# Patient Record
Sex: Female | Born: 1945
Health system: Southern US, Community
[De-identification: ages and names within clinical notes are randomized; demographics above are authoritative.]

## PROBLEM LIST (undated history)

## (undated) DIAGNOSIS — D229 Melanocytic nevi, unspecified: Secondary | ICD-10-CM

## (undated) DIAGNOSIS — M1990 Unspecified osteoarthritis, unspecified site: Secondary | ICD-10-CM

## (undated) DIAGNOSIS — E785 Hyperlipidemia, unspecified: Secondary | ICD-10-CM

## (undated) DIAGNOSIS — J302 Other seasonal allergic rhinitis: Secondary | ICD-10-CM

## (undated) DIAGNOSIS — F329 Major depressive disorder, single episode, unspecified: Secondary | ICD-10-CM

## (undated) DIAGNOSIS — I10 Essential (primary) hypertension: Secondary | ICD-10-CM

## (undated) DIAGNOSIS — F32A Depression, unspecified: Secondary | ICD-10-CM

## (undated) DIAGNOSIS — K219 Gastro-esophageal reflux disease without esophagitis: Secondary | ICD-10-CM

## (undated) DIAGNOSIS — M51369 Other intervertebral disc degeneration, lumbar region without mention of lumbar back pain or lower extremity pain: Secondary | ICD-10-CM

## (undated) DIAGNOSIS — Z0000 Encounter for general adult medical examination without abnormal findings: Secondary | ICD-10-CM

## (undated) DIAGNOSIS — Z8619 Personal history of other infectious and parasitic diseases: Secondary | ICD-10-CM

## (undated) DIAGNOSIS — M8580 Other specified disorders of bone density and structure, unspecified site: Secondary | ICD-10-CM

## (undated) DIAGNOSIS — Z86018 Personal history of other benign neoplasm: Secondary | ICD-10-CM

## (undated) DIAGNOSIS — R32 Unspecified urinary incontinence: Secondary | ICD-10-CM

## (undated) DIAGNOSIS — I2510 Atherosclerotic heart disease of native coronary artery without angina pectoris: Secondary | ICD-10-CM

## (undated) DIAGNOSIS — S3210XA Unspecified fracture of sacrum, initial encounter for closed fracture: Secondary | ICD-10-CM

## (undated) DIAGNOSIS — E782 Mixed hyperlipidemia: Secondary | ICD-10-CM

## (undated) DIAGNOSIS — N8110 Cystocele, unspecified: Secondary | ICD-10-CM

## (undated) DIAGNOSIS — B353 Tinea pedis: Secondary | ICD-10-CM

## (undated) DIAGNOSIS — Z8669 Personal history of other diseases of the nervous system and sense organs: Secondary | ICD-10-CM

## (undated) DIAGNOSIS — M25552 Pain in left hip: Secondary | ICD-10-CM

## (undated) DIAGNOSIS — E1169 Type 2 diabetes mellitus with other specified complication: Secondary | ICD-10-CM

## (undated) DIAGNOSIS — E669 Obesity, unspecified: Secondary | ICD-10-CM

## (undated) DIAGNOSIS — N993 Prolapse of vaginal vault after hysterectomy: Secondary | ICD-10-CM

## (undated) DIAGNOSIS — M5136 Other intervertebral disc degeneration, lumbar region: Secondary | ICD-10-CM

## (undated) HISTORY — DX: Gastro-esophageal reflux disease without esophagitis: K21.9

## (undated) HISTORY — DX: Pain in left hip: M25.552

## (undated) HISTORY — DX: Essential (primary) hypertension: I10

## (undated) HISTORY — DX: Encounter for general adult medical examination without abnormal findings: Z00.00

## (undated) HISTORY — DX: Other specified disorders of bone density and structure, unspecified site: M85.80

## (undated) HISTORY — DX: Hyperlipidemia, unspecified: E78.5

## (undated) HISTORY — DX: Tinea pedis: B35.3

## (undated) HISTORY — DX: Obesity, unspecified: E66.9

## (undated) HISTORY — DX: Depression, unspecified: F32.A

## (undated) HISTORY — DX: Other seasonal allergic rhinitis: J30.2

## (undated) HISTORY — DX: Major depressive disorder, single episode, unspecified: F32.9

## (undated) HISTORY — DX: Personal history of other infectious and parasitic diseases: Z86.19

## (undated) HISTORY — PX: BLADDER REPAIR: SHX76

## (undated) HISTORY — DX: Melanocytic nevi, unspecified: D22.9

## (undated) HISTORY — DX: Unspecified urinary incontinence: R32

## (undated) HISTORY — DX: Unspecified fracture of sacrum, initial encounter for closed fracture: S32.10XA

## (undated) HISTORY — DX: Type 2 diabetes mellitus with other specified complication: E11.69

## (undated) HISTORY — PX: TOTAL ABDOMINAL HYSTERECTOMY: SHX209

---

## 2000-05-12 LAB — HM COLONOSCOPY: HM Colonoscopy: NORMAL

## 2000-06-09 LAB — HM PAP SMEAR: HM Pap smear: NORMAL

## 2002-03-04 HISTORY — PX: TOTAL VAGINAL HYSTERECTOMY: SHX2548

## 2010-10-17 ENCOUNTER — Encounter: Payer: Self-pay | Admitting: Internal Medicine

## 2010-10-17 ENCOUNTER — Ambulatory Visit (INDEPENDENT_AMBULATORY_CARE_PROVIDER_SITE_OTHER): Payer: Medicare Other | Admitting: Internal Medicine

## 2010-10-17 ENCOUNTER — Ambulatory Visit: Payer: Self-pay | Admitting: Internal Medicine

## 2010-10-17 DIAGNOSIS — IMO0001 Reserved for inherently not codable concepts without codable children: Secondary | ICD-10-CM

## 2010-10-17 DIAGNOSIS — F411 Generalized anxiety disorder: Secondary | ICD-10-CM

## 2010-10-17 DIAGNOSIS — R635 Abnormal weight gain: Secondary | ICD-10-CM

## 2010-10-17 DIAGNOSIS — E669 Obesity, unspecified: Secondary | ICD-10-CM | POA: Insufficient documentation

## 2010-10-17 DIAGNOSIS — F419 Anxiety disorder, unspecified: Secondary | ICD-10-CM

## 2010-10-17 DIAGNOSIS — I10 Essential (primary) hypertension: Secondary | ICD-10-CM

## 2010-10-17 DIAGNOSIS — E785 Hyperlipidemia, unspecified: Secondary | ICD-10-CM

## 2010-10-17 DIAGNOSIS — N3289 Other specified disorders of bladder: Secondary | ICD-10-CM

## 2010-10-17 DIAGNOSIS — M791 Myalgia, unspecified site: Secondary | ICD-10-CM

## 2010-10-17 DIAGNOSIS — Z79899 Other long term (current) drug therapy: Secondary | ICD-10-CM

## 2010-10-17 DIAGNOSIS — Z1239 Encounter for other screening for malignant neoplasm of breast: Secondary | ICD-10-CM

## 2010-10-17 HISTORY — DX: Anxiety disorder, unspecified: F41.9

## 2010-10-17 HISTORY — DX: Obesity, unspecified: E66.9

## 2010-10-17 LAB — BASIC METABOLIC PANEL
CO2: 24 mEq/L (ref 19–32)
Chloride: 104 mEq/L (ref 96–112)
Creat: 0.73 mg/dL (ref 0.50–1.10)
Potassium: 4.3 mEq/L (ref 3.5–5.3)

## 2010-10-17 LAB — HEPATIC FUNCTION PANEL
Albumin: 4.3 g/dL (ref 3.5–5.2)
Indirect Bilirubin: 0.2 mg/dL (ref 0.0–0.9)
Total Protein: 7.2 g/dL (ref 6.0–8.3)

## 2010-10-17 LAB — URINALYSIS
Bilirubin Urine: NEGATIVE
Leukocytes, UA: NEGATIVE
Nitrite: NEGATIVE
Protein, ur: NEGATIVE mg/dL
Specific Gravity, Urine: 1.021 (ref 1.005–1.030)
Urobilinogen, UA: 0.2 mg/dL (ref 0.0–1.0)

## 2010-10-17 LAB — CBC
HCT: 41.8 % (ref 36.0–46.0)
MCV: 88.9 fL (ref 78.0–100.0)
Platelets: 328 10*3/uL (ref 150–400)
RBC: 4.7 MIL/uL (ref 3.87–5.11)
WBC: 3.8 10*3/uL — ABNORMAL LOW (ref 4.0–10.5)

## 2010-10-17 LAB — LIPID PANEL
LDL Cholesterol: 121 mg/dL — ABNORMAL HIGH (ref 0–99)
Total CHOL/HDL Ratio: 3 Ratio
Triglycerides: 78 mg/dL (ref <150)
VLDL: 16 mg/dL (ref 0–40)

## 2010-10-17 LAB — TSH: TSH: 1.001 u[IU]/mL (ref 0.350–4.500)

## 2010-10-17 MED ORDER — LOSARTAN POTASSIUM 50 MG PO TABS: 50.0000 mg | ORAL_TABLET | Freq: Every day | ORAL | Status: DC

## 2010-10-17 MED ORDER — FLUOXETINE HCL 40 MG PO CAPS: 40.0000 mg | ORAL_CAPSULE | Freq: Every day | ORAL | Status: DC

## 2010-10-17 NOTE — Assessment & Plan Note (Signed)
Change benicar to losartan for cost consideration. Maintain outpt blood pressure log. Obtain cbc,chem7.

## 2010-10-17 NOTE — Assessment & Plan Note (Signed)
Urology consult

## 2010-10-17 NOTE — Assessment & Plan Note (Signed)
Obtain lipid/lft. Due to myalgias obtain ck and hold statin for 1-2 weeks to determine if sx's related.

## 2010-10-17 NOTE — Assessment & Plan Note (Signed)
suboptimal control. Increase prozac 40mg  po qd.

## 2010-10-17 NOTE — Progress Notes (Signed)
  Subjective:    Patient ID: Brenda [name], female    DOB: [birth year], 65 y.o.   MRN: [id number]  HPI Pt presents to clinic to establish primary care and for followup of multiple medical problems. H/o HTN typically well controlled. Finds benicar expensive. S/p bladder tack at the time of hysterectomy and notes chronic bladder pressure and discomfort. Wonders if bladder has fallen but notes no prolapse. H/o depression/anxiety maintained on low dose prozac without side effects. Notes somewhat regular anxiety despite medication. Compliant with statin therapy however notes leg myalgias and cramping. +unintended weight gain. GERD sx's occur primarily with food triggers and takes rare zantac prn. No other complaints.  Reviewed pmh, psh, medications, allergies, soc hx and fam hx.    Review of Systems  Constitutional: Positive for unexpected weight change. Negative for fatigue.  Eyes: Negative for discharge and visual disturbance.  Respiratory: Negative for cough and shortness of breath.   Cardiovascular: Negative for chest pain.  Gastrointestinal: Negative for abdominal pain.  Genitourinary: Negative for dysuria, difficulty urinating and pelvic pain.  Musculoskeletal: Positive for myalgias.  Skin: Negative for color change and rash.  Neurological: Negative for seizures and speech difficulty.  Psychiatric/Behavioral: The patient is nervous/anxious.        Objective:   Physical Exam  Nursing note and vitals reviewed. Constitutional: She appears well-developed and well-nourished. No distress.  HENT:  Head: Normocephalic and atraumatic.  Right Ear: External ear normal.  Left Ear: External ear normal.  Nose: Nose normal.  Mouth/Throat: Oropharynx is clear and moist. No oropharyngeal exudate.  Eyes: Conjunctivae are normal. Right eye exhibits no discharge. Left eye exhibits no discharge. No scleral icterus.  Neck: Neck supple.  Cardiovascular: Normal rate, regular rhythm and normal heart sounds.   Exam reveals no gallop and no friction rub.   No murmur heard. Pulmonary/Chest: Effort normal and breath sounds normal. No respiratory distress. She has no wheezes. She has no rales.  Lymphadenopathy:    She has no cervical adenopathy.  Neurological: She is alert.  Skin: Skin is warm and dry. She is not diaphoretic.  Psychiatric: She has a normal mood and affect.          Assessment & Plan:

## 2010-10-17 NOTE — Assessment & Plan Note (Signed)
Obtain tsh  

## 2010-11-21 ENCOUNTER — Encounter: Payer: Self-pay | Admitting: Internal Medicine

## 2010-11-21 LAB — HM MAMMOGRAPHY

## 2010-11-29 ENCOUNTER — Encounter: Payer: Self-pay | Admitting: Internal Medicine

## 2011-01-16 ENCOUNTER — Encounter: Payer: Self-pay | Admitting: Internal Medicine

## 2011-01-16 ENCOUNTER — Ambulatory Visit (INDEPENDENT_AMBULATORY_CARE_PROVIDER_SITE_OTHER): Payer: Medicare Other | Admitting: Internal Medicine

## 2011-01-16 VITALS — BP 130/80 | HR 83 | Temp 98.1°F | Resp 18 | Ht 68.0 in | Wt 180.0 lb

## 2011-01-16 DIAGNOSIS — E785 Hyperlipidemia, unspecified: Secondary | ICD-10-CM

## 2011-01-16 DIAGNOSIS — Z23 Encounter for immunization: Secondary | ICD-10-CM

## 2011-01-16 DIAGNOSIS — I10 Essential (primary) hypertension: Secondary | ICD-10-CM

## 2011-01-16 NOTE — Progress Notes (Signed)
  Subjective:    Patient ID: Brenda [name], female    DOB: 16-Mar-1945, 65 y.o.   MRN: [id number]  HPI Pt presents to clinic for followup of multiple medical problems. BP reviewed as normotensive. Tolerates statin tx without myalgias. S/p urology for evaluation of cystocele. Attempting to avoid surgery and is improved. Wt down 3lbs and attempting to lose further.  Past Medical History  Diagnosis Date  . History of chicken pox     childhood age 15  . Depression     counseling  . GERD (gastroesophageal reflux disease)   . Seasonal allergies   . Hypertension   . Hyperlipidemia     2011   Past Surgical History  Procedure Date  . Total abdominal hysterectomy     2002  . Bladder repair     2002    reports that she has quit smoking. She has never used smokeless tobacco. She reports that she does not drink alcohol or use illicit drugs. family history includes Breast cancer (age of onset:40) in her sister; Colon cancer (age of onset:50) in her sister; Heart disease (age of onset:32) in her father; and Hypertension in her father. No Known Allergies   Review of Systems see hpi     Objective:   Physical Exam  Physical Exam  Nursing note and vitals reviewed. Constitutional: Appears well-developed and well-nourished. No distress.  HENT:  Head: Normocephalic and atraumatic.  Right Ear: External ear normal.  Left Ear: External ear normal.  Eyes: Conjunctivae are normal. No scleral icterus.  Neck: Neck supple. Carotid bruit is not present.  Cardiovascular: Normal rate, regular rhythm and normal heart sounds.  Exam reveals no gallop and no friction rub.   No murmur heard. Pulmonary/Chest: Effort normal and breath sounds normal. No respiratory distress. He has no wheezes. no rales.  Lymphadenopathy:    He has no cervical adenopathy.  Neurological:Alert.  Skin: Skin is warm and dry. Not diaphoretic.  Psychiatric: Has a normal mood and affect.        Assessment & Plan:

## 2011-01-16 NOTE — Assessment & Plan Note (Signed)
Stable. Obtain lipid/lft prior to next visit. Continue statin tx.

## 2011-01-16 NOTE — Patient Instructions (Signed)
Please schedule [id number] and lipid/lft 272.4 prior to next visit 

## 2011-01-16 NOTE — Assessment & Plan Note (Signed)
Normotensive and stable. Continue current regimen. Monitor bp as outpt and followup in clinic as scheduled.  

## 2011-01-17 ENCOUNTER — Encounter: Payer: Self-pay | Admitting: Internal Medicine

## 2011-01-23 ENCOUNTER — Telehealth: Payer: Self-pay | Admitting: Internal Medicine

## 2011-01-23 MED ORDER — HYDROCHLOROTHIAZIDE 25 MG PO TABS: 25.0000 mg | ORAL_TABLET | Freq: Every day | ORAL | Status: DC

## 2011-01-23 NOTE — Telephone Encounter (Signed)
Refill- hydrochlorothi 25mg  tab. Take one tablet by mouth every day. Qty 30. Last fill 10.26.12

## 2011-01-23 NOTE — Telephone Encounter (Signed)
Rx refill sent to pharmacy. 

## 2011-02-11 ENCOUNTER — Telehealth: Payer: Self-pay | Admitting: *Deleted

## 2011-02-11 MED ORDER — PRAVASTATIN SODIUM 20 MG PO TABS: 20.0000 mg | ORAL_TABLET | Freq: Every day | ORAL | Status: DC

## 2011-02-11 NOTE — Telephone Encounter (Signed)
Received message from pt stating pharmacy received denial from [place] for pravastatin and pt is wanting to know why? Requests refill to [facility]. After review of chart I do not see a request from [facility]. Refill has been sent to pharmacy and message was left for pt to return my call.

## 2011-02-12 NOTE — Telephone Encounter (Signed)
Pt.notified

## 2011-04-09 ENCOUNTER — Telehealth: Payer: Self-pay | Admitting: *Deleted

## 2011-04-09 DIAGNOSIS — Z79899 Other long term (current) drug therapy: Secondary | ICD-10-CM

## 2011-04-09 DIAGNOSIS — E785 Hyperlipidemia, unspecified: Secondary | ICD-10-CM

## 2011-04-09 LAB — BASIC METABOLIC PANEL
BUN: 26 mg/dL — ABNORMAL HIGH (ref 6–23)
Calcium: 10.1 mg/dL (ref 8.4–10.5)
Creat: 0.85 mg/dL (ref 0.50–1.10)
Glucose, Bld: 91 mg/dL (ref 70–99)

## 2011-04-09 LAB — HEPATIC FUNCTION PANEL
ALT: 12 U/L (ref 0–35)
AST: 16 U/L (ref 0–37)
Alkaline Phosphatase: 62 U/L (ref 39–117)
Bilirubin, Direct: 0.1 mg/dL (ref 0.0–0.3)
Indirect Bilirubin: 0.4 mg/dL (ref 0.0–0.9)

## 2011-04-09 LAB — LIPID PANEL
Cholesterol: 194 mg/dL (ref 0–200)
Triglycerides: 49 mg/dL (ref <150)

## 2011-04-09 NOTE — Telephone Encounter (Signed)
Pt presented to the lab for blood work. Lab orders placed and forwarded to the lab per office note of 01/2011.

## 2011-04-17 ENCOUNTER — Ambulatory Visit (INDEPENDENT_AMBULATORY_CARE_PROVIDER_SITE_OTHER): Payer: Medicare Other | Admitting: Internal Medicine

## 2011-04-17 ENCOUNTER — Telehealth: Payer: Self-pay | Admitting: Internal Medicine

## 2011-04-17 ENCOUNTER — Encounter: Payer: Self-pay | Admitting: Internal Medicine

## 2011-04-17 VITALS — BP 130/80 | HR 67 | Temp 98.1°F | Resp 18 | Ht 68.0 in | Wt 188.0 lb

## 2011-04-17 DIAGNOSIS — Z1211 Encounter for screening for malignant neoplasm of colon: Secondary | ICD-10-CM

## 2011-04-17 DIAGNOSIS — I10 Essential (primary) hypertension: Secondary | ICD-10-CM

## 2011-04-17 DIAGNOSIS — E785 Hyperlipidemia, unspecified: Secondary | ICD-10-CM

## 2011-04-17 DIAGNOSIS — Z23 Encounter for immunization: Secondary | ICD-10-CM

## 2011-04-17 HISTORY — DX: Encounter for screening for malignant neoplasm of colon: Z12.11

## 2011-04-17 NOTE — Progress Notes (Signed)
  Subjective:    Patient ID: Brenda [name], female    DOB: 1945-05-23, 66 y.o.   MRN: [id number]  HPI Pt presents to clinic for followup of multiple medical problems. Last colonoscopy 2002 reviewed. BP reviewed normotensive. Tolerating statin tx without myalgias or abn lfts. Needs tetanus booster. No active complaint.  Past Medical History  Diagnosis Date  . History of chicken pox     childhood age 1  . Depression     counseling  . GERD (gastroesophageal reflux disease)   . Seasonal allergies   . Hypertension   . Hyperlipidemia     2011   Past Surgical History  Procedure Date  . Total abdominal hysterectomy     2002  . Bladder repair     2002    reports that she has quit smoking. She has never used smokeless tobacco. She reports that she does not drink alcohol or use illicit drugs. family history includes Breast cancer (age of onset:40) in her sister; Colon cancer (age of onset:50) in her sister; Heart disease (age of onset:32) in her father; and Hypertension in her father. No Known Allergies    Review of Systems see hpi     Objective:   Physical Exam  Physical Exam  Nursing note and vitals reviewed. Constitutional: Appears well-developed and well-nourished. No distress.  HENT:  Head: Normocephalic and atraumatic.  Right Ear: External ear normal.  Left Ear: External ear normal.  Eyes: Conjunctivae are normal. No scleral icterus.  Neck: Neck supple. Carotid bruit is not present.  Cardiovascular: Normal rate, regular rhythm and normal heart sounds.  Exam reveals no gallop and no friction rub.   No murmur heard. Pulmonary/Chest: Effort normal and breath sounds normal. No respiratory distress. He has no wheezes. no rales.  Lymphadenopathy:    He has no cervical adenopathy.  Neurological:Alert.  Skin: Skin is warm and dry. Not diaphoretic.  Psychiatric: Has a normal mood and affect.        Assessment & Plan:

## 2011-04-17 NOTE — Assessment & Plan Note (Signed)
Normotensive and stable. Continue current regimen. Monitor bp as outpt and followup in clinic as scheduled.  

## 2011-04-17 NOTE — Assessment & Plan Note (Signed)
GI referral for f/u colonoscopy

## 2011-04-17 NOTE — Assessment & Plan Note (Signed)
Stable. Continue current statin dosing.  

## 2011-04-17 NOTE — Patient Instructions (Signed)
Please schedule [id number] and lipid/lft 272.4 prior to next visit 

## 2011-04-18 NOTE — Patient Instructions (Addendum)
____________________________________________________________________________________________________________________________________________________________   Brenda [name] need to arrive at the 4th Floor [facility] at aaaa on ccccc, xxxxx for your procedure(s). That is one hour before the procedure is scheduled to start to allow ample time for check in   The following packet contains information regarding your prep instructions, what to expect on the day of your procedure, follow up, cancellation policy, finances of your procedure, rights and responsibilities as a patient, complaint/grievance process, advance directive,  and a list of frequently asked questions.  ________________________________________________________________________________________________________________________________________________________________________________________________  MOVIPREP INSTRUCTIONS  THIS IS A SPLIT-DOSE PREP YOU WILL NOT BE DRINKING ALL THE PREP AT SAME TIME   FOLLOW THESE INSTRUCTIONS, NOT INSTRUCTIONS ON MOVIPREP BOX.    STARTING FIVE DAYS BEFORE YOUR PROCEDURE (sss, ssss) Do not eat nuts, seeds, popcorn, corn, beans, peas, salads, or any raw vegetables. Do not take any fiber supplements (e.g. Metamucil, Citrucel, and Benefiber).   THE DAY BEFORE YOUR PROCEDURE (xxxx, xx) In the morning, mix the first half of the prep solution  Empty 1 Pouch A and 1 Pouch B into the disposable container.  Add lukewarm drinking water to the top line of the container. Mix to dissolve.  Refrigerate (mixed solution should be used within 24 hours)  Drink clear liquids the entire day - YOU SHOULD NOT EAT ANY SOLID FOOD.  Do not drink anything colored red or purple. Avoid juices with pulp. No orange juice.  Drink at least 64 oz. (8 glasses) of fluid/clear liquids during the day to prevent dehydration and help the prep work best.  Clear liquids include: water, ice, tea/coffee (sugar is ok, but no milk or  cream), juice (apple, white grape, white cranberry), clear bullion, consomme, broth, strained chicken noodle soup, jello, popsicles, powdered fruit flavored drinks, gatorade, lemonade, carbonated beverages, hard candy.  Start drinking the first half of the prep solution at 5pm. The MoviPrep container is divided by 4 marks. Every 15 minutes drink the solution down to the next mark (approximately 8 oz) until the full liter is completely gone.  After you drink the prep solution, wait another 15 minutes and then drink 16 oz clear liquid of your choice (nothing red or purple). Continue to drink clear liquids as needed until bedtime.  Before going to bed, mix the second half dose of prep solution  Empty 1 Pouch A and 1 Pouch B into the disposable container.  Add lukewarm drinking water to the top line of the container. Mix to dissolve.  Refrigerate   THE DAY OF YOUR PROCEDURE (xx, xx) Start drinking the second half of the prep at  xx (5 hours prior to the start of your procedure). Every 15 minutes drink the solution down to the next mark (approximately 8 oz) until the full liter is completely gone.  After you drink the prep solution, wait another 15 minutes and then drink 16 oz clear liquid of your choice (nothing red or purple). You may drink clear liquids until cc (which is 2 hours prior to start of your procedure).     ________________________________________________________________________________________________________________________________________________________________________________________________________________________   OTHER PROCEDURE INSTRUCTIONS  MEDICATIONS    Unless you are otherwise instructed, you should take your regular prescription medications with a small sip of water as early as possibly on the morning of your procedure. Diabetes medications: cc Blood thinning, anticoagulant medications: cc Additional medications: cc  CARE PARTNER   You will need a responsible  adult at least 66 years of age to act as your care partner on the day of  your procedure. This person needs to arrive with you to the facility, stay there during your procedure and then drive you home afterwards.  We cannot start your procedure unless your care partner is present in our facility. The total time from sign in until discharge is approximately 2-3 hours.  Before you leave, your doctor will review the findings and recommendation with you (and your care partner, if you give permission).  WHAT TO WEAR/BRING   Wear loose fitting clothing that is easily removed. Leave jewelry and other valuables at home. However, you may wish to bring a book to read or an iPod/MP3 player to listen to music as you wait for your procedure to start. Remove all body piercing jewelry and leave at home.  You should not wear any red or dark colored fingernail polish.  WHAT TO EXPECT AFTER THE PROCEDURE   Some feelings of bloating in the abdomen. Passage of more gas than usual.  Walking can help get rid of the air that was put into your GI tract during the procedure and reduce the bloating. You may notice spotting of blood in your stool or on the toilet paper. Since you completed a bowel prep for your procedure, then you may not have a normal bowel movement for a few days.  DIET   In general your first meal following the procedure should be a light meal and then it is ok to progress to your normal diet.  A half-sandwich or bowl of soup is an example of a good first meal.  Heavy or fried foods are harder to digest and may make you feel nauseous or bloated.  Drink plenty of fluids but you should avoid alcoholic beverages for 24 hours. These diet instructions may be modified depending on the results of your procedure.  ACTIVITY   Your care partner should take you home directly after the procedure.  You should plan to take it easy, moving slowly for the rest of the day.  You can resume normal activity the day after the  procedure however you should NOT DRIVE or use heavy machinery for 24 hours (because of the sedation medicines used during the procedure).    FOLLOW UP AFTER YOUR PROCEDURE If any biopsies are taken you will be contacted by phone or by letter within the next 1-3 weeks.  Call your gastroenterologist if you have not heard about the biopsies in 3 weeks.  The next business day following your procedure, our staff will call the home number listed on your records to check on you and address any questions or concerns that you may have. This is a courtesy call.  If there is no answer at the home number and we have not heard from you through the emergency physician on call, we will assume that you have returned to your regular daily activities without incident.   If there is no answer and you are not identified on your recording, privacy regulations prevent [place] from leaving a message.   ________________________________________________________________________________________________________________________________________________________________________________________________________________________   CANCELLATION POLICY  We need ample time to take care of all of our patients and so we require 2 full business days notice for non-emergent cancellation of a procedure.  Failure to give 2 full days notice may result in a fee:  $100 for a single procedure (upper or lower endoscopy) $200 for a double procedure (upper and lower endoscopy)  If the day of your procedure is:         You need to notify [facility]  by 5pm on: Monday         Wednesday Tuesday         Thursday Wednesday         Friday Thursday         Monday Friday          Tuesday   ________________________________________________________________________________________________________________________________________________________________________________________________________________________   YOUR FINANCIAL RESPONSIBILITY  If you have insurance,  you will need to contact your insurance company to verify that you have active coverage and to determine the amount of coverage they will provide. It is important to tell them that you are having the procedure performed at an [facility]). They can tell you what portion of the cost will be your responsibility, usually expressed either as a fixed amount or as a percentage of overall cost. We will also be contacting your insurance company with information about your procedure in order to obtain pre-certification.  You must contact your insurance company as well, failure to do so may result in you having to pay a greater portion of the cost or even the total cost of the procedure.  YOU MAY RECEIVE THE FOLLOWING BILLS 1. The [facility] will bill a facility fee for use of the procedure room, medication and supplies. 2. Your [facility] gastroenterologist will bill a professional fee for performing the procedure 3. If a biopsy is performed you will receive a bill from [facility] for their professional fee and a bill from [facility] for processing the pathology sample 4. If you receive CRNA supervised diprivan for sedation during your procedure, you will receive a bill from that provider [facility])   Complaints or questions regarding billing, payment by third party payers or payment plans can be directed to the Customer Service Department of Professional Fee Billing Services of the [facility], [address], [place], [place] [address].  Phone inquiries may be made at [phone], Monday through Friday 8am to 5 p.m., or you may visit the [facility] website at: [email or web address], click on "For Patients", then click "Questions about your  bill.  ___________________________________________________________________________________________________________________________________________________________________________________________________________________________   [name] ([facility])                                The [facility] ([facility]) is an independent, freestanding Ambulatory Surgery Center (ASC) located on the fourth floor of the [facility] at [address] [place], [place].  It is licensed by the [place], certified by [employer] and is accredited by [employer].   The [facility] physicians established the [facility] in 1992. The physicians of [facility] joined with the [facility]) in 1999, and the [facility] is now owned by [facility].  We completed a major renovation in 2006 and the expanded [facility] now provides greater privacy and comfort for our patients and their family members.  We have invested in state-of-the-art facilities and equipment to ensure that our patients receive the most up to date and best care.  At the [facility], board-certified gastroenterologists perform elective diagnostic and therapeutic endoscopic procedures such as endoscopy and colonoscopy.  Hours of operation are from 7:30 a.m. to 6 p.m. Monday - Friday.  Procedures may be scheduled by calling [phone]. Outside of the posted hours of operation, urgent or emergent care is provided at [facility] and [facility].  Our physicians also provide 24-hour  emergency coverage.  The on-call physician may be reached by calling the answering service: [phone].  ________________________________________________________________________________________________________________________________________________________________________________________________________________________   Brenda [name] AS A PATIENT         Considerate,  respectful, and safe care.  A discussion of your condition or illness, what we can do about it, and the likely outcome of care.  To know the names and the roles of people caring for you.  You can consent or refuse any treatment within the law throughout your stay.  The [facility] staff and doctors will protect your privacy as much as possible.  Your health records are confidential unless you give permission for [place] to release them or law requires them to be released.  When the [facility] releases your records to others, like your insurance company, we ask them to maintain confidentiality. You can review your records and ask questions about them unless restricted by law.  You can expect that the staff will give you needed health care to the best of their ability.  Treatment, referral, or transfer may be recommended.  If a transfer is needed, you will be told of the risks, benefits and other options.  You have a right to know if the L[facility] has relationships with outside parties that may influence your treatment or care.  These could be with educational facilities, insurers, or other health care givers.    You may consent or decline to take part in research.  If you decline, we will still provide you with the very best care.  You have the right to know about [facility]EC rules that affect you, your care, charges and payment methods.  You have the right to know about LEC resources, such as the complaint process, that can help you resolve problems and questions about your stay and care.  ________________________________________________________________________________________________________________________________________________________________________________________________________________________     YOUR RESPONSIBILITIES AS A PATIENT   You are responsible for giving [place] information about your health, such as past sickness, hospital stays and medications.  You are to ask questions when you do not understand  information or instructions.  If you cannot follow through with your treatment, you must tell your doctor or nurse.  You and your visitors are responsible for being considerate of the needs of other patients, staff and the center.  You are responsible to provide information for insurance purposes and work with our billing staff to arrange for payment when necessary.  Your health depends on the decisions you make in your daily life.  You are responsible for recognizing the effect of your life style on your health.  You are asked to share your values, beliefs and traditions that help the staff in providing care that respects your values and dignity.  ________________________________________________________________________________________________________________________________________________________________________________________________________________________   COMPLAINT/GRIEVANCE PROCESS   The LEC recognizes that patients have the right to voice concerns without fear of discrimination or reprisal, and to have these concerns reviewed and responded to in a timely manner. LEC seeks to provide prompt review and timely resolution of complaints or grievances from any patient.    You may voice your concerns, complaints, or problems with the care you have received or are receiving to any staff member at any point in your care.  Every effort will be made to reconcile your concern or complaint while you are still in the L[facility].  However, if you wish to voice a concern or complaint after you have left the center, you may contact the Nursing Supervisor at [phone] or our Administrative Director of Gastroenterology at  [phone].  If we are unable to satisfactorily address your complaint, you may contact the [facility] Department of [facility], Complaint Intake Unit ([address] [place], [address] [place], [place], [place] [address], phone: [phone] or [phone])  [email or web address].    You may also contact the Office of the Medicare Ombudsman to file a grievance at 800 MEDICARE (800 302-754-9257) or at https://rivas-williams.biz/.asp  ________________________________________________________________________________________________________________________________________________________________________________________________________________________    ADVANCE DIRECTIVE POLICY   The L[facility] supports the adult patient's right to make decisions regarding the acceptance or refusal of medical and/or surgical treatments and recognizes Advance Directives as options to promote patient autonomy regarding treatment decisions.   However, due to the lack of a constant care relationship in the ambulatory care setting, if a patient should suffer a cardiac or respiratory arrest or other life-threatening situation, you and/or your healthcare power of attorney will be required to sign a form which implies consent for resuscitation and transfer to a higher level of care.  Therefore, the [facility] will not honor previously signed advance directives or verbal family agreements for any patient.  Should you not agree with the Center's policy on Advance Directives and decline to sign the form, your procedure cannot be performed in the [facility].  However the procedure may be scheduled in the hospital setting and performed by a gastroenterologist affiliated with [facility].   For applicable state laws and sample forms, you may contact the [employer] at [phone] for English and [phone] for other languages or via the web at MentalTracker.com.cy.  Other sources include the [place] Department of Health and [employer] Division of Aging and Adult Services 800 (820)122-8218 at http://cohen-reilly.biz/ or www.[facility].org at (616)225-4055 or  www.nclifelinks.org or  [email or web address].   ________________________________________________________________________________________________________________________________________________________________________________________________________________________    FREQUENTLY ASKED QUESTIONS  WHEN DO I START THE PREP? Please refer to your personalized prep instructions regarding the start time.  We realize, however, that you may work until later than this time. If so, you can begin taking your preparation as soon as you get home.  But realize that the later you start, the later you'll be up going to the bathroom. HOW CAN I IMPROVE THE TASTE OF THE PREP? All the solutions have a salty aftertaste. You can try any one or all of these suggestions to improve or overcome this taste: . Hold hard candy in your mouth while drinking the solution. Almeta Monas each glass with swallows of another beverage (juice, Coke, etc.). Grier Rocher on a Popsicle or sucker while drinking the solution. Dorna Bloom flavored gum while drinking the solution. WHAT IF I GET SICK DURING THE PREP?  Stop drinking the solution and wait for 30-45 minutes. Let your system settle down. Try drinking small sips of Coke or other beverage. If you received a prescription for Reglan/Metoclopramide and you haven't already taken the second pill, take it now as it decreases nausea and empties the stomach faster. Begin the solution again, using some of the suggestions above if the flavor is the problem.  WHEN WILL THE PREP START TO WORK?  Everyone is different in the amount of time that it takes the purgative (laxative) to work. Some people begin to stool in the first hour, others not until the fourth hour or later. Activity is helpful in stimulating the bowel so, if possible, do not sit and wait for the bowel to act - remain active.  DO I HAVE TO DRINK ALL OF THE PREP?  Yes.  In  order to give you the best examination possible, it is important to drink all of the  solution in the set amount of time.  Some of the preps are "split dose," and so are designed to work best if you drink the prep in two sittings.  Your personalized intructions above will explain that if full detail. In the event that you have tried everything suggested and still cannot complete the preparation, please call [phone] during business hours.  Call [phone] or [phone] to reach the on call physician after hours or on weekends. WHAT TYPES OF SEDATION ARE USED AT [facility]? There are two types of sedation.  Your gastroenterologist will decide which type you will need based on your personal medical issues as well as their own preference.  Moderate Sedation is achieved using a combination of an IV narcotic (fentanyl, Sublimaze) and an IV anxiolytic (midazolam, Versed). This results in a depressed state of consciousness which will allow you to be very relaxed and comfortable during the procedure but still able to respond to stimuli if needed.  You should not remember the procedure and will likely not remember the discharge instructions or discussion with the MD after the test is over.  You cannot drive or operate heavy machinery for 24 hours following the procedure.  You will not receive a separate bill for this type of sedation.  Occasionally this type of sedation is less effective than deep sedation for patients on certain chronic medications (pain medicines, antidepressants, antianxiety medicines) or in patients with a history of chronic alcohol use.  Deep Sedation (Monitored Anesthesia Care) is achieved using a short acting IV anesthetic (diprivan, Propofol) that promotes relaxation and sleep.  This medicine is administered by a CRNA (nurse anesthetist) skilled and credentialed in using diprovan.  You should not remember the procedure, but should be able to remember the discharge instructions, discussion with your MD after the test is over. You cannot drive or operate heavy machinery for 24  hours following the procedure.  You will receive a separate bill for this type of sedation.  This type of sedation is generally more reliable for patients who take certain chronic medications or with certain medical conditions and so it may be preferred.  If you have any questions about the type of sedation that will be used for your procedure, your gastroenterologist will be happy to discuss it with you. WHY DOES MY CARE PARTNER HAVE TO BE IN THE [facility] DURING MY TEST? Endoscopic procedures are generally safe, but due to the risk of possible complications associated with the procedure and anesthesia it is our policy that someone be present during the procedure who will act as your spokesperson should the need arise for emergency intervention.  We cannot start the procedure unless your care partner is present in the facility waiting room. WHY DO I NEED A DRIVER?  The medicines used for your sedation cause delayed reflexes, impair thinking and judgment, and have some amnesic effect, therefore affecting your ability to drive safely. Even though you may feel alright, you are instructed to refrain from driving, operating any type of machinery, making any critical decisions, or signing any legal documents for 24 hours following your procedure.  WHAT SHOULD I BRING WITH ME?  You will need to bring your insurance card(s) with you. Leave jewelry, purses and wallets at home. Wear loose fitting, easily removed clothing (e.g. no pantyhose or girdles).  CAN I WEAR DENTURES?  Yes, you may wear  your dentures. However, you may be asked to remove them prior to your procedure.  CAN I  WEAR MY CONTACTS?  We advise that you leave your contact lenses at home and wear your glasses instead. If you do wear you lenses, you may be asked to remove them prior to your procedure so please bring a case for them and also a pair of glasses to wear after your procedure.  IS THE TEST SAFE DURING MY MENSTRUAL PERIOD?  Yes, your procedure  can still be performed.  WILL THE DOCTOR TALK WITH ME AFTERWARDS?

## 2011-04-19 ENCOUNTER — Encounter: Payer: Medicare Other | Admitting: *Deleted

## 2011-04-22 ENCOUNTER — Encounter: Payer: Self-pay | Admitting: Internal Medicine

## 2011-04-22 ENCOUNTER — Ambulatory Visit (AMBULATORY_SURGERY_CENTER): Payer: Medicare Other

## 2011-04-22 VITALS — Ht 68.0 in | Wt 188.5 lb

## 2011-04-22 DIAGNOSIS — Z1211 Encounter for screening for malignant neoplasm of colon: Secondary | ICD-10-CM

## 2011-04-22 DIAGNOSIS — Z800 Family history of malignant neoplasm of digestive organs: Secondary | ICD-10-CM

## 2011-04-22 MED ORDER — PEG-KCL-NACL-NASULF-NA ASC-C 100 G PO SOLR: 1.0000 | Freq: Once | ORAL | Status: DC

## 2011-04-24 ENCOUNTER — Telehealth: Payer: Self-pay | Admitting: Internal Medicine

## 2011-04-24 NOTE — Telephone Encounter (Signed)
After talking with patient, she agrees to use MoviPrep as planned. Ezra Sites

## 2011-05-02 ENCOUNTER — Ambulatory Visit (AMBULATORY_SURGERY_CENTER): Payer: Medicare Other | Admitting: Internal Medicine

## 2011-05-02 ENCOUNTER — Encounter: Payer: Self-pay | Admitting: Internal Medicine

## 2011-05-02 DIAGNOSIS — Z800 Family history of malignant neoplasm of digestive organs: Secondary | ICD-10-CM

## 2011-05-02 DIAGNOSIS — D126 Benign neoplasm of colon, unspecified: Secondary | ICD-10-CM

## 2011-05-02 DIAGNOSIS — Z1211 Encounter for screening for malignant neoplasm of colon: Secondary | ICD-10-CM

## 2011-05-02 MED ORDER — SODIUM CHLORIDE 0.9 % IV SOLN: 500.0000 mL | INTRAVENOUS | Status: DC

## 2011-05-02 NOTE — Op Note (Signed)
[facility] [address] [place], [place]  [address]  COLONOSCOPY PROCEDURE REPORT  PATIENT:  Jemma, Rasp  MR#:  [id number] BIRTHDATE:  1945-04-25, 66 yrs. old  GENDER:  female ENDOSCOPIST:  Carie Caddy. Nieves Chapa, MD REF. BY:  Charlynn Court, M.D. PROCEDURE DATE:  05/02/2011 PROCEDURE:  Colonoscopy with snare polypectomy ASA CLASS:  Class II INDICATIONS:  Elevated Risk Screening (sister with colon cancer at age 65) MEDICATIONS:   These medications were titrated to patient response per physician's verbal order, Versed 10 mg IV, Fentanyl 100 mcg IV  DESCRIPTION OF PROCEDURE:   After the risks benefits and alternatives of the procedure were thoroughly explained, informed consent was obtained.  Digital rectal exam was performed and revealed perianal skin tags and moderate external hemorrhoids. The LB 180AL [id number] endoscope was introduced through the anus and advanced to the cecum, which was identified by both the appendix and ileocecal valve, without limitations.  The quality of the prep was excellent, using MoviPrep.  The instrument was then slowly withdrawn as the colon was fully examined. <<PROCEDUREIMAGES>> FINDINGS:  A 5 mm flat polyp was found in the descending colon. Polyp was snared without cautery. Retrieval was successful. Mild diverticulosis was found in the left colon.  Internal and external hemorrhoids were found.   Retroflexed views in the rectum revealed no other findings other than those already described.  The scope was then withdrawn from the cecum and the procedure completed.  COMPLICATIONS:  None  ENDOSCOPIC IMPRESSION: 1) Flat polyp in the descending colon.  Removed and sent to pathology. 2) Mild diverticulosis in the left colon 3) Internal and external hemorrhoids  RECOMMENDATIONS: 1) Await pathology results 2) High fiber diet. 3) Given your significant family history of colon cancer, you should have a repeat colonoscopy in 5 years 4) You will  receive a letter within 1-2 weeks with the results of your biopsy as well as final recommendations. Please call my office if you have not received a letter after 3 weeks.  Carie Caddy. Rhea Belton, MD  CC:  Charlynn Court MD The Patient  n. eSIGNEDCarie Caddy. Brendolyn Stockley at 05/02/2011 09:57 AM  Malachy Mood, [phone]

## 2011-05-02 NOTE — Progress Notes (Signed)
Patient did not have preoperative order for IV antibiotic SSI prophylaxis. [phone]) Patient did not experience any of the following events: a burn prior to discharge; a fall within the facility; wrong site/side/patient/procedure/implant event; or a hospital transfer or hospital admission upon discharge from the facility. [phone])  Pt. Given information sheets re: hemorrhoids, diverticulosis, high fiber diet and polyps  prior to discharge.

## 2011-05-02 NOTE — Patient Instructions (Signed)
YOU HAD AN ENDOSCOPIC PROCEDURE TODAY AT THE [facility]: Refer to the procedure report that was given to you for any specific questions about what was found during the examination.  If the procedure report does not answer your questions, please call your gastroenterologist to clarify.  If you requested that your care partner not be given the details of your procedure findings, then the procedure report has been included in a sealed envelope for you to review at your convenience later.  YOU SHOULD EXPECT: Some feelings of bloating in the abdomen. Passage of more gas than usual.  Walking can help get rid of the air that was put into your GI tract during the procedure and reduce the bloating. If you had a lower endoscopy (such as a colonoscopy or flexible sigmoidoscopy) you may notice spotting of blood in your stool or on the toilet paper. If you underwent a bowel prep for your procedure, then you may not have a normal bowel movement for a few days.  DIET: Your first meal following the procedure should be a light meal and then it is ok to progress to your normal diet.  A half-sandwich or bowl of soup is an example of a good first meal.  Heavy or fried foods are harder to digest and may make you feel nauseous or bloated.  Likewise meals heavy in dairy and vegetables can cause extra gas to form and this can also increase the bloating.  Drink plenty of fluids but you should avoid alcoholic beverages for 24 hours.  ACTIVITY: Your care partner should take you home directly after the procedure.  You should plan to take it easy, moving slowly for the rest of the day.  You can resume normal activity the day after the procedure however you should NOT DRIVE or use heavy machinery for 24 hours (because of the sedation medicines used during the test).    SYMPTOMS TO REPORT IMMEDIATELY: A gastroenterologist can be reached at any hour.  During normal business hours, 8:30 AM to 5:00 PM Monday through Friday,  call [phone].  After hours and on weekends, please call the GI answering service at [phone] who will take a message and have the physician on call contact you.   Following lower endoscopy (colonoscopy or flexible sigmoidoscopy):  Excessive amounts of blood in the stool  Significant tenderness or worsening of abdominal pains  Swelling of the abdomen that is new, acute  Fever of 100F or higher  Following upper endoscopy (EGD)  Vomiting of blood or coffee ground material  New chest pain or pain under the shoulder blades  Painful or persistently difficult swallowing  New shortness of breath  Fever of 100F or higher  Black, tarry-looking stools  FOLLOW UP: If any biopsies were taken you will be contacted by phone or by letter within the next 1-3 weeks.  Call your gastroenterologist if you have not heard about the biopsies in 3 weeks.  Our staff will call the home number listed on your records the next business day following your procedure to check on you and address any questions or concerns that you may have at that time regarding the information given to you following your procedure. This is a courtesy call and so if there is no answer at the home number and we have not heard from you through the emergency physician on call, we will assume that you have returned to your regular daily activities without incident.  SIGNATURES/CONFIDENTIALITY: You and/or your care   partner have signed paperwork which will be entered into your electronic medical record.  These signatures attest to the fact that that the information above on your After Visit Summary has been reviewed and is understood.  Full responsibility of the confidentiality of this discharge information lies with you and/or your care-partner.  

## 2011-05-03 ENCOUNTER — Telehealth: Payer: Self-pay | Admitting: *Deleted

## 2011-05-03 NOTE — Telephone Encounter (Signed)
Left message on number given in admitting yest. ewm 

## 2011-05-06 ENCOUNTER — Telehealth: Payer: Self-pay | Admitting: Internal Medicine

## 2011-05-06 MED ORDER — PRAVASTATIN SODIUM 20 MG PO TABS: 20.0000 mg | ORAL_TABLET | Freq: Every day | ORAL | Status: DC

## 2011-05-06 NOTE — Telephone Encounter (Signed)
Rx refill sent to pharmacy. 

## 2011-05-08 ENCOUNTER — Encounter: Payer: Self-pay | Admitting: Internal Medicine

## 2011-05-16 ENCOUNTER — Telehealth: Payer: Self-pay | Admitting: Internal Medicine

## 2011-05-16 DIAGNOSIS — I10 Essential (primary) hypertension: Secondary | ICD-10-CM

## 2011-05-16 MED ORDER — LOSARTAN POTASSIUM 50 MG PO TABS: 50.0000 mg | ORAL_TABLET | Freq: Every day | ORAL | Status: DC

## 2011-05-16 NOTE — Telephone Encounter (Signed)
Rx refill sent to pharmacy. 

## 2011-05-16 NOTE — Telephone Encounter (Signed)
Refill- cozaar 50mg  tab. Take one tablet by mouth every day. Qty 30 last fill 2.15.13

## 2011-06-11 ENCOUNTER — Telehealth: Payer: Self-pay | Admitting: Internal Medicine

## 2011-06-11 MED ORDER — FLUOXETINE HCL 40 MG PO CAPS: 40.0000 mg | ORAL_CAPSULE | Freq: Every day | ORAL | Status: DC

## 2011-06-11 NOTE — Telephone Encounter (Signed)
Rx refill sent to pharmacy. 

## 2011-08-20 ENCOUNTER — Telehealth: Payer: Self-pay | Admitting: Internal Medicine

## 2011-08-20 MED ORDER — HYDROCHLOROTHIAZIDE 25 MG PO TABS: 25.0000 mg | ORAL_TABLET | Freq: Every day | ORAL | Status: DC

## 2011-08-20 NOTE — Telephone Encounter (Signed)
Rx refill sent to pharmacy. 

## 2011-08-20 NOTE — Telephone Encounter (Signed)
Refill- hydrochlorothiazide 25mg  tab. Take one tablet by mouth every day. Qty 30 last fill 5.20.13

## 2011-09-18 ENCOUNTER — Telehealth: Payer: Self-pay | Admitting: Internal Medicine

## 2011-09-18 MED ORDER — RANITIDINE HCL 150 MG PO CAPS: 150.0000 mg | ORAL_CAPSULE | Freq: Two times a day (BID) | ORAL | Status: DC

## 2011-09-18 NOTE — Telephone Encounter (Signed)
Done/SLS 

## 2011-09-18 NOTE — Telephone Encounter (Signed)
Refill-ranitidine 150mg  tab. Take one tablet by mouth every 12 hours as needed. Qty 60 last fill 4.3.13

## 2011-10-16 ENCOUNTER — Ambulatory Visit: Payer: Medicare Other | Admitting: Internal Medicine

## 2011-11-08 ENCOUNTER — Telehealth: Payer: Self-pay | Admitting: Internal Medicine

## 2011-11-08 MED ORDER — PRAVASTATIN SODIUM 20 MG PO TABS: 20.0000 mg | ORAL_TABLET | Freq: Every day | ORAL | Status: DC

## 2011-11-08 NOTE — Telephone Encounter (Signed)
Rx done/SLS 

## 2011-11-08 NOTE — Telephone Encounter (Signed)
Refill-pravastatin 20mg  tab. Take one tablet by mouth every day. Qty 30 last fill 8.10.13

## 2011-11-25 ENCOUNTER — Encounter: Payer: Self-pay | Admitting: Internal Medicine

## 2011-11-25 ENCOUNTER — Telehealth: Payer: Self-pay | Admitting: Internal Medicine

## 2011-11-25 ENCOUNTER — Ambulatory Visit (INDEPENDENT_AMBULATORY_CARE_PROVIDER_SITE_OTHER): Payer: Medicare Other | Admitting: Internal Medicine

## 2011-11-25 VITALS — BP 132/86 | HR 80 | Temp 98.2°F | Resp 16 | Ht 68.0 in | Wt 192.2 lb

## 2011-11-25 DIAGNOSIS — M129 Arthropathy, unspecified: Secondary | ICD-10-CM

## 2011-11-25 DIAGNOSIS — E785 Hyperlipidemia, unspecified: Secondary | ICD-10-CM

## 2011-11-25 DIAGNOSIS — M1990 Unspecified osteoarthritis, unspecified site: Secondary | ICD-10-CM | POA: Insufficient documentation

## 2011-11-25 DIAGNOSIS — Z79899 Other long term (current) drug therapy: Secondary | ICD-10-CM

## 2011-11-25 DIAGNOSIS — I10 Essential (primary) hypertension: Secondary | ICD-10-CM

## 2011-11-25 DIAGNOSIS — Z23 Encounter for immunization: Secondary | ICD-10-CM

## 2011-11-25 HISTORY — DX: Unspecified osteoarthritis, unspecified site: M19.90

## 2011-11-25 LAB — CBC WITH DIFFERENTIAL/PLATELET
HCT: 41.3 % (ref 36.0–46.0)
Hemoglobin: 14.5 g/dL (ref 12.0–15.0)
Lymphocytes Relative: 39 % (ref 12–46)
Lymphs Abs: 1.7 10*3/uL (ref 0.7–4.0)
MCHC: 35.1 g/dL (ref 30.0–36.0)
Monocytes Absolute: 0.5 10*3/uL (ref 0.1–1.0)
Monocytes Relative: 12 % (ref 3–12)
Neutro Abs: 1.8 10*3/uL (ref 1.7–7.7)
WBC: 4.3 10*3/uL (ref 4.0–10.5)

## 2011-11-25 LAB — HEPATIC FUNCTION PANEL
Bilirubin, Direct: 0.1 mg/dL (ref 0.0–0.3)
Indirect Bilirubin: 0.3 mg/dL (ref 0.0–0.9)

## 2011-11-25 LAB — LIPID PANEL
LDL Cholesterol: 99 mg/dL (ref 0–99)
VLDL: 13 mg/dL (ref 0–40)

## 2011-11-25 LAB — BASIC METABOLIC PANEL
BUN: 26 mg/dL — ABNORMAL HIGH (ref 6–23)
CO2: 26 mEq/L (ref 19–32)
Chloride: 104 mEq/L (ref 96–112)
Glucose, Bld: 85 mg/dL (ref 70–99)
Potassium: 4.2 mEq/L (ref 3.5–5.3)

## 2011-11-25 LAB — TSH: TSH: 0.737 u[IU]/mL (ref 0.350–4.500)

## 2011-11-25 MED ORDER — MELOXICAM 7.5 MG PO TABS: 7.5000 mg | ORAL_TABLET | Freq: Every day | ORAL | Status: DC | PRN

## 2011-11-25 NOTE — Assessment & Plan Note (Signed)
Weight loss recommended. Obtain fasting lipid profile and liver function test

## 2011-11-25 NOTE — Assessment & Plan Note (Signed)
Stop Aleve. Attempt Mobic daily when necessary. To be taken with food and no other anti-inflammatories. Followup if no improvement or worsening.

## 2011-11-25 NOTE — Patient Instructions (Signed)
Please schedule fasting labs prior to next visit chem7-v58.69 and lipid/lft-272.4 

## 2011-11-25 NOTE — Assessment & Plan Note (Signed)
Average control. Continue current regimen. Weight loss recommended. Obtain CBC Chem-7 and TSH

## 2011-11-25 NOTE — Progress Notes (Signed)
  Subjective:    Patient ID: Brenda [name], female    DOB: Feb 18, 1946, 65 y.o.   MRN: [id number]  HPI Pt presents to clinic for followup of multiple medical problems. Tolerating statin therapy without myalgias or abnormalities. Notes intermittent arthritis pain of multiple joints without inflammatory changes. Uses Aleve without GI adverse effect. No other active complaints  Past Medical History  Diagnosis Date  . History of chicken pox     childhood age 67  . Depression     counseling  . GERD (gastroesophageal reflux disease)   . Seasonal allergies   . Hypertension   . Hyperlipidemia     2011   Past Surgical History  Procedure Date  . Total abdominal hysterectomy     2002  . Bladder repair     2002    reports that she has quit smoking. She has never used smokeless tobacco. She reports that she does not drink alcohol or use illicit drugs. family history includes Breast cancer (age of onset:40) in her sister; Colon cancer (age of onset:50) in her sister; Heart disease (age of onset:32) in her father; and Hypertension in her father. No Known Allergies    Review of Systems see hpi     Objective:   Physical Exam  Physical Exam  Nursing note and vitals reviewed. Constitutional: Appears well-developed and well-nourished. No distress.  HENT:  Head: Normocephalic and atraumatic.  Right Ear: External ear normal.  Left Ear: External ear normal.  Eyes: Conjunctivae are normal. No scleral icterus.  Neck: Neck supple. Carotid bruit is not present.  Cardiovascular: Normal rate, regular rhythm and normal heart sounds.  Exam reveals no gallop and no friction rub.   No murmur heard. Pulmonary/Chest: Effort normal and breath sounds normal. No respiratory distress. He has no wheezes. no rales.  Lymphadenopathy:    He has no cervical adenopathy.  Neurological:Alert.  Skin: Skin is warm and dry. Not diaphoretic.  Psychiatric: Has a normal mood and affect.        Assessment & Plan:

## 2011-11-29 ENCOUNTER — Telehealth: Payer: Self-pay | Admitting: Internal Medicine

## 2011-11-29 ENCOUNTER — Other Ambulatory Visit: Payer: Self-pay | Admitting: Internal Medicine

## 2011-11-29 MED ORDER — DICLOFENAC SODIUM 1 % TD GEL: 2.0000 g | Freq: Four times a day (QID) | TRANSDERMAL | Status: DC | PRN

## 2011-11-29 NOTE — Telephone Encounter (Signed)
Patient informed, Voltaren Ok/SLS

## 2011-11-29 NOTE — Telephone Encounter (Signed)
Patient states that she is having a side effect to meloxicam. She has been light headed and nauseous. She states that she has vomited once. She would like to know if she should continue taking that medicine.

## 2011-11-29 NOTE — Telephone Encounter (Signed)
Would stop. Make sure took it with food. Can always offer voltaren gel prn -is topical nsaid so should avoid most gi side effects. May be more expensive though

## 2011-12-20 ENCOUNTER — Telehealth: Payer: Self-pay | Admitting: Internal Medicine

## 2011-12-20 DIAGNOSIS — I10 Essential (primary) hypertension: Secondary | ICD-10-CM

## 2011-12-20 MED ORDER — LOSARTAN POTASSIUM 50 MG PO TABS: 50.0000 mg | ORAL_TABLET | Freq: Every day | ORAL | Status: DC

## 2011-12-20 NOTE — Telephone Encounter (Signed)
Losartan refill sent to pharmacy 

## 2012-01-08 ENCOUNTER — Telehealth: Payer: Self-pay | Admitting: Internal Medicine

## 2012-01-08 NOTE — Telephone Encounter (Signed)
Refill- fluoxetine 40mg  cap. Take one capsule by mouth every day. Qty 30 last fill 10.6.13

## 2012-01-09 MED ORDER — FLUOXETINE HCL 40 MG PO CAPS: 40.0000 mg | ORAL_CAPSULE | Freq: Every day | ORAL | Status: DC

## 2012-01-09 NOTE — Telephone Encounter (Signed)
Rx to pharmacy/SLS 

## 2012-01-09 NOTE — Telephone Encounter (Signed)
#  30  rf 6 

## 2012-01-20 ENCOUNTER — Telehealth: Payer: Self-pay | Admitting: *Deleted

## 2012-01-20 ENCOUNTER — Ambulatory Visit (INDEPENDENT_AMBULATORY_CARE_PROVIDER_SITE_OTHER): Payer: Medicare Other | Admitting: Family

## 2012-01-20 ENCOUNTER — Encounter: Payer: Self-pay | Admitting: Family

## 2012-01-20 VITALS — BP 118/82 | HR 78 | Temp 98.2°F | Resp 16 | Wt 191.0 lb

## 2012-01-20 DIAGNOSIS — R109 Unspecified abdominal pain: Secondary | ICD-10-CM

## 2012-01-20 DIAGNOSIS — M5116 Intervertebral disc disorders with radiculopathy, lumbar region: Secondary | ICD-10-CM | POA: Insufficient documentation

## 2012-01-20 DIAGNOSIS — G5601 Carpal tunnel syndrome, right upper limb: Secondary | ICD-10-CM

## 2012-01-20 DIAGNOSIS — M545 Low back pain: Secondary | ICD-10-CM

## 2012-01-20 DIAGNOSIS — G5600 Carpal tunnel syndrome, unspecified upper limb: Secondary | ICD-10-CM

## 2012-01-20 HISTORY — DX: Intervertebral disc disorders with radiculopathy, lumbar region: M51.16

## 2012-01-20 HISTORY — DX: Carpal tunnel syndrome, right upper limb: G56.01

## 2012-01-20 LAB — POCT URINALYSIS DIPSTICK
Leukocytes, UA: NEGATIVE
Nitrite, UA: NEGATIVE
Protein, UA: NEGATIVE
Urobilinogen, UA: 0.2
pH, UA: 6.5

## 2012-01-20 MED ORDER — METHYLPREDNISOLONE (PAK) 4 MG PO TABS: ORAL_TABLET | ORAL | Status: DC

## 2012-01-20 MED ORDER — CYCLOBENZAPRINE HCL 5 MG PO TABS: 5.0000 mg | ORAL_TABLET | Freq: Every evening | ORAL | Status: DC | PRN

## 2012-01-20 NOTE — Assessment & Plan Note (Signed)
Suspect underlying lumbar disc disease which is likely contributing to right toe numbness as well.  Plan medrol dose pak and HS flexeril. If symptoms worsen or if no improvement, plan MRI of the Lspine.

## 2012-01-20 NOTE — Patient Instructions (Addendum)
Please call if symptoms worsen or if no improvement in 1 week.  

## 2012-01-20 NOTE — Progress Notes (Signed)
Subjective:    Patient ID: Brenda [name], female    DOB: 26-May-1945, 66 y.o.   MRN: [id number]  HPI  Left sided low back pain.  Pain started 1 week ago.  Located in the left lower back.  Worse with movement and bending.  Pain radiates into the left buttock. Pain at rest is 8/10.  She has been using aspercream without improvement.  She has also used voltaren gel witout improvement.  She tried aleve, which helped but was instructed not to take. Denies dysuria, frequency or hematuria.  She also reports numbness in the right great toe.    Has hx of CTS with previous steroid injection- has some tingling in the right hand. She is right hand dominant.    Review of Systems See HPI Past Medical History  Diagnosis Date  . History of chicken pox     childhood age 7  . Depression     counseling  . GERD (gastroesophageal reflux disease)   . Seasonal allergies   . Hypertension   . Hyperlipidemia     2011    History   Social History  . Marital Status: Divorced    Spouse Name: N/A    Number of Children: N/A  . Years of Education: N/A   Occupational History  . Not on file.   Social History Main Topics  . Smoking status: Former Games developer  . Smokeless tobacco: Never Used     Comment: Qquit 10 years ago  . Alcohol Use: No  . Drug Use: No  . Sexually Active: Not on file   Other Topics Concern  . Not on file   Social History Narrative  . No narrative on file    Past Surgical History  Procedure Date  . Total abdominal hysterectomy     2002  . Bladder repair     2002    Family History  Problem Relation Age of Onset  . Colon cancer Sister 65  . Breast cancer Sister 46  . Heart disease Father 90  . Hypertension Father     Allergies  Allergen Reactions  . Mobic (Meloxicam) Nausea Only    Current Outpatient Prescriptions on File Prior to Visit  Medication Sig Dispense Refill  . diclofenac sodium (VOLTAREN) 1 % GEL Apply 2 g topically 4 (four) times daily as needed.  100  g  1  . fish oil-omega-3 fatty acids 1000 MG capsule Take 2 g by mouth daily.      Marland Kitchen FLUoxetine (PROZAC) 40 MG capsule Take 1 capsule (40 mg total) by mouth daily.  30 capsule  6  . hydrochlorothiazide (HYDRODIURIL) 25 MG tablet Take 1 tablet (25 mg total) by mouth daily.  30 tablet  6  . losartan (COZAAR) 50 MG tablet Take 1 tablet (50 mg total) by mouth daily.  30 tablet  4  . naproxen sodium (ANAPROX) 220 MG tablet Take 220 mg by mouth 2 (two) times daily with a meal.      . pravastatin (PRAVACHOL) 20 MG tablet Take 1 tablet (20 mg total) by mouth daily.  30 tablet  3  . ranitidine (ZANTAC) 150 MG capsule Take 1 capsule (150 mg total) by mouth 2 (two) times daily.  60 capsule  5    BP 118/82  Pulse 78  Temp 98.2 F (36.8 C) (Oral)  Resp 16  Wt 191 lb (86.637 kg)  SpO2 98%       Objective:   Physical Exam  Constitutional: She is  oriented to person, place, and time. She appears well-developed and well-nourished. No distress.  HENT:  Head: Normocephalic and atraumatic.  Cardiovascular: Normal rate and regular rhythm.   No murmur heard. Pulmonary/Chest: Effort normal and breath sounds normal. No respiratory distress. She has no wheezes. She has no rales. She exhibits no tenderness.  Musculoskeletal: She exhibits no edema.  Neurological: She is alert and oriented to person, place, and time.  Reflex Scores:      Patellar reflexes are 2+ on the right side and 2+ on the left side.      Bilateral LE strength is 5/5 + Phalans right wrist/hand  Skin: Skin is warm and dry.  Psychiatric: She has a normal mood and affect. Her behavior is normal. Judgment and thought content normal.          Assessment & Plan:

## 2012-01-20 NOTE — Assessment & Plan Note (Signed)
Recommended cock up wrist splint. Medrol pak should also help.

## 2012-01-20 NOTE — Telephone Encounter (Signed)
Spoke with pt, she was not evaluated in Urgent Care through the evening and requests appointment today. Reports that pain is still present. Denies n/v, diarrhea or changes in urination. MD schedule full, gave pt appointment with [facility] for 9:45am.  [facility] Triage Call Report Triage Record Num: [id number] Operator: Geanie Berlin Patient Name: Brenda [name] Call Date & Time: 01/19/2012 4:45:52PM Patient Phone: [phone] PCP: Marguarite Arbour Patient Gender: Female PCP Fax : [phone] Patient DOB: [birth year] Practice Name: [facility] [place] [facility] Reason for Call: Caller: [name]/Patient; PCP: Marguarite Arbour (Adults only); CB#: [phone]; Call regarding Left side pain; Constant internal flank pain; rated 5/10. Pain worse with movement. Onset: 01/12/12. Afebrile. Worked Environmental manager at [employer] . Advised to see MD within 24 hours for persistent flank pain not previously evaluated per Flank Pain guideline. PCP has only same day appointments remaining for 01/20/12 that RN cannot use; instructed to call office after 0830 for appointment with PCP. Protocol(s) Used: Flank Pain Recommended Outcome per Protocol: See Provider within 24 hours Reason for Outcome: Persistent flank pain not previously evaluated. Care Advice: Increase intake of fluids. Try to drink 8 oz. (.2 liter) every hour when awake, including unsweetened cranberry juice, unless on restricted fluids for other medical reasons. Take sips of fluid or eat ice chips if nauseated or vomiting. ~ Call provider if you develop frequent urination, inability to urinate, pain or burning with urination, temperature greater than 100.5 F(38.6 C), or urine color is pink, red, smoky, or brownish-green. ~ ~ SYMPTOM / CONDITION MANAGEMENT Analgesic/Antipyretic Advice - Acetaminophen: Consider acetaminophen as directed on label or by pharmacist/provider for pain or fever PRECAUTIONS: - Use if there is no history of liver disease, alcoholism,  or intake of three or more alcohol drinks per day - Only if approved by provider during pregnancy or when breastfeeding - During pregnancy, acetaminophen should not be taken more than 3 consecutive days without telling provider - Do not exceed recommended dose or frequency

## 2012-01-21 LAB — URINALYSIS, ROUTINE W REFLEX MICROSCOPIC
Hgb urine dipstick: NEGATIVE
Ketones, ur: NEGATIVE mg/dL
Leukocytes, UA: NEGATIVE
Nitrite: NEGATIVE
Protein, ur: NEGATIVE mg/dL
Urobilinogen, UA: 0.2 mg/dL (ref 0.0–1.0)

## 2012-02-04 ENCOUNTER — Telehealth: Payer: Self-pay | Admitting: *Deleted

## 2012-02-04 DIAGNOSIS — M545 Low back pain: Secondary | ICD-10-CM

## 2012-02-04 NOTE — Telephone Encounter (Signed)
[name], please advise

## 2012-02-04 NOTE — Telephone Encounter (Signed)
Pt left message stating she completed recent prescriptions for her left side pain and pain continues. Wants to know what else she should do?  Please advise.

## 2012-02-04 NOTE — Telephone Encounter (Signed)
[name] saw her and recommended considering back mri is sx's persisted

## 2012-02-05 NOTE — Telephone Encounter (Signed)
Mri order has been placed

## 2012-02-05 NOTE — Telephone Encounter (Signed)
Notified pt and she voices understanding. 

## 2012-02-08 ENCOUNTER — Ambulatory Visit (HOSPITAL_BASED_OUTPATIENT_CLINIC_OR_DEPARTMENT_OTHER)
Admission: RE | Admit: 2012-02-08 | Discharge: 2012-02-08 | Disposition: A | Discharge status: Home / Self Care | Payer: Medicare Other | Source: Ambulatory Visit | Attending: Family | Admitting: Family

## 2012-02-08 DIAGNOSIS — IMO0002 Reserved for concepts with insufficient information to code with codable children: Secondary | ICD-10-CM | POA: Insufficient documentation

## 2012-02-08 DIAGNOSIS — M79605 Pain in left leg: Secondary | ICD-10-CM

## 2012-02-08 DIAGNOSIS — M5126 Other intervertebral disc displacement, lumbar region: Secondary | ICD-10-CM | POA: Insufficient documentation

## 2012-02-08 DIAGNOSIS — M545 Low back pain, unspecified: Secondary | ICD-10-CM | POA: Insufficient documentation

## 2012-02-10 ENCOUNTER — Telehealth: Payer: Self-pay | Admitting: Family

## 2012-02-10 ENCOUNTER — Encounter: Payer: Self-pay | Admitting: Family

## 2012-02-10 DIAGNOSIS — M549 Dorsalgia, unspecified: Secondary | ICD-10-CM

## 2012-02-10 NOTE — Telephone Encounter (Signed)
Pls call pt and let her know that I have reviewed her MRI results and it shows degenerative discs/bulging disc in lumbar spine.  One of the nerves looks like it may be irritated from the bulging disc which may explain her pain.  I would like to refer her to neurosurgery for further evaluation. (pended below)

## 2012-02-10 NOTE — Telephone Encounter (Signed)
Notified pt and she voices understanding. She will proceed with referral.

## 2012-03-06 ENCOUNTER — Telehealth: Payer: Self-pay | Admitting: Internal Medicine

## 2012-03-06 MED ORDER — PRAVASTATIN SODIUM 20 MG PO TABS: 20.0000 mg | ORAL_TABLET | Freq: Every day | ORAL | Status: DC

## 2012-03-06 NOTE — Telephone Encounter (Signed)
Rx to pharmacy/SLS 

## 2012-03-06 NOTE — Telephone Encounter (Signed)
Refill- pravastatin 20mg  tab. Take one tablet by mouth every day. Qty 30 last fill 12.4.13

## 2012-03-20 ENCOUNTER — Telehealth: Payer: Self-pay | Admitting: Internal Medicine

## 2012-03-20 NOTE — Telephone Encounter (Signed)
Refill- hydrochlorothiazide 25mg  tab. Take one tablet by mouth every day. Qty 30 last fill 12.13.13

## 2012-03-20 NOTE — Telephone Encounter (Signed)
Rx to pharmacy/SLS 

## 2012-04-01 ENCOUNTER — Telehealth: Payer: Self-pay | Admitting: Internal Medicine

## 2012-04-01 MED ORDER — HYDROCHLOROTHIAZIDE 25 MG PO TABS: 25.0000 mg | ORAL_TABLET | Freq: Every day | ORAL | Status: DC

## 2012-04-01 NOTE — Telephone Encounter (Signed)
Hydrochlorothiazide 25mg  qty 30 take 1 per day.  She is out.

## 2012-04-13 ENCOUNTER — Telehealth: Payer: Self-pay

## 2012-04-13 DIAGNOSIS — R10A0 Flank pain, unspecified side: Secondary | ICD-10-CM

## 2012-04-13 DIAGNOSIS — R109 Unspecified abdominal pain: Secondary | ICD-10-CM

## 2012-04-13 DIAGNOSIS — R7989 Other specified abnormal findings of blood chemistry: Secondary | ICD-10-CM

## 2012-04-13 NOTE — Telephone Encounter (Signed)
All I can fine is elevated creatinine, recommend we repeat a renal and flank pain, run a UA for flank pain

## 2012-04-13 NOTE — Telephone Encounter (Signed)
Patient came in today for labs, I'm not sure what needs to be ordered?

## 2012-04-14 LAB — RENAL FUNCTION PANEL
Albumin: 4.4 g/dL (ref 3.5–5.2)
BUN: 22 mg/dL (ref 6–23)
Calcium: 9.5 mg/dL (ref 8.4–10.5)
Chloride: 106 mEq/L (ref 96–112)
Creat: 0.67 mg/dL (ref 0.50–1.10)
Phosphorus: 3.2 mg/dL (ref 2.3–4.6)

## 2012-04-15 LAB — URINE CULTURE: Colony Count: NO GROWTH

## 2012-04-20 ENCOUNTER — Encounter: Payer: Self-pay | Admitting: Family Medicine

## 2012-04-20 ENCOUNTER — Ambulatory Visit (INDEPENDENT_AMBULATORY_CARE_PROVIDER_SITE_OTHER): Payer: Medicare Other | Admitting: Family Medicine

## 2012-04-20 VITALS — BP 102/70 | HR 97 | Temp 97.4°F | Ht 68.0 in | Wt 196.1 lb

## 2012-04-20 DIAGNOSIS — M5126 Other intervertebral disc displacement, lumbar region: Secondary | ICD-10-CM

## 2012-04-20 DIAGNOSIS — I10 Essential (primary) hypertension: Secondary | ICD-10-CM

## 2012-04-20 DIAGNOSIS — E785 Hyperlipidemia, unspecified: Secondary | ICD-10-CM

## 2012-04-20 DIAGNOSIS — R5383 Other fatigue: Secondary | ICD-10-CM

## 2012-04-20 DIAGNOSIS — M5116 Intervertebral disc disorders with radiculopathy, lumbar region: Secondary | ICD-10-CM

## 2012-04-20 DIAGNOSIS — R5381 Other malaise: Secondary | ICD-10-CM

## 2012-04-20 DIAGNOSIS — R635 Abnormal weight gain: Secondary | ICD-10-CM

## 2012-04-20 NOTE — Patient Instructions (Addendum)
Moist heat and gentle stretching Digestive Advantage probiotic by Schiff  Sacroiliac Joint Dysfunction The sacroiliac joint connects the lower part of the spine (the sacrum) with the bones of the pelvis. CAUSES  Sometimes, there is no obvious reason for sacroiliac joint dysfunction. Other times, it may occur   During pregnancy.  After injury, such as:  Car accidents.  Sport-related injuries.  Work-related injuries.  Due to one leg being shorter than the other.  Due to other conditions that affect the joints, such as:  Rheumatoid arthritis.  Gout.  Psoriasis.  Joint infection (septic arthritis). SYMPTOMS  Symptoms may include:  Pain in the:  Lower back.  Buttocks.  Groin.  Thighs and legs.  Difficult sitting, standing, walking, lying, bending or lifting. DIAGNOSIS  A number of tests may be used to help diagnose the cause of sacroiliac joint dysfunction, including:  Imaging tests to look for other causes of pain, including:  MRI.  CT scan.  Bone scan.  Diagnostic injection: During a special x-ray (called fluoroscopy), a needle is put into the sacroiliac joint. A numbing medicine is injected into the joint. If the pain is improved or stopped, the diagnosis of sacroiliac joint dysfunction is more likely. TREATMENT  There are a number of types of treatment used for sacroiliac joint dysfunction, including:  Only take over-the-counter or prescription medicines for pain, discomfort, or fever as directed by your caregiver.  Medications to relax muscles.  Rest. Decreasing activity can help cut down on painful muscle spasms and allow the back to heal.  Application of heat or ice to the lower back may improve muscle spasms and soothe pain.  Brace. A special back brace, called a sacroiliac belt, can help support the joint while your back is healing.  Physical therapy can help teach comfortable positions and exercises to strengthen muscles that support the  sacroiliac joint.  Cortisone injections. Injections of steroid medicine into the joint can help decrease swelling and improve pain.  Hyaluronic acid injections. This chemical improves lubrication within the sacroiliac joint, thereby decreasing pain.  Radiofrequency ablation. A special needle is placed into the joint, where it burns away nerves that are carrying pain messages from the joint.  Surgery. Because pain occurs during movement of the joint, screws and plates may be installed in order to limit or prevent joint motion. HOME CARE INSTRUCTIONS   Take all medications exactly as directed.  Follow instructions regarding both rest and physical activity, to avoid worsening the pain.  Do physical therapy exercises exactly as prescribed. SEEK IMMEDIATE MEDICAL CARE IF:  You experience increasingly severe pain.  You develop new symptoms, such as numbness or tingling in your legs or feet.  You lose bladder or bowel control. Document Released: 05/17/2008 Document Revised: 05/13/2011 Document Reviewed: 05/17/2008 [facility] Patient Information 2013 [place], [place].

## 2012-04-26 ENCOUNTER — Encounter: Payer: Self-pay | Admitting: Family Medicine

## 2012-04-26 DIAGNOSIS — E785 Hyperlipidemia, unspecified: Secondary | ICD-10-CM

## 2012-04-26 HISTORY — DX: Hyperlipidemia, unspecified: E78.5

## 2012-04-26 NOTE — Assessment & Plan Note (Signed)
Tolerating Pravastatin, avoid trans fats 

## 2012-04-26 NOTE — Assessment & Plan Note (Signed)
Well controlled 

## 2012-04-26 NOTE — Assessment & Plan Note (Signed)
Encouraged Aleve and Aspercreme, add krill oil and stay active

## 2012-04-26 NOTE — Progress Notes (Signed)
Patient ID: Brenda [name], female   DOB: October 06, 1945, 67 y.o.   MRN: [id number] Brenda [name] [id number] [birth year] 04/26/2012      Progress Note-Follow Up  Subjective  Chief Complaint  Chief Complaint  Patient presents with  . Follow-up    5 month    HPI  Is a 67 year old female who is in today for followup. Her biggest complaint is intermittent low back pain with left hip posterior pain. No symptoms down the leg. No incontinence. No falls or recent trauma. Otherwise no GI or GU complaints. No chest pain, palpitations, shortness  Past Medical History  Diagnosis Date  . History of chicken pox     childhood age 58  . Depression     counseling  . GERD (gastroesophageal reflux disease)   . Seasonal allergies   . Hypertension   . Hyperlipidemia     2011  . Other and unspecified hyperlipidemia 04/26/2012    Past Surgical History  Procedure Laterality Date  . Total abdominal hysterectomy      2002  . Bladder repair      2002    Family History  Problem Relation Age of Onset  . Colon cancer Sister 30  . Breast cancer Sister 67  . Heart disease Father 59  . Hypertension Father     History   Social History  . Marital Status: Divorced    Spouse Name: N/A    Number of Children: N/A  . Years of Education: N/A   Occupational History  . Not on file.   Social History Main Topics  . 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 Smokeless tobacco: Never Used     Comment: Qquit 10 years ago  . Alcohol Use: No  . Drug Use: No  . Sexually Active: Not on file   Other Topics Concern  . Not on file   Social History Narrative  . No narrative on file    Current Outpatient Prescriptions on File Prior to Visit  Medication Sig Dispense Refill  . cyclobenzaprine (FLEXERIL) 5 MG tablet Take 1 tablet (5 mg total) by mouth at bedtime as needed for muscle spasms.  30 tablet  1  . fish oil-omega-3 fatty acids 1000 MG capsule Take 2 g by mouth daily.      Marland Kitchen FLUoxetine (PROZAC) 40 MG capsule  Take 1 capsule (40 mg total) by mouth daily.  30 capsule  6  . hydrochlorothiazide (HYDRODIURIL) 25 MG tablet Take 1 tablet (25 mg total) by mouth daily.  30 tablet  2  . losartan (COZAAR) 50 MG tablet Take 1 tablet (50 mg total) by mouth daily.  30 tablet  4  . naproxen sodium (ANAPROX) 220 MG tablet Take 220 mg by mouth 2 (two) times daily with a meal.      . pravastatin (PRAVACHOL) 20 MG tablet Take 1 tablet (20 mg total) by mouth daily.  30 tablet  3  . ranitidine (ZANTAC) 150 MG capsule Take 1 capsule (150 mg total) by mouth 2 (two) times daily.  60 capsule  5   No current facility-administered medications on file prior to visit.    Allergies  Allergen Reactions  . Mobic (Meloxicam) Nausea Only    Review of Systems  Review of Systems  Constitutional: Negative for fever and malaise/fatigue.  HENT: Negative for congestion.   Eyes: Negative for pain and discharge.  Respiratory: Negative for shortness of breath.   Cardiovascular: Negative for chest pain, palpitations and leg swelling.  Gastrointestinal:  Negative for nausea, abdominal pain and diarrhea.  Genitourinary: Negative for dysuria.  Musculoskeletal: Positive for back pain. Negative for falls.  Skin: Negative for rash.  Neurological: Negative for loss of consciousness and headaches.  Endo/Heme/Allergies: Negative for polydipsia.  Psychiatric/Behavioral: Negative for depression and suicidal ideas. The patient is not nervous/anxious and does not have insomnia.   Physical Exam  Constitutional: She is oriented to person, place, and time and well-developed, well-nourished, and in no distress. No distress.  HENT:  Head: Normocephalic and atraumatic.  Eyes: Conjunctivae are normal.  Neck: Neck supple. No thyromegaly present.  Cardiovascular: Normal rate, regular rhythm and normal heart sounds.   No murmur heard. Pulmonary/Chest: Effort normal and breath sounds normal. She has no wheezes.  Abdominal: She exhibits no distension  and no mass.  Musculoskeletal: She exhibits no edema.  Lymphadenopathy:    She has no cervical adenopathy.  Neurological: She is alert and oriented to person, place, and time.  Skin: Skin is warm and dry. No rash noted. She is not diaphoretic.  Psychiatric: Memory, affect and judgment normal.    Objective  BP 102/70  Pulse 97  Temp(Src) 97.4 F (36.3 C) (Oral)  Ht 5\' 8"  (1.727 m)  Wt 196 lb 1.3 oz (88.941 kg)  BMI 29.82 kg/m2  SpO2 95%  Physical Exam    Lab Results  Component Value Date   TSH 0.803 04/20/2012   Lab Results  Component Value Date   WBC 4.3 11/25/2011   HGB 14.5 11/25/2011   HCT 41.3 11/25/2011   MCV 86.6 11/25/2011   PLT 328 11/25/2011   Lab Results  Component Value Date   CREATININE 0.67 04/13/2012   BUN 22 04/13/2012   NA 139 04/13/2012   K 4.6 04/13/2012   CL 106 04/13/2012   CO2 26 04/13/2012   Lab Results  Component Value Date   ALT 12 11/25/2011   AST 15 11/25/2011   ALKPHOS 69 11/25/2011   BILITOT 0.4 11/25/2011   Lab Results  Component Value Date   CHOL 175 11/25/2011   Lab Results  Component Value Date   HDL 63 11/25/2011   Lab Results  Component Value Date   LDLCALC 99 11/25/2011   Lab Results  Component Value Date   TRIG 65 11/25/2011   Lab Results  Component Value Date   CHOLHDL 2.8 11/25/2011     Assessment & Plan  Lumbar disc disease with radiculopathy Encouraged Aleve and Aspercreme, add krill oil and stay active  Hypertension Well controlled  Other and unspecified hyperlipidemia Tolerating Pravastatin, avoid trans fats.

## 2012-05-25 ENCOUNTER — Telehealth: Payer: Self-pay | Admitting: Internal Medicine

## 2012-05-25 DIAGNOSIS — I10 Essential (primary) hypertension: Secondary | ICD-10-CM

## 2012-05-25 MED ORDER — LOSARTAN POTASSIUM 50 MG PO TABS: 50.0000 mg | ORAL_TABLET | Freq: Every day | ORAL | Status: DC

## 2012-05-25 NOTE — Telephone Encounter (Signed)
losartin 50 mg tab qty 30 take 1 tablet by mouth every day last fill 04-17-2012

## 2012-06-22 ENCOUNTER — Encounter: Payer: Self-pay | Admitting: Family Medicine

## 2012-06-22 ENCOUNTER — Ambulatory Visit (INDEPENDENT_AMBULATORY_CARE_PROVIDER_SITE_OTHER): Payer: Medicare Other | Admitting: Family Medicine

## 2012-06-22 VITALS — BP 128/82 | HR 94 | Temp 98.0°F | Ht 68.0 in | Wt 195.0 lb

## 2012-06-22 DIAGNOSIS — Z1211 Encounter for screening for malignant neoplasm of colon: Secondary | ICD-10-CM

## 2012-06-22 DIAGNOSIS — I10 Essential (primary) hypertension: Secondary | ICD-10-CM

## 2012-06-22 DIAGNOSIS — R635 Abnormal weight gain: Secondary | ICD-10-CM

## 2012-06-22 DIAGNOSIS — E785 Hyperlipidemia, unspecified: Secondary | ICD-10-CM

## 2012-06-22 DIAGNOSIS — M5116 Intervertebral disc disorders with radiculopathy, lumbar region: Secondary | ICD-10-CM

## 2012-06-22 DIAGNOSIS — M5126 Other intervertebral disc displacement, lumbar region: Secondary | ICD-10-CM

## 2012-06-22 DIAGNOSIS — G5600 Carpal tunnel syndrome, unspecified upper limb: Secondary | ICD-10-CM

## 2012-06-22 DIAGNOSIS — F411 Generalized anxiety disorder: Secondary | ICD-10-CM

## 2012-06-22 DIAGNOSIS — G5601 Carpal tunnel syndrome, right upper limb: Secondary | ICD-10-CM

## 2012-06-22 DIAGNOSIS — F419 Anxiety disorder, unspecified: Secondary | ICD-10-CM

## 2012-06-22 MED ORDER — ESCITALOPRAM OXALATE 20 MG PO TABS: 20.0000 mg | ORAL_TABLET | Freq: Every day | ORAL | Status: DC

## 2012-06-22 NOTE — Patient Instructions (Signed)

## 2012-06-22 NOTE — Assessment & Plan Note (Signed)
Avoid trans fats and increase exercise 

## 2012-06-22 NOTE — Progress Notes (Signed)
Patient ID: Brenda [name], female   DOB: April 18, 1945, 67 y.o.   MRN: [id number] Brenda [name] [id number] July 01, 1945 06/22/2012      Progress Note-Follow Up  Subjective  Chief Complaint  Chief Complaint  Patient presents with  . Follow-up    2 month    HPI  Patient is a 67 year old American female who is in today in followup. Overall she's doing somewhat better. She notes digestive advantage and dietary changes that helped her bowels. Bowels are moving comfortably without me diarrhea, bloody or tarry stool. Her low back pain is persistent but she is following with orthopedics and is scheduled to have her first steroid injection this afternoon. Is hoping that with some improvement she will to walk somewhat more. She continues to show with high-level anxiety and irritability and is willing to consider switching medications at this time. No headaches or chills.. No palpitations shortness or breath GI or GU complaints noted today.  Past Medical History  Diagnosis Date  . History of chicken pox     childhood age 19  . Depression     counseling  . GERD (gastroesophageal reflux disease)   . Seasonal allergies   . Hypertension   . Hyperlipidemia     2011  . Other and unspecified hyperlipidemia 04/26/2012    Past Surgical History  Procedure Laterality Date  . Total abdominal hysterectomy      2002  . Bladder repair      2002    Family History  Problem Relation Age of Onset  . Colon cancer Sister 76  . Breast cancer Sister 77  . Heart disease Father 76  . Hypertension Father     History   Social History  . Marital Status: Divorced    Spouse Name: N/A    Number of Children: N/A  . Years of Education: N/A   Occupational History  . Not on file.   Social History Main Topics  . Smoking status: Former Games developer  . Smokeless tobacco: Never Used     Comment: Qquit 10 years ago  . Alcohol Use: No  . Drug Use: No  . Sexually Active: Not on file   Other Topics Concern  . Not on  file   Social History Narrative  . No narrative on file    Current Outpatient Prescriptions on File Prior to Visit  Medication Sig Dispense Refill  . cyclobenzaprine (FLEXERIL) 5 MG tablet Take 1 tablet (5 mg total) by mouth at bedtime as needed for muscle spasms.  30 tablet  1  . fish oil-omega-3 fatty acids 1000 MG capsule Take 2 g by mouth daily.      . hydrochlorothiazide (HYDRODIURIL) 25 MG tablet Take 1 tablet (25 mg total) by mouth daily.  30 tablet  2  . losartan (COZAAR) 50 MG tablet Take 1 tablet (50 mg total) by mouth daily.  30 tablet  4  . naproxen sodium (ANAPROX) 220 MG tablet Take 220 mg by mouth 2 (two) times daily with a meal.      . pravastatin (PRAVACHOL) 20 MG tablet Take 1 tablet (20 mg total) by mouth daily.  30 tablet  3  . ranitidine (ZANTAC) 150 MG capsule Take 1 capsule (150 mg total) by mouth 2 (two) times daily.  60 capsule  5   No current facility-administered medications on file prior to visit.    Allergies  Allergen Reactions  . Mobic (Meloxicam) Nausea Only    Review of Systems  Review of  Systems  Constitutional: Positive for malaise/fatigue. Negative for fever.  HENT: Negative for congestion.   Eyes: Negative for discharge.  Respiratory: Negative for shortness of breath.   Cardiovascular: Negative for chest pain, palpitations and leg swelling.  Gastrointestinal: Negative for nausea, abdominal pain and diarrhea.  Genitourinary: Negative for dysuria.  Musculoskeletal: Positive for back pain. Negative for falls.  Skin: Negative for rash.  Neurological: Negative for loss of consciousness and headaches.  Endo/Heme/Allergies: Negative for polydipsia.  Psychiatric/Behavioral: Negative for depression and suicidal ideas. The patient is not nervous/anxious and does not have insomnia.     Objective  BP 128/82  Pulse 94  Temp(Src) 98 F (36.7 C) (Oral)  Ht 5\' 8"  (1.727 m)  Wt 195 lb (88.451 kg)  BMI 29.66 kg/m2  SpO2 92%  Physical  Exam  Physical Exam  Constitutional: She is oriented to person, place, and time and well-developed, well-nourished, and in no distress. No distress.  HENT:  Head: Normocephalic and atraumatic.  Eyes: Conjunctivae are normal.  Neck: Neck supple. No thyromegaly present.  Cardiovascular: Normal rate, regular rhythm and normal heart sounds.   No murmur heard. Pulmonary/Chest: Effort normal and breath sounds normal. She has no wheezes.  Abdominal: She exhibits no distension and no mass.  Musculoskeletal: She exhibits no edema.  Lymphadenopathy:    She has no cervical adenopathy.  Neurological: She is alert and oriented to person, place, and time.  Skin: Skin is warm and dry. No rash noted. She is not diaphoretic.  Psychiatric: Memory, affect and judgment normal.    Lab Results  Component Value Date   TSH 0.803 04/20/2012   Lab Results  Component Value Date   WBC 4.3 11/25/2011   HGB 14.5 11/25/2011   HCT 41.3 11/25/2011   MCV 86.6 11/25/2011   PLT 328 11/25/2011   Lab Results  Component Value Date   CREATININE 0.67 04/13/2012   BUN 22 04/13/2012   NA 139 04/13/2012   K 4.6 04/13/2012   CL 106 04/13/2012   CO2 26 04/13/2012   Lab Results  Component Value Date   ALT 12 11/25/2011   AST 15 11/25/2011   ALKPHOS 69 11/25/2011   BILITOT 0.4 11/25/2011   Lab Results  Component Value Date   CHOL 175 11/25/2011   Lab Results  Component Value Date   HDL 63 11/25/2011   Lab Results  Component Value Date   LDLCALC 99 11/25/2011   Lab Results  Component Value Date   TRIG 65 11/25/2011   Lab Results  Component Value Date   CHOLHDL 2.8 11/25/2011     Assessment & Plan  Colon cancer screening Was struggling with some mild GI upset and constipation at times, started a probiotic and upped the fluids she drinks and is much better  Weight gain Encouraged DASH diet and increased exercise  Hypertension Well controlled no change to meds today  Lumbar disc disease with  radiculopathy Is going for her first epidural injection in her low back today with ortho, continue NSAIDs and fatty acid supplements  Anxiety Will try switching to Lexapro from Prozac, has taken for quite some time and is interested in a change.  Other and unspecified hyperlipidemia Avoid trans fats and increase exercise.  Carpal tunnel syndrome on right C/o paresthesias in right hand especially after sleeping on arm, encouraged moist heat and gentle stretch of neck each eve, consider Thoracic outlet Syndrome and report worsening symptoms

## 2012-06-22 NOTE — Assessment & Plan Note (Signed)
Is going for her first epidural injection in her low back today with ortho, continue NSAIDs and fatty acid supplements

## 2012-06-22 NOTE — Assessment & Plan Note (Signed)
Well controlled no change to meds today 

## 2012-06-22 NOTE — Assessment & Plan Note (Signed)
Will try switching to Lexapro from Prozac, has taken for quite some time and is interested in a change.

## 2012-06-22 NOTE — Assessment & Plan Note (Signed)
C/o paresthesias in right hand especially after sleeping on arm, encouraged moist heat and gentle stretch of neck each eve, consider Thoracic outlet Syndrome and report worsening symptoms

## 2012-06-22 NOTE — Assessment & Plan Note (Signed)
Was struggling with some mild GI upset and constipation at times, started a probiotic and upped the fluids she drinks and is much better

## 2012-06-22 NOTE — Assessment & Plan Note (Signed)
Encouraged DASH diet and increased exercise 

## 2012-06-29 ENCOUNTER — Other Ambulatory Visit: Payer: Self-pay | Admitting: Family Medicine

## 2012-07-22 ENCOUNTER — Other Ambulatory Visit: Payer: Self-pay | Admitting: Family Medicine

## 2012-07-24 ENCOUNTER — Telehealth: Payer: Self-pay | Admitting: Internal Medicine

## 2012-07-24 MED ORDER — PRAVASTATIN SODIUM 20 MG PO TABS: 20.0000 mg | ORAL_TABLET | Freq: Every day | ORAL | Status: DC

## 2012-07-24 NOTE — Telephone Encounter (Signed)
Patient Information:  Caller Name: Bijal  Phone: [phone]  Patient: Delana, Manganello  Gender: Female  DOB: 1945/04/02  Age: 67 Years  PCP: Sandford Craze (Adults only)  Office Follow Up:  Does the office need to follow up with this patient?: Yes  Instructions For The Office: Refill on RX (pravastatin) and instructions for back pain relief.  Thank you.  RN Note:  Appt declined. Instructed caller to f/u w/Provider who performed the procedure for furhter after instructions.  Caller states she tried 1 wk ago and hasn't heard back.  Offered Tylenol caller states it makes her nauseated.  Symptoms  Reason For Call & Symptoms: Pt received an epidural on 07/08/2012 ans was told not to take  Aleve after the procedure.  Caller has back stiffness and wants to know what she can take for discomfort. . Back pain located  in  the left upper back 7/10.  Pt feels nauseated.  No SOB or chest pain. Caller requesting a refill for her her Pravastatin.  [facility] via EMR verified.  Reviewed Health History In EMR: Yes  Reviewed Medications In EMR: Yes  Reviewed Allergies In EMR: Yes  Reviewed Surgeries / Procedures: Yes  Date of Onset of Symptoms: 07/17/2012  Treatments Tried: Pool exercises Pain relieving ointment  Treatments Tried Worked: No  Guideline(s) Used:  Back Pain  Disposition Per Guideline:   See Within 2 Weeks in Office  Reason For Disposition Reached:   Back pain is a chronic symptom (recurrent or ongoing AND lasting > 4 weeks)  Advice Given:  Reassurance:  Twisting or heavy lifting can cause back pain.  With treatment, the pain most often goes away in 1-2 weeks.  Cold or Heat:  Cold Pack: For pain or swelling, use a cold pack or ice wrapped in a wet cloth. Put it on the sore area for 20 minutes. Repeat 4 times on the first day, then as needed.  Heat Pack: If pain lasts over 2 days, apply heat to the sore area. Use a heat pack, heating pad, or warm wet washcloth. Do this for 10  minutes, then as needed. For widespread stiffness, take a hot bath or hot shower instead. Move the sore area under the warm water.  Call Back If:  You become worse.  Patient Refused Recommendation:  Patient Requests Prescription  Requesting advice

## 2012-07-24 NOTE — Telephone Encounter (Signed)
Rx sent in to pharmacy. Please advise on patient's back pain?

## 2012-08-24 ENCOUNTER — Ambulatory Visit (INDEPENDENT_AMBULATORY_CARE_PROVIDER_SITE_OTHER): Payer: Medicare Other | Admitting: Family Medicine

## 2012-08-24 ENCOUNTER — Telehealth: Payer: Self-pay | Admitting: Internal Medicine

## 2012-08-24 ENCOUNTER — Other Ambulatory Visit: Payer: Self-pay | Admitting: Family Medicine

## 2012-08-24 ENCOUNTER — Encounter: Payer: Self-pay | Admitting: Family Medicine

## 2012-08-24 VITALS — BP 108/80 | HR 80 | Temp 98.7°F | Ht 68.0 in | Wt 197.0 lb

## 2012-08-24 DIAGNOSIS — N393 Stress incontinence (female) (male): Secondary | ICD-10-CM | POA: Insufficient documentation

## 2012-08-24 DIAGNOSIS — F411 Generalized anxiety disorder: Secondary | ICD-10-CM

## 2012-08-24 DIAGNOSIS — M5116 Intervertebral disc disorders with radiculopathy, lumbar region: Secondary | ICD-10-CM

## 2012-08-24 DIAGNOSIS — R32 Unspecified urinary incontinence: Secondary | ICD-10-CM

## 2012-08-24 DIAGNOSIS — E079 Disorder of thyroid, unspecified: Secondary | ICD-10-CM

## 2012-08-24 DIAGNOSIS — I10 Essential (primary) hypertension: Secondary | ICD-10-CM

## 2012-08-24 DIAGNOSIS — R7309 Other abnormal glucose: Secondary | ICD-10-CM

## 2012-08-24 DIAGNOSIS — Z8619 Personal history of other infectious and parasitic diseases: Secondary | ICD-10-CM | POA: Insufficient documentation

## 2012-08-24 DIAGNOSIS — M5126 Other intervertebral disc displacement, lumbar region: Secondary | ICD-10-CM

## 2012-08-24 DIAGNOSIS — F419 Anxiety disorder, unspecified: Secondary | ICD-10-CM

## 2012-08-24 DIAGNOSIS — E785 Hyperlipidemia, unspecified: Secondary | ICD-10-CM

## 2012-08-24 DIAGNOSIS — K219 Gastro-esophageal reflux disease without esophagitis: Secondary | ICD-10-CM

## 2012-08-24 HISTORY — DX: Unspecified urinary incontinence: R32

## 2012-08-24 HISTORY — DX: Personal history of other infectious and parasitic diseases: Z86.19

## 2012-08-24 HISTORY — DX: Stress incontinence (female) (male): N39.3

## 2012-08-24 HISTORY — DX: Gastro-esophageal reflux disease without esophagitis: K21.9

## 2012-08-24 LAB — RENAL FUNCTION PANEL
BUN: 18 mg/dL (ref 6–23)
Calcium: 9.7 mg/dL (ref 8.4–10.5)
Chloride: 102 mEq/L (ref 96–112)
Phosphorus: 3.3 mg/dL (ref 2.3–4.6)
Potassium: 5 mEq/L (ref 3.5–5.3)
Sodium: 140 mEq/L (ref 135–145)

## 2012-08-24 LAB — CBC
HCT: 42.4 % (ref 36.0–46.0)
Hemoglobin: 14.8 g/dL (ref 12.0–15.0)
MCHC: 34.9 g/dL (ref 30.0–36.0)
WBC: 6.1 10*3/uL (ref 4.0–10.5)

## 2012-08-24 LAB — HEPATIC FUNCTION PANEL
ALT: 11 U/L (ref 0–35)
Albumin: 3.9 g/dL (ref 3.5–5.2)
Total Protein: 6.9 g/dL (ref 6.0–8.3)

## 2012-08-24 LAB — LIPID PANEL
Cholesterol: 202 mg/dL — ABNORMAL HIGH (ref 0–200)
Triglycerides: 148 mg/dL (ref <150)

## 2012-08-24 MED ORDER — ESCITALOPRAM OXALATE 20 MG PO TABS: 20.0000 mg | ORAL_TABLET | Freq: Every day | ORAL | Status: DC

## 2012-08-24 MED ORDER — HYDROCHLOROTHIAZIDE 25 MG PO TABS: 25.0000 mg | ORAL_TABLET | Freq: Every day | ORAL | Status: DC

## 2012-08-24 NOTE — Progress Notes (Signed)
  Subjective:    Patient ID: Brenda [name], female    DOB: [birth year], 67 y.o.   MRN: [id number]  HPI Patient is a 67 year old American female who is in today in followup. Very happy with overall mood on Lexapro. She notes decreased anxiety. Probiotic is helping with digestion, no diarrhea, dyspepsia, or nausea. Low back pain is improved with second steroid injection. No headaches, chills, shortness of breath, palpitations, GI, or GU symptoms noted today. Complains of urinary frequency and urgency for last month. Also notes increased perspiration.     Review of Systems  Constitutional: Positive for diaphoresis. Negative for chills and fatigue.       Objective:   Physical Exam        Assessment & Plan:

## 2012-08-24 NOTE — Assessment & Plan Note (Signed)
Well controlled, no changes 

## 2012-08-24 NOTE — Assessment & Plan Note (Signed)
Good result from switch from Prozac to Lexapro. Continue same

## 2012-08-24 NOTE — Assessment & Plan Note (Signed)
Good response to probiotics, may use Zantac prn

## 2012-08-24 NOTE — Assessment & Plan Note (Signed)
Check a UA and culture, recommend kegel exercise

## 2012-08-24 NOTE — Patient Instructions (Addendum)
  Vinegar as needed Avoid partially hydrogenated oils/trans fats    Urinary Tract Infection Urinary tract infections (UTIs) can develop anywhere along your urinary tract. Your urinary tract is your body's drainage system for removing wastes and extra water. Your urinary tract includes two kidneys, two ureters, a bladder, and a urethra. Your kidneys are a pair of bean-shaped organs. Each kidney is about the size of your fist. They are located below your ribs, one on each side of your spine. CAUSES Infections are caused by microbes, which are microscopic organisms, including fungi, viruses, and bacteria. These organisms are so small that they can only be seen through a microscope. Bacteria are the microbes that most commonly cause UTIs. SYMPTOMS  Symptoms of UTIs may vary by age and gender of the patient and by the location of the infection. Symptoms in young women typically include a frequent and intense urge to urinate and a painful, burning feeling in the bladder or urethra during urination. Older women and men are more likely to be tired, shaky, and weak and have muscle aches and abdominal pain. A fever may mean the infection is in your kidneys. Other symptoms of a kidney infection include pain in your back or sides below the ribs, nausea, and vomiting. DIAGNOSIS To diagnose a UTI, your caregiver will ask you about your symptoms. Your caregiver also will ask to provide a urine sample. The urine sample will be tested for bacteria and white blood cells. White blood cells are made by your body to help fight infection. TREATMENT  Typically, UTIs can be treated with medication. Because most UTIs are caused by a bacterial infection, they usually can be treated with the use of antibiotics. The choice of antibiotic and length of treatment depend on your symptoms and the type of bacteria causing your infection. HOME CARE INSTRUCTIONS  If you were prescribed antibiotics, take them exactly as your caregiver  instructs you. Finish the medication even if you feel better after you have only taken some of the medication.  Drink enough water and fluids to keep your urine clear or pale yellow.  Avoid caffeine, tea, and carbonated beverages. They tend to irritate your bladder.  Empty your bladder often. Avoid holding urine for long periods of time.  Empty your bladder before and after sexual intercourse.  After a bowel movement, women should cleanse from front to back. Use each tissue only once. SEEK MEDICAL CARE IF:   You have back pain.  You develop a fever.  Your symptoms do not begin to resolve within 3 days. SEEK IMMEDIATE MEDICAL CARE IF:   You have severe back pain or lower abdominal pain.  You develop chills.  You have nausea or vomiting.  You have continued burning or discomfort with urination. MAKE SURE YOU:   Understand these instructions.  Will watch your condition.  Will get help right away if you are not doing well or get worse. Document Released: 11/28/2004 Document Revised: 08/20/2011 Document Reviewed: 03/29/2011 [facility] Patient Information 2014 [place], [place].

## 2012-08-24 NOTE — Telephone Encounter (Signed)
refill-hydrochlorothiazide 25mg  tab. Take one tablet by mouth once daily. Qty 30 last fill 6.2.14

## 2012-08-24 NOTE — Assessment & Plan Note (Signed)
Good response to second epidural

## 2012-08-24 NOTE — Progress Notes (Signed)
Patient ID: Brenda [name], female   DOB: 09-06-1945, 67 y.o.   MRN: [id number] ALAN RILES [id number] 06-23-1945 08/24/2012      Progress Note-Follow Up  Subjective  Chief Complaint  Chief Complaint  Patient presents with  . Follow-up    2 month    HPI  Patient is a 42 female in today for followup. She did very pleased with the switch in her medications to Lexapro. She reports her anxiety and depression are both much more manageable. No suicidal ideation and significant improvement and anhedonia. No recent illness. Her reflux and abdominal discomfort is greatly improved with the addition of probiotics. No fevers, chest pain, palpitations, shortness of breath, GI as noted. Taking medications as prescribed  Past Medical History  Diagnosis Date  . History of chicken pox     childhood age 62  . Depression     counseling  . GERD (gastroesophageal reflux disease)   . Seasonal allergies   . Hypertension   . Hyperlipidemia     2011  . Other and unspecified hyperlipidemia 04/26/2012  . Other and unspecified hyperlipidemia 08/24/2012  . Urinary incontinence 08/24/2012  . Esophageal reflux 08/24/2012    Past Surgical History  Procedure Laterality Date  . Total abdominal hysterectomy      2002  . Bladder repair      2002    Family History  Problem Relation Age of Onset  . Colon cancer Sister 69  . Breast cancer Sister 91  . Heart disease Father 29  . Hypertension Father     History   Social History  . Marital Status: Divorced    Spouse Name: N/A    Number of Children: N/A  . Years of Education: N/A   Occupational History  . Not on file.   Social History Main Topics  . Smoking status: Former Games developer  . Smokeless tobacco: Never Used     Comment: Qquit 10 years ago  . Alcohol Use: No  . Drug Use: No  . Sexually Active: Not on file   Other Topics Concern  . Not on file   Social History Narrative  . No narrative on file    Current Outpatient Prescriptions on  File Prior to Visit  Medication Sig Dispense Refill  . cyclobenzaprine (FLEXERIL) 5 MG tablet Take 1 tablet (5 mg total) by mouth at bedtime as needed for muscle spasms.  30 tablet  1  . fish oil-omega-3 fatty acids 1000 MG capsule Take 2 g by mouth daily.      . hydrochlorothiazide (HYDRODIURIL) 25 MG tablet TAKE ONE TABLET BY MOUTH ONCE DAILY  30 tablet  0  . losartan (COZAAR) 50 MG tablet Take 1 tablet (50 mg total) by mouth daily.  30 tablet  4  . naproxen sodium (ANAPROX) 220 MG tablet Take 220 mg by mouth 2 (two) times daily with a meal.      . pravastatin (PRAVACHOL) 20 MG tablet Take 1 tablet (20 mg total) by mouth daily.  30 tablet  5  . ranitidine (ZANTAC) 150 MG capsule Take 1 capsule (150 mg total) by mouth 2 (two) times daily.  60 capsule  5   No current facility-administered medications on file prior to visit.    Allergies  Allergen Reactions  . Mobic (Meloxicam) Nausea Only    Review of Systems  Review of Systems  Constitutional: Negative for fever and malaise/fatigue.  HENT: Negative for congestion.   Eyes: Negative for pain and discharge.  Respiratory: Negative for shortness of breath.   Cardiovascular: Negative for chest pain, palpitations and leg swelling.  Gastrointestinal: Negative for nausea, abdominal pain and diarrhea.  Genitourinary: Negative for dysuria.  Musculoskeletal: Negative for falls.  Skin: Negative for rash.  Neurological: Negative for loss of consciousness and headaches.  Endo/Heme/Allergies: Negative for polydipsia.  Psychiatric/Behavioral: Negative for depression and suicidal ideas. The patient is not nervous/anxious and does not have insomnia.     Objective  BP 108/80  Pulse 80  Temp(Src) 98.7 F (37.1 C) (Oral)  Ht 5\' 8"  (1.727 m)  Wt 197 lb (89.359 kg)  BMI 29.96 kg/m2  SpO2 88%  Physical Exam  Physical Exam  Constitutional: She is oriented to person, place, and time and well-developed, well-nourished, and in no distress. No  distress.  HENT:  Head: Normocephalic and atraumatic.  Eyes: Conjunctivae are normal.  Neck: Neck supple. No thyromegaly present.  Cardiovascular: Normal rate and regular rhythm.  Exam reveals no gallop.   No murmur heard. Pulmonary/Chest: Effort normal and breath sounds normal. She has no wheezes.  Abdominal: She exhibits no distension and no mass.  Musculoskeletal: She exhibits no edema.  Lymphadenopathy:    She has no cervical adenopathy.  Neurological: She is alert and oriented to person, place, and time.  Skin: Skin is warm and dry. No rash noted. She is not diaphoretic.  Psychiatric: Memory, affect and judgment normal.    Lab Results  Component Value Date   TSH 0.803 04/20/2012   Lab Results  Component Value Date   WBC 4.3 11/25/2011   HGB 14.5 11/25/2011   HCT 41.3 11/25/2011   MCV 86.6 11/25/2011   PLT 328 11/25/2011   Lab Results  Component Value Date   CREATININE 0.67 04/13/2012   BUN 22 04/13/2012   NA 139 04/13/2012   K 4.6 04/13/2012   CL 106 04/13/2012   CO2 26 04/13/2012   Lab Results  Component Value Date   ALT 12 11/25/2011   AST 15 11/25/2011   ALKPHOS 69 11/25/2011   BILITOT 0.4 11/25/2011   Lab Results  Component Value Date   CHOL 175 11/25/2011   Lab Results  Component Value Date   HDL 63 11/25/2011   Lab Results  Component Value Date   LDLCALC 99 11/25/2011   Lab Results  Component Value Date   TRIG 65 11/25/2011   Lab Results  Component Value Date   CHOLHDL 2.8 11/25/2011     Assessment & Plan  Anxiety Good result from switch from Prozac to Lexapro. Continue same  Hypertension Well controlled, no changes  Urinary incontinence Check a UA and culture, recommend kegel exercise  Lumbar disc disease with radiculopathy Good response to second epidural  Esophageal reflux Good response to probiotics, may use Zantac prn

## 2012-08-25 LAB — URINE CULTURE
Colony Count: NO GROWTH
Organism ID, Bacteria: NO GROWTH

## 2012-08-25 LAB — URINALYSIS
Bilirubin Urine: NEGATIVE
Glucose, UA: NEGATIVE mg/dL
Leukocytes, UA: NEGATIVE
Specific Gravity, Urine: 1.021 (ref 1.005–1.030)
pH: 6 (ref 5.0–8.0)

## 2012-08-26 LAB — HEMOGLOBIN A1C: Mean Plasma Glucose: 123 mg/dL — ABNORMAL HIGH (ref <117)

## 2012-10-25 ENCOUNTER — Other Ambulatory Visit: Payer: Self-pay | Admitting: Internal Medicine

## 2012-10-26 ENCOUNTER — Other Ambulatory Visit: Payer: Self-pay | Admitting: *Deleted

## 2012-10-26 DIAGNOSIS — I10 Essential (primary) hypertension: Secondary | ICD-10-CM

## 2012-10-26 MED ORDER — LOSARTAN POTASSIUM 50 MG PO TABS: 50.0000 mg | ORAL_TABLET | Freq: Every day | ORAL | Status: DC

## 2012-10-26 NOTE — Telephone Encounter (Signed)
Rx request to pharmacy/[facility]  

## 2012-11-17 ENCOUNTER — Telehealth: Payer: Self-pay | Admitting: Internal Medicine

## 2012-11-17 NOTE — Telephone Encounter (Signed)
Patient says that she has been taking 4-6 ibuprofen daily and the pain is not going away. She would like something stronger called into [facility] on [address]

## 2012-11-17 NOTE — Telephone Encounter (Signed)
Please advise 

## 2012-11-17 NOTE — Telephone Encounter (Signed)
I have not seen her since June, is she talking about back pain still or something else? If pain that bad she should come in for further evaluation and imaging. Should check labs including a renal panel due to her Ibuprofen use, check some images and give her some pain pills at that time

## 2012-11-17 NOTE — Telephone Encounter (Signed)
Left a detailed message on patients vm 

## 2012-12-02 ENCOUNTER — Encounter: Payer: Self-pay | Admitting: Family Medicine

## 2013-01-25 ENCOUNTER — Telehealth: Payer: Self-pay | Admitting: Family Medicine

## 2013-01-25 ENCOUNTER — Telehealth: Payer: Self-pay

## 2013-01-25 ENCOUNTER — Other Ambulatory Visit: Payer: Self-pay | Admitting: *Deleted

## 2013-01-25 ENCOUNTER — Ambulatory Visit (INDEPENDENT_AMBULATORY_CARE_PROVIDER_SITE_OTHER): Payer: Medicare Other | Admitting: Family Medicine

## 2013-01-25 ENCOUNTER — Encounter: Payer: Self-pay | Admitting: Family Medicine

## 2013-01-25 VITALS — BP 102/82 | HR 78 | Temp 97.7°F | Ht 68.0 in | Wt 196.1 lb

## 2013-01-25 DIAGNOSIS — E785 Hyperlipidemia, unspecified: Secondary | ICD-10-CM

## 2013-01-25 DIAGNOSIS — M5126 Other intervertebral disc displacement, lumbar region: Secondary | ICD-10-CM

## 2013-01-25 DIAGNOSIS — R7309 Other abnormal glucose: Secondary | ICD-10-CM

## 2013-01-25 DIAGNOSIS — I10 Essential (primary) hypertension: Secondary | ICD-10-CM

## 2013-01-25 DIAGNOSIS — M25559 Pain in unspecified hip: Secondary | ICD-10-CM

## 2013-01-25 DIAGNOSIS — Z23 Encounter for immunization: Secondary | ICD-10-CM

## 2013-01-25 DIAGNOSIS — G8929 Other chronic pain: Secondary | ICD-10-CM

## 2013-01-25 DIAGNOSIS — R739 Hyperglycemia, unspecified: Secondary | ICD-10-CM

## 2013-01-25 DIAGNOSIS — M5116 Intervertebral disc disorders with radiculopathy, lumbar region: Secondary | ICD-10-CM

## 2013-01-25 LAB — RENAL FUNCTION PANEL
Albumin: 4.4 g/dL (ref 3.5–5.2)
Creat: 0.78 mg/dL (ref 0.50–1.10)
Phosphorus: 3.5 mg/dL (ref 2.3–4.6)
Potassium: 4.2 mEq/L (ref 3.5–5.3)
Sodium: 136 mEq/L (ref 135–145)

## 2013-01-25 LAB — LIPID PANEL
LDL Cholesterol: 148 mg/dL — ABNORMAL HIGH (ref 0–99)
Triglycerides: 112 mg/dL (ref <150)
VLDL: 22 mg/dL (ref 0–40)

## 2013-01-25 LAB — TSH: TSH: 0.778 u[IU]/mL (ref 0.350–4.500)

## 2013-01-25 LAB — HEPATIC FUNCTION PANEL
Albumin: 4.4 g/dL (ref 3.5–5.2)
Alkaline Phosphatase: 72 U/L (ref 39–117)
Bilirubin, Direct: 0.1 mg/dL (ref 0.0–0.3)
Indirect Bilirubin: 0.4 mg/dL (ref 0.0–0.9)
Total Bilirubin: 0.5 mg/dL (ref 0.3–1.2)

## 2013-01-25 LAB — HEMOGLOBIN A1C: Hgb A1c MFr Bld: 6.1 % — ABNORMAL HIGH (ref <5.7)

## 2013-01-25 MED ORDER — CYCLOBENZAPRINE HCL 5 MG PO TABS: 5.0000 mg | ORAL_TABLET | Freq: Every evening | ORAL | Status: DC | PRN

## 2013-01-25 MED ORDER — RANITIDINE HCL 150 MG PO CAPS: 150.0000 mg | ORAL_CAPSULE | Freq: Two times a day (BID) | ORAL | Status: DC | PRN

## 2013-01-25 MED ORDER — PRAVASTATIN SODIUM 20 MG PO TABS: 20.0000 mg | ORAL_TABLET | Freq: Every day | ORAL | Status: DC

## 2013-01-25 NOTE — Telephone Encounter (Signed)
Rx request to pharmacy/[facility]  

## 2013-01-25 NOTE — Progress Notes (Signed)
Pre visit review using our clinic review tool, if applicable. No additional management support is needed unless otherwise documented below in the visit note. 

## 2013-01-25 NOTE — Telephone Encounter (Signed)
Lab order placed.

## 2013-01-25 NOTE — Patient Instructions (Addendum)
Call ins about the Zostavax/Shingles Start a krill or fish oil caps    Basic Carbohydrate Counting Basic carbohydrate counting is a way to plan meals. It is done by counting the amount of carbohydrate in foods. Foods that have carbohydrates are starches (grains, beans, starchy vegetables) and sweets. Eating carbohydrates increases blood glucose (sugar) levels. People with diabetes use carbohydrate counting to help keep their blood glucose at a normal level.  COUNTING CARBOHYDRATES IN FOODS The first step in counting carbohydrates is to learn how many carbohydrate servings you should have in every meal. A dietitian can plan this for you. After learning the amount of carbohydrates to include in your meal plan, you can start to choose the carbohydrate-containing foods you want to eat.  There are 2 ways to identify the amount of carbohydrates in the foods you eat.  Read the Nutrition Facts panel on food labels. You need 2 pieces of information from the Nutrition Facts panel to count carbohydrates this way:  Serving size.  Total carbohydrate (in grams). Decide how many servings you will be eating. If it is 1 serving, you will be eating the amount of carbohydrate listed on the panel. If you will be eating 2 servings, you will be eating double the amount of carbohydrate listed on the panel.   Learn serving sizes. A serving size of most carbohydrate-containing foods is about 15 grams (g). Listed below are single serving sizes of common carbohydrate-containing foods:  1 slice bread.   cup unsweetened, dry cereal.   cup hot cereal.   cup rice.   cup mashed potatoes.   cup pasta.  1 cup fresh fruit.   cup canned fruit.  1 cup milk (whole, 2%, or skim).   cup starchy vegetables (peas, corn, or potatoes). Counting carbohydrates this way is similar to looking on the Nutrition Facts panel. Decide how many servings you will eat first. Multiply the number of servings you eat by 15 g. For  example, if you have 2 cups of strawberries, you had 2 servings. That means you had 30 g of carbohydrate (2 servings x 15 g = 30 g). CALCULATING CARBOHYDRATES IN A MEAL Sample dinner  3 oz chicken breast.   cup brown rice.   cup corn.  1 cup fat-free milk.  1 cup strawberries with sugar-free whipped topping. Carbohydrate calculation First, identify the foods that contain carbohydrate:  Rice.  Corn.  Milk.  Strawberries. Calculate the number of servings eaten:  2 servings rice.  1 serving corn.  1 serving milk.  1 serving strawberries. Multiply the number of servings by 15 g:  2 servings rice x 15 g = 30 g.  1 serving corn x 15 g = 15 g.  1 serving milk x 15 g = 15 g.  1 serving strawberries x 15 g = 15 g. Add the amounts to find the total carbohydrates eaten: 30 g + 15 g + 15 g + 15 g = 75 g carbohydrate eaten at dinner. Document Released: 02/18/2005 Document Revised: 05/13/2011 Document Reviewed: 01/04/2011 [facility] Patient Information 2014 [place], [place].

## 2013-01-25 NOTE — Telephone Encounter (Signed)
Pt left a vm stating she would like the Flexeril sent to the pharmacy.  i informed pt that this was sent this morning

## 2013-01-25 NOTE — Telephone Encounter (Signed)
LAB ORDER WEEK OF 04-19-2013 visit lipid, renal, cbc, tsh, hepatic, hgba1c

## 2013-01-31 DIAGNOSIS — E669 Obesity, unspecified: Secondary | ICD-10-CM | POA: Insufficient documentation

## 2013-01-31 DIAGNOSIS — E1169 Type 2 diabetes mellitus with other specified complication: Secondary | ICD-10-CM | POA: Insufficient documentation

## 2013-01-31 HISTORY — DX: Type 2 diabetes mellitus with other specified complication: E66.9

## 2013-01-31 HISTORY — DX: Type 2 diabetes mellitus with other specified complication: E11.69

## 2013-01-31 NOTE — Assessment & Plan Note (Signed)
Avoid simple carbs and continue to monitor

## 2013-01-31 NOTE — Assessment & Plan Note (Signed)
Avoid trans fats, Pravastatin 20 mg daily is prescribed

## 2013-01-31 NOTE — Assessment & Plan Note (Addendum)
Patient is still struggling with pain. Has been receiving shots with Dr Penelope Galas with temporary relief. Will try Flexeril prn and refer to PT

## 2013-01-31 NOTE — Progress Notes (Signed)
Patient ID: Brenda [name], female   DOB: [birth year], 67 y.o.   MRN: [id number] Brenda [name] [id number] 04/05/1945 01/31/2013      Progress Note-Follow Up  Subjective  Chief Complaint  Chief Complaint  Patient presents with  . Follow-up    3 month  . Injections    Prevnar    HPI  Patient is a 67 year old in today for followup. Her largest complaint is of persistent back pain and some radicular symptoms. Has been following with orthopedics and getting some shots which did get temporary relief. He can temporarily from Tylenol and Aspercreme as well. No falls or incontinence. No chest pain no palpitations shortness or breath or recent illness. Taking medications as prescribed. Heartburn minimal at this time. Tolerating Zantac  Past Medical History  Diagnosis Date  . History of chicken pox     childhood age 56  . Depression     counseling  . GERD (gastroesophageal reflux disease)   . Seasonal allergies   . Hypertension   . Hyperlipidemia     2011  . Other and unspecified hyperlipidemia 04/26/2012  . Other and unspecified hyperlipidemia 08/24/2012  . Urinary incontinence 08/24/2012  . Esophageal reflux 08/24/2012    Past Surgical History  Procedure Laterality Date  . Total abdominal hysterectomy      2002  . Bladder repair      2002    Family History  Problem Relation Age of Onset  . Colon cancer Sister 38  . Breast cancer Sister 27  . Heart disease Father 76  . Hypertension Father     History   Social History  . Marital Status: Divorced    Spouse Name: N/A    Number of Children: N/A  . Years of Education: N/A   Occupational History  . Not on file.   Social History Main Topics  . Smoking status: Former Games developer  . Smokeless tobacco: Never Used     Comment: Qquit 10 years ago  . Alcohol Use: No  . Drug Use: No  . Sexual Activity: Not on file   Other Topics Concern  . Not on file   Social History Narrative  . No narrative on file    Current Outpatient  Prescriptions on File Prior to Visit  Medication Sig Dispense Refill  . escitalopram (LEXAPRO) 20 MG tablet Take 1 tablet (20 mg total) by mouth daily.  30 tablet  3  . hydrochlorothiazide (HYDRODIURIL) 25 MG tablet Take 1 tablet (25 mg total) by mouth daily.  30 tablet  5  . losartan (COZAAR) 50 MG tablet Take 1 tablet (50 mg total) by mouth daily.  30 tablet  4   No current facility-administered medications on file prior to visit.    Allergies  Allergen Reactions  . Mobic [Meloxicam] Nausea Only    Review of Systems  Review of Systems  Constitutional: Negative for fever and malaise/fatigue.  HENT: Negative for congestion.   Eyes: Negative for discharge.  Respiratory: Negative for shortness of breath.   Cardiovascular: Negative for chest pain, palpitations and leg swelling.  Gastrointestinal: Negative for nausea, abdominal pain and diarrhea.  Genitourinary: Negative for dysuria.  Musculoskeletal: Positive for back pain and myalgias. Negative for falls.  Skin: Negative for rash.  Neurological: Negative for loss of consciousness and headaches.  Endo/Heme/Allergies: Negative for polydipsia.  Psychiatric/Behavioral: Negative for depression and suicidal ideas. The patient is not nervous/anxious and does not have insomnia.     Objective  BP 102/82  Pulse  78  Temp(Src) 97.7 F (36.5 C) (Oral)  Ht 5\' 8"  (1.727 m)  Wt 196 lb 1.3 oz (88.941 kg)  BMI 29.82 kg/m2  SpO2 91%  Physical Exam  Physical Exam  Constitutional: She is oriented to person, place, and time and well-developed, well-nourished, and in no distress. No distress.  HENT:  Head: Normocephalic and atraumatic.  Eyes: Conjunctivae are normal.  Neck: Neck supple. No thyromegaly present.  Cardiovascular: Normal rate, regular rhythm and normal heart sounds.   No murmur heard. Pulmonary/Chest: Effort normal and breath sounds normal. She has no wheezes.  Abdominal: She exhibits no distension and no mass.   Musculoskeletal: She exhibits no edema.  Lymphadenopathy:    She has no cervical adenopathy.  Neurological: She is alert and oriented to person, place, and time.  Skin: Skin is warm and dry. No rash noted. She is not diaphoretic.  Psychiatric: Memory, affect and judgment normal.    Lab Results  Component Value Date   TSH 0.778 01/25/2013   Lab Results  Component Value Date   WBC 6.1 08/24/2012   HGB 14.8 08/24/2012   HCT 42.4 08/24/2012   MCV 84.6 08/24/2012   PLT 341 08/24/2012   Lab Results  Component Value Date   CREATININE 0.78 01/25/2013   BUN 22 01/25/2013   NA 136 01/25/2013   K 4.2 01/25/2013   CL 100 01/25/2013   CO2 28 01/25/2013   Lab Results  Component Value Date   ALT 12 01/25/2013   AST 13 01/25/2013   ALKPHOS 72 01/25/2013   BILITOT 0.5 01/25/2013   Lab Results  Component Value Date   CHOL 228* 01/25/2013   Lab Results  Component Value Date   HDL 58 01/25/2013   Lab Results  Component Value Date   LDLCALC 148* 01/25/2013   Lab Results  Component Value Date   TRIG 112 01/25/2013   Lab Results  Component Value Date   CHOLHDL 3.9 01/25/2013     Assessment & Plan  Lumbar disc disease with radiculopathy Patient is still struggling with pain. Has been receiving shots with Dr Penelope Galas with temporary relief. Will try Flexeril prn and refer to PT  Other and unspecified hyperlipidemia Avoid trans fats, Pravastatin 20 mg daily is prescribed  Hyperglycemia Avoid simple carbs and continue to monitor

## 2013-02-01 ENCOUNTER — Ambulatory Visit: Payer: Medicare Other | Attending: Family Medicine | Admitting: Physical Therapy

## 2013-02-01 DIAGNOSIS — M545 Low back pain, unspecified: Secondary | ICD-10-CM | POA: Insufficient documentation

## 2013-02-01 DIAGNOSIS — IMO0001 Reserved for inherently not codable concepts without codable children: Secondary | ICD-10-CM | POA: Insufficient documentation

## 2013-02-05 ENCOUNTER — Encounter: Payer: Medicare Other | Admitting: Physical Therapy

## 2013-02-08 ENCOUNTER — Ambulatory Visit: Payer: Medicare Other | Admitting: Physical Therapy

## 2013-02-12 ENCOUNTER — Encounter: Payer: Medicare Other | Admitting: Rehabilitation

## 2013-02-15 ENCOUNTER — Ambulatory Visit: Payer: Medicare Other | Admitting: Physical Therapy

## 2013-02-15 ENCOUNTER — Other Ambulatory Visit: Payer: Self-pay | Admitting: Family Medicine

## 2013-02-15 NOTE — Telephone Encounter (Signed)
Escitalopram refilled per protocol. JG//CMA 

## 2013-02-19 ENCOUNTER — Encounter: Payer: Medicare Other | Admitting: Physical Therapy

## 2013-02-22 ENCOUNTER — Ambulatory Visit: Payer: Medicare Other | Admitting: Rehabilitation

## 2013-03-01 ENCOUNTER — Ambulatory Visit: Payer: Medicare Other | Admitting: Physical Therapy

## 2013-03-05 ENCOUNTER — Other Ambulatory Visit: Payer: Self-pay | Admitting: Family Medicine

## 2013-03-05 NOTE — Telephone Encounter (Signed)
Rx request to pharmacy/[facility]  

## 2013-03-25 ENCOUNTER — Other Ambulatory Visit: Payer: Self-pay | Admitting: Family Medicine

## 2013-03-25 NOTE — Telephone Encounter (Signed)
Last OV: 01/25/2013  Last filled: 01/25/2013 #30 1 refill  Please advise.

## 2013-03-31 ENCOUNTER — Other Ambulatory Visit: Payer: Self-pay | Admitting: Family Medicine

## 2013-03-31 NOTE — Telephone Encounter (Signed)
Rx request to pharmacy/[facility]  

## 2013-04-01 ENCOUNTER — Other Ambulatory Visit: Payer: Self-pay | Admitting: Family Medicine

## 2013-04-19 LAB — LIPID PANEL
Cholesterol: 191 mg/dL (ref 0–200)
HDL: 51 mg/dL (ref >39)
LDL Cholesterol: 117 mg/dL — ABNORMAL HIGH (ref 0–99)
Total CHOL/HDL Ratio: 3.7 Ratio
Triglycerides: 115 mg/dL (ref <150)
VLDL: 23 mg/dL (ref 0–40)

## 2013-04-19 LAB — CBC
HCT: 42.6 % (ref 36.0–46.0)
Hemoglobin: 14.5 g/dL (ref 12.0–15.0)
MCH: 29.5 pg (ref 26.0–34.0)
MCHC: 34 g/dL (ref 30.0–36.0)
MCV: 86.8 fL (ref 78.0–100.0)
Platelets: 399 10*3/uL (ref 150–400)
RBC: 4.91 MIL/uL (ref 3.87–5.11)
RDW: 14.7 % (ref 11.5–15.5)
WBC: 4.4 10*3/uL (ref 4.0–10.5)

## 2013-04-19 LAB — HEPATIC FUNCTION PANEL
ALT: 12 U/L (ref 0–35)
AST: 13 U/L (ref 0–37)
Albumin: 4.4 g/dL (ref 3.5–5.2)
Alkaline Phosphatase: 66 U/L (ref 39–117)
Bilirubin, Direct: 0.1 mg/dL (ref 0.0–0.3)
Indirect Bilirubin: 0.3 mg/dL (ref 0.2–1.2)
Total Bilirubin: 0.4 mg/dL (ref 0.2–1.2)
Total Protein: 7.1 g/dL (ref 6.0–8.3)

## 2013-04-19 LAB — RENAL FUNCTION PANEL
Albumin: 4.4 g/dL (ref 3.5–5.2)
BUN: 19 mg/dL (ref 6–23)
CO2: 28 mEq/L (ref 19–32)
Calcium: 9.2 mg/dL (ref 8.4–10.5)
Chloride: 102 mEq/L (ref 96–112)
Creat: 0.7 mg/dL (ref 0.50–1.10)
Glucose, Bld: 104 mg/dL — ABNORMAL HIGH (ref 70–99)
Phosphorus: 3.5 mg/dL (ref 2.3–4.6)
Potassium: 4.7 mEq/L (ref 3.5–5.3)
Sodium: 137 mEq/L (ref 135–145)

## 2013-04-19 LAB — HEMOGLOBIN A1C
Hgb A1c MFr Bld: 6.3 % — ABNORMAL HIGH (ref <5.7)
Mean Plasma Glucose: 134 mg/dL — ABNORMAL HIGH (ref <117)

## 2013-04-20 LAB — TSH: TSH: 1.228 u[IU]/mL (ref 0.350–4.500)

## 2013-04-26 ENCOUNTER — Encounter: Payer: Self-pay | Admitting: Family Medicine

## 2013-04-26 ENCOUNTER — Ambulatory Visit (INDEPENDENT_AMBULATORY_CARE_PROVIDER_SITE_OTHER): Payer: Medicare Other | Admitting: Family Medicine

## 2013-04-26 ENCOUNTER — Telehealth: Payer: Self-pay | Admitting: Family Medicine

## 2013-04-26 ENCOUNTER — Ambulatory Visit: Payer: Medicare Other | Admitting: Family Medicine

## 2013-04-26 ENCOUNTER — Other Ambulatory Visit (HOSPITAL_COMMUNITY)
Admission: RE | Admit: 2013-04-26 | Discharge: 2013-04-26 | Disposition: A | Discharge status: Home / Self Care | Payer: Medicare Other | Source: Ambulatory Visit | Attending: Family Medicine | Admitting: Family Medicine

## 2013-04-26 VITALS — BP 128/88 | HR 91 | Temp 97.7°F | Ht 68.0 in | Wt 205.1 lb

## 2013-04-26 DIAGNOSIS — I10 Essential (primary) hypertension: Secondary | ICD-10-CM

## 2013-04-26 DIAGNOSIS — Z124 Encounter for screening for malignant neoplasm of cervix: Secondary | ICD-10-CM

## 2013-04-26 DIAGNOSIS — R739 Hyperglycemia, unspecified: Secondary | ICD-10-CM

## 2013-04-26 DIAGNOSIS — Z0000 Encounter for general adult medical examination without abnormal findings: Secondary | ICD-10-CM

## 2013-04-26 DIAGNOSIS — R7309 Other abnormal glucose: Secondary | ICD-10-CM

## 2013-04-26 DIAGNOSIS — E785 Hyperlipidemia, unspecified: Secondary | ICD-10-CM

## 2013-04-26 DIAGNOSIS — K219 Gastro-esophageal reflux disease without esophagitis: Secondary | ICD-10-CM

## 2013-04-26 HISTORY — DX: Encounter for screening for malignant neoplasm of cervix: Z12.4

## 2013-04-26 NOTE — Progress Notes (Signed)
Pre visit review using our clinic review tool, if applicable. No additional management support is needed unless otherwise documented below in the visit note. 

## 2013-04-26 NOTE — Patient Instructions (Signed)
DASH Diet  The DASH diet stands for "Dietary Approaches to Stop Hypertension." It is a healthy eating plan that has been shown to reduce high blood pressure (hypertension) in as little as 14 days, while also possibly providing other significant health benefits. These other health benefits include reducing the risk of breast cancer after menopause and reducing the risk of type 2 diabetes, heart disease, colon cancer, and stroke. Health benefits also include weight loss and slowing kidney failure in patients with chronic kidney disease.   DIET GUIDELINES  · Limit salt (sodium). Your diet should contain less than 1500 mg of sodium daily.  · Limit refined or processed carbohydrates. Your diet should include mostly whole grains. Desserts and added sugars should be used sparingly.  · Include small amounts of heart-healthy fats. These types of fats include nuts, oils, and tub margarine. Limit saturated and trans fats. These fats have been shown to be harmful in the body.  CHOOSING FOODS   The following food groups are based on a 2000 calorie diet. See your Registered Dietitian for individual calorie needs.  Grains and Grain Products (6 to 8 servings daily)  · Eat More Often: Whole-wheat bread, brown rice, whole-grain or wheat pasta, quinoa, popcorn without added fat or salt (air popped).  · Eat Less Often: White bread, white pasta, white rice, cornbread.  Vegetables (4 to 5 servings daily)  · Eat More Often: Fresh, frozen, and canned vegetables. Vegetables may be raw, steamed, roasted, or grilled with a minimal amount of fat.  · Eat Less Often/Avoid: Creamed or fried vegetables. Vegetables in a cheese sauce.  Fruit (4 to 5 servings daily)  · Eat More Often: All fresh, canned (in natural juice), or frozen fruits. Dried fruits without added sugar. One hundred percent fruit juice (½ cup [237 mL] daily).  · Eat Less Often: Dried fruits with added sugar. Canned fruit in light or heavy syrup.  Lean Meats, Fish, and Poultry (2  servings or less daily. One serving is 3 to 4 oz [85-114 g]).  · Eat More Often: Ninety percent or leaner ground beef, tenderloin, sirloin. Round cuts of beef, chicken breast, turkey breast. All fish. Grill, bake, or broil your meat. Nothing should be fried.  · Eat Less Often/Avoid: Fatty cuts of meat, turkey, or chicken leg, thigh, or wing. Fried cuts of meat or fish.  Dairy (2 to 3 servings)  · Eat More Often: Low-fat or fat-free milk, low-fat plain or light yogurt, reduced-fat or part-skim cheese.  · Eat Less Often/Avoid: Milk (whole, 2%). Whole milk yogurt. Full-fat cheeses.  Nuts, Seeds, and Legumes (4 to 5 servings per week)  · Eat More Often: All without added salt.  · Eat Less Often/Avoid: Salted nuts and seeds, canned beans with added salt.  Fats and Sweets (limited)  · Eat More Often: Vegetable oils, tub margarines without trans fats, sugar-free gelatin. Mayonnaise and salad dressings.  · Eat Less Often/Avoid: Coconut oils, palm oils, butter, stick margarine, cream, half and half, cookies, candy, pie.  FOR MORE INFORMATION  The Dash Diet Eating Plan: www.dashdiet.org  Document Released: 02/07/2011 Document Revised: 05/13/2011 Document Reviewed: 02/07/2011  [employer]® Patient Information ©2014 [employer].

## 2013-04-26 NOTE — Telephone Encounter (Signed)
Lab order placed.

## 2013-04-26 NOTE — Telephone Encounter (Signed)
Lab order week of 08-16-2013 Check out comments: Labs prior lipid, renal, cbc, tsh, hgba1c, hepatic

## 2013-05-02 ENCOUNTER — Encounter: Payer: Self-pay | Admitting: Family Medicine

## 2013-05-02 NOTE — Assessment & Plan Note (Signed)
Minimize simple carbs. Monitor

## 2013-05-02 NOTE — Assessment & Plan Note (Signed)
Tolerating pravastatin, avoid trans fats.

## 2013-05-02 NOTE — Assessment & Plan Note (Signed)
Well controlled no changes 

## 2013-05-02 NOTE — Progress Notes (Signed)
Patient ID: ZAHRIA DING, female   DOB: [birth year], 68 y.o.   MRN: [id number] NYSHA KOPLIN [id number] [birth year] 05/02/2013      Progress Note-Follow Up  Subjective  Chief Complaint  Chief Complaint  Patient presents with  . Gynecologic Exam    pap and breast exam    HPI  Patient is a 68 year old female who is in today for GYN exam. No recent illness. No GI or GU complaints. No chest pain, palpitations or shortness of breath. Taking medications as prescribed. No headache or fevers. Depression well-controlled. Heartburnbgenerally well controlled  Past Medical History  Diagnosis Date  . History of chicken pox     childhood age 12  . Depression     counseling  . GERD (gastroesophageal reflux disease)   . Seasonal allergies   . Hypertension   . Hyperlipidemia     2011  . Other and unspecified hyperlipidemia 04/26/2012  . Other and unspecified hyperlipidemia 08/24/2012  . Urinary incontinence 08/24/2012  . Esophageal reflux 08/24/2012    Past Surgical History  Procedure Laterality Date  . Total abdominal hysterectomy      2002  . Bladder repair      2002    Family History  Problem Relation Age of Onset  . Colon cancer Sister 86  . Breast cancer Sister 23  . Heart disease Father 45  . Hypertension Father   . Dementia Mother   . Cancer Paternal Grandfather     bone    History   Social History  . Marital Status: Divorced    Spouse Name: N/A    Number of Children: N/A  . Years of Education: N/A   Occupational History  . Not on file.   Social History Main Topics  . Smoking status: Former Research scientist (life sciences)  . Smokeless tobacco: Never Used     Comment: Qquit 10 years ago  . Alcohol Use: No  . Drug Use: No  . Sexual Activity: Not on file   Other Topics Concern  . Not on file   Social History Narrative  . No narrative on file    Current Outpatient Prescriptions on File Prior to Visit  Medication Sig Dispense Refill  . cyclobenzaprine (FLEXERIL) 5 MG tablet TAKE ONE  TABLET BY MOUTH AT BEDTIME AS NEEDED FOR MUSCLE SPASM  30 tablet  0  . escitalopram (LEXAPRO) 20 MG tablet TAKE ONE TABLET BY MOUTH ONCE DAILY  30 tablet  2  . hydrochlorothiazide (HYDRODIURIL) 25 MG tablet TAKE ONE TABLET BY MOUTH ONCE DAILY  30 tablet  2  . losartan (COZAAR) 50 MG tablet TAKE ONE TABLET BY MOUTH ONCE DAILY  30 tablet  0  . pravastatin (PRAVACHOL) 20 MG tablet Take 1 tablet (20 mg total) by mouth daily.  30 tablet  5  . ranitidine (ZANTAC) 150 MG capsule Take 1 capsule (150 mg total) by mouth 2 (two) times daily as needed for heartburn.  60 capsule  3   No current facility-administered medications on file prior to visit.    Allergies  Allergen Reactions  . Mobic [Meloxicam] Nausea Only    Review of Systems  Review of Systems  Constitutional: Negative for fever and malaise/fatigue.  HENT: Negative for congestion.   Eyes: Negative for discharge.  Respiratory: Negative for shortness of breath.   Cardiovascular: Negative for chest pain, palpitations and leg swelling.  Gastrointestinal: Negative for nausea, abdominal pain and diarrhea.  Genitourinary: Negative for dysuria.  Musculoskeletal: Negative for falls.  Skin: Negative  for rash.  Neurological: Negative for loss of consciousness and headaches.  Endo/Heme/Allergies: Negative for polydipsia.  Psychiatric/Behavioral: Negative for depression and suicidal ideas. The patient is not nervous/anxious and does not have insomnia.     Objective  BP 128/88  Pulse 91  Temp(Src) 97.7 F (36.5 C) (Oral)  Ht 5\' 8"  (1.727 m)  Wt 205 lb 1.9 oz (93.042 kg)  BMI 31.20 kg/m2  SpO2 93%  Physical Exam  Physical Exam  Constitutional: She is oriented to person, place, and time and well-developed, well-nourished, and in no distress. No distress.  HENT:  Head: Normocephalic and atraumatic.  Right Ear: External ear normal.  Left Ear: External ear normal.  Nose: Nose normal.  Mouth/Throat: Oropharynx is clear and moist. No  oropharyngeal exudate.  Eyes: Conjunctivae are normal. Pupils are equal, round, and reactive to light. Right eye exhibits no discharge. Left eye exhibits no discharge. No scleral icterus.  Neck: Normal range of motion. Neck supple. No thyromegaly present.  Cardiovascular: Normal rate, regular rhythm, normal heart sounds and intact distal pulses.   No murmur heard. Pulmonary/Chest: Effort normal and breath sounds normal. No respiratory distress. She has no wheezes. She has no rales.  Abdominal: Soft. Bowel sounds are normal. She exhibits no distension and no mass. There is no tenderness.  Genitourinary: Vagina normal, right adnexa normal and left adnexa normal. No vaginal discharge found.  Musculoskeletal: Normal range of motion. She exhibits no edema and no tenderness.  Lymphadenopathy:    She has no cervical adenopathy.  Neurological: She is alert and oriented to person, place, and time. She has normal reflexes. No cranial nerve deficit. Coordination normal.  Skin: Skin is warm and dry. No rash noted. She is not diaphoretic.  Psychiatric: Mood, memory and affect normal.    Lab Results  Component Value Date   TSH 1.228 04/19/2013   Lab Results  Component Value Date   WBC 4.4 04/19/2013   HGB 14.5 04/19/2013   HCT 42.6 04/19/2013   MCV 86.8 04/19/2013   PLT 399 04/19/2013   Lab Results  Component Value Date   CREATININE 0.70 04/19/2013   BUN 19 04/19/2013   NA 137 04/19/2013   K 4.7 04/19/2013   CL 102 04/19/2013   CO2 28 04/19/2013   Lab Results  Component Value Date   ALT 12 04/19/2013   AST 13 04/19/2013   ALKPHOS 66 04/19/2013   BILITOT 0.4 04/19/2013   Lab Results  Component Value Date   CHOL 191 04/19/2013   Lab Results  Component Value Date   HDL 51 04/19/2013   Lab Results  Component Value Date   LDLCALC 117* 04/19/2013   Lab Results  Component Value Date   TRIG 115 04/19/2013   Lab Results  Component Value Date   CHOLHDL 3.7 04/19/2013     Assessment &  Plan  Hypertension Well controlled no changes  Cervical cancer screening Pap well controlled no changes  Esophageal reflux Avoid offending foods, continue probiotics.  Other and unspecified hyperlipidemia Tolerating pravastatin, avoid trans fats.   Hyperglycemia Minimize simple carbs. Monitor

## 2013-05-02 NOTE — Assessment & Plan Note (Signed)
Avoid offending foods, continue probiotics.

## 2013-05-02 NOTE — Assessment & Plan Note (Signed)
Pap well controlled no changes

## 2013-05-13 ENCOUNTER — Other Ambulatory Visit: Payer: Self-pay | Admitting: Family Medicine

## 2013-05-13 NOTE — Telephone Encounter (Signed)
Refill request for cyclobenzaprine Last filled by MD on - 03/25/2013 #30 x0 Last Appt: 04/26/2013 Next Appt: 08/23/2013 Please advise refill?

## 2013-05-14 ENCOUNTER — Telehealth: Payer: Self-pay

## 2013-05-14 NOTE — Telephone Encounter (Signed)
Pt states she was going to have to pay $45 for Ranitidine?  Pt would like something sent in that is cheaper?  Please advise?

## 2013-05-16 NOTE — Telephone Encounter (Signed)
That is unbelievable, see if 2 150s is cheaper and she should try a different pharmacy that is generally a med that costs a couple dollars. See how much it would cost her at [facility] just to give her an idea. Or could switch to Famotidine 20 mg tab po bid #[phone] rf

## 2013-05-17 ENCOUNTER — Telehealth: Payer: Self-pay | Admitting: Family Medicine

## 2013-05-17 NOTE — Telephone Encounter (Signed)
Pt informed and is going to check around to other pharmacies and then give [place] a call back

## 2013-05-17 NOTE — Telephone Encounter (Signed)
RANITIDINE 150 MG  HER INSURANCE WILL NO LONGER PAY FOR THIS MED.  iS THERE SOMETHING ELSE THAT DR B COULD CALL IN FOR HER THAT IS SIMILIAR

## 2013-05-17 NOTE — Telephone Encounter (Signed)
Please check and make sure other notes are not already opened before starting a phone note

## 2013-05-25 ENCOUNTER — Other Ambulatory Visit: Payer: Self-pay | Admitting: Family Medicine

## 2013-06-02 ENCOUNTER — Other Ambulatory Visit: Payer: Self-pay | Admitting: Family Medicine

## 2013-06-11 ENCOUNTER — Telehealth: Payer: Self-pay | Admitting: *Deleted

## 2013-06-11 MED ORDER — RANITIDINE HCL 150 MG PO CAPS: 150.0000 mg | ORAL_CAPSULE | Freq: Two times a day (BID) | ORAL | Status: DC | PRN

## 2013-06-11 NOTE — Telephone Encounter (Signed)
Pt left message on voicemail requesting refill of ranitidine 150mg . Pt has follow up in June. Refill sent. Notified pt.

## 2013-06-14 ENCOUNTER — Other Ambulatory Visit: Payer: Self-pay

## 2013-06-14 MED ORDER — RANITIDINE HCL 150 MG PO CAPS: 150.0000 mg | ORAL_CAPSULE | Freq: Two times a day (BID) | ORAL | Status: DC | PRN

## 2013-06-16 NOTE — Telephone Encounter (Signed)
Lab order

## 2013-06-25 ENCOUNTER — Other Ambulatory Visit: Payer: Self-pay | Admitting: Family Medicine

## 2013-06-26 ENCOUNTER — Other Ambulatory Visit: Payer: Self-pay | Admitting: Family Medicine

## 2013-06-28 ENCOUNTER — Other Ambulatory Visit: Payer: Self-pay | Admitting: Family Medicine

## 2013-06-28 ENCOUNTER — Telehealth: Payer: Self-pay

## 2013-06-28 NOTE — Telephone Encounter (Signed)
Pt left a message stating that ranitidine 150 mg was sent into the pharmacy and that this is wrong? Pt stated in message that she wanted Nizatidine 150 mg or Samotivine 150 mg?  Please advise?

## 2013-06-28 NOTE — Telephone Encounter (Signed)
She has had the Ranitidine in the past dating back to 2013 way before I started here. I looked through her chart and can find nothing that sounds remotely like the other 2 she mentioned. Please see what she wants it for and maybe check with her pharmacy to see  What she has been getting.

## 2013-06-29 NOTE — Telephone Encounter (Signed)
Left a detailed message on v/m

## 2013-08-02 ENCOUNTER — Other Ambulatory Visit: Payer: Self-pay | Admitting: Family Medicine

## 2013-08-04 ENCOUNTER — Other Ambulatory Visit: Payer: Self-pay | Admitting: Family Medicine

## 2013-08-04 NOTE — Telephone Encounter (Signed)
Medication name:  Name from pharmacy:  cyclobenzaprine (FLEXERIL) 5 MG tablet  CYCLOBENZAPR 5MG  TAB Sig: TAKE ONE TABLET BY MOUTH AT BEDTIME AS NEEDED FOR MUSCLE SPASM Dispense: 30 tablet Refills: 0 Start: 08/04/2013 Class: Normal Requested on: 05/13/2013 Originally ordered on: 01/20/2012 Last refill: 05/13/2013

## 2013-08-08 ENCOUNTER — Other Ambulatory Visit: Payer: Self-pay | Admitting: Family Medicine

## 2013-08-20 LAB — CBC
HCT: 41.4 % (ref 36.0–46.0)
HEMOGLOBIN: 14.2 g/dL (ref 12.0–15.0)
MCH: 29.3 pg (ref 26.0–34.0)
MCHC: 34.3 g/dL (ref 30.0–36.0)
MCV: 85.4 fL (ref 78.0–100.0)
PLATELETS: 316 10*3/uL (ref 150–400)
RBC: 4.85 MIL/uL (ref 3.87–5.11)
RDW: 15.3 % (ref 11.5–15.5)
WBC: 4.1 10*3/uL (ref 4.0–10.5)

## 2013-08-21 LAB — HEPATIC FUNCTION PANEL
ALT: 11 U/L (ref 0–35)
AST: 16 U/L (ref 0–37)
Albumin: 4.1 g/dL (ref 3.5–5.2)
Alkaline Phosphatase: 63 U/L (ref 39–117)
BILIRUBIN DIRECT: 0.1 mg/dL (ref 0.0–0.3)
Indirect Bilirubin: 0.4 mg/dL (ref 0.2–1.2)
Total Bilirubin: 0.5 mg/dL (ref 0.2–1.2)
Total Protein: 6.9 g/dL (ref 6.0–8.3)

## 2013-08-21 LAB — LIPID PANEL
Cholesterol: 169 mg/dL (ref 0–200)
HDL: 55 mg/dL (ref >39)
LDL CALC: 96 mg/dL (ref 0–99)
TRIGLYCERIDES: 90 mg/dL (ref <150)
Total CHOL/HDL Ratio: 3.1 Ratio
VLDL: 18 mg/dL (ref 0–40)

## 2013-08-21 LAB — TSH: TSH: 0.762 u[IU]/mL (ref 0.350–4.500)

## 2013-08-21 LAB — RENAL FUNCTION PANEL
ALBUMIN: 4.1 g/dL (ref 3.5–5.2)
BUN: 20 mg/dL (ref 6–23)
CALCIUM: 9.2 mg/dL (ref 8.4–10.5)
CO2: 26 mEq/L (ref 19–32)
Chloride: 103 mEq/L (ref 96–112)
Creat: 0.74 mg/dL (ref 0.50–1.10)
Glucose, Bld: 98 mg/dL (ref 70–99)
Phosphorus: 2.9 mg/dL (ref 2.3–4.6)
Potassium: 4.1 mEq/L (ref 3.5–5.3)
Sodium: 139 mEq/L (ref 135–145)

## 2013-08-21 LAB — HEMOGLOBIN A1C
Hgb A1c MFr Bld: 6.4 % — ABNORMAL HIGH (ref <5.7)
Mean Plasma Glucose: 137 mg/dL — ABNORMAL HIGH (ref <117)

## 2013-08-23 ENCOUNTER — Ambulatory Visit: Payer: Medicare Other | Admitting: Family Medicine

## 2013-08-24 ENCOUNTER — Other Ambulatory Visit: Payer: Self-pay | Admitting: Family Medicine

## 2013-08-26 ENCOUNTER — Other Ambulatory Visit: Payer: Self-pay | Admitting: Family Medicine

## 2013-08-27 ENCOUNTER — Encounter: Payer: Self-pay | Admitting: Family Medicine

## 2013-08-27 ENCOUNTER — Ambulatory Visit (INDEPENDENT_AMBULATORY_CARE_PROVIDER_SITE_OTHER): Payer: Medicare Other | Admitting: Family Medicine

## 2013-08-27 VITALS — HR 88 | Temp 97.8°F | Ht 68.0 in | Wt 207.0 lb

## 2013-08-27 DIAGNOSIS — K219 Gastro-esophageal reflux disease without esophagitis: Secondary | ICD-10-CM

## 2013-08-27 DIAGNOSIS — E669 Obesity, unspecified: Secondary | ICD-10-CM

## 2013-08-27 DIAGNOSIS — R7309 Other abnormal glucose: Secondary | ICD-10-CM

## 2013-08-27 DIAGNOSIS — G5600 Carpal tunnel syndrome, unspecified upper limb: Secondary | ICD-10-CM

## 2013-08-27 DIAGNOSIS — G5601 Carpal tunnel syndrome, right upper limb: Secondary | ICD-10-CM

## 2013-08-27 DIAGNOSIS — R739 Hyperglycemia, unspecified: Secondary | ICD-10-CM

## 2013-08-27 DIAGNOSIS — I10 Essential (primary) hypertension: Secondary | ICD-10-CM

## 2013-08-27 MED ORDER — RANITIDINE HCL 300 MG PO CAPS
300.0000 mg | ORAL_CAPSULE | Freq: Every evening | ORAL | Status: DC | PRN
Start: 2013-08-27 — End: 2014-11-11

## 2013-08-27 NOTE — Progress Notes (Signed)
Brenda [name] [id number] [birth year] 08/27/2013      Progress Note-Follow Up  Subjective  Chief Complaint  Chief Complaint  Patient presents with  . Follow-up    4 month    HPI  Patient is a 68 year old female in today for routine medical care. And she is in today for routine followup.unfortunately her mother is hospitalized presently at the age of 101 with pneumonia. She feels she's handling it well with her current anti-anxiety meds and she offers no other acute complaints. No recent illness. Denies CP/palp/SOB/HA/congestion/fevers/GI or GU c/o. Taking meds as prescribed  Past Medical History  Diagnosis Date  . History of chicken pox     childhood age 46  . Depression     counseling  . GERD (gastroesophageal reflux disease)   . Seasonal allergies   . Hypertension   . Hyperlipidemia     2011  . Other and unspecified hyperlipidemia 04/26/2012  . Other and unspecified hyperlipidemia 08/24/2012  . Urinary incontinence 08/24/2012  . Esophageal reflux 08/24/2012    Past Surgical History  Procedure Laterality Date  . Total abdominal hysterectomy      2002  . Bladder repair      2002    Family History  Problem Relation Age of Onset  . Colon cancer Sister 38  . Breast cancer Sister 35  . Heart disease Father 15  . Hypertension Father   . Dementia Mother   . Cancer Paternal Grandfather     bone    History   Social History  . Marital Status: Divorced    Spouse Name: N/A    Number of Children: N/A  . Years of Education: N/A   Occupational History  . Not on file.   Social History Main Topics  . Smoking status: Former Research scientist (life sciences)  . Smokeless tobacco: Never Used     Comment: Qquit 10 years ago  . Alcohol Use: No  . Drug Use: No  . Sexual Activity: Not on file   Other Topics Concern  . Not on file   Social History Narrative  . No narrative on file    Current Outpatient Prescriptions on File Prior to Visit  Medication Sig Dispense Refill  . cyclobenzaprine  (FLEXERIL) 5 MG tablet TAKE ONE TABLET BY MOUTH AT BEDTIME AS NEEDED FOR MUSCLE SPASM  30 tablet  0  . escitalopram (LEXAPRO) 20 MG tablet TAKE ONE TABLET BY MOUTH ONCE DAILY  30 tablet  0  . hydrochlorothiazide (HYDRODIURIL) 25 MG tablet TAKE ONE TABLET BY MOUTH ONCE DAILY  30 tablet  0  . losartan (COZAAR) 50 MG tablet TAKE ONE TABLET BY MOUTH ONCE DAILY  30 tablet  0  . pravastatin (PRAVACHOL) 20 MG tablet TAKE ONE TABLET BY MOUTH ONCE DAILY  30 tablet  0  . ranitidine (ZANTAC) 150 MG capsule Take 1 capsule (150 mg total) by mouth 2 (two) times daily as needed for heartburn.  60 capsule  3   No current facility-administered medications on file prior to visit.    Allergies  Allergen Reactions  . Mobic [Meloxicam] Nausea Only    Review of Systems  Review of Systems  Constitutional: Negative for fever and malaise/fatigue.  HENT: Negative for congestion.   Eyes: Negative for discharge.  Respiratory: Negative for shortness of breath.   Cardiovascular: Negative for chest pain, palpitations and leg swelling.  Gastrointestinal: Negative for nausea, abdominal pain and diarrhea.  Genitourinary: Negative for dysuria.  Musculoskeletal: Negative for falls.  Skin:  Negative for rash.  Neurological: Negative for loss of consciousness and headaches.  Endo/Heme/Allergies: Negative for polydipsia.  Psychiatric/Behavioral: Negative for depression and suicidal ideas. The patient is not nervous/anxious and does not have insomnia.     Objective  Pulse 98  Temp(Src) 97.8 F (36.6 C) (Oral)  Ht 5\' 8"  (1.727 m)  Wt 207 lb (93.895 kg)  BMI 31.48 kg/m2  SpO2 91%  Physical Exam  Physical Exam  Constitutional: She is oriented to person, place, and time and well-developed, well-nourished, and in no distress. No distress.  HENT:  Head: Normocephalic and atraumatic.  Eyes: Conjunctivae are normal.  Neck: Neck supple. No thyromegaly present.  Cardiovascular: Normal rate, regular rhythm and normal  heart sounds.   No murmur heard. Pulmonary/Chest: Effort normal and breath sounds normal. She has no wheezes.  Abdominal: She exhibits no distension and no mass.  Musculoskeletal: She exhibits no edema.  Lymphadenopathy:    She has no cervical adenopathy.  Neurological: She is alert and oriented to person, place, and time.  Skin: Skin is warm and dry. No rash noted. She is not diaphoretic.  Psychiatric: Memory, affect and judgment normal.    Lab Results  Component Value Date   TSH 0.762 08/20/2013   Lab Results  Component Value Date   WBC 4.1 08/20/2013   HGB 14.2 08/20/2013   HCT 41.4 08/20/2013   MCV 85.4 08/20/2013   PLT 316 08/20/2013   Lab Results  Component Value Date   CREATININE 0.74 08/20/2013   BUN 20 08/20/2013   NA 139 08/20/2013   K 4.1 08/20/2013   CL 103 08/20/2013   CO2 26 08/20/2013   Lab Results  Component Value Date   ALT 11 08/20/2013   AST 16 08/20/2013   ALKPHOS 63 08/20/2013   BILITOT 0.5 08/20/2013   Lab Results  Component Value Date   CHOL 169 08/20/2013   Lab Results  Component Value Date   HDL 55 08/20/2013   Lab Results  Component Value Date   LDLCALC 96 08/20/2013   Lab Results  Component Value Date   TRIG 90 08/20/2013   Lab Results  Component Value Date   CHOLHDL 3.1 08/20/2013     Assessment & Plan  Hypertension Well controlled, no changes to meds. Encouraged heart healthy diet such as the DASH diet and exercise as tolerated.   Esophageal reflux Avoid offending foods, start probiotics. Do not eat large meals in late evening and consider raising head of bed. Will switch the Ranitidine to 300 mg po qhs prn  Obesity Encouraged DASH diet, decrease po intake and increase exercise as tolerated. Needs 7-8 hours of sleep nightly. Avoid trans fats, eat small, frequent meals every 4-5 hours with lean proteins, complex carbs and healthy fats. Minimize simple carbs  Carpal tunnel syndrome on right Responds to conservative measures. Ice,  Aspercreme and splinting  Hyperglycemia hgba1c acceptable, minimize simple carbs. Increase exercise as tolerated.  I did not

## 2013-08-27 NOTE — Assessment & Plan Note (Signed)
Encouraged DASH diet, decrease po intake and increase exercise as tolerated. Needs 7-8 hours of sleep nightly. Avoid trans fats, eat small, frequent meals every 4-5 hours with lean proteins, complex carbs and healthy fats. Minimize simple carbs 

## 2013-08-27 NOTE — Assessment & Plan Note (Signed)
hgba1c acceptable, minimize simple carbs. Increase exercise as tolerated.  

## 2013-08-27 NOTE — Patient Instructions (Addendum)

## 2013-08-27 NOTE — Assessment & Plan Note (Signed)
Responds to conservative measures. Ice, Aspercreme and splinting

## 2013-08-27 NOTE — Assessment & Plan Note (Signed)
Well controlled, no changes to meds. Encouraged heart healthy diet such as the DASH diet and exercise as tolerated.  °

## 2013-08-27 NOTE — Assessment & Plan Note (Addendum)
Avoid offending foods, start probiotics. Do not eat large meals in late evening and consider raising head of bed. Will switch the Ranitidine to 300 mg po qhs prn

## 2013-08-27 NOTE — Progress Notes (Signed)
Pre visit review using our clinic review tool, if applicable. No additional management support is needed unless otherwise documented below in the visit note. 

## 2013-08-30 ENCOUNTER — Other Ambulatory Visit: Payer: Self-pay | Admitting: Family Medicine

## 2013-08-30 ENCOUNTER — Telehealth: Payer: Self-pay | Admitting: Family Medicine

## 2013-08-30 NOTE — Telephone Encounter (Signed)
Relevant patient education mailed to patient.  

## 2013-09-01 ENCOUNTER — Other Ambulatory Visit: Payer: Self-pay | Admitting: Family Medicine

## 2013-09-22 ENCOUNTER — Other Ambulatory Visit: Payer: Self-pay | Admitting: Family Medicine

## 2013-09-30 ENCOUNTER — Other Ambulatory Visit: Payer: Self-pay | Admitting: Family Medicine

## 2013-10-04 ENCOUNTER — Other Ambulatory Visit: Payer: Self-pay | Admitting: Family Medicine

## 2013-10-04 NOTE — Telephone Encounter (Signed)
Last RX was done on 09-30-13 To early for refill

## 2013-10-21 ENCOUNTER — Other Ambulatory Visit: Payer: Self-pay | Admitting: Family Medicine

## 2013-11-02 ENCOUNTER — Other Ambulatory Visit: Payer: Self-pay | Admitting: Family Medicine

## 2013-11-10 ENCOUNTER — Other Ambulatory Visit: Payer: Self-pay | Admitting: Family Medicine

## 2013-11-10 NOTE — Telephone Encounter (Signed)
Pt has follow up 12/20/13.  Please advise refills:  Medication name:  Name from pharmacy:  cyclobenzaprine (FLEXERIL) 5 MG tablet  CYCLOBENZAPR 5MG  TAB Sig: TAKE ONE TABLET BY MOUTH AT BEDTIME AS NEEDED FOR MUSCLE SPASM Dispense: 30 tablet Refills: 0 Start: 11/10/2013 Class: Normal Requested on: 09/30/2013 Originally ordered on: 01/20/2012 Last refill: 10/01/2013

## 2013-11-15 ENCOUNTER — Other Ambulatory Visit: Payer: Self-pay | Admitting: Family Medicine

## 2013-11-26 ENCOUNTER — Telehealth: Payer: Self-pay

## 2013-11-26 MED ORDER — ESCITALOPRAM OXALATE 20 MG PO TABS: 20.0000 mg | ORAL_TABLET | Freq: Every day | ORAL | Status: DC

## 2013-11-26 NOTE — Telephone Encounter (Signed)
Brenda [name] self [phone] [place] [place]  [name] called to say she needs a refill for her escitalopram (LEXAPRO) 20 MG tablet

## 2013-12-04 ENCOUNTER — Other Ambulatory Visit: Payer: Self-pay | Admitting: Family Medicine

## 2013-12-06 NOTE — Telephone Encounter (Signed)
Rx request to pharmacy/[facility]  

## 2013-12-09 ENCOUNTER — Encounter: Payer: Self-pay | Admitting: Family Medicine

## 2013-12-20 ENCOUNTER — Ambulatory Visit (INDEPENDENT_AMBULATORY_CARE_PROVIDER_SITE_OTHER): Payer: Medicare Other | Admitting: Family Medicine

## 2013-12-20 ENCOUNTER — Encounter: Payer: Self-pay | Admitting: Family Medicine

## 2013-12-20 VITALS — BP 125/72 | HR 83 | Temp 97.7°F | Ht 68.0 in | Wt 211.4 lb

## 2013-12-20 DIAGNOSIS — R739 Hyperglycemia, unspecified: Secondary | ICD-10-CM

## 2013-12-20 DIAGNOSIS — K219 Gastro-esophageal reflux disease without esophagitis: Secondary | ICD-10-CM

## 2013-12-20 DIAGNOSIS — R0600 Dyspnea, unspecified: Secondary | ICD-10-CM

## 2013-12-20 DIAGNOSIS — Z23 Encounter for immunization: Secondary | ICD-10-CM

## 2013-12-20 DIAGNOSIS — I10 Essential (primary) hypertension: Secondary | ICD-10-CM

## 2013-12-20 DIAGNOSIS — Z87891 Personal history of nicotine dependence: Secondary | ICD-10-CM

## 2013-12-20 DIAGNOSIS — E785 Hyperlipidemia, unspecified: Secondary | ICD-10-CM

## 2013-12-20 DIAGNOSIS — E669 Obesity, unspecified: Secondary | ICD-10-CM

## 2013-12-20 LAB — CBC
HEMATOCRIT: 43.3 % (ref 36.0–46.0)
Hemoglobin: 14.4 g/dL (ref 12.0–15.0)
MCHC: 33.1 g/dL (ref 30.0–36.0)
MCV: 88.5 fl (ref 78.0–100.0)
Platelets: 342 10*3/uL (ref 150.0–400.0)
RBC: 4.89 Mil/uL (ref 3.87–5.11)
RDW: 15.1 % (ref 11.5–15.5)
WBC: 4.4 10*3/uL (ref 4.0–10.5)

## 2013-12-20 LAB — LIPID PANEL
CHOLESTEROL: 164 mg/dL (ref 0–200)
HDL: 41.2 mg/dL (ref >39.00)
LDL Cholesterol: 100 mg/dL — ABNORMAL HIGH (ref 0–99)
NonHDL: 122.8
Total CHOL/HDL Ratio: 4
Triglycerides: 114 mg/dL (ref 0.0–149.0)
VLDL: 22.8 mg/dL (ref 0.0–40.0)

## 2013-12-20 LAB — RENAL FUNCTION PANEL
ALBUMIN: 3.6 g/dL (ref 3.5–5.2)
BUN: 20 mg/dL (ref 6–23)
CALCIUM: 9.1 mg/dL (ref 8.4–10.5)
CO2: 26 mEq/L (ref 19–32)
CREATININE: 0.7 mg/dL (ref 0.4–1.2)
Chloride: 103 mEq/L (ref 96–112)
GFR: 110.6 mL/min (ref >60.00)
Glucose, Bld: 103 mg/dL — ABNORMAL HIGH (ref 70–99)
PHOSPHORUS: 2.6 mg/dL (ref 2.3–4.6)
Potassium: 3.8 mEq/L (ref 3.5–5.1)
SODIUM: 138 meq/L (ref 135–145)

## 2013-12-20 LAB — HEPATIC FUNCTION PANEL
ALT: 14 U/L (ref 0–35)
AST: 16 U/L (ref 0–37)
Albumin: 3.6 g/dL (ref 3.5–5.2)
Alkaline Phosphatase: 81 U/L (ref 39–117)
BILIRUBIN DIRECT: 0 mg/dL (ref 0.0–0.3)
BILIRUBIN TOTAL: 0.5 mg/dL (ref 0.2–1.2)
TOTAL PROTEIN: 7.9 g/dL (ref 6.0–8.3)

## 2013-12-20 LAB — TSH: TSH: 0.38 u[IU]/mL (ref 0.35–4.50)

## 2013-12-20 LAB — HEMOGLOBIN A1C: HEMOGLOBIN A1C: 6.6 % — AB (ref 4.6–6.5)

## 2013-12-20 NOTE — Patient Instructions (Addendum)
Tylenol ES 2 tabs twice daily for a month Curcumen/Turmeric capsule daily   Basic Carbohydrate Counting for Diabetes Mellitus Carbohydrate counting is a method for keeping track of the amount of carbohydrates you eat. Eating carbohydrates naturally increases the level of sugar (glucose) in your blood, so it is important for you to know the amount that is okay for you to have in every meal. Carbohydrate counting helps keep the level of glucose in your blood within normal limits. The amount of carbohydrates allowed is different for every person. A dietitian can help you calculate the amount that is right for you. Once you know the amount of carbohydrates you can have, you can count the carbohydrates in the foods you want to eat. Carbohydrates are found in the following foods:  Grains, such as breads and cereals.  Dried beans and soy products.  Starchy vegetables, such as potatoes, peas, and corn.  Fruit and fruit juices.  Milk and yogurt.  Sweets and snack foods, such as cake, cookies, candy, chips, soft drinks, and fruit drinks. CARBOHYDRATE COUNTING There are two ways to count the carbohydrates in your food. You can use either of the methods or a combination of both. Reading the "Nutrition Facts" on [place] The "Nutrition Facts" is an area that is included on the labels of almost all packaged food and beverages in the [place]. It includes the serving size of that food or beverage and information about the nutrients in each serving of the food, including the grams (g) of carbohydrate per serving.  Decide the number of servings of this food or beverage that you will be able to eat or drink. Multiply that number of servings by the number of grams of carbohydrate that is listed on the label for that serving. The total will be the amount of carbohydrates you will be having when you eat or drink this food or beverage. Learning Standard Serving Sizes of Food When you eat food that is  not packaged or does not include "Nutrition Facts" on the label, you need to measure the servings in order to count the amount of carbohydrates.A serving of most carbohydrate-rich foods contains about 15 g of carbohydrates. The following list includes serving sizes of carbohydrate-rich foods that provide 15 g ofcarbohydrate per serving:   1 slice of bread (1 oz) or 1 six-inch tortilla.    of a hamburger bun or English muffin.  4-6 crackers.   cup unsweetened dry cereal.    cup hot cereal.   cup rice or pasta.    cup mashed potatoes or  of a large baked potato.  1 cup fresh fruit or one small piece of fruit.    cup canned or frozen fruit or fruit juice.  1 cup milk.   cup plain fat-free yogurt or yogurt sweetened with artificial sweeteners.   cup cooked dried beans or starchy vegetable, such as peas, corn, or potatoes.  Decide the number of standard-size servings that you will eat. Multiply that number of servings by 15 (the grams of carbohydrates in that serving). For example, if you eat 2 cups of strawberries, you will have eaten 2 servings and 30 g of carbohydrates (2 servings x 15 g = 30 g). For foods such as soups and casseroles, in which more than one food is mixed in, you will need to count the carbohydrates in each food that is included. EXAMPLE OF CARBOHYDRATE COUNTING Sample Dinner  3 oz chicken breast.   cup of brown rice.  cup of corn.  1 cup milk.   1 cup strawberries with sugar-free whipped topping.  Carbohydrate Calculation Step 1: Identify the foods that contain carbohydrates:   Rice.   Corn.   Milk.   Strawberries. Step 2:Calculate the number of servings eaten of each:   2 servings of rice.   1 serving of corn.   1 serving of milk.   1 serving of strawberries. Step 3: Multiply each of those number of servings by 15 g:   2 servings of rice x 15 g = 30 g.   1 serving of corn x 15 g = 15 g.   1 serving of milk  x 15 g = 15 g.   1 serving of strawberries x 15 g = 15 g. Step 4: Add together all of the amounts to find the total grams of carbohydrates eaten: 30 g + 15 g + 15 g + 15 g = 75 g. Document Released: 02/18/2005 Document Revised: 07/05/2013 Document Reviewed: 01/15/2013 [facility] Patient Information 2015 [place], [place]. This information is not intended to replace advice given to you by your health care provider. Make sure you discuss any questions you have with your health care provider.

## 2013-12-20 NOTE — Assessment & Plan Note (Signed)
Well controlled, no changes to meds. Encouraged heart healthy diet such as the DASH diet and exercise as tolerated.  °

## 2013-12-20 NOTE — Progress Notes (Signed)
Pre visit review using our clinic review tool, if applicable. No additional management support is needed unless otherwise documented below in the visit note. 

## 2013-12-26 ENCOUNTER — Encounter: Payer: Self-pay | Admitting: Family Medicine

## 2013-12-26 DIAGNOSIS — R0600 Dyspnea, unspecified: Secondary | ICD-10-CM | POA: Insufficient documentation

## 2013-12-26 DIAGNOSIS — R0609 Other forms of dyspnea: Secondary | ICD-10-CM | POA: Insufficient documentation

## 2013-12-26 HISTORY — DX: Other forms of dyspnea: R06.09

## 2013-12-26 NOTE — Assessment & Plan Note (Signed)
Encouraged DASH diet, decrease po intake and increase exercise as tolerated. Needs 7-8 hours of sleep nightly. Avoid trans fats, eat small, frequent meals every 4-5 hours with lean proteins, complex carbs and healthy fats. Minimize simple carbs, GMO foods. 

## 2013-12-26 NOTE — Assessment & Plan Note (Signed)
Tolerating statin, encouraged heart healthy diet, avoid trans fats, minimize simple carbs and saturated fats. Increase exercise as tolerated 

## 2013-12-26 NOTE — Progress Notes (Signed)
Patient ID: Brenda [name], female   DOB: 1945/11/27, 68 y.o.   MRN: [id number] Brenda [name] [id number] [birth year] 12/26/2013      Progress Note-Follow Up  Subjective  Chief Complaint  Chief Complaint  Patient presents with  . Follow-up    4 month  . Injections    flu    HPI  Patient is a 68 year old femlae in today for routine medical care. In today for routine follow up. Is noting worsening dyspnea. She has struggled for years but it is worsening. No recent illness or increase congestion. Was a smoker for many years but quit roughly 15 years ago. Denies CP/palp/HA/congestion/fevers/GI or GU c/o. Taking meds as prescribed  Past Medical History  Diagnosis Date  . History of chicken pox     childhood age 8  . Depression     counseling  . GERD (gastroesophageal reflux disease)   . Seasonal allergies   . Hypertension   . Hyperlipidemia     2011  . Other and unspecified hyperlipidemia 04/26/2012  . Other and unspecified hyperlipidemia 08/24/2012  . Urinary incontinence 08/24/2012  . Esophageal reflux 08/24/2012  . Obesity 10/17/2010  . Diabetes mellitus type 2 in obese 01/31/2013    Past Surgical History  Procedure Laterality Date  . Total abdominal hysterectomy      2002  . Bladder repair      2002    Family History  Problem Relation Age of Onset  . Colon cancer Sister 44  . Breast cancer Sister 26  . Heart disease Father 21  . Hypertension Father   . Dementia Mother   . Cancer Paternal Grandfather     bone    History   Social History  . Marital Status: Divorced    Spouse Name: N/A    Number of Children: N/A  . Years of Education: N/A   Occupational History  . Not on file.   Social History Main Topics  . Smoking status: Former Research scientist (life sciences)  . Smokeless tobacco: Never Used     Comment: Qquit 10 years ago  . Alcohol Use: No  . Drug Use: No  . Sexual Activity: Not on file   Other Topics Concern  . Not on file   Social History Narrative  . No narrative  on file    Current Outpatient Prescriptions on File Prior to Visit  Medication Sig Dispense Refill  . cyclobenzaprine (FLEXERIL) 5 MG tablet TAKE ONE TABLET BY MOUTH AT BEDTIME AS NEEDED FOR MUSCLE SPASM  30 tablet  0  . escitalopram (LEXAPRO) 20 MG tablet Take 1 tablet (20 mg total) by mouth daily.  30 tablet  4  . hydrochlorothiazide (HYDRODIURIL) 25 MG tablet TAKE ONE TABLET BY MOUTH ONCE DAILY  30 tablet  0  . losartan (COZAAR) 50 MG tablet TAKE ONE TABLET BY MOUTH ONCE DAILY  30 tablet  2  . pravastatin (PRAVACHOL) 20 MG tablet TAKE ONE TABLET BY MOUTH ONCE DAILY  30 tablet  3  . ranitidine (ZANTAC) 300 MG capsule Take 1 capsule (300 mg total) by mouth at bedtime as needed for heartburn.  30 capsule  5   No current facility-administered medications on file prior to visit.    Allergies  Allergen Reactions  . Mobic [Meloxicam] Nausea Only    Review of Systems  Review of Systems  Constitutional: Negative for fever and malaise/fatigue.  HENT: Negative for congestion.   Eyes: Negative for discharge.  Respiratory: Positive for shortness of breath.  Cardiovascular: Negative for chest pain, palpitations and leg swelling.  Gastrointestinal: Negative for nausea, abdominal pain and diarrhea.  Genitourinary: Negative for dysuria.  Musculoskeletal: Negative for falls.  Skin: Negative for rash.  Neurological: Negative for loss of consciousness and headaches.  Endo/Heme/Allergies: Negative for polydipsia.  Psychiatric/Behavioral: Negative for depression and suicidal ideas. The patient is not nervous/anxious and does not have insomnia.     Objective  BP 125/72  Pulse 83  Temp(Src) 97.7 F (36.5 C) (Oral)  Ht 5\' 8"  (1.727 m)  Wt 211 lb 6.4 oz (95.89 kg)  BMI 32.15 kg/m2  SpO2 95%  Physical Exam  Physical Exam  Constitutional: She is oriented to person, place, and time and well-developed, well-nourished, and in no distress. No distress.  HENT:  Head: Normocephalic and  atraumatic.  Eyes: Conjunctivae are normal.  Neck: Neck supple. No thyromegaly present.  Cardiovascular: Normal rate, regular rhythm and normal heart sounds.   No murmur heard. Pulmonary/Chest: Effort normal and breath sounds normal. She has no wheezes.  Abdominal: She exhibits no distension and no mass.  Musculoskeletal: She exhibits no edema.  Lymphadenopathy:    She has no cervical adenopathy.  Neurological: She is alert and oriented to person, place, and time.  Skin: Skin is warm and dry. No rash noted. She is not diaphoretic.  Psychiatric: Memory, affect and judgment normal.    Lab Results  Component Value Date   TSH 0.38 12/20/2013   Lab Results  Component Value Date   WBC 4.4 12/20/2013   HGB 14.4 12/20/2013   HCT 43.3 12/20/2013   MCV 88.5 12/20/2013   PLT 342.0 12/20/2013   Lab Results  Component Value Date   CREATININE 0.7 12/20/2013   BUN 20 12/20/2013   NA 138 12/20/2013   K 3.8 12/20/2013   CL 103 12/20/2013   CO2 26 12/20/2013   Lab Results  Component Value Date   ALT 14 12/20/2013   AST 16 12/20/2013   ALKPHOS 81 12/20/2013   BILITOT 0.5 12/20/2013   Lab Results  Component Value Date   CHOL 164 12/20/2013   Lab Results  Component Value Date   HDL 41.20 12/20/2013   Lab Results  Component Value Date   LDLCALC 100* 12/20/2013   Lab Results  Component Value Date   TRIG 114.0 12/20/2013   Lab Results  Component Value Date   CHOLHDL 4 12/20/2013     Assessment & Plan  Hypertension Well controlled, no changes to meds. Encouraged heart healthy diet such as the DASH diet and exercise as tolerated.   Esophageal reflux Avoid offending foods, start probiotics. Do not eat large meals in late evening and consider raising head of bed.   Obesity Encouraged DASH diet, decrease po intake and increase exercise as tolerated. Needs 7-8 hours of sleep nightly. Avoid trans fats, eat small, frequent meals every 4-5 hours with lean proteins, complex  carbs and healthy fats. Minimize simple carbs, GMO foods.  Hyperlipidemia Tolerating statin, encouraged heart healthy diet, avoid trans fats, minimize simple carbs and saturated fats. Increase exercise as tolerated  Diabetes mellitus type 2 in obese A1C is now up to 6.6 hgba1c acceptable, minimize simple carbs. Increase exercise as tolerated.   Dyspnea Likely related to deconditioning and obesity. Encouraged activity as tolerated, will proceed with Echo and does have a history of smoking 1/2 ppd since her teens. Quit roughly 1 years ago. Will refer to pulmonology for consideration due to her concerns regarding her dyspnea.

## 2013-12-26 NOTE — Assessment & Plan Note (Signed)
Avoid offending foods, start probiotics. Do not eat large meals in late evening and consider raising head of bed.  

## 2013-12-26 NOTE — Assessment & Plan Note (Signed)
A1C is now up to 6.6 hgba1c acceptable, minimize simple carbs. Increase exercise as tolerated.

## 2013-12-26 NOTE — Assessment & Plan Note (Signed)
Likely related to deconditioning and obesity. Encouraged activity as tolerated, will proceed with Echo and does have a history of smoking 1/2 ppd since her teens. Quit roughly 1 years ago. Will refer to pulmonology for consideration due to her concerns regarding her dyspnea.

## 2013-12-29 ENCOUNTER — Ambulatory Visit (HOSPITAL_BASED_OUTPATIENT_CLINIC_OR_DEPARTMENT_OTHER)
Admission: RE | Admit: 2013-12-29 | Discharge: 2013-12-29 | Disposition: A | Discharge status: Home / Self Care | Payer: Medicare Other | Source: Ambulatory Visit | Attending: Family Medicine | Admitting: Family Medicine

## 2013-12-29 ENCOUNTER — Other Ambulatory Visit: Payer: Self-pay | Admitting: Family Medicine

## 2013-12-29 DIAGNOSIS — R0602 Shortness of breath: Secondary | ICD-10-CM | POA: Insufficient documentation

## 2013-12-29 DIAGNOSIS — E785 Hyperlipidemia, unspecified: Secondary | ICD-10-CM | POA: Diagnosis not present

## 2013-12-29 DIAGNOSIS — I517 Cardiomegaly: Secondary | ICD-10-CM

## 2013-12-29 DIAGNOSIS — I10 Essential (primary) hypertension: Secondary | ICD-10-CM | POA: Diagnosis not present

## 2013-12-29 DIAGNOSIS — R0600 Dyspnea, unspecified: Secondary | ICD-10-CM

## 2013-12-29 DIAGNOSIS — Z87891 Personal history of nicotine dependence: Secondary | ICD-10-CM

## 2013-12-29 DIAGNOSIS — E669 Obesity, unspecified: Secondary | ICD-10-CM | POA: Diagnosis not present

## 2013-12-29 DIAGNOSIS — I369 Nonrheumatic tricuspid valve disorder, unspecified: Secondary | ICD-10-CM

## 2013-12-29 DIAGNOSIS — E119 Type 2 diabetes mellitus without complications: Secondary | ICD-10-CM | POA: Diagnosis not present

## 2013-12-31 ENCOUNTER — Other Ambulatory Visit: Payer: Self-pay | Admitting: Family Medicine

## 2014-01-03 ENCOUNTER — Ambulatory Visit (INDEPENDENT_AMBULATORY_CARE_PROVIDER_SITE_OTHER): Payer: Medicare Other | Admitting: Internal Medicine

## 2014-01-03 ENCOUNTER — Ambulatory Visit (INDEPENDENT_AMBULATORY_CARE_PROVIDER_SITE_OTHER)
Admission: RE | Admit: 2014-01-03 | Discharge: 2014-01-03 | Disposition: A | Discharge status: Home / Self Care | Payer: Medicare Other | Source: Ambulatory Visit | Attending: Internal Medicine | Admitting: Internal Medicine

## 2014-01-03 ENCOUNTER — Encounter: Payer: Self-pay | Admitting: Internal Medicine

## 2014-01-03 DIAGNOSIS — R0600 Dyspnea, unspecified: Secondary | ICD-10-CM

## 2014-01-03 NOTE — Patient Instructions (Addendum)
Dr Frederik Pear office will be arranging for you to see cardiology  In meantime I recommend 1)  Please remember to go to the lab and xray   department downstairs for your tests - we will call you with the results when they are available. 2) Weight control is simply a matter of calorie balance which needs to be tilted in your favor by eating less and exercising more.  To get the most out of exercise, you need to be continuously aware that you are short of breath, but never out of breath, for 30 minutes daily. As you improve, it will actually be easier for you to do the same amount of exercise  in  30 minutes so always push to the level where you are short of breath.  If this does not result in gradual weight reduction then I strongly recommend you see a nutritionist with a food diary x 2 weeks so that we can work out a negative calorie balance which is universally effective in steady weight loss programs.  Think of your calorie balance like you do your bank account where in this case you want the balance to go down so you must take in less calories than you burn up.  It's just that simple:  Hard to do, but easy to understand.  Good luck!   If not satisfied after you do the above, call Libby at [phone] to arrange for CPST (fancy exercise test) Add:  Did not go to labs, request that she return to complete w/u

## 2014-01-03 NOTE — Assessment & Plan Note (Signed)
-   01/03/2014  Walked RA  @ mod pace x 2 laps @ 185 ft each stopped due to fatigue> sob, no desat  Most likely she has mild diastolic dysfunction assoc with wt gain / deconditioning that occurred p her mother's death in summer of 2015   rec complete the planned cards w/u and work on wt loss.  Pulmonary f/u is prn.

## 2014-01-03 NOTE — Progress Notes (Signed)
Subjective:     Patient ID: Brenda [name], female   DOB: 07-22-1945   MRN: [id number]  HPI   54 yowbf quit smoking around 2000 was able to do curves and plenty of walking as recently as midsummer 2015 at wt 204 then lost mom July 12th 2015 then when tried again noted much less ex tolerance to point where leans on the cart at [place] so Charlett Blake referred to pulmonary clinic 01/03/2014 p cardiac ehco showed Grade II diastolic dysfunction.   01/03/2014 1st [facility] office visit/ Rogers Ditter   Chief Complaint  Patient presents with  . New Evaluation    Patient has sob with exhertion. Patient denies cough, chest tightness and wheezing.l   takes zantac 300 mg prn with occ hb but does not seem to correlate with symptoms which are purely exertional and reproducible   No obvious day to day or daytime variabilty or assoc chronic cough or cp or chest tightness, subjective wheeze overt sinus or hb symptoms. No unusual exp hx or h/o childhood pna/ asthma or knowledge of premature birth.  Sleeping ok without nocturnal  or early am exacerbation  of respiratory  c/o's or need for noct saba. Also denies any obvious fluctuation of symptoms with weather or environmental changes or other aggravating or alleviating factors except as outlined above   Current Medications, Allergies, Complete Past Medical History, Past Surgical History, Family History, and Social History were reviewed in [employer] record.  ROS  The following are not active complaints unless bolded sore throat, dysphagia, dental problems, itching, sneezing,  nasal congestion or excess/ purulent secretions, ear ache,   fever, chills, sweats, unintended wt loss, pleuritic or exertional cp, hemoptysis,  orthopnea pnd or leg swelling, presyncope, palpitations, heartburn, abdominal pain, anorexia, nausea, vomiting, diarrhea  or change in bowel or urinary habits, change in stools or urine, dysuria,hematuria,  rash, arthralgias, visual  complaints, headache, numbness weakness or ataxia or problems with walking or coordination,  change in mood/affect or memory.       Review of Systems     Objective:   Physical Exam  amb bf nad  Wt Readings from Last 3 Encounters:  01/03/14 214 lb 14.4 oz (97.478 kg)  12/20/13 211 lb 6.4 oz (95.89 kg)  08/27/13 207 lb (93.895 kg)      HEENT: nl dentition, turbinates, and orophanx. Nl external ear canals without cough reflex   NECK :  without JVD/Nodes/TM/ nl carotid upstrokes bilaterally   LUNGS: no acc muscle use, clear to A and P bilaterally without cough on insp or exp maneuvers   CV:  RRR  no s3 or murmur or increase in P2, no edema   ABD:  soft and nontender with nl excursion in the supine position. No bruits or organomegaly, bowel sounds nl  MS:  warm without deformities, calf tenderness, cyanosis or clubbing  SKIN: warm and dry without lesions    NEURO:  alert, approp, no deficits    CXR  01/03/2014 : 1. Mild basilar atelectasis and/or scarring. 2. Borderline cardiomegaly. No CHF   Lab Results  Component Value Date   WBC 4.4 12/20/2013   HGB 14.4 12/20/2013   HCT 43.3 12/20/2013   MCV 88.5 12/20/2013   PLT 342.0 12/20/2013       Chemistry      Component Value Date/Time   NA 138 12/20/2013 0951   K 3.8 12/20/2013 0951   CL 103 12/20/2013 0951   CO2 26 12/20/2013 0951  BUN 20 12/20/2013 0951   CREATININE 0.7 12/20/2013 0951   CREATININE 0.74 08/20/2013 1101      Component Value Date/Time   CALCIUM 9.1 12/20/2013 0951   ALKPHOS 81 12/20/2013 0951   AST 16 12/20/2013 0951   ALT 14 12/20/2013 0951   BILITOT 0.5 12/20/2013 0951       Lab Results  Component Value Date   TSH 0.38 12/20/2013    Did not go to lab as requested for d dimer and bnp, will call her       Assessment:

## 2014-01-04 ENCOUNTER — Telehealth: Payer: Self-pay | Admitting: *Deleted

## 2014-01-04 NOTE — Telephone Encounter (Signed)
-----   Message from Tanda Rockers, MD sent at 01/04/2014  8:16 AM EST ----- Did not go to labs, request that she return to complete w/u

## 2014-01-04 NOTE — Telephone Encounter (Signed)
ATC, NA and no option to leave msg-home number  Called mobile number- [email or web address]

## 2014-01-06 ENCOUNTER — Other Ambulatory Visit (INDEPENDENT_AMBULATORY_CARE_PROVIDER_SITE_OTHER): Payer: Medicare Other

## 2014-01-06 DIAGNOSIS — R0600 Dyspnea, unspecified: Secondary | ICD-10-CM

## 2014-01-06 LAB — BRAIN NATRIURETIC PEPTIDE: PRO B NATRI PEPTIDE: 7 pg/mL (ref 0.0–100.0)

## 2014-01-06 NOTE — Telephone Encounter (Signed)
Patient requesting xray results.  [phone]

## 2014-01-06 NOTE — Telephone Encounter (Signed)
LM x 1  Notes Recorded by Tanda Rockers, MD on 01/03/2014 at 5:06 PM Call pt: Reviewed cxr and no acute change so no change in recommendations made at [facility] from Tanda Rockers, MD sent at 01/04/2014 8:16 AM EST -----  Did not go to labs, request that she return to complete w/u

## 2014-01-06 NOTE — Telephone Encounter (Signed)
I spoke with patient about results and she verbalized understanding and had no questions. Pt came and had lab work today. Please advise MW thanks

## 2014-01-07 LAB — D-DIMER, QUANTITATIVE: D-Dimer, Quant: 0.27 ug/mL-FEU (ref 0.00–0.48)

## 2014-01-07 NOTE — Progress Notes (Signed)
Quick Note:  Spoke with pt and notified of results per Dr. Wert. Pt verbalized understanding and denied any questions.  ______ 

## 2014-01-07 NOTE — Telephone Encounter (Signed)
Labs completed

## 2014-01-13 ENCOUNTER — Telehealth: Payer: Self-pay | Admitting: Cardiovascular Disease

## 2014-01-14 NOTE — Telephone Encounter (Signed)
Closed encounter °

## 2014-02-02 ENCOUNTER — Institutional Professional Consult (permissible substitution): Payer: Medicare Other | Admitting: Cardiovascular Disease

## 2014-02-04 ENCOUNTER — Institutional Professional Consult (permissible substitution): Payer: Medicare Other | Admitting: Cardiovascular Disease

## 2014-02-08 ENCOUNTER — Institutional Professional Consult (permissible substitution): Payer: Medicare Other | Admitting: Cardiovascular Disease

## 2014-02-10 ENCOUNTER — Ambulatory Visit (INDEPENDENT_AMBULATORY_CARE_PROVIDER_SITE_OTHER): Payer: Medicare Other | Admitting: Cardiovascular Disease

## 2014-02-10 ENCOUNTER — Other Ambulatory Visit: Payer: Self-pay | Admitting: Family Medicine

## 2014-02-10 VITALS — BP 138/84 | HR 91 | Ht 68.0 in | Wt 214.3 lb

## 2014-02-10 DIAGNOSIS — E1169 Type 2 diabetes mellitus with other specified complication: Secondary | ICD-10-CM

## 2014-02-10 DIAGNOSIS — E785 Hyperlipidemia, unspecified: Secondary | ICD-10-CM

## 2014-02-10 DIAGNOSIS — R0600 Dyspnea, unspecified: Secondary | ICD-10-CM

## 2014-02-10 DIAGNOSIS — E669 Obesity, unspecified: Secondary | ICD-10-CM

## 2014-02-10 DIAGNOSIS — I10 Essential (primary) hypertension: Secondary | ICD-10-CM

## 2014-02-10 DIAGNOSIS — G4733 Obstructive sleep apnea (adult) (pediatric): Secondary | ICD-10-CM

## 2014-02-10 DIAGNOSIS — E119 Type 2 diabetes mellitus without complications: Secondary | ICD-10-CM

## 2014-02-10 DIAGNOSIS — G4719 Other hypersomnia: Secondary | ICD-10-CM

## 2014-02-10 DIAGNOSIS — R0683 Snoring: Secondary | ICD-10-CM

## 2014-02-10 DIAGNOSIS — K219 Gastro-esophageal reflux disease without esophagitis: Secondary | ICD-10-CM

## 2014-02-10 MED ORDER — LOSARTAN POTASSIUM 50 MG PO TABS: 50.0000 mg | ORAL_TABLET | Freq: Every day | ORAL | Status: DC

## 2014-02-10 MED ORDER — LOSARTAN POTASSIUM 50 MG PO TABS: 50.0000 mg | ORAL_TABLET | Freq: Two times a day (BID) | ORAL | Status: DC

## 2014-02-10 NOTE — Patient Instructions (Addendum)
Your physician has requested that you have a lexiscan myoview. For further information please visit [email or web address]. Please follow instruction sheet, as given.  Your physician has recommended that you have a sleep study. This test records several body functions during sleep, including: brain activity, eye movement, oxygen and carbon dioxide blood levels, heart rate and rhythm, breathing rate and rhythm, the flow of air through your mouth and nose, snoring, body muscle movements, and chest and belly movement. This will be done at [facility].  Your physician has recommended you make the following change in your medication: increase the losartan 50 mg to 1 tablet twice a day. A NEW PRESCRIPTION HAS BEEN SENT TO YOUR [facility] TO REFLECT THE MEDICATION CHANGE.  Your physician recommends that you schedule a follow-up appointment in: February - March 2016

## 2014-02-14 ENCOUNTER — Encounter: Payer: Self-pay | Admitting: Cardiovascular Disease

## 2014-02-14 DIAGNOSIS — G4733 Obstructive sleep apnea (adult) (pediatric): Secondary | ICD-10-CM

## 2014-02-14 HISTORY — DX: Obstructive sleep apnea (adult) (pediatric): G47.33

## 2014-02-14 NOTE — Progress Notes (Signed)
Patient ID: Brenda [name], female   DOB: 08/09/1945, 68 y.o.   MRN: [id number]     PATIENT PROFILE: Brenda [name] is a 68 y.o. female who is referred through the courtesy of Dr. Maryellen Pile for evaluation of shortness of breath as well sleepiness.   HPI:  Brenda [name] is a 68 y.o. female who has a history of hypertension, hyperlipidemia, GERD, as well as anxiety.  She has been maintained on losartan 50 mg daily, HCTZ 25 mg for her blood pressure.  She takes Zantac 300 milligrams on an as-needed basis for GERD.  She has been on pravastatin 20 mg daily for hyperlipidemia.  With reference to her anxiety.  She has been treated with Lexapro 20 mg.  Recently, she has noticed that she has developed shortness of breath.  She has remote history of tobacco but quit a approximaltely 10 years ago.  She has been felt to have borderline diabetes and is not on therapy with recent hemoglobin A1c increased at 6.6.  Because of recent symptomatology, she presents for cardiology evaluation.  On 12/29/2013, she underwent a 2-D echo Doppler study which was done at [facility].  This showed an ejection fraction of 55-60% and there was evidence for moderate concentric left ventricular hypertrophy.  There was evidence for grade 1 diastolic dysfunction.  There was mild tricuspid regurgitation and mild hypertension with estimated PA pressure 35 mm.  She admits to significant recent weight gain, particularly since she retired at age 28.  When she was 68 years old, she states that she weighed 145 pounds and now weighs 214 pounds.  Past Medical History  Diagnosis Date  . History of chicken pox     childhood age 50  . Depression     counseling  . GERD (gastroesophageal reflux disease)   . Seasonal allergies   . Hypertension   . Hyperlipidemia     2011  . Other and unspecified hyperlipidemia 04/26/2012  . Other and unspecified hyperlipidemia 08/24/2012  . Urinary incontinence 08/24/2012  . Esophageal reflux 08/24/2012    . Obesity 10/17/2010  . Diabetes mellitus type 2 in obese 01/31/2013    Past Surgical History  Procedure Laterality Date  . Total abdominal hysterectomy      2002  . Bladder repair      2002    Allergies  Allergen Reactions  . Mobic [Meloxicam] Nausea Only    Current Outpatient Prescriptions  Medication Sig Dispense Refill  . aspirin 81 MG tablet Take 81 mg by mouth daily.    . cyclobenzaprine (FLEXERIL) 5 MG tablet TAKE ONE TABLET BY MOUTH ONCE DAILY AT BEDTIME AS NEEDED FOR MUSCLE SPASMS 30 tablet 0  . escitalopram (LEXAPRO) 20 MG tablet Take 1 tablet (20 mg total) by mouth daily. 30 tablet 4  . hydrochlorothiazide (HYDRODIURIL) 25 MG tablet TAKE ONE TABLET BY MOUTH ONCE DAILY 30 tablet 6  . losartan (COZAAR) 50 MG tablet Take 1 tablet (50 mg total) by mouth 2 (two) times daily. 60 tablet 6  . pravastatin (PRAVACHOL) 20 MG tablet TAKE ONE TABLET BY MOUTH ONCE DAILY 30 tablet 6  . ranitidine (ZANTAC) 300 MG capsule Take 1 capsule (300 mg total) by mouth at bedtime as needed for heartburn. 30 capsule 5   No current facility-administered medications for this visit.    Social history is notable that she retired at age 62.  She is divorced for 10 years.  She has one child one grandchild.  She previously  had worked for [employer].  She completed 12th grade of education.  She quit smoking over 10 years ago, but had smoked for approximaltely 40 years.  She does drink occasional wine.  She walks 3 days per week.  Family History  Problem Relation Age of Onset  . Colon cancer Sister 77  . Breast cancer Sister 64  . Heart disease Father 61  . Hypertension Father   . Dementia Mother   . Cancer Paternal Grandfather     bone    ROS General: Negative; No fevers, chills, or night sweats; positive for 60 pound weight gain in 6 years. HEENT: Negative; No changes in vision or hearing, sinus congestion, difficulty swallowing Pulmonary: Negative; No cough, wheezing, shortness of  breath, hemoptysis Cardiovascular:  See HPI; No chest pain, presyncope, syncope, palpitations, edema GI: Negative; No nausea, vomiting, diarrhea, or abdominal pain GU: Negative; No dysuria, hematuria, or difficulty voiding Musculoskeletal: Negative; no myalgias, joint pain, or weakness Hematologic/Oncologic: Negative; no easy bruising, bleeding Endocrine: Negative; no heat/cold intolerance; no diabetes Neuro: Negative; no changes in balance, headaches Skin: Negative; No rashes or skin lesions Psychiatric: Negative; No behavioral problems, depression Sleep: Positive for daytime sleepiness.  Positive for snoring daytime sleepiness, no bruxism, restless legs, hypnogagnic hallucinations Other comprehensive 14 point system review is negative   Physical Exam BP 138/84 mmHg  Pulse 91  Ht 5\' 8"  (1.727 m)  Wt 214 lb 4.8 oz (97.206 kg)  BMI 32.59 kg/m2 General: Alert, oriented, no distress.  Skin: normal turgor, no rashes, warm and dry HEENT: Normocephalic, atraumatic. Pupils equal round and reactive to light; sclera anicteric; extraocular muscles intact; Fundi no hemorrhages or exudates.  Disks flat. Nose without nasal septal hypertrophy Mouth/Parynx benign; Mallinpatti scale 3 Neck: No JVD, no carotid bruits; normal carotid upstroke Lungs: clear to ausculatation and percussion; no wheezing or rales Chest wall: without tenderness to palpitation Heart: PMI not displaced, RRR, s1 s2 normal, 1/6 systolic murmur, no diastolic murmur, no rubs, gallops, thrills, or heaves Abdomen: soft, nontender; no hepatosplenomehaly, BS+; abdominal aorta nontender and not dilated by palpation. Back: no CVA tenderness Pulses 2+ Musculoskeletal: full range of motion, normal strength, no joint deformities Extremities: no clubbing cyanosis or edema, Homan's sign negative  Neurologic: grossly nonfocal; Cranial nerves grossly wnl Psychologic: Normal mood and affect   ECG (independently read by me): Normal sinus  rhythm with left axis deviation.  Poor anterior R-wave progression.  Low QRS voltage.  LABS:  BMET    Component Value Date/Time   NA 138 12/20/2013 0951   K 3.8 12/20/2013 0951   CL 103 12/20/2013 0951   CO2 26 12/20/2013 0951   GLUCOSE 103* 12/20/2013 0951   BUN 20 12/20/2013 0951   CREATININE 0.7 12/20/2013 0951   CREATININE 0.74 08/20/2013 1101   CALCIUM 9.1 12/20/2013 0951     Hepatic Function Panel     Component Value Date/Time   PROT 7.9 12/20/2013 0951   ALBUMIN 3.6 12/20/2013 0951   ALBUMIN 3.6 12/20/2013 0951   AST 16 12/20/2013 0951   ALT 14 12/20/2013 0951   ALKPHOS 81 12/20/2013 0951   BILITOT 0.5 12/20/2013 0951   BILIDIR 0.0 12/20/2013 0951   IBILI 0.4 08/20/2013 1101     CBC    Component Value Date/Time   WBC 4.4 12/20/2013 0951   RBC 4.89 12/20/2013 0951   HGB 14.4 12/20/2013 0951   HCT 43.3 12/20/2013 0951   PLT 342.0 12/20/2013 0951   MCV 88.5 12/20/2013 0951  MCH 29.3 08/20/2013 1101   MCHC 33.1 12/20/2013 0951   RDW 15.1 12/20/2013 0951   LYMPHSABS 1.7 11/25/2011 1015   MONOABS 0.5 11/25/2011 1015   EOSABS 0.2 11/25/2011 1015   BASOSABS 0.1 11/25/2011 1015     BNP    Component Value Date/Time   PROBNP 7.0 01/06/2014 1326    Lipid Panel     Component Value Date/Time   CHOL 164 12/20/2013 0951   TRIG 114.0 12/20/2013 0951   HDL 41.20 12/20/2013 0951   CHOLHDL 4 12/20/2013 0951   VLDL 22.8 12/20/2013 0951   LDLCALC 100* 12/20/2013 0951      RADIOLOGY: No results found.   ASSESSMENT AND PLAN: [name] is a 68 year old female who is mildly obese with a body mass index of 32.59 kg/m.  She has gained 60 pounds over the past 6 years since she has retired at age 45.  She has a history of hypertension.  She has borderline diabetes mellitus.  Her blood pressure today was 138/84.  Her echo Doppler study has demonstrated moderate left ventricular hypertrophy and grade 1 diastolic dysfunction.  I suspect some of her dyspnea  is related to her diastolic dysfunction.  I have recommended further titration of her leg losartan to 50 mg twice a day.  She also has had incidences with significant snoring and daytime sleepiness, and with her weight gain.  I have also recommended evaluation for obstructive sleep apnea.  I'm scheduling her for a diagnostic polysomnogram.  I have encouraged weight loss.  With her cardiac risk factor profile consisting of hypertension, hyperlipidemia, and prior 40 year tobacco history I am scheduling her for Lexiscan Myoview to make certain her dyspnea is not related to ischemia.  I will see her back in the office for follow-up evaluation and further recommendations will be made at that time.  Time spent: 45 minutes   Troy Sine, MD, [facility] 02/14/2014 6:26 PM

## 2014-02-15 ENCOUNTER — Telehealth (HOSPITAL_COMMUNITY): Payer: Self-pay

## 2014-02-15 NOTE — Telephone Encounter (Signed)
Encounter complete. 

## 2014-02-17 ENCOUNTER — Ambulatory Visit (HOSPITAL_COMMUNITY)
Admission: RE | Admit: 2014-02-17 | Discharge: 2014-02-17 | Disposition: A | Discharge status: Home / Self Care | Payer: Medicare Other | Source: Ambulatory Visit | Attending: Cardiovascular Disease | Admitting: Cardiovascular Disease

## 2014-02-17 DIAGNOSIS — R0600 Dyspnea, unspecified: Secondary | ICD-10-CM | POA: Insufficient documentation

## 2014-02-17 DIAGNOSIS — I10 Essential (primary) hypertension: Secondary | ICD-10-CM | POA: Insufficient documentation

## 2014-02-17 MED ORDER — TECHNETIUM TC 99M SESTAMIBI GENERIC - CARDIOLITE
29.1000 | Freq: Once | INTRAVENOUS | Status: AC | PRN
  Administered 2014-02-17: 29.1 via INTRAVENOUS

## 2014-02-17 MED ORDER — REGADENOSON 0.4 MG/5ML IV SOLN
0.4000 mg | Freq: Once | INTRAVENOUS | Status: AC
  Administered 2014-02-17: 0.4 mg via INTRAVENOUS

## 2014-02-17 MED ORDER — TECHNETIUM TC 99M SESTAMIBI GENERIC - CARDIOLITE
11.0000 | Freq: Once | INTRAVENOUS | Status: AC | PRN
  Administered 2014-02-17: 11 via INTRAVENOUS

## 2014-02-17 NOTE — Procedures (Addendum)
[facility] [address] [place] [address] [phone]  Cardiology Nuclear Med Study  Brenda [name] is a 68 y.o. female     MRN : [id number]     DOB: [birth year]  Procedure Date: 02/17/2014  Nuclear Med Background Indication for Stress Test:  Evaluation for Ischemia and Enlarged heart;GIDD;EF=55-60% per 12/19/2013 ECHO. History:  No further cardiac or respiratory history reported;No prior NUC MPI for comparison. Cardiac Risk Factors: Family History - CAD, Lipids and Obesity  Symptoms:  DOE, Fatigue and SOB   Nuclear Pre-Procedure Caffeine/Decaff Intake:  12:00am NPO After: 10am   IV Site: R Forearm  IV 0.9% NS with Angio Cath:  22g  Chest Size (in):  n/a  IV Started by: Rolene Course, RN  Height: 5\' 8"  (1.727 m)  Cup Size: D  BMI:  Body mass index is 32.55 kg/(m^2). Weight:  214 lb (97.07 kg)   Tech Comments:  n/a    Nuclear Med Study 1 or 2 day study: 1 day  Stress Test Type:  [place] Provider:  Shelva Majestic, MD   Resting Radionuclide: Technetium 10m Sestamibi  Resting Radionuclide Dose: 11.0 mCi   Stress Radionuclide:  Technetium 26m Sestamibi  Stress Radionuclide Dose: 29.1 mCi           Stress Protocol Rest HR: 64 Stress HR: 81  Rest BP: 140/76 Stress BP: 144/73  Exercise Time (min): n/a METS: n/a     Dose of Adenosine (mg):  n/a Dose of Lexiscan: 0.4 mg  Dose of Atropine (mg): n/a Dose of Dobutamine: n/a mcg/kg/min (at max HR)  Stress Test Technologist: Mellody Memos, CCT Nuclear Technologist: Imagene Riches, CNMT   Rest Procedure:  Myocardial perfusion imaging was performed at rest 45 minutes following the intravenous administration of Technetium 16m Sestamibi. Stress Procedure:  The patient received IV Lexiscan 0.4 mg over 15-seconds.  Technetium 43m Sestamibi injected IV at 30-seconds.  There were no significant changes with Lexiscan.  Quantitative spect images were obtained after  a 45 minute delay.  Transient Ischemic Dilatation (Normal <1.22):  1.23  QGS EDV:  58 ml QGS ESV:  20 ml LV Ejection Fraction: 65%    Rest ECG: NSR, low voltage, PRWP  Stress ECG: No significant change from baseline ECG  QPS Raw Data Images:  Normal; no motion artifact; normal heart/lung ratio. Stress Images: Mild apical thinning, otherwise normal homogeneous uptake in all areas of the myocardium. Rest Images:  Comparison with the stress images reveals no significant change. Subtraction (SDS):  No evidence of ischemia. LV Wall Motion:  NL LV Function; NL Wall Motion  Impression Exercise Capacity:  Lexiscan with no exercise. BP Response:  Normal blood pressure response. Clinical Symptoms:  No significant symptoms noted. ECG Impression:  No significant ECG changes with Lexiscan. Comparison with Prior Nuclear Study: No previous nuclear study performed   Overall Impression:  Low risk stress nuclear study with mild "apical thinning" artifact.   Sanda Klein, MD  02/17/2014 5:10 PM

## 2014-04-05 ENCOUNTER — Other Ambulatory Visit: Payer: Self-pay | Admitting: Family Medicine

## 2014-04-06 NOTE — Telephone Encounter (Signed)
Last filled:  02/10/14 Amt: 30, 0 refills Last OV:  12/20/13  Please advise.

## 2014-04-14 ENCOUNTER — Other Ambulatory Visit: Payer: Self-pay | Admitting: Family Medicine

## 2014-04-27 ENCOUNTER — Other Ambulatory Visit: Payer: Self-pay | Admitting: Family Medicine

## 2014-05-01 ENCOUNTER — Ambulatory Visit (HOSPITAL_BASED_OUTPATIENT_CLINIC_OR_DEPARTMENT_OTHER): Payer: Medicare Other | Attending: Cardiovascular Disease | Admitting: *Deleted

## 2014-05-01 VITALS — Ht 68.0 in | Wt 214.0 lb

## 2014-05-01 DIAGNOSIS — G4719 Other hypersomnia: Secondary | ICD-10-CM | POA: Insufficient documentation

## 2014-05-01 DIAGNOSIS — R0683 Snoring: Secondary | ICD-10-CM

## 2014-05-01 DIAGNOSIS — R0602 Shortness of breath: Secondary | ICD-10-CM | POA: Diagnosis not present

## 2014-05-04 NOTE — Addendum Note (Signed)
Addended by: Shelva Majestic A on: 05/04/2014 06:26 PM   Modules accepted: Level of Service

## 2014-05-04 NOTE — Sleep Study (Signed)
NAME: Brenda Brenda [name] DATE OF BIRTH:  24-Jun-1945 MEDICAL RECORD NUMBER [id number]  LOCATION: [facility]  PHYSICIAN: Bless Belshe Brenda [name]  DATE OF STUDY: 05/01/2014  SLEEP STUDY TYPE: Nocturnal Polysomnogram               REFERRING PHYSICIAN: Troy Sine, MD  INDICATION FOR STUDY:  Brenda Brenda [name] is Brenda [name] 69 year old African-American female who is referred for Brenda [name] sleep study for the evaluation of sleep apnea, nonrestorative sleep, and excessive daytime fatigue.  EPWORTH SLEEPINESS SCORE:  4, which is normal and argues against excessive daytime sleepiness. HEIGHT: 5\' 8"  (172.7 cm)  WEIGHT: 214 lb (97.07 kg)    Body mass index is 32.55 kg/(m^2).  NECK SIZE: 14.5 in.  MEDICATIONS:  ranitidine (ZANTAC) 300 MG capsule 300 mg, At bedtime PRN pravastatin (PRAVACHOL) 20 MG tablet losartan (COZAAR) 50 MG tablet 50 mg, 2 times daily hydrochlorothiazide (HYDRODIURIL) 25 MG tablet escitalopram (LEXAPRO) 20 MG tablet cyclobenzaprine (FLEXERIL) 5 MG tablet cyclobenzaprine (FLEXERIL) 5 MG tablet aspirin 81 MG tablet 81 mg, Daily   SLEEP ARCHITECTURE:  The patient slept for Brenda [name] total sleep time of 252.5 minutes out of Brenda [name] sleep period of time of 353.5 minutes.  Sleep efficiency was reduced at 67.2%.  Sleep onset was normal at 19 minutes.  Latency to REM sleep was markedly prolonged at 307 minutes.  The patient spent 68 minutes (26.9% in stage I, 145.5 minutes (57.6%).  In stage II, 0 minutes in stage III, and 39 minutes (15.4%) in rem sleep.  Only 3 minutes of supine sleep was achieved, which was non-REM.  There were Brenda [name] total of 103 arousals with an index of 24.5.  RESPIRATORY DATA:  The patient had 11 obstructive apneas during the entire sleep period and had 0 central, 0 mixed, and 0, hypopnea spells.  The apnea hypopnea index (AHI) was 2.6 per hour and Brenda [name] respiratory disturbance index (RDI was 8.8 per hour.  All obstructive apneic events occurred with REM sleep in the nonsupine position.  The   AHI during REM sleep was moderately increased at 16.9 per hour.  This places the patient in the category of probable increased upper airway resistance syndrome (UARS) without definitive sleep apnea overall, but moderate sleep apnea during REM sleep.  There was moderate snoring.  OXYGEN DATA:  The baseline oxygen saturation was 89%.  The oxygen nadir with non-REM sleep was 82% and with REM sleep was 80%.  CARDIAC DATA:  There was normal sinus rhythm with an average heart rate at 77 bpm.  There was Brenda [name] rare PAC.  MOVEMENT/PARASOMNIA:  There was significantly increased periodic limb movements at 291.  The index was significantly elevated at 69.1 with 5.5 per hour to arousal.  IMPRESSION/ RECOMMENDATION:   Increased upper airway resistance syndrome UARS) overall, but with moderate sleep apnea during REM sleep. Reduced baseline oxygen saturation with oxygen desaturation to Brenda [name] nadir of 80% with REM sleep. Reduced sleep efficiency. Moderate snoring. Significant periodic limb movement disorder of sleep with an index of 69.1 with 5.5 per hour to arousal. The arousal index was abnormal.  Efforts should be made to optimize nasal and oral pharyngeal patency. At present, the patient does not meet criteria for CPAP therapy, but does have moderate sleep apnea with REM sleep and CPAP therapy will be beneficial. Consider initial alternatives for CPAP therapy such as customized dental appliance for the treatment of snoring. Recommend overnight oximetry and supplemental oxygen therapy with her baseline oxygen saturation and significant  additional nocturnal oxygen desaturation. If patient is symptomatic with restless legs consider Brenda [name] trial or requip with Brenda [name] significantly elevated  PLMS index of 69.1    Brenda Brenda [name] Diplomate, [employer] of Sleep Medicine  ELECTRONICALLY SIGNED ON:  05/04/2014, 5:56 PM [place] PH: [phone]   FX: [phone] [place]

## 2014-05-05 ENCOUNTER — Other Ambulatory Visit: Payer: Self-pay | Admitting: *Deleted

## 2014-05-05 DIAGNOSIS — G4734 Idiopathic sleep related nonobstructive alveolar hypoventilation: Secondary | ICD-10-CM

## 2014-05-16 ENCOUNTER — Encounter: Payer: Self-pay | Admitting: Cardiovascular Disease

## 2014-05-23 ENCOUNTER — Ambulatory Visit (INDEPENDENT_AMBULATORY_CARE_PROVIDER_SITE_OTHER): Payer: Medicare Other | Admitting: Cardiovascular Disease

## 2014-05-23 VITALS — BP 142/96 | HR 84 | Ht 68.0 in | Wt 210.5 lb

## 2014-05-23 DIAGNOSIS — I10 Essential (primary) hypertension: Secondary | ICD-10-CM | POA: Diagnosis not present

## 2014-05-23 DIAGNOSIS — R0902 Hypoxemia: Secondary | ICD-10-CM | POA: Diagnosis not present

## 2014-05-23 DIAGNOSIS — E785 Hyperlipidemia, unspecified: Secondary | ICD-10-CM | POA: Diagnosis not present

## 2014-05-23 DIAGNOSIS — G4734 Idiopathic sleep related nonobstructive alveolar hypoventilation: Secondary | ICD-10-CM

## 2014-05-23 DIAGNOSIS — E669 Obesity, unspecified: Secondary | ICD-10-CM

## 2014-05-23 NOTE — Patient Instructions (Signed)
You have been referred to [facility] to get  Oxygen for night time use.  Your physician wants you to follow-up in: 6 months or sooner if needed.  You will receive a reminder letter in the mail two months in advance. If you don't receive a letter, please call our office to schedule the follow-up appointment.

## 2014-05-24 ENCOUNTER — Encounter: Payer: Self-pay | Admitting: Cardiovascular Disease

## 2014-05-24 DIAGNOSIS — G4734 Idiopathic sleep related nonobstructive alveolar hypoventilation: Secondary | ICD-10-CM | POA: Insufficient documentation

## 2014-05-24 HISTORY — DX: Idiopathic sleep related nonobstructive alveolar hypoventilation: G47.34

## 2014-05-24 NOTE — Progress Notes (Signed)
Patient ID: Brenda [name], female   DOB: 1945-08-31, 69 y.o.   MRN: [id number]       HPI:  Brenda [name] is a 69 y.o. female who I saw for initial cardiology evaluation in December 2015 through the courtesy of Dr. Maryellen Pile for evaluation of shortness of breath as well as sleepiness.  She presents for follow-up evaluation.   Brenda [name]  has a history of hypertension, hyperlipidemia, GERD, as well as anxiety.  She has been maintained on losartan 50 mg daily, HCTZ 25 mg for her blood pressure.  She takes Zantac 300 milligrams on an as-needed basis for GERD.  She has been on pravastatin 20 mg daily for hyperlipidemia.  With reference to her anxiety.  She has been treated with Lexapro 20 mg.  Recently, she had noticed mild shortness of breath.  She has remote history of tobacco but quit a approximaltely 10 years ago.  She has been felt to have borderline diabetes and is not on therapy with recent hemoglobin A1c increased at 6.6.    On 12/29/2013, she underwent a 2-D echo Doppler study at [facility] which showed an ejection fraction of 55-60% and moderate concentric left ventricular hypertrophy.  There was evidence for grade 1 diastolic dysfunction.  There was mild tricuspid regurgitation and mild hypertension with estimated PA pressure 35 mm.  Since she retired at age 69.  She admits to significant weight gain of at least 60 pounds.    She underwent a nuclear perfusion study on 02/17/2014 which was low risk and showed mild "apical thinning "artifact.  She did not develop any ECG changes.  Because of her significant history of sleepiness.  She underwent a diagnostic polysomnogram.  Her apnea hypotony index was 2.6 per hour and respiratory disturbance index was 8.8 per hour.  However, with REM sleep.  There was moderate sleep apnea with an AHI of 16.9 per hour.  Overall, it was felt that she had increased upper airway resistance syndrome but had moderate sleep apnea with REM sleep.  She had reduced  baseline oxygen saturation and her oxygen desaturated to a nadir of 80% with REM sleep.  There was evidence for moderate snoring.  She has significant periodic limb movement disorder of sleep with an index of 6.1 with 5.5 per hour to arousal.  She did not meet criteria for CPAP therapy, however,  because of her oxygen desaturation .  I referred her for overnight oximetry which was completed last week.  This was notable in that her mean oxygen saturation was 87.63%.  The highest saturation was 95% and the lowest was 73%.  She spent 6 hours and 47 minutes with saturation below 90% and 5 minutes and 8 seconds with saturation below 80%.  She presents now for evaluation.   Past Medical History  Diagnosis Date  . History of chicken pox     childhood age 64  . Depression     counseling  . GERD (gastroesophageal reflux disease)   . Seasonal allergies   . Hypertension   . Hyperlipidemia     2011  . Other and unspecified hyperlipidemia 04/26/2012  . Other and unspecified hyperlipidemia 08/24/2012  . Urinary incontinence 08/24/2012  . Esophageal reflux 08/24/2012  . Obesity 10/17/2010  . Diabetes mellitus type 2 in obese 01/31/2013    Past Surgical History  Procedure Laterality Date  . Total abdominal hysterectomy      2002  . Bladder repair      2002  Allergies  Allergen Reactions  . Mobic [Meloxicam] Nausea Only    Current Outpatient Prescriptions  Medication Sig Dispense Refill  . aspirin 81 MG tablet Take 81 mg by mouth daily.    . cyclobenzaprine (FLEXERIL) 5 MG tablet TAKE ONE TABLET BY MOUTH AT BEDTIME AS NEEDED FOR MUSCLE SPASM 30 tablet 0  . cyclobenzaprine (FLEXERIL) 5 MG tablet TAKE ONE TABLET BY MOUTH AT BEDTIME AS NEEDED FOR MUSCLE SPASM 30 tablet 1  . escitalopram (LEXAPRO) 20 MG tablet TAKE ONE TABLET BY MOUTH ONCE DAILY 30 tablet 2  . hydrochlorothiazide (HYDRODIURIL) 25 MG tablet TAKE ONE TABLET BY MOUTH ONCE DAILY 30 tablet 6  . losartan (COZAAR) 50 MG tablet Take 1  tablet (50 mg total) by mouth 2 (two) times daily. 60 tablet 6  . pravastatin (PRAVACHOL) 20 MG tablet TAKE ONE TABLET BY MOUTH ONCE DAILY 30 tablet 6  . Probiotic Product (PROBIOTIC DAILY) CAPS Take 1 capsule by mouth daily.    . ranitidine (ZANTAC) 300 MG capsule Take 1 capsule (300 mg total) by mouth at bedtime as needed for heartburn. 30 capsule 5  . TURMERIC PO Take 1 capsule by mouth daily.     No current facility-administered medications for this visit.    Social history is notable that she retired at age 29.  She is divorced for 10 years.  She has one child one grandchild.  She previously had worked for [employer].  She completed 12th grade of education.  She quit smoking over 10 years ago, but had smoked for approximaltely 40 years.  She does drink occasional wine.  She walks 3 days per week.  Family History  Problem Relation Age of Onset  . Colon cancer Sister 51  . Breast cancer Sister 55  . Heart disease Father 81  . Hypertension Father   . Dementia Mother   . Cancer Paternal Grandfather     bone    ROS General: Negative; No fevers, chills, or night sweats; positive for 60 pound weight gain in 6 years. HEENT: Negative; No changes in vision or hearing, sinus congestion, difficulty swallowing Pulmonary: Negative; No cough, wheezing, shortness of breath, hemoptysis Cardiovascular:  See HPI; No chest pain, presyncope, syncope, palpitations, edema GI: Negative; No nausea, vomiting, diarrhea, or abdominal pain GU: Negative; No dysuria, hematuria, or difficulty voiding Musculoskeletal: Negative; no myalgias, joint pain, or weakness Hematologic/Oncologic: Negative; no easy bruising, bleeding Endocrine: Negative; no heat/cold intolerance; no diabetes Neuro: Negative; no changes in balance, headaches Skin: Negative; No rashes or skin lesions Psychiatric: Negative; No behavioral problems, depression Sleep: Positive for daytime sleepiness.  Positive for snoring, no bruxism,  restless legs, hypnogagnic hallucinations Other comprehensive 14 point system review is negative   Physical Exam BP 142/96 mmHg  Pulse 84  Ht 5\' 8"  (1.727 m)  Wt 210 lb 8 oz (95.482 kg)  BMI 32.01 kg/m2 General: Alert, oriented, no distress.  Skin: normal turgor, no rashes, warm and dry HEENT: Normocephalic, atraumatic. Pupils equal round and reactive to light; sclera anicteric; extraocular muscles intact; Fundi no hemorrhages or exudates.  Disks flat. Nose without nasal septal hypertrophy Mouth/Parynx benign; Mallinpatti scale 3 Neck: No JVD, no carotid bruits; normal carotid upstroke Lungs: clear to ausculatation and percussion; no wheezing or rales Chest wall: without tenderness to palpitation Heart: PMI not displaced, RRR, s1 s2 normal, 1/6 systolic murmur, no diastolic murmur, no rubs, gallops, thrills, or heaves Abdomen: soft, nontender; no hepatosplenomehaly, BS+; abdominal aorta nontender and not dilated by palpation.  Back: no CVA tenderness Pulses 2+ Musculoskeletal: full range of motion, normal strength, no joint deformities Extremities: no clubbing cyanosis or edema, Homan's sign negative  Neurologic: grossly nonfocal; Cranial nerves grossly wnl Psychologic: Normal mood and affect   ECG (independently read by me): Normal sinus rhythm at 84 bpm.  Left axis deviation.  Reduced voltage.  ECG (independently read by me): Normal sinus rhythm with left axis deviation.  Poor anterior R-wave progression.  Low QRS voltage.  LABS:  BMET  BMP Latest Ref Rng 12/20/2013 08/20/2013 04/19/2013  Glucose 70 - 99 mg/dL 103(H) 98 104(H)  BUN 6 - 23 mg/dL 20 20 19   Creatinine 0.4 - 1.2 mg/dL 0.7 0.74 0.70  Sodium 135 - 145 mEq/L 138 139 137  Potassium 3.5 - 5.1 mEq/L 3.8 4.1 4.7  Chloride 96 - 112 mEq/L 103 103 102  CO2 19 - 32 mEq/L 26 26 28   Calcium 8.4 - 10.5 mg/dL 9.1 9.2 9.2     Hepatic Function Panel     Component Value Date/Time   PROT 7.9 12/20/2013 0951   ALBUMIN 3.6  12/20/2013 0951   ALBUMIN 3.6 12/20/2013 0951   AST 16 12/20/2013 0951   ALT 14 12/20/2013 0951   ALKPHOS 81 12/20/2013 0951   BILITOT 0.5 12/20/2013 0951   BILIDIR 0.0 12/20/2013 0951   IBILI 0.4 08/20/2013 1101     CBC  CBC Latest Ref Rng 12/20/2013 08/20/2013 04/19/2013  WBC 4.0 - 10.5 K/uL 4.4 4.1 4.4  Hemoglobin 12.0 - 15.0 g/dL 14.4 14.2 14.5  Hematocrit 36.0 - 46.0 % 43.3 41.4 42.6  Platelets 150.0 - 400.0 K/uL 342.0 316 399     BNP    Component Value Date/Time   PROBNP 7.0 01/06/2014 1326    Lipid Panel     Component Value Date/Time   CHOL 164 12/20/2013 0951   TRIG 114.0 12/20/2013 0951   HDL 41.20 12/20/2013 0951   CHOLHDL 4 12/20/2013 0951   VLDL 22.8 12/20/2013 0951   LDLCALC 100* 12/20/2013 0951      RADIOLOGY: No results found.   ASSESSMENT AND PLAN: Ms. Kauri Garson is a 69 year old female who is mildly obese with a body mass index of 32.59 kg/m.  She has gained 60 pounds over the past 6 years since she has retired at age 89.  She has a history of hypertension.  She has borderline diabetes mellitus. Her echo Doppler study has demonstrated moderate left ventricular hypertrophy and grade 1 diastolic dysfunction.  I suspect some of her dyspnea is related to her diastolic dysfunction.  In I last saw her, I further titrated her losartan to 50 mg twice a day.  She also is on hydrochlorothiazide 25 mg for blood pressure control.  Her nuclear study argues against significant ischemia in etiology of her dyspnea.  I reviewed her sleep study.  Presently, she does not meet criteria for CPAP therapy, with an AHI overall of 2.6 per hour; however, she does have moderate sleep apnea during periods of REM sleep.  She has significant oxygen desaturation.  Presently, I am recommending nocturnal oxygen supplementation at 2 L/m.  We discussed the importance of weight loss, particularly with her excessive weight gain over the past several years.  She had increased periodic limb  movement disorder with sleep.  However, she denies any painful restless legs.  She may very well ultimately evolved into needing CPAP therapy.  I will see her in 4-6 months for follow-up evaluation or sooner if problem arise.  Time spent: 25 minutes   Troy Sine, MD, [facility] 05/24/2014 6:16 PM

## 2014-05-25 ENCOUNTER — Telehealth: Payer: Self-pay | Admitting: Cardiovascular Disease

## 2014-05-26 NOTE — Telephone Encounter (Signed)
Spoke with [name] with [facility] about patient's insurance not covering her oxygen. Told her that there is no other diagnosis other than desaturation. She will inform patient that she does not qualify for medicare to cover the oxygen that she is needing. They plan to offer her a payment plan option.

## 2014-06-01 ENCOUNTER — Telehealth: Payer: Self-pay | Admitting: *Deleted

## 2014-06-01 NOTE — Telephone Encounter (Signed)
Received a staff message from [facility]. She has made multiple attempts to contact patient to discuss options for her to receive the oxygen ordered by Dr. Claiborne Billings. The patient's insurance will not cover the oxygen because the patient does not have a upper respiratory diagnosis. [facility] will wait to see if the patient returns a call.  Dr. Claiborne Billings will be notified of the status of the O2 order.

## 2014-06-05 NOTE — Telephone Encounter (Signed)
Consider obesity-hypoventilation with significant oxygen desaturation

## 2014-06-06 DIAGNOSIS — Z8639 Personal history of other endocrine, nutritional and metabolic disease: Secondary | ICD-10-CM | POA: Diagnosis not present

## 2014-06-06 DIAGNOSIS — H04123 Dry eye syndrome of bilateral lacrimal glands: Secondary | ICD-10-CM | POA: Diagnosis not present

## 2014-06-06 DIAGNOSIS — H18412 Arcus senilis, left eye: Secondary | ICD-10-CM | POA: Diagnosis not present

## 2014-06-06 DIAGNOSIS — H2513 Age-related nuclear cataract, bilateral: Secondary | ICD-10-CM | POA: Diagnosis not present

## 2014-06-06 DIAGNOSIS — H11442 Conjunctival cysts, left eye: Secondary | ICD-10-CM | POA: Diagnosis not present

## 2014-06-06 NOTE — Telephone Encounter (Signed)
This diagnosis will be relayed to [facility] see if Medicare will cover.

## 2014-06-10 ENCOUNTER — Telehealth: Payer: Self-pay | Admitting: Cardiovascular Disease

## 2014-06-10 NOTE — Telephone Encounter (Signed)
Pt says she still have not received her sleeping device.

## 2014-06-10 NOTE — Telephone Encounter (Signed)
Patient was wanting to know when she will be able to received her sleeping device - per note, appears to be nasal cannula. Informed patient it appears as if [name] & Dr. Claiborne Billings have been trying to sort thru this with [name] and it is not being covered with Medicare. Patient was provided with [name] # [phone] and told to ask for [name] to see what the out of pocket expense would be.  Patient reports she is losing weight and is using bike, treadmill, water exercises.

## 2014-06-10 NOTE — Telephone Encounter (Signed)
Patient called [facility] and home O2 will be too costly. Patient will continue to work on weight loss and exercise. Situation will be reassesed when she follows up with Dr. Claiborne Billings in about 6 months.

## 2014-07-14 DIAGNOSIS — H11442 Conjunctival cysts, left eye: Secondary | ICD-10-CM | POA: Diagnosis not present

## 2014-07-14 DIAGNOSIS — H18412 Arcus senilis, left eye: Secondary | ICD-10-CM | POA: Diagnosis not present

## 2014-07-14 DIAGNOSIS — H04123 Dry eye syndrome of bilateral lacrimal glands: Secondary | ICD-10-CM | POA: Diagnosis not present

## 2014-07-17 ENCOUNTER — Other Ambulatory Visit: Payer: Self-pay | Admitting: Family Medicine

## 2014-07-24 ENCOUNTER — Other Ambulatory Visit: Payer: Self-pay | Admitting: Family Medicine

## 2014-07-24 NOTE — Telephone Encounter (Signed)
i have approved another month of her meds. But her labs were off enough in October that I recommend she come in for follow up on her bp and labs and appt.

## 2014-07-25 MED ORDER — LOSARTAN POTASSIUM 50 MG PO TABS: 50.0000 mg | ORAL_TABLET | Freq: Two times a day (BID) | ORAL | Status: DC

## 2014-07-25 MED ORDER — PRAVASTATIN SODIUM 20 MG PO TABS: 20.0000 mg | ORAL_TABLET | Freq: Every day | ORAL | Status: DC

## 2014-07-25 MED ORDER — HYDROCHLOROTHIAZIDE 25 MG PO TABS: 25.0000 mg | ORAL_TABLET | Freq: Every day | ORAL | Status: DC

## 2014-07-25 NOTE — Addendum Note (Signed)
Addended by: Sharon Seller B on: 07/25/2014 07:10 AM   Modules accepted: Orders

## 2014-07-27 ENCOUNTER — Other Ambulatory Visit: Payer: Self-pay | Admitting: Family Medicine

## 2014-07-27 NOTE — Telephone Encounter (Signed)
I have refilled but she will be at a year by October and she does not have a further appt. Make sure she knows she needs an appt by Oct to get more refills

## 2014-08-19 ENCOUNTER — Telehealth: Payer: Self-pay | Admitting: Cardiovascular Disease

## 2014-08-19 NOTE — Telephone Encounter (Signed)
Closed encounter °

## 2014-08-25 ENCOUNTER — Other Ambulatory Visit: Payer: Self-pay | Admitting: Family Medicine

## 2014-08-26 ENCOUNTER — Other Ambulatory Visit: Payer: Self-pay | Admitting: Family Medicine

## 2014-09-01 DIAGNOSIS — H527 Unspecified disorder of refraction: Secondary | ICD-10-CM | POA: Diagnosis not present

## 2014-09-26 ENCOUNTER — Other Ambulatory Visit: Payer: Self-pay | Admitting: Family Medicine

## 2014-09-27 ENCOUNTER — Other Ambulatory Visit: Payer: Self-pay | Admitting: Family Medicine

## 2014-09-27 NOTE — Telephone Encounter (Signed)
Pt last OV and labs: 12/20/13 Pt was told to f/u in 4 months for Medicare Wellness.  Please advise.

## 2014-11-11 ENCOUNTER — Other Ambulatory Visit: Payer: Self-pay | Admitting: Family Medicine

## 2014-11-28 ENCOUNTER — Ambulatory Visit (INDEPENDENT_AMBULATORY_CARE_PROVIDER_SITE_OTHER): Payer: Medicare Other | Admitting: Cardiovascular Disease

## 2014-11-28 ENCOUNTER — Encounter: Payer: Self-pay | Admitting: Cardiovascular Disease

## 2014-11-28 VITALS — BP 148/88 | HR 78 | Ht 68.0 in | Wt 210.7 lb

## 2014-11-28 DIAGNOSIS — I10 Essential (primary) hypertension: Secondary | ICD-10-CM | POA: Diagnosis not present

## 2014-11-28 DIAGNOSIS — K219 Gastro-esophageal reflux disease without esophagitis: Secondary | ICD-10-CM | POA: Insufficient documentation

## 2014-11-28 DIAGNOSIS — E785 Hyperlipidemia, unspecified: Secondary | ICD-10-CM

## 2014-11-28 DIAGNOSIS — E669 Obesity, unspecified: Secondary | ICD-10-CM | POA: Diagnosis not present

## 2014-11-28 DIAGNOSIS — F419 Anxiety disorder, unspecified: Secondary | ICD-10-CM

## 2014-11-28 DIAGNOSIS — G478 Other sleep disorders: Secondary | ICD-10-CM | POA: Diagnosis not present

## 2014-11-28 NOTE — Patient Instructions (Signed)
Your physician wants you to follow-up in: 1 year or sooner if needed. You will receive a reminder letter in the mail two months in advance. If you don't receive a letter, please call our office to schedule the follow-up appointment.  

## 2014-11-28 NOTE — Progress Notes (Signed)
Patient ID: Brenda [name], female   DOB: 12/16/1945, 69 y.o.   MRN: [id number]     HPI:  Brenda [name] is a 69 y.o. female who I saw for initial cardiology evaluation in December 2015 through the courtesy of Dr. Maryellen Pile for evaluation of shortness of breath as well as sleepiness.  She presents for a 6 month follow-up evaluation.   Brenda [name]  has a history of hypertension, hyperlipidemia, GERD, and anxiety.  She has been maintained on losartan 50 mg daily, HCTZ 25 mg for her blood pressure.  She takes Zantac 300 milligrams on an as-needed basis for GERD.  She has been on pravastatin 20 mg daily for hyperlipidemia.  With reference to her anxiety she has been treated with Lexapro 20 mg.  She has remote history of tobacco but quit a approximaltely 10 years ago.  She has been felt to have borderline diabetes and is not on therapy with recent hemoglobin A1c increased at 6.6.    On 12/29/2013, she underwent a 2-D echo Doppler study at [facility] [place] which showed an ejection fraction of 55-60% and moderate concentric left ventricular hypertrophy.  There was evidence for grade 1 diastolic dysfunction.  There was mild tricuspid regurgitation and mild hypertension with estimated PA pressure 35 mm.  Since she retired at age 74.  She admits to significant weight gain of at least 60 pounds.    She underwent a nuclear perfusion study on 02/17/2014 which was low risk and showed mild "apical thinning "artifact.  She did not develop any ECG changes.  Because of her significant history of sleepiness she underwent a diagnostic polysomnogram.  Her AHI was 2.6 per hour and respiratory disturbance index was 8.8 per hour.  However, with REM sleep there was moderate sleep apnea with an AHI of 16.9 per hour.  Overall, it was felt that she had increased upper airway resistance syndrome (UARS) but had moderate sleep apnea with REM sleep.  She had reduced baseline oxygen saturation and her oxygen desaturated to a nadir of 80%  with REM sleep.  There was evidence for moderate snoring.  She has significant periodic limb movement disorder of sleep with an index of 6.1 with 5.5 per hour to arousal.  She did not meet criteria for CPAP therapy, however,  because of her oxygen desaturation .  I referred her for overnight oximetry: her mean oxygen saturation was 87.63%.  The highest saturation was 95% and the lowest was 73%.  She spent 6 hours and 47 minutes with saturation below 90% and 5 minutes and 8 seconds with saturation below 80%.  I had recommended nocturnal oxygen use, but apparently she has not done this.  Brenda [name], she feels that she is sleeping well.  She denies chest pain.  She has been trying to lose weight.  She denies chest pain.  She denies palpitations.  She denies significant leg swelling.  Her GERD has been controlled.  She sees her primary physician, Dr. Charlett Blake at six-month intervals.  She presents for evaluation.   Past Medical History  Diagnosis Date  . History of chicken pox     childhood age 76  . Depression     counseling  . GERD (gastroesophageal reflux disease)   . Seasonal allergies   . Hypertension   . Hyperlipidemia     2011  . Other and unspecified hyperlipidemia 04/26/2012  . Other and unspecified hyperlipidemia 08/24/2012  . Urinary incontinence 08/24/2012  . Esophageal reflux 08/24/2012  .  Obesity 10/17/2010  . Diabetes mellitus type 2 in obese 01/31/2013    Past Surgical History  Procedure Laterality Date  . Total abdominal hysterectomy      2002  . Bladder repair      2002    Allergies  Allergen Reactions  . Mobic [Meloxicam] Nausea Only    Current Outpatient Prescriptions  Medication Sig Dispense Refill  . acetaminophen (TYLENOL ARTHRITIS PAIN) 650 MG CR tablet Take 1,300 mg by mouth daily.    Marland Kitchen aspirin 81 MG tablet Take 81 mg by mouth daily.    . cyclobenzaprine (FLEXERIL) 5 MG tablet TAKE ONE TABLET BY MOUTH AT BEDTIME AS NEEDED FOR MUSCLE SPASM 30 tablet 0  .  cyclobenzaprine (FLEXERIL) 5 MG tablet TAKE ONE TABLET BY MOUTH AT BEDTIME AS NEEDED FOR MUSCLE SPASM 30 tablet 1  . escitalopram (LEXAPRO) 20 MG tablet TAKE ONE TABLET BY MOUTH ONCE DAILY 30 tablet 0  . hydrochlorothiazide (HYDRODIURIL) 25 MG tablet TAKE ONE TABLET BY MOUTH ONCE DAILY 30 tablet 0  . losartan (COZAAR) 50 MG tablet Take 1 tablet (50 mg total) by mouth 2 (two) times daily. 60 tablet 0  . pravastatin (PRAVACHOL) 20 MG tablet Take 1 tablet (20 mg total) by mouth daily. 30 tablet 0  . Probiotic Product (PROBIOTIC DAILY) CAPS Take 1 capsule by mouth daily.    . ranitidine (ZANTAC) 300 MG tablet TAKE 1 TABLET (300 MG) BY MOUTH AT BEDTIME AS NEEDED FOR HEARTBURN. 30 tablet 1  . TURMERIC PO Take 1 capsule by mouth daily.     No current facility-administered medications for this visit.    Social history is notable that she retired at age 59.  She is divorced for 10 years.  She has one child one grandchild.  She previously had worked for [employer].  She completed 12th grade of education.  She quit smoking over 10 years ago, but had smoked for approximaltely 40 years.  She does drink occasional wine.  She walks 3 days per week.  Family History  Problem Relation Age of Onset  . Colon cancer Sister 64  . Breast cancer Sister 59  . Heart disease Father 80  . Hypertension Father   . Dementia Mother   . Cancer Paternal Grandfather     bone    ROS General: Negative; No fevers, chills, or night sweats; positive for 60 pound weight gain in 6 years. HEENT: Negative; No changes in vision or hearing, sinus congestion, difficulty swallowing Pulmonary: Negative; No cough, wheezing, shortness of breath, hemoptysis Cardiovascular:  See HPI; No chest pain, presyncope, syncope, palpitations, edema GI: Negative; No nausea, vomiting, diarrhea, or abdominal pain GU: Negative; No dysuria, hematuria, or difficulty voiding Musculoskeletal: Negative; no myalgias, joint pain, or  weakness Hematologic/Oncologic: Negative; no easy bruising, bleeding Endocrine: Negative; no heat/cold intolerance; no diabetes Neuro: Negative; no changes in balance, headaches Skin: Negative; No rashes or skin lesions Psychiatric: Positive for anxiety Sleep: Positive for daytime sleepiness.  Positive for snoring, no bruxism, restless legs, hypnogagnic hallucinations Other comprehensive 14 point system review is negative   Physical Exam BP 148/88 mmHg  Pulse 78  Ht 5' 8"  (1.727 m)  Wt 210 lb 11.2 oz (95.573 kg)  BMI 32.04 kg/m2  Wt Readings from Last 3 Encounters:  11/28/14 210 lb 11.2 oz (95.573 kg)  05/23/14 210 lb 8 oz (95.482 kg)  05/01/14 214 lb (97.07 kg)   General: Alert, oriented, no distress.  Skin: normal turgor, no rashes, warm and  dry HEENT: Normocephalic, atraumatic. Pupils equal round and reactive to light; sclera anicteric; extraocular muscles intact; Fundi no hemorrhages or exudates.  Disks flat. Nose without nasal septal hypertrophy Mouth/Parynx benign; Mallinpatti scale 3 Neck: No JVD, no carotid bruits; normal carotid upstroke Lungs: clear to ausculatation and percussion; no wheezing or rales Chest wall: without tenderness to palpitation Heart: PMI not displaced, RRR, s1 s2 normal, 1/6 systolic murmur, no diastolic murmur, no rubs, gallops, thrills, or heaves Abdomen: soft, nontender; no hepatosplenomehaly, BS+; abdominal aorta nontender and not dilated by palpation. Back: no CVA tenderness Pulses 2+ Musculoskeletal: full range of motion, normal strength, no joint deformities Extremities: no clubbing cyanosis or edema, Homan's sign negative  Neurologic: grossly nonfocal; Cranial nerves grossly wnl Psychologic: Normal mood and affect  ECG (independently read by me): Normal sinus rhythm at 78 bpm.  No ectopy.  05/23/2014 ECG (independently read by me): Normal sinus rhythm at 84 bpm.  Left axis deviation.  Reduced voltage.  ECG (independently read by me):  Normal sinus rhythm with left axis deviation.  Poor anterior R-wave progression.  Low QRS voltage.  LABS:  BMP Latest Ref Rng 12/20/2013 08/20/2013 04/19/2013  Glucose 70 - 99 mg/dL 103(H) 98 104(H)  BUN 6 - 23 mg/dL 20 20 19   Creatinine 0.4 - 1.2 mg/dL 0.7 0.74 0.70  Sodium 135 - 145 mEq/L 138 139 137  Potassium 3.5 - 5.1 mEq/L 3.8 4.1 4.7  Chloride 96 - 112 mEq/L 103 103 102  CO2 19 - 32 mEq/L 26 26 28   Calcium 8.4 - 10.5 mg/dL 9.1 9.2 9.2   Hepatic Function Latest Ref Rng 12/20/2013 12/20/2013 08/20/2013  Total Protein 6.0 - 8.3 g/dL 7.9 - 6.9  Albumin 3.5 - 5.2 g/dL 3.6 3.6 4.1  AST 0 - 37 U/L 16 - 16  ALT 0 - 35 U/L 14 - 11  Alk Phosphatase 39 - 117 U/L 81 - 63  Total Bilirubin 0.2 - 1.2 mg/dL 0.5 - 0.5  Bilirubin, Direct 0.0 - 0.3 mg/dL 0.0 - 0.1    CBC Latest Ref Rng 12/20/2013 08/20/2013 04/19/2013  WBC 4.0 - 10.5 K/uL 4.4 4.1 4.4  Hemoglobin 12.0 - 15.0 g/dL 14.4 14.2 14.5  Hematocrit 36.0 - 46.0 % 43.3 41.4 42.6  Platelets 150.0 - 400.0 K/uL 342.0 316 399   Lab Results  Component Value Date   MCV 88.5 12/20/2013   MCV 85.4 08/20/2013   MCV 86.8 04/19/2013   Lab Results  Component Value Date   TSH 0.38 12/20/2013   Lab Results  Component Value Date   HGBA1C 6.6* 12/20/2013   Lipid Panel     Component Value Date/Time   CHOL 164 12/20/2013 0951   TRIG 114.0 12/20/2013 0951   HDL 41.20 12/20/2013 0951   CHOLHDL 4 12/20/2013 0951   VLDL 22.8 12/20/2013 0951   LDLCALC 100* 12/20/2013 0951    BNP    Component Value Date/Time   PROBNP 7.0 01/06/2014 1326   RADIOLOGY: No results found.   ASSESSMENT AND PLAN: Brenda [name] is a 69 year old occasion female who has a history of mild obesity and had  gained 60 pounds over the past 7 years since she has retired at age 65.  She has a history of hypertension and borderline diabetes mellitus. Her echo Doppler study has demonstrated moderate left ventricular hypertrophy and grade 1 diastolic dysfunction.  I  suspect some of her dyspnea is related to her diastolic dysfunction.  Her blood pressure today initially was elevated to 148/88,  but on repeat by me was 130/76 , and she is tolerating losartan now at 50 mg twice a day, HCTZ 25 mg.  She is on aspirin previously.  Platelet therapy.  She's not having GERD symptoms on her ranitidine 300 mg daily.  She also is on pravastatin 20 mg for hyperlipidemia.  She continues to be on Lexapro for anxiety.  We discussed continued weight loss with a target at least less than 200 pounds.  She feels she is sleeping well.  Not certain why she she never had follow-up supplemental oxygen.  We discussed her weight with reference to obstructive sleep apnea and the potential that her UARS may progress to overt overall sleep apnea.  Since she presently is stable and she sees Dr. Charlett Blake at six-month intervals, I will see her one year for cardiology reevaluation, but am available sooner if any problems develop.   Time spent: 25 minutes Troy Sine, MD, [facility] 11/28/2014 5:49 PM

## 2014-11-29 ENCOUNTER — Other Ambulatory Visit: Payer: Self-pay | Admitting: Family Medicine

## 2014-11-30 ENCOUNTER — Other Ambulatory Visit: Payer: Self-pay | Admitting: Family Medicine

## 2014-12-02 DIAGNOSIS — Z1231 Encounter for screening mammogram for malignant neoplasm of breast: Secondary | ICD-10-CM | POA: Diagnosis not present

## 2014-12-02 LAB — HM MAMMOGRAPHY

## 2014-12-12 ENCOUNTER — Encounter: Payer: Self-pay | Admitting: Family Medicine

## 2014-12-19 ENCOUNTER — Ambulatory Visit (INDEPENDENT_AMBULATORY_CARE_PROVIDER_SITE_OTHER): Payer: Medicare Other | Admitting: Family Medicine

## 2014-12-19 ENCOUNTER — Encounter: Payer: Self-pay | Admitting: Family Medicine

## 2014-12-19 VITALS — BP 120/80 | HR 90 | Temp 98.1°F | Ht 68.0 in | Wt 210.2 lb

## 2014-12-19 DIAGNOSIS — E119 Type 2 diabetes mellitus without complications: Secondary | ICD-10-CM | POA: Diagnosis not present

## 2014-12-19 DIAGNOSIS — Z0000 Encounter for general adult medical examination without abnormal findings: Secondary | ICD-10-CM | POA: Diagnosis not present

## 2014-12-19 DIAGNOSIS — M5116 Intervertebral disc disorders with radiculopathy, lumbar region: Secondary | ICD-10-CM

## 2014-12-19 DIAGNOSIS — E669 Obesity, unspecified: Secondary | ICD-10-CM

## 2014-12-19 DIAGNOSIS — Z23 Encounter for immunization: Secondary | ICD-10-CM | POA: Diagnosis not present

## 2014-12-19 DIAGNOSIS — K219 Gastro-esophageal reflux disease without esophagitis: Secondary | ICD-10-CM

## 2014-12-19 DIAGNOSIS — M62838 Other muscle spasm: Secondary | ICD-10-CM | POA: Diagnosis not present

## 2014-12-19 DIAGNOSIS — I10 Essential (primary) hypertension: Secondary | ICD-10-CM | POA: Diagnosis not present

## 2014-12-19 DIAGNOSIS — Z780 Asymptomatic menopausal state: Secondary | ICD-10-CM

## 2014-12-19 DIAGNOSIS — E785 Hyperlipidemia, unspecified: Secondary | ICD-10-CM | POA: Diagnosis not present

## 2014-12-19 DIAGNOSIS — E1169 Type 2 diabetes mellitus with other specified complication: Secondary | ICD-10-CM

## 2014-12-19 LAB — URINALYSIS
Bilirubin Urine: NEGATIVE
HGB URINE DIPSTICK: NEGATIVE
Ketones, ur: NEGATIVE
Leukocytes, UA: NEGATIVE
NITRITE: NEGATIVE
Specific Gravity, Urine: 1.025 (ref 1.000–1.030)
Total Protein, Urine: NEGATIVE
UROBILINOGEN UA: 0.2 (ref 0.0–1.0)
Urine Glucose: NEGATIVE
pH: 5 (ref 5.0–8.0)

## 2014-12-19 LAB — CBC
HEMATOCRIT: 45.3 % (ref 36.0–46.0)
Hemoglobin: 14.9 g/dL (ref 12.0–15.0)
MCHC: 32.8 g/dL (ref 30.0–36.0)
MCV: 90 fl (ref 78.0–100.0)
Platelets: 348 10*3/uL (ref 150.0–400.0)
RBC: 5.03 Mil/uL (ref 3.87–5.11)
RDW: 14.8 % (ref 11.5–15.5)
WBC: 5.8 10*3/uL (ref 4.0–10.5)

## 2014-12-19 LAB — LIPID PANEL
Cholesterol: 193 mg/dL (ref 0–200)
HDL: 52 mg/dL (ref >39.00)
LDL Cholesterol: 106 mg/dL — ABNORMAL HIGH (ref 0–99)
NonHDL: 141.39
Total CHOL/HDL Ratio: 4
Triglycerides: 177 mg/dL — ABNORMAL HIGH (ref 0.0–149.0)
VLDL: 35.4 mg/dL (ref 0.0–40.0)

## 2014-12-19 LAB — MICROALBUMIN / CREATININE URINE RATIO
Creatinine,U: 127.4 mg/dL
MICROALB UR: 0.9 mg/dL (ref 0.0–1.9)
MICROALB/CREAT RATIO: 0.7 mg/g (ref 0.0–30.0)

## 2014-12-19 LAB — TSH: TSH: 0.93 u[IU]/mL (ref 0.35–4.50)

## 2014-12-19 LAB — HEMOGLOBIN A1C: HEMOGLOBIN A1C: 6.4 % (ref 4.6–6.5)

## 2014-12-19 MED ORDER — CYCLOBENZAPRINE HCL 10 MG PO TABS: 10.0000 mg | ORAL_TABLET | Freq: Every evening | ORAL | Status: DC | PRN

## 2014-12-19 MED ORDER — ZOSTER VACCINE LIVE 19400 UNT/0.65ML ~~LOC~~ SOLR: 0.6500 mL | Freq: Once | SUBCUTANEOUS | Status: DC

## 2014-12-19 NOTE — Patient Instructions (Signed)
Preventive Care for Adults, Female A healthy lifestyle and preventive care can promote health and wellness. Preventive health guidelines for women include the following key practices.  A routine yearly physical is a good way to check with your health care provider about your health and preventive screening. It is a chance to share any concerns and updates on your health and to receive a thorough exam.  Visit your dentist for a routine exam and preventive care every 6 months. Brush your teeth twice a day and floss once a day. Good oral hygiene prevents tooth decay and gum disease.  The frequency of eye exams is based on your age, health, family medical history, use of contact lenses, and other factors. Follow your health care provider's recommendations for frequency of eye exams.  Eat a healthy diet. Foods like vegetables, fruits, whole grains, low-fat dairy products, and lean protein foods contain the nutrients you need without too many calories. Decrease your intake of foods high in solid fats, added sugars, and salt. Eat the right amount of calories for you.Get information about a proper diet from your health care provider, if necessary.  Regular physical exercise is one of the most important things you can do for your health. Most adults should get at least 150 minutes of moderate-intensity exercise (any activity that increases your heart rate and causes you to sweat) each week. In addition, most adults need muscle-strengthening exercises on 2 or more days a week.  Maintain a healthy weight. The body mass index (BMI) is a screening tool to identify possible weight problems. It provides an estimate of body fat based on height and weight. Your health care provider can find your BMI and can help you achieve or maintain a healthy weight.For adults 20 years and older:  A BMI below 18.5 is considered underweight.  A BMI of 18.5 to 24.9 is normal.  A BMI of 25 to 29.9 is considered overweight.  A  BMI of 30 and above is considered obese.  Maintain normal blood lipids and cholesterol levels by exercising and minimizing your intake of saturated fat. Eat a balanced diet with plenty of fruit and vegetables. Blood tests for lipids and cholesterol should begin at age 45 and be repeated every 5 years. If your lipid or cholesterol levels are high, you are over 50, or you are at high risk for heart disease, you may need your cholesterol levels checked more frequently.Ongoing high lipid and cholesterol levels should be treated with medicines if diet and exercise are not working.  If you smoke, find out from your health care provider how to quit. If you do not use tobacco, do not start.  Lung cancer screening is recommended for adults aged 45-80 years who are at high risk for developing lung cancer because of a history of smoking. A yearly low-dose CT scan of the lungs is recommended for people who have at least a 30-pack-year history of smoking and are a current smoker or have quit within the past 15 years. A pack year of smoking is smoking an average of 1 pack of cigarettes a day for 1 year (for example: 1 pack a day for 30 years or 2 packs a day for 15 years). Yearly screening should continue until the smoker has stopped smoking for at least 15 years. Yearly screening should be stopped for people who develop a health problem that would prevent them from having lung cancer treatment.  If you are pregnant, do not drink alcohol. If you are  breastfeeding, be very cautious about drinking alcohol. If you are not pregnant and choose to drink alcohol, do not have more than 1 drink per day. One drink is considered to be 12 ounces (355 mL) of beer, 5 ounces (148 mL) of wine, or 1.5 ounces (44 mL) of liquor.  Avoid use of street drugs. Do not share needles with anyone. Ask for help if you need support or instructions about stopping the use of drugs.  High blood pressure causes heart disease and increases the risk  of stroke. Your blood pressure should be checked at least every 1 to 2 years. Ongoing high blood pressure should be treated with medicines if weight loss and exercise do not work.  If you are 55-79 years old, ask your health care provider if you should take aspirin to prevent strokes.  Diabetes screening is done by taking a blood sample to check your blood glucose level after you have not eaten for a certain period of time (fasting). If you are not overweight and you do not have risk factors for diabetes, you should be screened once every 3 years starting at age 45. If you are overweight or obese and you are 40-70 years of age, you should be screened for diabetes every year as part of your cardiovascular risk assessment.  Breast cancer screening is essential preventive care for women. You should practice "breast self-awareness." This means understanding the normal appearance and feel of your breasts and may include breast self-examination. Any changes detected, no matter how small, should be reported to a health care provider. Women in their 20s and 30s should have a clinical breast exam (CBE) by a health care provider as part of a regular health exam every 1 to 3 years. After age 40, women should have a CBE every year. Starting at age 40, women should consider having a mammogram (breast X-ray test) every year. Women who have a family history of breast cancer should talk to their health care provider about genetic screening. Women at a high risk of breast cancer should talk to their health care providers about having an MRI and a mammogram every year.  Breast cancer gene (BRCA)-related cancer risk assessment is recommended for women who have family members with BRCA-related cancers. BRCA-related cancers include breast, ovarian, tubal, and peritoneal cancers. Having family members with these cancers may be associated with an increased risk for harmful changes (mutations) in the breast cancer genes BRCA1 and  BRCA2. Results of the assessment will determine the need for genetic counseling and BRCA1 and BRCA2 testing.  Your health care provider may recommend that you be screened regularly for cancer of the pelvic organs (ovaries, uterus, and vagina). This screening involves a pelvic examination, including checking for microscopic changes to the surface of your cervix (Pap test). You may be encouraged to have this screening done every 3 years, beginning at age 21.  For women ages 30-65, health care providers may recommend pelvic exams and Pap testing every 3 years, or they may recommend the Pap and pelvic exam, combined with testing for human papilloma virus (HPV), every 5 years. Some types of HPV increase your risk of cervical cancer. Testing for HPV may also be done on women of any age with unclear Pap test results.  Other health care providers may not recommend any screening for nonpregnant women who are considered low risk for pelvic cancer and who do not have symptoms. Ask your health care provider if a screening pelvic exam is right for   you.  If you have had past treatment for cervical cancer or a condition that could lead to cancer, you need Pap tests and screening for cancer for at least 20 years after your treatment. If Pap tests have been discontinued, your risk factors (such as having a new sexual partner) need to be reassessed to determine if screening should resume. Some women have medical problems that increase the chance of getting cervical cancer. In these cases, your health care provider may recommend more frequent screening and Pap tests.  Colorectal cancer can be detected and often prevented. Most routine colorectal cancer screening begins at the age of 50 years and continues through age 75 years. However, your health care provider may recommend screening at an earlier age if you have risk factors for colon cancer. On a yearly basis, your health care provider may provide home test kits to check  for hidden blood in the stool. Use of a small camera at the end of a tube, to directly examine the colon (sigmoidoscopy or colonoscopy), can detect the earliest forms of colorectal cancer. Talk to your health care provider about this at age 50, when routine screening begins. Direct exam of the colon should be repeated every 5-10 years through age 75 years, unless early forms of precancerous polyps or small growths are found.  People who are at an increased risk for hepatitis B should be screened for this virus. You are considered at high risk for hepatitis B if:  You were born in a country where hepatitis B occurs often. Talk with your health care provider about which countries are considered high risk.  Your parents were born in a high-risk country and you have not received a shot to protect against hepatitis B (hepatitis B vaccine).  You have HIV or AIDS.  You use needles to inject street drugs.  You live with, or have sex with, someone who has hepatitis B.  You get hemodialysis treatment.  You take certain medicines for conditions like cancer, organ transplantation, and autoimmune conditions.  Hepatitis C blood testing is recommended for all people born from 1945 through 1965 and any individual with known risks for hepatitis C.  Practice safe sex. Use condoms and avoid high-risk sexual practices to reduce the spread of sexually transmitted infections (STIs). STIs include gonorrhea, chlamydia, syphilis, trichomonas, herpes, HPV, and human immunodeficiency virus (HIV). Herpes, HIV, and HPV are viral illnesses that have no cure. They can result in disability, cancer, and death.  You should be screened for sexually transmitted illnesses (STIs) including gonorrhea and chlamydia if:  You are sexually active and are younger than 24 years.  You are older than 24 years and your health care provider tells you that you are at risk for this type of infection.  Your sexual activity has changed  since you were last screened and you are at an increased risk for chlamydia or gonorrhea. Ask your health care provider if you are at risk.  If you are at risk of being infected with HIV, it is recommended that you take a prescription medicine daily to prevent HIV infection. This is called preexposure prophylaxis (PrEP). You are considered at risk if:  You are sexually active and do not regularly use condoms or know the HIV status of your partner(s).  You take drugs by injection.  You are sexually active with a partner who has HIV.  Talk with your health care provider about whether you are at high risk of being infected with HIV. If   you choose to begin PrEP, you should first be tested for HIV. You should then be tested every 3 months for as long as you are taking PrEP.  Osteoporosis is a disease in which the bones lose minerals and strength with aging. This can result in serious bone fractures or breaks. The risk of osteoporosis can be identified using a bone density scan. Women ages 67 years and over and women at risk for fractures or osteoporosis should discuss screening with their health care providers. Ask your health care provider whether you should take a calcium supplement or vitamin D to reduce the rate of osteoporosis.  Menopause can be associated with physical symptoms and risks. Hormone replacement therapy is available to decrease symptoms and risks. You should talk to your health care provider about whether hormone replacement therapy is right for you.  Use sunscreen. Apply sunscreen liberally and repeatedly throughout the day. You should seek shade when your shadow is shorter than you. Protect yourself by wearing long sleeves, pants, a wide-brimmed hat, and sunglasses year round, whenever you are outdoors.  Once a month, do a whole body skin exam, using a mirror to look at the skin on your back. Tell your health care provider of new moles, moles that have irregular borders, moles that  are larger than a pencil eraser, or moles that have changed in shape or color.  Stay current with required vaccines (immunizations).  Influenza vaccine. All adults should be immunized every year.  Tetanus, diphtheria, and acellular pertussis (Td, Tdap) vaccine. Pregnant women should receive 1 dose of Tdap vaccine during each pregnancy. The dose should be obtained regardless of the length of time since the last dose. Immunization is preferred during the 27th-36th week of gestation. An adult who has not previously received Tdap or who does not know her vaccine status should receive 1 dose of Tdap. This initial dose should be followed by tetanus and diphtheria toxoids (Td) booster doses every 10 years. Adults with an unknown or incomplete history of completing a 3-dose immunization series with Td-containing vaccines should begin or complete a primary immunization series including a Tdap dose. Adults should receive a Td booster every 10 years.  Varicella vaccine. An adult without evidence of immunity to varicella should receive 2 doses or a second dose if she has previously received 1 dose. Pregnant females who do not have evidence of immunity should receive the first dose after pregnancy. This first dose should be obtained before leaving the health care facility. The second dose should be obtained 4-8 weeks after the first dose.  Human papillomavirus (HPV) vaccine. Females aged 13-26 years who have not received the vaccine previously should obtain the 3-dose series. The vaccine is not recommended for use in pregnant females. However, pregnancy testing is not needed before receiving a dose. If a female is found to be pregnant after receiving a dose, no treatment is needed. In that case, the remaining doses should be delayed until after the pregnancy. Immunization is recommended for any person with an immunocompromised condition through the age of 61 years if she did not get any or all doses earlier. During the  3-dose series, the second dose should be obtained 4-8 weeks after the first dose. The third dose should be obtained 24 weeks after the first dose and 16 weeks after the second dose.  Zoster vaccine. One dose is recommended for adults aged 30 years or older unless certain conditions are present.  Measles, mumps, and rubella (MMR) vaccine. Adults born  before 1957 generally are considered immune to measles and mumps. Adults born in 1957 or later should have 1 or more doses of MMR vaccine unless there is a contraindication to the vaccine or there is laboratory evidence of immunity to each of the three diseases. A routine second dose of MMR vaccine should be obtained at least 28 days after the first dose for students attending postsecondary schools, health care workers, or international travelers. People who received inactivated measles vaccine or an unknown type of measles vaccine during 1963-1967 should receive 2 doses of MMR vaccine. People who received inactivated mumps vaccine or an unknown type of mumps vaccine before 1979 and are at high risk for mumps infection should consider immunization with 2 doses of MMR vaccine. For females of childbearing age, rubella immunity should be determined. If there is no evidence of immunity, females who are not pregnant should be vaccinated. If there is no evidence of immunity, females who are pregnant should delay immunization until after pregnancy. Unvaccinated health care workers born before 1957 who lack laboratory evidence of measles, mumps, or rubella immunity or laboratory confirmation of disease should consider measles and mumps immunization with 2 doses of MMR vaccine or rubella immunization with 1 dose of MMR vaccine.  Pneumococcal 13-valent conjugate (PCV13) vaccine. When indicated, a person who is uncertain of his immunization history and has no record of immunization should receive the PCV13 vaccine. All adults 65 years of age and older should receive this  vaccine. An adult aged 19 years or older who has certain medical conditions and has not been previously immunized should receive 1 dose of PCV13 vaccine. This PCV13 should be followed with a dose of pneumococcal polysaccharide (PPSV23) vaccine. Adults who are at high risk for pneumococcal disease should obtain the PPSV23 vaccine at least 8 weeks after the dose of PCV13 vaccine. Adults older than 69 years of age who have normal immune system function should obtain the PPSV23 vaccine dose at least 1 year after the dose of PCV13 vaccine.  Pneumococcal polysaccharide (PPSV23) vaccine. When PCV13 is also indicated, PCV13 should be obtained first. All adults aged 65 years and older should be immunized. An adult younger than age 65 years who has certain medical conditions should be immunized. Any person who resides in a nursing home or long-term care facility should be immunized. An adult smoker should be immunized. People with an immunocompromised condition and certain other conditions should receive both PCV13 and PPSV23 vaccines. People with human immunodeficiency virus (HIV) infection should be immunized as soon as possible after diagnosis. Immunization during chemotherapy or radiation therapy should be avoided. Routine use of PPSV23 vaccine is not recommended for American Indians, [place] Natives, or people younger than 65 years unless there are medical conditions that require PPSV23 vaccine. When indicated, people who have unknown immunization and have no record of immunization should receive PPSV23 vaccine. One-time revaccination 5 years after the first dose of PPSV23 is recommended for people aged 19-64 years who have chronic kidney failure, nephrotic syndrome, asplenia, or immunocompromised conditions. People who received 1-2 doses of PPSV23 before age 65 years should receive another dose of PPSV23 vaccine at age 65 years or later if at least 5 years have passed since the previous dose. Doses of PPSV23 are not  needed for people immunized with PPSV23 at or after age 65 years.  Meningococcal vaccine. Adults with asplenia or persistent complement component deficiencies should receive 2 doses of quadrivalent meningococcal conjugate (MenACWY-D) vaccine. The doses should be obtained   at least 2 months apart. Microbiologists working with certain meningococcal bacteria, [place] recruits, people at risk during an outbreak, and people who travel to or live in countries with a high rate of meningitis should be immunized. A first-year college student up through age 34 years who is living in a residence hall should receive a dose if she did not receive a dose on or after her 16th birthday. Adults who have certain high-risk conditions should receive one or more doses of vaccine.  Hepatitis A vaccine. Adults who wish to be protected from this disease, have certain high-risk conditions, work with hepatitis A-infected animals, work in hepatitis A research labs, or travel to or work in countries with a high rate of hepatitis A should be immunized. Adults who were previously unvaccinated and who anticipate close contact with an international adoptee during the first 60 days after arrival in the [place] from a country with a high rate of hepatitis A should be immunized.  Hepatitis B vaccine. Adults who wish to be protected from this disease, have certain high-risk conditions, may be exposed to blood or other infectious body fluids, are household contacts or sex partners of hepatitis B positive people, are clients or workers in certain care facilities, or travel to or work in countries with a high rate of hepatitis B should be immunized.  Haemophilus influenzae type b (Hib) vaccine. A previously unvaccinated person with asplenia or sickle cell disease or having a scheduled splenectomy should receive 1 dose of Hib vaccine. Regardless of previous immunization, a recipient of a hematopoietic stem cell transplant should receive a  3-dose series 6-12 months after her successful transplant. Hib vaccine is not recommended for adults with HIV infection. Preventive Services / Frequency Ages 35 to 4 years  Blood pressure check.** / Every 3-5 years.  Lipid and cholesterol check.** / Every 5 years beginning at age 60.  Clinical breast exam.** / Every 3 years for women in their 71s and 10s.  BRCA-related cancer risk assessment.** / For women who have family members with a BRCA-related cancer (breast, ovarian, tubal, or peritoneal cancers).  Pap test.** / Every 2 years from ages 76 through 26. Every 3 years starting at age 61 through age 76 or 93 with a history of 3 consecutive normal Pap tests.  HPV screening.** / Every 3 years from ages 37 through ages 60 to 51 with a history of 3 consecutive normal Pap tests.  Hepatitis C blood test.** / For any individual with known risks for hepatitis C.  Skin self-exam. / Monthly.  Influenza vaccine. / Every year.  Tetanus, diphtheria, and acellular pertussis (Tdap, Td) vaccine.** / Consult your health care provider. Pregnant women should receive 1 dose of Tdap vaccine during each pregnancy. 1 dose of Td every 10 years.  Varicella vaccine.** / Consult your health care provider. Pregnant females who do not have evidence of immunity should receive the first dose after pregnancy.  HPV vaccine. / 3 doses over 6 months, if 93 and younger. The vaccine is not recommended for use in pregnant females. However, pregnancy testing is not needed before receiving a dose.  Measles, mumps, rubella (MMR) vaccine.** / You need at least 1 dose of MMR if you were born in 1957 or later. You may also need a 2nd dose. For females of childbearing age, rubella immunity should be determined. If there is no evidence of immunity, females who are not pregnant should be vaccinated. If there is no evidence of immunity, females who are  pregnant should delay immunization until after pregnancy.  Pneumococcal  13-valent conjugate (PCV13) vaccine.** / Consult your health care provider.  Pneumococcal polysaccharide (PPSV23) vaccine.** / 1 to 2 doses if you smoke cigarettes or if you have certain conditions.  Meningococcal vaccine.** / 1 dose if you are age 68 to 8 years and a Market researcher living in a residence hall, or have one of several medical conditions, you need to get vaccinated against meningococcal disease. You may also need additional booster doses.  Hepatitis A vaccine.** / Consult your health care provider.  Hepatitis B vaccine.** / Consult your health care provider.  Haemophilus influenzae type b (Hib) vaccine.** / Consult your health care provider. Ages 7 to 53 years  Blood pressure check.** / Every year.  Lipid and cholesterol check.** / Every 5 years beginning at age 25 years.  Lung cancer screening. / Every year if you are aged 11-80 years and have a 30-pack-year history of smoking and currently smoke or have quit within the past 15 years. Yearly screening is stopped once you have quit smoking for at least 15 years or develop a health problem that would prevent you from having lung cancer treatment.  Clinical breast exam.** / Every year after age 48 years.  BRCA-related cancer risk assessment.** / For women who have family members with a BRCA-related cancer (breast, ovarian, tubal, or peritoneal cancers).  Mammogram.** / Every year beginning at age 41 years and continuing for as long as you are in good health. Consult with your health care provider.  Pap test.** / Every 3 years starting at age 65 years through age 37 or 70 years with a history of 3 consecutive normal Pap tests.  HPV screening.** / Every 3 years from ages 72 years through ages 60 to 40 years with a history of 3 consecutive normal Pap tests.  Fecal occult blood test (FOBT) of stool. / Every year beginning at age 21 years and continuing until age 5 years. You may not need to do this test if you get  a colonoscopy every 10 years.  Flexible sigmoidoscopy or colonoscopy.** / Every 5 years for a flexible sigmoidoscopy or every 10 years for a colonoscopy beginning at age 35 years and continuing until age 48 years.  Hepatitis C blood test.** / For all people born from [birth year] through 1965 and any individual with known risks for hepatitis C.  Skin self-exam. / Monthly.  Influenza vaccine. / Every year.  Tetanus, diphtheria, and acellular pertussis (Tdap/Td) vaccine.** / Consult your health care provider. Pregnant women should receive 1 dose of Tdap vaccine during each pregnancy. 1 dose of Td every 10 years.  Varicella vaccine.** / Consult your health care provider. Pregnant females who do not have evidence of immunity should receive the first dose after pregnancy.  Zoster vaccine.** / 1 dose for adults aged 30 years or older.  Measles, mumps, rubella (MMR) vaccine.** / You need at least 1 dose of MMR if you were born in 1957 or later. You may also need a second dose. For females of childbearing age, rubella immunity should be determined. If there is no evidence of immunity, females who are not pregnant should be vaccinated. If there is no evidence of immunity, females who are pregnant should delay immunization until after pregnancy.  Pneumococcal 13-valent conjugate (PCV13) vaccine.** / Consult your health care provider.  Pneumococcal polysaccharide (PPSV23) vaccine.** / 1 to 2 doses if you smoke cigarettes or if you have certain conditions.  Meningococcal vaccine.** /  Consult your health care provider.  Hepatitis A vaccine.** / Consult your health care provider.  Hepatitis B vaccine.** / Consult your health care provider.  Haemophilus influenzae type b (Hib) vaccine.** / Consult your health care provider. Ages 64 years and over  Blood pressure check.** / Every year.  Lipid and cholesterol check.** / Every 5 years beginning at age 23 years.  Lung cancer screening. / Every year if you  are aged 16-80 years and have a 30-pack-year history of smoking and currently smoke or have quit within the past 15 years. Yearly screening is stopped once you have quit smoking for at least 15 years or develop a health problem that would prevent you from having lung cancer treatment.  Clinical breast exam.** / Every year after age 74 years.  BRCA-related cancer risk assessment.** / For women who have family members with a BRCA-related cancer (breast, ovarian, tubal, or peritoneal cancers).  Mammogram.** / Every year beginning at age 44 years and continuing for as long as you are in good health. Consult with your health care provider.  Pap test.** / Every 3 years starting at age 58 years through age 22 or 39 years with 3 consecutive normal Pap tests. Testing can be stopped between 65 and 70 years with 3 consecutive normal Pap tests and no abnormal Pap or HPV tests in the past 10 years.  HPV screening.** / Every 3 years from ages 64 years through ages 70 or 61 years with a history of 3 consecutive normal Pap tests. Testing can be stopped between 65 and 70 years with 3 consecutive normal Pap tests and no abnormal Pap or HPV tests in the past 10 years.  Fecal occult blood test (FOBT) of stool. / Every year beginning at age 40 years and continuing until age 27 years. You may not need to do this test if you get a colonoscopy every 10 years.  Flexible sigmoidoscopy or colonoscopy.** / Every 5 years for a flexible sigmoidoscopy or every 10 years for a colonoscopy beginning at age 7 years and continuing until age 32 years.  Hepatitis C blood test.** / For all people born from [birth year] through 1965 and any individual with known risks for hepatitis C.  Osteoporosis screening.** / A one-time screening for women ages 30 years and over and women at risk for fractures or osteoporosis.  Skin self-exam. / Monthly.  Influenza vaccine. / Every year.  Tetanus, diphtheria, and acellular pertussis (Tdap/Td)  vaccine.** / 1 dose of Td every 10 years.  Varicella vaccine.** / Consult your health care provider.  Zoster vaccine.** / 1 dose for adults aged 35 years or older.  Pneumococcal 13-valent conjugate (PCV13) vaccine.** / Consult your health care provider.  Pneumococcal polysaccharide (PPSV23) vaccine.** / 1 dose for all adults aged 46 years and older.  Meningococcal vaccine.** / Consult your health care provider.  Hepatitis A vaccine.** / Consult your health care provider.  Hepatitis B vaccine.** / Consult your health care provider.  Haemophilus influenzae type b (Hib) vaccine.** / Consult your health care provider. ** Family history and personal history of risk and conditions may change your health care provider's recommendations.   This information is not intended to replace advice given to you by your health care provider. Make sure you discuss any questions you have with your health care provider.   Document Released: 04/16/2001 Document Revised: 03/11/2014 Document Reviewed: 07/16/2010 Elsevier Interactive Patient Education [employer].

## 2014-12-19 NOTE — Progress Notes (Signed)
Pre visit review using our clinic review tool, if applicable. No additional management support is needed unless otherwise documented below in the visit note. 

## 2014-12-20 LAB — HEPATITIS C ANTIBODY: HCV Ab: NEGATIVE

## 2014-12-23 ENCOUNTER — Inpatient Hospital Stay (HOSPITAL_BASED_OUTPATIENT_CLINIC_OR_DEPARTMENT_OTHER): Admission: RE | Admit: 2014-12-23 | Payer: Self-pay | Source: Ambulatory Visit

## 2014-12-25 ENCOUNTER — Encounter: Payer: Self-pay | Admitting: Family Medicine

## 2014-12-25 DIAGNOSIS — M62838 Other muscle spasm: Secondary | ICD-10-CM

## 2014-12-25 DIAGNOSIS — Z0000 Encounter for general adult medical examination without abnormal findings: Secondary | ICD-10-CM

## 2014-12-25 HISTORY — DX: Encounter for general adult medical examination without abnormal findings: Z00.00

## 2014-12-25 HISTORY — DX: Other muscle spasm: M62.838

## 2014-12-25 NOTE — Assessment & Plan Note (Signed)
Well controlled, no changes to meds. Encouraged heart healthy diet such as the DASH diet and exercise as tolerated.  °

## 2014-12-25 NOTE — Progress Notes (Signed)
Subjective:    Patient ID: Brenda [name], female    DOB: 03-20-1945, 69 y.o.   MRN: [id number]  Chief Complaint  Patient presents with  . Annual Exam    HPI Patient is in today for annual exam. Is generally doing well. No recent illness. Has been following with ophthalmology, Dr. Julien Girt. Denies any polyuria or polydipsia. Does note some intermittent trouble with low back pain secondary to a bulging disc. No radicular symptoms. No incontinence. Is doing well with ADLs at home. Denies CP/palp/SOB/HA/congestion/fevers/GI or GU c/o. Taking meds as prescribed  Past Medical History  Diagnosis Date  . History of chicken pox     childhood age 10  . Depression     counseling  . GERD (gastroesophageal reflux disease)   . Seasonal allergies   . Hypertension   . Hyperlipidemia     2011  . Other and unspecified hyperlipidemia 04/26/2012  . Other and unspecified hyperlipidemia 08/24/2012  . Urinary incontinence 08/24/2012  . Esophageal reflux 08/24/2012  . Obesity 10/17/2010  . Diabetes mellitus type 2 in obese ([place]) 01/31/2013  . Medicare annual wellness visit, subsequent 12/25/2014    Past Surgical History  Procedure Laterality Date  . Total abdominal hysterectomy      2002  . Bladder repair      2002    Family History  Problem Relation Age of Onset  . Colon cancer Sister 77  . Breast cancer Sister 63  . Heart disease Father 25  . Hypertension Father   . Dementia Mother   . Cancer Paternal Grandfather     bone    Social History   Social History  . Marital Status: Divorced    Spouse Name: N/A  . Number of Children: N/A  . Years of Education: N/A   Occupational History  . Not on file.   Social History Main Topics  . Smoking status: Former Smoker -- 0.20 packs/day for 15 years    Types: Cigarettes  . Smokeless tobacco: Never Used     Comment: Qquit 10 years ago  . Alcohol Use: No  . Drug Use: No  . Sexual Activity: Not on file   Other Topics Concern  . Not on  file   Social History Narrative    Outpatient Prescriptions Prior to Visit  Medication Sig Dispense Refill  . acetaminophen (TYLENOL ARTHRITIS PAIN) 650 MG CR tablet Take 1,300 mg by mouth daily.    Marland Kitchen aspirin 81 MG tablet Take 81 mg by mouth daily.    Marland Kitchen escitalopram (LEXAPRO) 20 MG tablet TAKE ONE TABLET BY MOUTH ONCE DAILY 30 tablet 0  . hydrochlorothiazide (HYDRODIURIL) 25 MG tablet TAKE ONE TABLET BY MOUTH ONCE DAILY 30 tablet 0  . losartan (COZAAR) 50 MG tablet Take 1 tablet (50 mg total) by mouth 2 (two) times daily. 60 tablet 0  . pravastatin (PRAVACHOL) 20 MG tablet Take 1 tablet (20 mg total) by mouth daily. 30 tablet 0  . Probiotic Product (PROBIOTIC DAILY) CAPS Take 1 capsule by mouth daily.    . ranitidine (ZANTAC) 300 MG tablet TAKE 1 TABLET (300 MG) BY MOUTH AT BEDTIME AS NEEDED FOR HEARTBURN. 30 tablet 1  . TURMERIC PO Take 1 capsule by mouth daily.    . cyclobenzaprine (FLEXERIL) 5 MG tablet TAKE ONE TABLET BY MOUTH AT BEDTIME AS NEEDED FOR MUSCLE SPASM 30 tablet 0  . cyclobenzaprine (FLEXERIL) 5 MG tablet TAKE ONE TABLET BY MOUTH AT BEDTIME AS NEEDED FOR MUSCLE SPASM 30  tablet 1  . hydrochlorothiazide (HYDRODIURIL) 25 MG tablet TAKE ONE TABLET BY MOUTH ONCE DAILY 30 tablet 0  . pravastatin (PRAVACHOL) 20 MG tablet TAKE ONE TABLET BY MOUTH ONCE DAILY 30 tablet 0   No facility-administered medications prior to visit.    Allergies  Allergen Reactions  . Mobic [Meloxicam] Nausea Only    Review of Systems  Constitutional: Negative for fever and malaise/fatigue.  HENT: Negative for congestion.   Eyes: Negative for discharge.  Respiratory: Negative for shortness of breath.   Cardiovascular: Negative for chest pain, palpitations and leg swelling.  Gastrointestinal: Negative for nausea and abdominal pain.  Genitourinary: Negative for dysuria.  Musculoskeletal: Negative for falls.  Skin: Negative for rash.  Neurological: Negative for loss of consciousness and headaches.    Endo/Heme/Allergies: Negative for environmental allergies.  Psychiatric/Behavioral: Negative for depression. The patient is not nervous/anxious.        Objective:    Physical Exam  Constitutional: She is oriented to person, place, and time. She appears well-developed and well-nourished. No distress.  HENT:  Head: Normocephalic and atraumatic.  Nose: Nose normal.  Eyes: Right eye exhibits no discharge. Left eye exhibits no discharge.  Neck: Normal range of motion. Neck supple.  Cardiovascular: Normal rate and regular rhythm.   No murmur heard. Pulmonary/Chest: Effort normal and breath sounds normal.  Abdominal: Soft. Bowel sounds are normal. She exhibits no mass. There is no tenderness. There is no rebound and no guarding.  Musculoskeletal: She exhibits no edema.  Neurological: She is alert and oriented to person, place, and time.  Skin: Skin is warm and dry.  Psychiatric: She has a normal mood and affect.  Nursing note and vitals reviewed.   BP 120/80 mmHg  Pulse 90  Temp(Src) 98.1 F (36.7 C) (Oral)  Ht 5\' 8"  (1.727 m)  Wt 210 lb 4 oz (95.369 kg)  BMI 31.98 kg/m2  SpO2 92% Wt Readings from Last 3 Encounters:  12/19/14 210 lb 4 oz (95.369 kg)  11/28/14 210 lb 11.2 oz (95.573 kg)  05/23/14 210 lb 8 oz (95.482 kg)     Lab Results  Component Value Date   WBC 5.8 12/19/2014   HGB 14.9 12/19/2014   HCT 45.3 12/19/2014   PLT 348.0 12/19/2014   GLUCOSE 103* 12/20/2013   CHOL 193 12/19/2014   TRIG 177.0* 12/19/2014   HDL 52.00 12/19/2014   LDLCALC 106* 12/19/2014   ALT 14 12/20/2013   AST 16 12/20/2013   NA 138 12/20/2013   K 3.8 12/20/2013   CL 103 12/20/2013   CREATININE 0.7 12/20/2013   BUN 20 12/20/2013   CO2 26 12/20/2013   TSH 0.93 12/19/2014   HGBA1C 6.4 12/19/2014   MICROALBUR 0.9 12/19/2014    Lab Results  Component Value Date   TSH 0.93 12/19/2014   Lab Results  Component Value Date   WBC 5.8 12/19/2014   HGB 14.9 12/19/2014   HCT 45.3  12/19/2014   MCV 90.0 12/19/2014   PLT 348.0 12/19/2014   Lab Results  Component Value Date   NA 138 12/20/2013   K 3.8 12/20/2013   CO2 26 12/20/2013   GLUCOSE 103* 12/20/2013   BUN 20 12/20/2013   CREATININE 0.7 12/20/2013   BILITOT 0.5 12/20/2013   ALKPHOS 81 12/20/2013   AST 16 12/20/2013   ALT 14 12/20/2013   PROT 7.9 12/20/2013   ALBUMIN 3.6 12/20/2013   ALBUMIN 3.6 12/20/2013   CALCIUM 9.1 12/20/2013   GFR 110.60 12/20/2013  Lab Results  Component Value Date   CHOL 193 12/19/2014   Lab Results  Component Value Date   HDL 52.00 12/19/2014   Lab Results  Component Value Date   LDLCALC 106* 12/19/2014   Lab Results  Component Value Date   TRIG 177.0* 12/19/2014   Lab Results  Component Value Date   CHOLHDL 4 12/19/2014   Lab Results  Component Value Date   HGBA1C 6.4 12/19/2014       Assessment & Plan:   Problem List Items Addressed This Visit    Muscle spasm    Encouraged moist heat and gentle stretching as tolerated. May try NSAIDs and prescription meds as directed and report if symptoms worsen or seek immediate care. May use cyclobenzaprine prn      Relevant Medications   cyclobenzaprine (FLEXERIL) 10 MG tablet   zoster vaccine live, PF, (ZOSTAVAX) 68032 UNT/0.65ML injection   Other Relevant Orders   TSH (Completed)   CBC (Completed)   Hemoglobin A1c (Completed)   Lipid panel (Completed)   Urinalysis (Completed)   Microalbumin / creatinine urine ratio (Completed)   DG Bone Density   Hepatitis C Antibody (Completed)   Medicare annual wellness visit, subsequent    Patient denies any difficulties at home. No trouble with ADLs, depression or falls. No recent changes to vision or hearing. Is UTD with immunizations. Is UTD with screening. . Encouraged heart healthy diet, exercise as tolerated and adequate sleep. Given and reviewed copy of ACP documents from [facility] Secretary of State and encouraged to complete and return Follows with Dr Hilarie Fredrickson, [facility]  gastroenterology, Dr Melvyn Novas pulmonology, Dr Claiborne Billings cardiology See problem list for risk factors See AVS for proposed preventative health screening      Lumbar disc disease with radiculopathy   Relevant Medications   cyclobenzaprine (FLEXERIL) 10 MG tablet   zoster vaccine live, PF, (ZOSTAVAX) 12248 UNT/0.65ML injection   Other Relevant Orders   TSH (Completed)   CBC (Completed)   Hemoglobin A1c (Completed)   Lipid panel (Completed)   Urinalysis (Completed)   Microalbumin / creatinine urine ratio (Completed)   DG Bone Density   Hepatitis C Antibody (Completed)   Hypertension    Well controlled, no changes to meds. Encouraged heart healthy diet such as the DASH diet and exercise as tolerated.       Relevant Medications   zoster vaccine live, PF, (ZOSTAVAX) 25003 UNT/0.65ML injection   Other Relevant Orders   TSH (Completed)   CBC (Completed)   Hemoglobin A1c (Completed)   Lipid panel (Completed)   Urinalysis (Completed)   Microalbumin / creatinine urine ratio (Completed)   DG Bone Density   Hepatitis C Antibody (Completed)   Hyperlipidemia    Tolerating statin, encouraged heart healthy diet, avoid trans fats, minimize simple carbs and saturated fats. Increase exercise as tolerated      Relevant Medications   zoster vaccine live, PF, (ZOSTAVAX) 70488 UNT/0.65ML injection   Other Relevant Orders   TSH (Completed)   CBC (Completed)   Hemoglobin A1c (Completed)   Lipid panel (Completed)   Urinalysis (Completed)   Microalbumin / creatinine urine ratio (Completed)   DG Bone Density   Hepatitis C Antibody (Completed)   GERD (gastroesophageal reflux disease)    Avoid offending foods, start probiotics. Do not eat large meals in late evening and consider raising head of bed.       Relevant Medications   zoster vaccine live, PF, (ZOSTAVAX) 89169 UNT/0.65ML injection   Other Relevant Orders  TSH (Completed)   CBC (Completed)   Hemoglobin A1c (Completed)   Lipid panel  (Completed)   Urinalysis (Completed)   Microalbumin / creatinine urine ratio (Completed)   DG Bone Density   Hepatitis C Antibody (Completed)   Diabetes mellitus type 2 in obese ([facility])    hgba1c acceptable, minimize simple carbs. Increase exercise as tolerated. Continue current meds      Relevant Medications   zoster vaccine live, PF, (ZOSTAVAX) 34196 UNT/0.65ML injection   Other Relevant Orders   TSH (Completed)   CBC (Completed)   Hemoglobin A1c (Completed)   Lipid panel (Completed)   Urinalysis (Completed)   Microalbumin / creatinine urine ratio (Completed)   DG Bone Density   Hepatitis C Antibody (Completed)    Other Visit Diagnoses    Encounter for immunization    -  Primary    Postmenopausal estrogen deficiency        Relevant Medications    zoster vaccine live, PF, (ZOSTAVAX) 22297 UNT/0.65ML injection    Other Relevant Orders    TSH (Completed)    CBC (Completed)    Hemoglobin A1c (Completed)    Lipid panel (Completed)    Urinalysis (Completed)    Microalbumin / creatinine urine ratio (Completed)    DG Bone Density    Hepatitis C Antibody (Completed)    Need for viral immunization        Relevant Medications    zoster vaccine live, PF, (ZOSTAVAX) 98921 UNT/0.65ML injection    Other Relevant Orders    TSH (Completed)    CBC (Completed)    Hemoglobin A1c (Completed)    Lipid panel (Completed)    Urinalysis (Completed)    Microalbumin / creatinine urine ratio (Completed)    DG Bone Density    Hepatitis C Antibody (Completed)    Need for 23-polyvalent pneumococcal polysaccharide vaccine        Relevant Orders    Pneumococcal polysaccharide vaccine 23-valent greater than or equal to 2yo subcutaneous/IM (Completed)       I have discontinued Ms. Dimario's cyclobenzaprine and cyclobenzaprine. I am also having her start on cyclobenzaprine and zoster vaccine live (PF). Additionally, I am having her maintain her aspirin, PROBIOTIC DAILY, TURMERIC PO, escitalopram,  pravastatin, losartan, ranitidine, acetaminophen, and hydrochlorothiazide.  Meds ordered this encounter  Medications  . cyclobenzaprine (FLEXERIL) 10 MG tablet    Sig: Take 1 tablet (10 mg total) by mouth at bedtime as needed for muscle spasms.    Dispense:  30 tablet    Refill:  5  . zoster vaccine live, PF, (ZOSTAVAX) 19417 UNT/0.65ML injection    Sig: Inject 19,400 Units into the skin once.    Dispense:  1 each    Refill:  0     Penni Homans, MD

## 2014-12-25 NOTE — Assessment & Plan Note (Addendum)
Patient denies any difficulties at home. No trouble with ADLs, depression or falls. No recent changes to vision or hearing. Is UTD with immunizations. Is UTD with screening. . Encouraged heart healthy diet, exercise as tolerated and adequate sleep. Given and reviewed copy of ACP documents from [facility] Secretary of State and encouraged to complete and return Follows with Dr Hilarie Fredrickson, [facility], Dr Melvyn Novas pulmonology, Dr Claiborne Billings cardiology See problem list for risk factors See AVS for proposed preventative health screening

## 2014-12-25 NOTE — Assessment & Plan Note (Signed)
Avoid offending foods, start probiotics. Do not eat large meals in late evening and consider raising head of bed.  

## 2014-12-25 NOTE — Assessment & Plan Note (Signed)
Tolerating statin, encouraged heart healthy diet, avoid trans fats, minimize simple carbs and saturated fats. Increase exercise as tolerated 

## 2014-12-25 NOTE — Assessment & Plan Note (Signed)
Encouraged moist heat and gentle stretching as tolerated. May try NSAIDs and prescription meds as directed and report if symptoms worsen or seek immediate care. May use cyclobenzaprine prn

## 2014-12-25 NOTE — Assessment & Plan Note (Signed)
hgba1c acceptable, minimize simple carbs. Increase exercise as tolerated. Continue current meds 

## 2014-12-26 ENCOUNTER — Telehealth: Payer: Self-pay | Admitting: Family Medicine

## 2014-12-26 DIAGNOSIS — I10 Essential (primary) hypertension: Secondary | ICD-10-CM

## 2014-12-26 MED ORDER — TRIAMTERENE-HCTZ 37.5-25 MG PO TABS: 1.0000 | ORAL_TABLET | Freq: Every day | ORAL | Status: DC

## 2014-12-26 MED ORDER — HYDROCHLOROTHIAZIDE 25 MG PO TABS: 25.0000 mg | ORAL_TABLET | Freq: Every day | ORAL | Status: DC

## 2014-12-26 NOTE — Telephone Encounter (Signed)
We actually talked about switching her from HCTZ to  Maxzide 37.5/25 1 tab daily, disp #30 with 3 rf. Recheck bp in 1 month with nurse visit and cmp at that time

## 2014-12-26 NOTE — Telephone Encounter (Signed)
Relation to EQ:ASTM Call back number: [phone] Pharmacy: [facility] [place], [place] [phone] (Phone) [phone] (Fax)         Reason for call:  Patient requesting a refill hydrochlorothiazide (HYDRODIURIL) 25 MG tablet

## 2014-12-26 NOTE — Telephone Encounter (Signed)
Updated medication list, d/c HCTZ and sent in #30 with 3 refills of the new Maxzide 37.5/25. Put order in for CMP in one month.  Remaining instructions to do schedule a nurse visit to check BP.  Called the patient left a message to call back

## 2014-12-26 NOTE — Telephone Encounter (Signed)
Refill done as requested and patient notified.

## 2014-12-26 NOTE — Telephone Encounter (Signed)
Called left message to call back 

## 2014-12-26 NOTE — Telephone Encounter (Signed)
Pt called back stating she thought Dr. Charlett Blake was increasing the strength on this medication. She would like reviewed and let her know. Ph# [phone].

## 2014-12-26 NOTE — Addendum Note (Signed)
Addended by: Sharon Seller B on: 12/26/2014 02:05 PM   Modules accepted: Orders, Medications

## 2014-12-27 NOTE — Telephone Encounter (Signed)
Pt returning call. Best # [phone].

## 2014-12-27 NOTE — Telephone Encounter (Signed)
Called left msg. To call back 

## 2014-12-27 NOTE — Telephone Encounter (Signed)
Called left message to call back. Remaining instructions per PCP is to schedule nurse visit in one month to check BP and lab appointment for the same day to check CMP

## 2014-12-29 NOTE — Telephone Encounter (Signed)
Called left detailed message of instructions she is to do, schedule nurse visit (BP check) appt. As well as lab appt. To recheck CMP (order entered already).

## 2014-12-30 ENCOUNTER — Other Ambulatory Visit: Payer: Self-pay | Admitting: Family Medicine

## 2014-12-30 NOTE — Telephone Encounter (Signed)
Called left a detailed message of PCP instructions to schedule a BP nurse visit check and lab appt. On the same day.

## 2015-01-09 ENCOUNTER — Ambulatory Visit (HOSPITAL_BASED_OUTPATIENT_CLINIC_OR_DEPARTMENT_OTHER)
Admission: RE | Admit: 2015-01-09 | Discharge: 2015-01-09 | Disposition: A | Discharge status: Home / Self Care | Payer: Medicare Other | Source: Ambulatory Visit | Attending: Family Medicine | Admitting: Family Medicine

## 2015-01-09 DIAGNOSIS — Z23 Encounter for immunization: Secondary | ICD-10-CM

## 2015-01-09 DIAGNOSIS — E669 Obesity, unspecified: Secondary | ICD-10-CM

## 2015-01-09 DIAGNOSIS — Z90710 Acquired absence of both cervix and uterus: Secondary | ICD-10-CM | POA: Diagnosis not present

## 2015-01-09 DIAGNOSIS — M5116 Intervertebral disc disorders with radiculopathy, lumbar region: Secondary | ICD-10-CM

## 2015-01-09 DIAGNOSIS — I10 Essential (primary) hypertension: Secondary | ICD-10-CM

## 2015-01-09 DIAGNOSIS — M810 Age-related osteoporosis without current pathological fracture: Secondary | ICD-10-CM | POA: Diagnosis not present

## 2015-01-09 DIAGNOSIS — K219 Gastro-esophageal reflux disease without esophagitis: Secondary | ICD-10-CM

## 2015-01-09 DIAGNOSIS — Z780 Asymptomatic menopausal state: Secondary | ICD-10-CM | POA: Insufficient documentation

## 2015-01-09 DIAGNOSIS — E785 Hyperlipidemia, unspecified: Secondary | ICD-10-CM

## 2015-01-09 DIAGNOSIS — M8580 Other specified disorders of bone density and structure, unspecified site: Secondary | ICD-10-CM | POA: Insufficient documentation

## 2015-01-09 DIAGNOSIS — E1169 Type 2 diabetes mellitus with other specified complication: Secondary | ICD-10-CM

## 2015-01-09 DIAGNOSIS — M62838 Other muscle spasm: Secondary | ICD-10-CM

## 2015-01-25 ENCOUNTER — Other Ambulatory Visit (INDEPENDENT_AMBULATORY_CARE_PROVIDER_SITE_OTHER): Payer: Medicare Other

## 2015-01-25 ENCOUNTER — Ambulatory Visit (INDEPENDENT_AMBULATORY_CARE_PROVIDER_SITE_OTHER): Payer: Medicare Other | Admitting: Physician Assistant

## 2015-01-25 VITALS — BP 135/83 | HR 89

## 2015-01-25 DIAGNOSIS — I10 Essential (primary) hypertension: Secondary | ICD-10-CM

## 2015-01-25 DIAGNOSIS — Z0130 Encounter for examination of blood pressure without abnormal findings: Secondary | ICD-10-CM

## 2015-01-25 LAB — COMPREHENSIVE METABOLIC PANEL
ALBUMIN: 4.4 g/dL (ref 3.6–5.1)
ALK PHOS: 95 U/L (ref 33–130)
ALT: 11 U/L (ref 6–29)
AST: 13 U/L (ref 10–35)
BILIRUBIN TOTAL: 0.4 mg/dL (ref 0.2–1.2)
BUN: 22 mg/dL (ref 7–25)
CALCIUM: 9.6 mg/dL (ref 8.6–10.4)
CO2: 23 mmol/L (ref 20–31)
Chloride: 104 mmol/L (ref 98–110)
Creat: 0.99 mg/dL (ref 0.50–0.99)
GLUCOSE: 108 mg/dL — AB (ref 65–99)
POTASSIUM: 4.2 mmol/L (ref 3.5–5.3)
Sodium: 138 mmol/L (ref 135–146)
TOTAL PROTEIN: 7.5 g/dL (ref 6.1–8.1)

## 2015-01-25 NOTE — Progress Notes (Signed)
Pre visit review using our clinic review tool, if applicable. No additional management support is needed unless otherwise documented below in the visit note.  Patient presented in office today for a blood pressure check per telephone note on 12/26/14. Verified medications with the patient. Readings were as follow: B/P 143/85 P 82  B/P 135/83 P 89  Per Brunetta Jeans, PA-C: Continue current medication regimen and follow-up with PCP as scheduled. Informed the patient of the provider's instructions. She voiced understanding and did not have any questions or concerns prior to leaving the office.

## 2015-01-25 NOTE — Progress Notes (Signed)
Reviewed nursing note. Plan is mine.

## 2015-01-25 NOTE — Addendum Note (Signed)
Addended by: Harl Bowie on: 01/25/2015 03:51 PM   Modules accepted: Orders

## 2015-01-27 NOTE — Progress Notes (Signed)
Note reviewed. No changes

## 2015-03-03 ENCOUNTER — Other Ambulatory Visit: Payer: Self-pay | Admitting: Family Medicine

## 2015-03-27 ENCOUNTER — Encounter: Payer: Self-pay | Admitting: Family Medicine

## 2015-03-27 ENCOUNTER — Ambulatory Visit (INDEPENDENT_AMBULATORY_CARE_PROVIDER_SITE_OTHER): Payer: Commercial Managed Care - HMO | Admitting: Family Medicine

## 2015-03-27 ENCOUNTER — Other Ambulatory Visit: Payer: Self-pay | Admitting: Family Medicine

## 2015-03-27 VITALS — BP 120/84 | HR 102 | Temp 98.6°F | Ht 68.0 in | Wt 211.4 lb

## 2015-03-27 DIAGNOSIS — E785 Hyperlipidemia, unspecified: Secondary | ICD-10-CM | POA: Diagnosis not present

## 2015-03-27 DIAGNOSIS — E669 Obesity, unspecified: Secondary | ICD-10-CM | POA: Diagnosis not present

## 2015-03-27 DIAGNOSIS — R5383 Other fatigue: Secondary | ICD-10-CM | POA: Diagnosis not present

## 2015-03-27 DIAGNOSIS — I10 Essential (primary) hypertension: Secondary | ICD-10-CM

## 2015-03-27 DIAGNOSIS — E1169 Type 2 diabetes mellitus with other specified complication: Secondary | ICD-10-CM

## 2015-03-27 DIAGNOSIS — E119 Type 2 diabetes mellitus without complications: Secondary | ICD-10-CM

## 2015-03-27 DIAGNOSIS — K219 Gastro-esophageal reflux disease without esophagitis: Secondary | ICD-10-CM

## 2015-03-27 NOTE — Progress Notes (Signed)
Pre visit review using our clinic review tool, if applicable. No additional management support is needed unless otherwise documented below in the visit note. 

## 2015-03-27 NOTE — Assessment & Plan Note (Signed)
Encouraged DASH diet, decrease po intake and increase exercise as tolerated. Needs 7-8 hours of sleep nightly. Avoid trans fats, eat small, frequent meals every 4-5 hours with lean proteins, complex carbs and healthy fats. Minimize simple carbs, GMO foods. 

## 2015-03-27 NOTE — Patient Instructions (Signed)
NOW company probiotics 10 strain probiotic daily, [email or web address]   Basic Carbohydrate Counting for Diabetes Mellitus Carbohydrate counting is a method for keeping track of the amount of carbohydrates you eat. Eating carbohydrates naturally increases the level of sugar (glucose) in your blood, so it is important for you to know the amount that is okay for you to have in every meal. Carbohydrate counting helps keep the level of glucose in your blood within normal limits. The amount of carbohydrates allowed is different for every person. A dietitian can help you calculate the amount that is right for you. Once you know the amount of carbohydrates you can have, you can count the carbohydrates in the foods you want to eat. Carbohydrates are found in the following foods:  Grains, such as breads and cereals.  Dried beans and soy products.  Starchy vegetables, such as potatoes, peas, and corn.  Fruit and fruit juices.  Milk and yogurt.  Sweets and snack foods, such as cake, cookies, candy, chips, soft drinks, and fruit drinks. CARBOHYDRATE COUNTING There are two ways to count the carbohydrates in your food. You can use either of the methods or a combination of both. Reading the "Nutrition Facts" on [place] The "Nutrition Facts" is an area that is included on the labels of almost all packaged food and beverages in the [place]. It includes the serving size of that food or beverage and information about the nutrients in each serving of the food, including the grams (g) of carbohydrate per serving.  Decide the number of servings of this food or beverage that you will be able to eat or drink. Multiply that number of servings by the number of grams of carbohydrate that is listed on the label for that serving. The total will be the amount of carbohydrates you will be having when you eat or drink this food or beverage. Learning Standard Serving Sizes of Food When you eat food that is not  packaged or does not include "Nutrition Facts" on the label, you need to measure the servings in order to count the amount of carbohydrates.A serving of most carbohydrate-rich foods contains about 15 g of carbohydrates. The following list includes serving sizes of carbohydrate-rich foods that provide 15 g ofcarbohydrate per serving:   1 slice of bread (1 oz) or 1 six-inch tortilla.    of a hamburger bun or English muffin.  4-6 crackers.   cup unsweetened dry cereal.    cup hot cereal.   cup rice or pasta.    cup mashed potatoes or  of a large baked potato.  1 cup fresh fruit or one small piece of fruit.    cup canned or frozen fruit or fruit juice.  1 cup milk.   cup plain fat-free yogurt or yogurt sweetened with artificial sweeteners.   cup cooked dried beans or starchy vegetable, such as peas, corn, or potatoes.  Decide the number of standard-size servings that you will eat. Multiply that number of servings by 15 (the grams of carbohydrates in that serving). For example, if you eat 2 cups of strawberries, you will have eaten 2 servings and 30 g of carbohydrates (2 servings x 15 g = 30 g). For foods such as soups and casseroles, in which more than one food is mixed in, you will need to count the carbohydrates in each food that is included. EXAMPLE OF CARBOHYDRATE COUNTING Sample Dinner  3 oz chicken breast.   cup of brown rice.   cup of corn.  1 cup milk.   1 cup strawberries with sugar-free whipped topping.  Carbohydrate Calculation Step 1: Identify the foods that contain carbohydrates:   Rice.   Corn.   Milk.   Strawberries. Step 2:Calculate the number of servings eaten of each:   2 servings of rice.   1 serving of corn.   1 serving of milk.   1 serving of strawberries. Step 3: Multiply each of those number of servings by 15 g:   2 servings of rice x 15 g = 30 g.   1 serving of corn x 15 g = 15 g.   1 serving of milk x 15  g = 15 g.   1 serving of strawberries x 15 g = 15 g. Step 4: Add together all of the amounts to find the total grams of carbohydrates eaten: 30 g + 15 g + 15 g + 15 g = 75 g.   This information is not intended to replace advice given to you by your health care provider. Make sure you discuss any questions you have with your health care provider.   Document Released: 02/18/2005 Document Revised: 03/11/2014 Document Reviewed: 01/15/2013 Elsevier Interactive Patient Education [employer].

## 2015-03-27 NOTE — Progress Notes (Signed)
Subjective:    Patient ID: Brenda [name], female    DOB: [birth year], 70 y.o.   MRN: [id number]  Chief Complaint  Patient presents with  . Follow-up    HPI Patient is in today for follow up. Is feeling well. No recent hospital visits. Denies headaches. No fevers or acute concerns. Denies CP/palp/SOB/HA/congestion/fevers/GI or GU c/o. Taking meds as prescribed Past Medical History  Diagnosis Date  . History of chicken pox     childhood age 13  . Depression     counseling  . GERD (gastroesophageal reflux disease)   . Seasonal allergies   . Hypertension   . Hyperlipidemia     2011  . Other and unspecified hyperlipidemia 04/26/2012  . Other and unspecified hyperlipidemia 08/24/2012  . Urinary incontinence 08/24/2012  . Esophageal reflux 08/24/2012  . Obesity 10/17/2010  . Diabetes mellitus type 2 in obese ([place]) 01/31/2013  . Medicare annual wellness visit, subsequent 12/25/2014    Past Surgical History  Procedure Laterality Date  . Total abdominal hysterectomy      2002  . Bladder repair      2002    Family History  Problem Relation Age of Onset  . Colon cancer Sister 35  . Breast cancer Sister 62  . Heart disease Father 5  . Hypertension Father   . Dementia Mother   . Cancer Paternal Grandfather     bone    Social History   Social History  . Marital Status: Divorced    Spouse Name: N/A  . Number of Children: N/A  . Years of Education: N/A   Occupational History  . Not on file.   Social History Main Topics  . Smoking status: Former Smoker -- 0.20 packs/day for 15 years    Types: Cigarettes  . Smokeless tobacco: Never Used     Comment: Qquit 10 years ago  . Alcohol Use: No  . Drug Use: No  . Sexual Activity: Not on file   Other Topics Concern  . Not on file   Social History Narrative    Outpatient Prescriptions Prior to Visit  Medication Sig Dispense Refill  . acetaminophen (TYLENOL ARTHRITIS PAIN) 650 MG CR tablet Take 1,300 mg by mouth daily.     Marland Kitchen aspirin 81 MG tablet Take 81 mg by mouth daily.    Marland Kitchen losartan (COZAAR) 50 MG tablet Take 1 tablet (50 mg total) by mouth 2 (two) times daily. 60 tablet 0  . Probiotic Product (PROBIOTIC DAILY) CAPS Take 1 capsule by mouth daily.    . TURMERIC PO Take 1 capsule by mouth daily.    Marland Kitchen zoster vaccine live, PF, (ZOSTAVAX) 19147 UNT/0.65ML injection Inject 19,400 Units into the skin once. 1 each 0  . cyclobenzaprine (FLEXERIL) 10 MG tablet Take 1 tablet (10 mg total) by mouth at bedtime as needed for muscle spasms. 30 tablet 5  . escitalopram (LEXAPRO) 20 MG tablet Take 1 tablet (20 mg total) by mouth daily. 30 tablet 0  . pravastatin (PRAVACHOL) 20 MG tablet Take 1 tablet (20 mg total) by mouth daily. 30 tablet 0  . ranitidine (ZANTAC) 300 MG tablet TAKE 1 TABLET (300 MG) BY MOUTH AT BEDTIME AS NEEDED FOR HEARTBURN. 30 tablet 1  . triamterene-hydrochlorothiazide (MAXZIDE-25) 37.5-25 MG tablet Take 1 tablet by mouth daily. 30 tablet 3  . escitalopram (LEXAPRO) 20 MG tablet TAKE ONE TABLET BY MOUTH ONCE DAILY 30 tablet 0  . pravastatin (PRAVACHOL) 20 MG tablet TAKE ONE TABLET BY  MOUTH ONCE DAILY 30 tablet 6   No facility-administered medications prior to visit.    Allergies  Allergen Reactions  . Mobic [Meloxicam] Nausea Only    Review of Systems  Constitutional: Negative for fever and malaise/fatigue.  HENT: Negative for congestion.   Eyes: Negative for discharge.  Respiratory: Negative for shortness of breath.   Cardiovascular: Negative for chest pain, palpitations and leg swelling.  Gastrointestinal: Negative for nausea and abdominal pain.  Genitourinary: Negative for dysuria.  Musculoskeletal: Negative for falls.  Skin: Negative for rash.  Neurological: Negative for loss of consciousness and headaches.  Endo/Heme/Allergies: Negative for environmental allergies.  Psychiatric/Behavioral: Negative for depression. The patient is not nervous/anxious.        Objective:    Physical  Exam  Constitutional: She is oriented to person, place, and time. She appears well-developed and well-nourished. No distress.  HENT:  Head: Normocephalic and atraumatic.  Nose: Nose normal.  Eyes: Right eye exhibits no discharge. Left eye exhibits no discharge.  Neck: Normal range of motion. Neck supple.  Cardiovascular: Normal rate and regular rhythm.   No murmur heard. Pulmonary/Chest: Effort normal and breath sounds normal.  Abdominal: Soft. Bowel sounds are normal. There is no tenderness.  Musculoskeletal: She exhibits no edema.  Neurological: She is alert and oriented to person, place, and time.  Skin: Skin is warm and dry.  Psychiatric: She has a normal mood and affect.  Nursing note and vitals reviewed.   BP 120/84 mmHg  Pulse 102  Temp(Src) 98.6 F (37 C) (Oral)  Ht 5\' 8"  (1.727 m)  Wt 211 lb 6 oz (95.879 kg)  BMI 32.15 kg/m2  SpO2 94% Wt Readings from Last 3 Encounters:  03/27/15 211 lb 6 oz (95.879 kg)  12/19/14 210 lb 4 oz (95.369 kg)  11/28/14 210 lb 11.2 oz (95.573 kg)     Lab Results  Component Value Date   WBC 6.1 03/27/2015   HGB 15.8* 03/27/2015   HCT 47.8* 03/27/2015   PLT 366.0 03/27/2015   GLUCOSE 89 03/27/2015   CHOL 203* 03/27/2015   TRIG 185.0* 03/27/2015   HDL 52.80 03/27/2015   LDLCALC 113* 03/27/2015   ALT 13 03/27/2015   AST 14 03/27/2015   NA 139 03/27/2015   K 4.0 03/27/2015   CL 105 03/27/2015   CREATININE 0.80 03/27/2015   BUN 20 03/27/2015   CO2 23 03/27/2015   TSH 2.05 03/27/2015   HGBA1C 6.5 03/27/2015   MICROALBUR 2.3* 03/27/2015    Lab Results  Component Value Date   TSH 2.05 03/27/2015   Lab Results  Component Value Date   WBC 6.1 03/27/2015   HGB 15.8* 03/27/2015   HCT 47.8* 03/27/2015   MCV 89.7 03/27/2015   PLT 366.0 03/27/2015   Lab Results  Component Value Date   NA 139 03/27/2015   K 4.0 03/27/2015   CO2 23 03/27/2015   GLUCOSE 89 03/27/2015   BUN 20 03/27/2015   CREATININE 0.80 03/27/2015    BILITOT 0.3 03/27/2015   ALKPHOS 84 03/27/2015   AST 14 03/27/2015   ALT 13 03/27/2015   PROT 7.5 03/27/2015   ALBUMIN 4.3 03/27/2015   CALCIUM 9.6 03/27/2015   GFR 91.35 03/27/2015   Lab Results  Component Value Date   CHOL 203* 03/27/2015   Lab Results  Component Value Date   HDL 52.80 03/27/2015   Lab Results  Component Value Date   LDLCALC 113* 03/27/2015   Lab Results  Component Value Date  TRIG 185.0* 03/27/2015   Lab Results  Component Value Date   CHOLHDL 4 03/27/2015   Lab Results  Component Value Date   HGBA1C 6.5 03/27/2015       Assessment & Plan:   Problem List Items Addressed This Visit    Diabetes mellitus type 2 in obese ([place])    hgba1c acceptable, minimize simple carbs. Increase exercise as tolerated. Continue current meds      Relevant Orders   Hemoglobin A1c (Completed)   Microalbumin / creatinine urine ratio (Completed)   GERD (gastroesophageal reflux disease)    hgba1c acceptable, minimize simple carbs. Increase exercise as tolerated. Continue current meds      Hyperlipidemia    Tolerating statin, encouraged heart healthy diet, avoid trans fats, minimize simple carbs and saturated fats. Increase exercise as tolerated      Relevant Orders   Lipid panel (Completed)   Hypertension    Well controlled, no changes to meds. Encouraged heart healthy diet such as the DASH diet and exercise as tolerated.       Relevant Orders   CBC (Completed)   Comprehensive metabolic panel (Completed)   Obesity - Primary    Encouraged DASH diet, decrease po intake and increase exercise as tolerated. Needs 7-8 hours of sleep nightly. Avoid trans fats, eat small, frequent meals every 4-5 hours with lean proteins, complex carbs and healthy fats. Minimize simple carbs, GMO foods.      Relevant Orders   CBC (Completed)   Comprehensive metabolic panel (Completed)   TSH (Completed)    Other Visit Diagnoses    Other fatigue        Relevant Orders    TSH  (Completed)       I have discontinued Ms. Eastep's escitalopram and pravastatin. I am also having her maintain her aspirin, PROBIOTIC DAILY, TURMERIC PO, losartan, acetaminophen, and zoster vaccine live (PF).  No orders of the defined types were placed in this encounter.     Willette Alma, MD

## 2015-03-28 ENCOUNTER — Other Ambulatory Visit: Payer: Self-pay | Admitting: Family Medicine

## 2015-03-28 DIAGNOSIS — M62838 Other muscle spasm: Secondary | ICD-10-CM

## 2015-03-28 LAB — MICROALBUMIN / CREATININE URINE RATIO
Creatinine,U: 191.4 mg/dL
Microalb Creat Ratio: 1.2 mg/g (ref 0.0–30.0)
Microalb, Ur: 2.3 mg/dL — ABNORMAL HIGH (ref 0.0–1.9)

## 2015-03-28 LAB — COMPREHENSIVE METABOLIC PANEL
ALK PHOS: 84 U/L (ref 39–117)
ALT: 13 U/L (ref 0–35)
AST: 14 U/L (ref 0–37)
Albumin: 4.3 g/dL (ref 3.5–5.2)
BILIRUBIN TOTAL: 0.3 mg/dL (ref 0.2–1.2)
BUN: 20 mg/dL (ref 6–23)
CO2: 23 mEq/L (ref 19–32)
CREATININE: 0.8 mg/dL (ref 0.40–1.20)
Calcium: 9.6 mg/dL (ref 8.4–10.5)
Chloride: 105 mEq/L (ref 96–112)
GFR: 91.35 mL/min (ref >60.00)
GLUCOSE: 89 mg/dL (ref 70–99)
Potassium: 4 mEq/L (ref 3.5–5.1)
Sodium: 139 mEq/L (ref 135–145)
Total Protein: 7.5 g/dL (ref 6.0–8.3)

## 2015-03-28 LAB — CBC
HCT: 47.8 % — ABNORMAL HIGH (ref 36.0–46.0)
HEMOGLOBIN: 15.8 g/dL — AB (ref 12.0–15.0)
MCHC: 33.1 g/dL (ref 30.0–36.0)
MCV: 89.7 fl (ref 78.0–100.0)
PLATELETS: 366 10*3/uL (ref 150.0–400.0)
RBC: 5.33 Mil/uL — ABNORMAL HIGH (ref 3.87–5.11)
RDW: 15.1 % (ref 11.5–15.5)
WBC: 6.1 10*3/uL (ref 4.0–10.5)

## 2015-03-28 LAB — LIPID PANEL
CHOLESTEROL: 203 mg/dL — AB (ref 0–200)
HDL: 52.8 mg/dL (ref >39.00)
LDL Cholesterol: 113 mg/dL — ABNORMAL HIGH (ref 0–99)
NonHDL: 150.2
TRIGLYCERIDES: 185 mg/dL — AB (ref 0.0–149.0)
Total CHOL/HDL Ratio: 4
VLDL: 37 mg/dL (ref 0.0–40.0)

## 2015-03-28 LAB — HEMOGLOBIN A1C: HEMOGLOBIN A1C: 6.5 % (ref 4.6–6.5)

## 2015-03-28 LAB — TSH: TSH: 2.05 u[IU]/mL (ref 0.35–4.50)

## 2015-03-28 MED ORDER — RANITIDINE HCL 300 MG PO TABS: ORAL_TABLET | ORAL | Status: DC

## 2015-03-28 MED ORDER — ESCITALOPRAM OXALATE 20 MG PO TABS: 20.0000 mg | ORAL_TABLET | Freq: Every day | ORAL | Status: DC

## 2015-03-28 MED ORDER — CYCLOBENZAPRINE HCL 10 MG PO TABS: 10.0000 mg | ORAL_TABLET | Freq: Every evening | ORAL | Status: DC | PRN

## 2015-03-28 MED ORDER — PRAVASTATIN SODIUM 20 MG PO TABS: 20.0000 mg | ORAL_TABLET | Freq: Every day | ORAL | Status: DC

## 2015-03-28 MED ORDER — TRIAMTERENE-HCTZ 37.5-25 MG PO TABS: 1.0000 | ORAL_TABLET | Freq: Every day | ORAL | Status: DC

## 2015-03-28 MED FILL — PRAVASTATIN NA 20 MG TAB: 20 | 90 days supply | Qty: 90 | Fill #0

## 2015-03-28 MED FILL — ESCITALOPRAM 20 MG TABLET: 20 | 90 days supply | Qty: 90 | Fill #0

## 2015-03-28 MED FILL — raNITIdine HCL 300 MG TABS: 300 | 90 days supply | Qty: 90 | Fill #0

## 2015-03-28 MED FILL — CYCLOBENZAPRINE 10 MG TAB: 10 | 30 days supply | Qty: 30 | Fill #0

## 2015-03-28 MED FILL — TRIAMTERENE/HCTZ 37.5/25 TB: 37.5-25 | 90 days supply | Qty: 90 | Fill #0

## 2015-03-29 ENCOUNTER — Telehealth: Payer: Self-pay | Admitting: Family Medicine

## 2015-03-29 NOTE — Telephone Encounter (Signed)
Please call pt on [phone] with lab results when available

## 2015-03-29 NOTE — Telephone Encounter (Addendum)
Patient returning call best # mobile [phone] patient states you can leave a detail message

## 2015-03-29 NOTE — Telephone Encounter (Signed)
Called with results.  See result notes if have any question.

## 2015-04-02 NOTE — Assessment & Plan Note (Signed)
hgba1c acceptable, minimize simple carbs. Increase exercise as tolerated. Continue current meds 

## 2015-04-02 NOTE — Assessment & Plan Note (Signed)
Tolerating statin, encouraged heart healthy diet, avoid trans fats, minimize simple carbs and saturated fats. Increase exercise as tolerated 

## 2015-04-02 NOTE — Assessment & Plan Note (Signed)
Well controlled, no changes to meds. Encouraged heart healthy diet such as the DASH diet and exercise as tolerated.  °

## 2015-05-09 ENCOUNTER — Telehealth: Payer: Self-pay | Admitting: Family Medicine

## 2015-05-09 ENCOUNTER — Ambulatory Visit (INDEPENDENT_AMBULATORY_CARE_PROVIDER_SITE_OTHER): Payer: Commercial Managed Care - HMO | Admitting: Family Medicine

## 2015-05-09 ENCOUNTER — Encounter: Payer: Self-pay | Admitting: Family Medicine

## 2015-05-09 VITALS — BP 153/85 | HR 84 | Temp 98.4°F | Ht 68.0 in | Wt 216.1 lb

## 2015-05-09 DIAGNOSIS — M62838 Other muscle spasm: Secondary | ICD-10-CM | POA: Diagnosis not present

## 2015-05-09 DIAGNOSIS — E1169 Type 2 diabetes mellitus with other specified complication: Secondary | ICD-10-CM

## 2015-05-09 DIAGNOSIS — M5116 Intervertebral disc disorders with radiculopathy, lumbar region: Secondary | ICD-10-CM

## 2015-05-09 DIAGNOSIS — E119 Type 2 diabetes mellitus without complications: Secondary | ICD-10-CM | POA: Diagnosis not present

## 2015-05-09 DIAGNOSIS — D229 Melanocytic nevi, unspecified: Secondary | ICD-10-CM | POA: Diagnosis not present

## 2015-05-09 DIAGNOSIS — I10 Essential (primary) hypertension: Secondary | ICD-10-CM

## 2015-05-09 DIAGNOSIS — E669 Obesity, unspecified: Secondary | ICD-10-CM

## 2015-05-09 HISTORY — DX: Melanocytic nevi, unspecified: D22.9

## 2015-05-09 MED ORDER — LOSARTAN POTASSIUM 50 MG PO TABS: 50.0000 mg | ORAL_TABLET | Freq: Two times a day (BID) | ORAL | Status: DC

## 2015-05-09 MED ORDER — CYCLOBENZAPRINE HCL 10 MG PO TABS: 10.0000 mg | ORAL_TABLET | Freq: Every evening | ORAL | Status: DC | PRN

## 2015-05-09 MED FILL — CYCLOBENZAPRINE 10 MG TAB: 10 | 30 days supply | Qty: 30 | Fill #0

## 2015-05-09 MED FILL — LOSARTAN POTASSIUM 50 MG TA: 50 | 90 days supply | Qty: 180 | Fill #0

## 2015-05-09 NOTE — Assessment & Plan Note (Signed)
Well controlled, no changes to meds. Encouraged heart healthy diet such as the DASH diet and exercise as tolerated.  °

## 2015-05-09 NOTE — Telephone Encounter (Signed)
Called left message to call back 

## 2015-05-09 NOTE — Telephone Encounter (Signed)
Patient stated she was given Losartan today and also is taking Tramterene/HCTZ.  She was not sure if Dr. Charlett Blake was aware of her being on both meds., just wanted to confirm if she is to be taking both of them or not?? Cell number [phone] (ok per patient to leave a message on cell number).

## 2015-05-09 NOTE — Assessment & Plan Note (Signed)
Encouraged moist heat and gentle stretching as tolerated. May try NSAIDs and prescription meds as directed and report if symptoms worsen or seek immediate care. Prescription given for low back brace to use prn

## 2015-05-09 NOTE — Patient Instructions (Signed)

## 2015-05-09 NOTE — Telephone Encounter (Signed)
Relation to [id number] Call back [phone]   Reason for call:  Patient would like to discuss BP medication, PCP prescribed losartan (COZAAR) 50 MG tablet and would like discuss

## 2015-05-09 NOTE — Progress Notes (Signed)
Patient ID: Brenda [name], female   DOB: 11-04-1945, 70 y.o.   MRN: [id number]   Subjective:    Patient ID: Brenda [name], female    DOB: 1945/07/11, 70 y.o.   MRN: [id number]  Chief Complaint  Patient presents with  . Back Pain    Back Pain This is a new problem. The problem occurs daily. The problem has been waxing and waning since onset. The pain is present in the lumbar spine. Pertinent negatives include no abdominal pain, chest pain, dysuria, fever or headaches.   Patient is in today for follow up. Is feeling well today except for some low back pain. She denies any fall or injury. No radicular symptoms or incontinence. No recent febrile illness. Denies CP/palp/SOB/HA/congestion/fevers/GI or GU c/o. Taking meds as prescribed  Past Medical History  Diagnosis Date  . History of chicken pox     childhood age 30  . Depression     counseling  . GERD (gastroesophageal reflux disease)   . Seasonal allergies   . Hypertension   . Hyperlipidemia     2011  . Other and unspecified hyperlipidemia 04/26/2012  . Other and unspecified hyperlipidemia 08/24/2012  . Urinary incontinence 08/24/2012  . Esophageal reflux 08/24/2012  . Obesity 10/17/2010  . Diabetes mellitus type 2 in obese ([place]) 01/31/2013  . Medicare annual wellness visit, subsequent 12/25/2014  . Numerous moles 05/09/2015    Past Surgical History  Procedure Laterality Date  . Total abdominal hysterectomy      2002  . Bladder repair      2002    Family History  Problem Relation Age of Onset  . Colon cancer Sister 40  . Breast cancer Sister 62  . Heart disease Father 48  . Hypertension Father   . Dementia Mother   . Cancer Paternal Grandfather     bone    Social History   Social History  . Marital Status: Divorced    Spouse Name: N/A  . Number of Children: N/A  . Years of Education: N/A   Occupational History  . Not on file.   Social History Main Topics  . Smoking status: Former Smoker -- 0.20 packs/day for 15  years    Types: Cigarettes  . Smokeless tobacco: Never Used     Comment: Qquit 10 years ago  . Alcohol Use: No  . Drug Use: No  . Sexual Activity: Not on file   Other Topics Concern  . Not on file   Social History Narrative    Outpatient Prescriptions Prior to Visit  Medication Sig Dispense Refill  . acetaminophen (TYLENOL ARTHRITIS PAIN) 650 MG CR tablet Take 1,300 mg by mouth daily.    Marland Kitchen aspirin 81 MG tablet Take 81 mg by mouth daily.    Marland Kitchen escitalopram (LEXAPRO) 20 MG tablet Take 1 tablet (20 mg total) by mouth daily. 90 tablet 2  . pravastatin (PRAVACHOL) 20 MG tablet Take 1 tablet (20 mg total) by mouth daily. 90 tablet 2  . Probiotic Product (PROBIOTIC DAILY) CAPS Take 1 capsule by mouth daily.    . ranitidine (ZANTAC) 300 MG tablet TAKE 1 TABLET (300 MG) BY MOUTH AT BEDTIME AS NEEDED FOR HEARTBURN. 90 tablet 2  . triamterene-hydrochlorothiazide (MAXZIDE-25) 37.5-25 MG tablet Take 1 tablet by mouth daily. 90 tablet 2  . TURMERIC PO Take 1 capsule by mouth daily.    Marland Kitchen zoster vaccine live, PF, (ZOSTAVAX) 29562 UNT/0.65ML injection Inject 19,400 Units into the skin once. 1 each 0  .  cyclobenzaprine (FLEXERIL) 10 MG tablet Take 1 tablet (10 mg total) by mouth at bedtime as needed for muscle spasms. 30 tablet 5  . losartan (COZAAR) 50 MG tablet Take 1 tablet (50 mg total) by mouth 2 (two) times daily. 60 tablet 0   No facility-administered medications prior to visit.    Allergies  Allergen Reactions  . Mobic [Meloxicam] Nausea Only    Review of Systems  Constitutional: Negative for fever and malaise/fatigue.  HENT: Negative for congestion.   Eyes: Negative for blurred vision.  Respiratory: Negative for shortness of breath.   Cardiovascular: Negative for chest pain, palpitations and leg swelling.  Gastrointestinal: Negative for nausea, abdominal pain and blood in stool.  Genitourinary: Negative for dysuria and frequency.  Musculoskeletal: Positive for back pain and joint  pain. Negative for falls.  Skin: Negative for rash.  Neurological: Negative for dizziness, loss of consciousness and headaches.  Endo/Heme/Allergies: Negative for environmental allergies.  Psychiatric/Behavioral: Negative for depression. The patient is not nervous/anxious.        Objective:    Physical Exam  Constitutional: She is oriented to person, place, and time. She appears well-developed and well-nourished. No distress.  HENT:  Head: Normocephalic and atraumatic.  Nose: Nose normal.  Eyes: Right eye exhibits no discharge. Left eye exhibits no discharge.  Neck: Normal range of motion. Neck supple.  Cardiovascular: Normal rate and regular rhythm.   No murmur heard. Pulmonary/Chest: Effort normal and breath sounds normal.  Abdominal: Soft. Bowel sounds are normal. There is no tenderness.  Musculoskeletal: She exhibits no edema.  Neurological: She is alert and oriented to person, place, and time.  Skin: Skin is warm and dry.  Psychiatric: She has a normal mood and affect.  Nursing note and vitals reviewed.   BP 153/85 mmHg  Pulse 84  Temp(Src) 98.4 F (36.9 C) (Oral)  Ht 5\' 8"  (1.727 m)  Wt 216 lb 2 oz (98.034 kg)  BMI 32.87 kg/m2  SpO2 95% Wt Readings from Last 3 Encounters:  05/09/15 216 lb 2 oz (98.034 kg)  03/27/15 211 lb 6 oz (95.879 kg)  12/19/14 210 lb 4 oz (95.369 kg)     Lab Results  Component Value Date   WBC 6.1 03/27/2015   HGB 15.8* 03/27/2015   HCT 47.8* 03/27/2015   PLT 366.0 03/27/2015   GLUCOSE 89 03/27/2015   CHOL 203* 03/27/2015   TRIG 185.0* 03/27/2015   HDL 52.80 03/27/2015   LDLCALC 113* 03/27/2015   ALT 13 03/27/2015   AST 14 03/27/2015   NA 139 03/27/2015   K 4.0 03/27/2015   CL 105 03/27/2015   CREATININE 0.80 03/27/2015   BUN 20 03/27/2015   CO2 23 03/27/2015   TSH 2.05 03/27/2015   HGBA1C 6.5 03/27/2015   MICROALBUR 2.3* 03/27/2015    Lab Results  Component Value Date   TSH 2.05 03/27/2015   Lab Results  Component  Value Date   WBC 6.1 03/27/2015   HGB 15.8* 03/27/2015   HCT 47.8* 03/27/2015   MCV 89.7 03/27/2015   PLT 366.0 03/27/2015   Lab Results  Component Value Date   NA 139 03/27/2015   K 4.0 03/27/2015   CO2 23 03/27/2015   GLUCOSE 89 03/27/2015   BUN 20 03/27/2015   CREATININE 0.80 03/27/2015   BILITOT 0.3 03/27/2015   ALKPHOS 84 03/27/2015   AST 14 03/27/2015   ALT 13 03/27/2015   PROT 7.5 03/27/2015   ALBUMIN 4.3 03/27/2015   CALCIUM 9.6 03/27/2015  GFR 91.35 03/27/2015   Lab Results  Component Value Date   CHOL 203* 03/27/2015   Lab Results  Component Value Date   HDL 52.80 03/27/2015   Lab Results  Component Value Date   LDLCALC 113* 03/27/2015   Lab Results  Component Value Date   TRIG 185.0* 03/27/2015   Lab Results  Component Value Date   CHOLHDL 4 03/27/2015   Lab Results  Component Value Date   HGBA1C 6.5 03/27/2015       Assessment & Plan:   Problem List Items Addressed This Visit    Diabetes mellitus type 2 in obese ([place])    hgba1c acceptable, minimize simple carbs. Increase exercise as tolerated. Continue current meds      Relevant Medications   losartan (COZAAR) 50 MG tablet   Hypertension    Well controlled, no changes to meds. Encouraged heart healthy diet such as the DASH diet and exercise as tolerated.       Relevant Medications   losartan (COZAAR) 50 MG tablet   Lumbar disc disease with radiculopathy    Encouraged moist heat and gentle stretching as tolerated. May try NSAIDs and prescription meds as directed and report if symptoms worsen or seek immediate care. Prescription given for low back brace to use prn      Relevant Medications   cyclobenzaprine (FLEXERIL) 10 MG tablet   Muscle spasm - Primary   Relevant Medications   cyclobenzaprine (FLEXERIL) 10 MG tablet   Numerous moles    Left leg abd keft scalp      Relevant Orders   Ambulatory referral to Dermatology   Obesity    Encouraged DASH diet, decrease po intake and  increase exercise as tolerated. Needs 7-8 hours of sleep nightly. Avoid trans fats, eat small, frequent meals every 4-5 hours with lean proteins, complex carbs and healthy fats. Minimize simple carbs         I am having Ms. Nusbaum maintain her aspirin, PROBIOTIC DAILY, TURMERIC PO, acetaminophen, zoster vaccine live (PF), escitalopram, pravastatin, ranitidine, triamterene-hydrochlorothiazide, cyclobenzaprine, and losartan.  Meds ordered this encounter  Medications  . cyclobenzaprine (FLEXERIL) 10 MG tablet    Sig: Take 1 tablet (10 mg total) by mouth at bedtime as needed for muscle spasms.    Dispense:  30 tablet    Refill:  5  . losartan (COZAAR) 50 MG tablet    Sig: Take 1 tablet (50 mg total) by mouth 2 (two) times daily.    Dispense:  180 tablet    Refill:  2     Penni Homans, MD

## 2015-05-09 NOTE — Assessment & Plan Note (Signed)
hgba1c acceptable, minimize simple carbs. Increase exercise as tolerated. Continue current meds 

## 2015-05-09 NOTE — Assessment & Plan Note (Signed)
Encouraged DASH diet, decrease po intake and increase exercise as tolerated. Needs 7-8 hours of sleep nightly. Avoid trans fats, eat small, frequent meals every 4-5 hours with lean proteins, complex carbs and healthy fats. Minimize simple carbs 

## 2015-05-09 NOTE — Telephone Encounter (Signed)
Yes she should be taking both meds

## 2015-05-09 NOTE — Assessment & Plan Note (Signed)
Left leg abd keft scalp

## 2015-05-09 NOTE — Progress Notes (Signed)
Pre visit review using our clinic review tool, if applicable. No additional management support is needed unless otherwise documented below in the visit note. 

## 2015-05-10 NOTE — Telephone Encounter (Signed)
Called the patient informed of PCP instructions.  Left a message on cell number as patient informed ok to do.

## 2015-05-11 NOTE — Telephone Encounter (Signed)
Pt called back in, informed of the message below. Pt expressed understanding.

## 2015-05-19 DIAGNOSIS — D2339 Other benign neoplasm of skin of other parts of face: Secondary | ICD-10-CM | POA: Diagnosis not present

## 2015-06-08 DIAGNOSIS — M539 Dorsopathy, unspecified: Secondary | ICD-10-CM | POA: Diagnosis not present

## 2015-06-08 DIAGNOSIS — M545 Low back pain: Secondary | ICD-10-CM | POA: Diagnosis not present

## 2015-06-23 ENCOUNTER — Other Ambulatory Visit: Payer: Self-pay | Admitting: Family Medicine

## 2015-06-23 DIAGNOSIS — I10 Essential (primary) hypertension: Secondary | ICD-10-CM

## 2015-06-23 DIAGNOSIS — M62838 Other muscle spasm: Secondary | ICD-10-CM

## 2015-06-23 MED ORDER — CYCLOBENZAPRINE HCL 10 MG PO TABS: 10.0000 mg | ORAL_TABLET | Freq: Every evening | ORAL | Status: DC | PRN

## 2015-06-23 MED ORDER — PRAVASTATIN SODIUM 20 MG PO TABS: 20.0000 mg | ORAL_TABLET | Freq: Every day | ORAL | Status: DC

## 2015-06-23 MED ORDER — LOSARTAN POTASSIUM 50 MG PO TABS: 50.0000 mg | ORAL_TABLET | Freq: Two times a day (BID) | ORAL | Status: DC

## 2015-06-23 MED ORDER — RANITIDINE HCL 300 MG PO TABS: ORAL_TABLET | ORAL | Status: DC

## 2015-06-23 MED ORDER — ESCITALOPRAM OXALATE 20 MG PO TABS: 20.0000 mg | ORAL_TABLET | Freq: Every day | ORAL | Status: DC

## 2015-06-23 MED ORDER — TRIAMTERENE-HCTZ 37.5-25 MG PO TABS: 1.0000 | ORAL_TABLET | Freq: Every day | ORAL | Status: DC

## 2015-06-30 ENCOUNTER — Encounter (INDEPENDENT_AMBULATORY_CARE_PROVIDER_SITE_OTHER): Payer: Commercial Managed Care - HMO

## 2015-06-30 NOTE — Progress Notes (Signed)
Pre visit review using our clinic review tool, if applicable. No additional management support is needed unless otherwise documented below in the visit note. 

## 2015-08-28 ENCOUNTER — Telehealth: Payer: Self-pay | Admitting: Cardiovascular Disease

## 2015-08-28 ENCOUNTER — Other Ambulatory Visit: Payer: Self-pay | Admitting: Family Medicine

## 2015-08-28 NOTE — Telephone Encounter (Signed)
Pt lives in [place] now.She would like for Dr Claiborne Billings to transfer her to a doctor in [facility].

## 2015-08-28 NOTE — Telephone Encounter (Signed)
Spoke to patient , RECEIVED A LETTER FOR RECALL  informed patient unable to refer to another practice in [facility], but [facility] has a physician who goes to [facility] to see patient Patient states that will be fine to see Brenda [name] in [place].  RN informed patient, will notify scheduler to make an appointment in Aug or Sept 2017 with Brenda [name]  Instead of Brenda [name].

## 2015-08-30 ENCOUNTER — Telehealth: Payer: Self-pay | Admitting: Cardiovascular Disease

## 2015-08-30 NOTE — Telephone Encounter (Signed)
Ok with me Brenda [name]  

## 2015-08-30 NOTE — Telephone Encounter (Signed)
Brenda [name] is wanting to switch from Dr. Claiborne Billings to Dr. Stanford Breed because she now lives in the [facility] area. Will that be ok with you ?  Thanks

## 2015-09-01 NOTE — Telephone Encounter (Signed)
Ok to switch 

## 2015-09-25 ENCOUNTER — Encounter: Payer: Self-pay | Admitting: Family Medicine

## 2015-09-29 ENCOUNTER — Telehealth: Payer: Self-pay | Admitting: Family Medicine

## 2015-09-29 NOTE — Telephone Encounter (Signed)
Relation to [id number] Call back [phone] Pharmacy:  Reason for call:  Patient requesting a referral to Dr. Stanford Breed [place] location due to enlarge heart.

## 2015-09-29 NOTE — Telephone Encounter (Signed)
Cannot see this diagnosis in problem list. No recent imaging for review. Will defer to PCP.

## 2015-10-01 ENCOUNTER — Other Ambulatory Visit: Payer: Self-pay | Admitting: Family Medicine

## 2015-10-01 DIAGNOSIS — I517 Cardiomegaly: Secondary | ICD-10-CM

## 2015-10-30 NOTE — Progress Notes (Signed)
HPI: FU dyspnea; previously followed by Dr Claiborne Billings. Echo 10/15 showed an ejection fraction of 55-60% and moderate concentric left ventricular hypertrophy.  There was evidence for grade 1 diastolic dysfunction.  There was mild tricuspid regurgitation and mild hypertension with estimated PA pressure 35 mm. Nuclear study 12/15 showed apical thinning but no ischemia; EF 65. Previously felt to need O2 at night by Dr Claiborne Billings. Since last seen, the patient has dyspnea with more extreme activities but not with routine activities. It is relieved with rest. It is not associated with chest pain. There is no orthopnea, PND or pedal edema. There is no syncope or palpitations. There is no exertional chest pain.   Current Outpatient Prescriptions  Medication Sig Dispense Refill  . acetaminophen (TYLENOL ARTHRITIS PAIN) 650 MG CR tablet Take 1,300 mg by mouth daily.    Marland Kitchen aspirin 81 MG tablet Take 81 mg by mouth daily.    . cyclobenzaprine (FLEXERIL) 10 MG tablet TAKE 1 TABLET (10 MG TOTAL) BY MOUTH AT BEDTIME AS NEEDED FOR MUSCLE SPASMS. 90 tablet 0  . escitalopram (LEXAPRO) 20 MG tablet Take 1 tablet (20 mg total) by mouth daily. 90 tablet 2  . losartan (COZAAR) 50 MG tablet Take 1 tablet (50 mg total) by mouth 2 (two) times daily. 180 tablet 2  . pravastatin (PRAVACHOL) 20 MG tablet Take 1 tablet (20 mg total) by mouth daily. 90 tablet 2  . Probiotic Product (PROBIOTIC DAILY) CAPS Take 1 capsule by mouth daily.    . ranitidine (ZANTAC) 300 MG tablet TAKE 1 TABLET (300 MG) BY MOUTH AT BEDTIME AS NEEDED FOR HEARTBURN. 90 tablet 2  . triamterene-hydrochlorothiazide (MAXZIDE-25) 37.5-25 MG tablet Take 1 tablet by mouth daily. 90 tablet 2  . TURMERIC PO Take 1 capsule by mouth daily.    Marland Kitchen zoster vaccine live, PF, (ZOSTAVAX) 65784 UNT/0.65ML injection Inject 19,400 Units into the skin once. 1 each 0   No current facility-administered medications for this visit.     Allergies  Allergen Reactions  . Mobic  [Meloxicam] Nausea Only    Past Medical History:  Diagnosis Date  . Depression    counseling  . Diabetes mellitus type 2 in obese ([place]) 01/31/2013  . Esophageal reflux 08/24/2012  . GERD (gastroesophageal reflux disease)   . History of chicken pox    childhood age 6  . Hyperlipidemia    2011  . Hypertension   . Medicare annual wellness visit, subsequent 12/25/2014  . Numerous moles 05/09/2015  . Obesity 10/17/2010  . Other and unspecified hyperlipidemia 04/26/2012  . Other and unspecified hyperlipidemia 08/24/2012  . Seasonal allergies   . Urinary incontinence 08/24/2012    Past Surgical History:  Procedure Laterality Date  . BLADDER REPAIR     2002  . TOTAL ABDOMINAL HYSTERECTOMY     2002    Social History   Social History  . Marital status: Divorced    Spouse name: N/A  . Number of children: N/A  . Years of education: N/A   Occupational History  . Not on file.   Social History Main Topics  . Smoking status: Former Smoker    Packs/day: 0.20    Years: 15.00    Types: Cigarettes  . Smokeless tobacco: Never Used     Comment: Qquit 10 years ago  . Alcohol use No  . Drug use: No  . Sexual activity: Not on file   Other Topics Concern  . Not on file   Social History  Narrative  . No narrative on file    Family History  Problem Relation Age of Onset  . Colon cancer Sister 45  . Breast cancer Sister 14  . Heart disease Father 84  . Hypertension Father   . Dementia Mother   . Cancer Paternal Grandfather     bone    ROS: chronic back pain but no fevers or chills, productive cough, hemoptysis, dysphasia, odynophagia, melena, hematochezia, dysuria, hematuria, rash, seizure activity, orthopnea, PND, pedal edema, claudication. Remaining systems are negative.  Physical Exam:   There were no vitals taken for this visit.  General:  Well developed/obese in NAD Skin warm/dry Patient not depressed No peripheral clubbing Back-normal HEENT-normal/normal  eyelids Neck supple/normal carotid upstroke bilaterally; no bruits; no JVD; no thyromegaly chest - CTA/ normal expansion CV - RRR/normal S1 and S2; no murmurs, rubs or gallops;  PMI nondisplaced Abdomen -NT/ND, no HSM, no mass, + bowel sounds, no bruit 2+ femoral pulses, no bruits Ext-no edema, chords, 2+ DP Neuro-grossly nonfocal  ECG Normal sinus rhythm at a rate of 86. Right atrial enlargement.  A/P  1 Dyspnea-likely multifactorial including obesity, deconditioning with possible component of sleep apnea. We discussed importance of weight loss, diet and exercise. Approximately 20 minutes spent prior to office visit reviewing previous records.  2 hypertension-blood pressure controlled. Continue present medications.  3 hyperlipidemia continue statin.  4 diabetes mellitus-management per primary care.  Kirk Ruths, MD

## 2015-11-01 ENCOUNTER — Ambulatory Visit (INDEPENDENT_AMBULATORY_CARE_PROVIDER_SITE_OTHER): Payer: Commercial Managed Care - HMO | Admitting: Cardiology

## 2015-11-01 ENCOUNTER — Encounter: Payer: Self-pay | Admitting: Cardiology

## 2015-11-01 ENCOUNTER — Other Ambulatory Visit: Payer: Self-pay | Admitting: Family Medicine

## 2015-11-01 VITALS — BP 118/78 | HR 86 | Ht 68.0 in | Wt 212.1 lb

## 2015-11-01 DIAGNOSIS — E785 Hyperlipidemia, unspecified: Secondary | ICD-10-CM | POA: Diagnosis not present

## 2015-11-01 DIAGNOSIS — I10 Essential (primary) hypertension: Secondary | ICD-10-CM | POA: Diagnosis not present

## 2015-11-01 DIAGNOSIS — R0600 Dyspnea, unspecified: Secondary | ICD-10-CM

## 2015-11-01 NOTE — Patient Instructions (Addendum)
Your physician recommends that you continue on your current medications as directed. Please refer to the Current Medication list given to you today.   Your physician wants you to follow-up in: YEAR WITH DR CRENSHAW  You will receive a reminder letter in the mail two months in advance. If you don't receive a letter, please call our office to schedule the follow-up appointment.  

## 2015-11-22 ENCOUNTER — Telehealth: Payer: Self-pay | Admitting: Family Medicine

## 2015-11-22 NOTE — Telephone Encounter (Signed)
Pt called in to request a referral. She says that she has been having some shoulder pain. Pt would like to be referred to some place in [place] if possible.

## 2015-11-23 ENCOUNTER — Other Ambulatory Visit: Payer: Self-pay | Admitting: Family Medicine

## 2015-11-23 DIAGNOSIS — M25512 Pain in left shoulder: Secondary | ICD-10-CM

## 2015-11-23 NOTE — Telephone Encounter (Signed)
Am willing to place referral but have not seen her since March so which shoulder? Any injury? Just pain or any numbness, weakness etc? Then can place referral

## 2015-11-23 NOTE — Telephone Encounter (Signed)
Patient thinks she may have slept on it the wrong way, has been hurting for 2 weeks. Left shoulder pain, no numbness or weakness. Unable to reach up with left arm for 2 weeks.

## 2015-11-23 NOTE — Telephone Encounter (Signed)
Referral placed for left shoulder pain

## 2015-12-04 DIAGNOSIS — Z1231 Encounter for screening mammogram for malignant neoplasm of breast: Secondary | ICD-10-CM | POA: Diagnosis not present

## 2015-12-04 DIAGNOSIS — G8929 Other chronic pain: Secondary | ICD-10-CM | POA: Diagnosis not present

## 2015-12-04 DIAGNOSIS — M19012 Primary osteoarthritis, left shoulder: Secondary | ICD-10-CM | POA: Diagnosis not present

## 2015-12-04 DIAGNOSIS — M25512 Pain in left shoulder: Secondary | ICD-10-CM | POA: Diagnosis not present

## 2015-12-04 LAB — HM MAMMOGRAPHY

## 2015-12-05 DIAGNOSIS — M19012 Primary osteoarthritis, left shoulder: Secondary | ICD-10-CM

## 2015-12-05 HISTORY — DX: Primary osteoarthritis, left shoulder: M19.012

## 2015-12-07 ENCOUNTER — Encounter: Payer: Self-pay | Admitting: Family Medicine

## 2015-12-13 ENCOUNTER — Telehealth: Payer: Self-pay | Admitting: Family Medicine

## 2015-12-13 NOTE — Telephone Encounter (Signed)
Caller name: Benjaman Kindler Relationship to patient: [facility] Can be reached: [phone] Pharmacy:  Reason for call: Gurdy @ [facility] left a message on voicemail at 4:54 PM 10/10 asking for a referral for this patient to Dr. Linton Rump. States patient has appointment on 10/30. Did not states what specialty or why patient was being seen.

## 2015-12-14 NOTE — Telephone Encounter (Signed)
Called Gurdy at [facility]. Left a detailed message to call back.

## 2015-12-14 NOTE — Telephone Encounter (Signed)
So I need to know specialty and what the diagnosis for referral is so I can place referral

## 2015-12-15 ENCOUNTER — Other Ambulatory Visit: Payer: Self-pay | Admitting: Family Medicine

## 2015-12-15 DIAGNOSIS — G8929 Other chronic pain: Secondary | ICD-10-CM

## 2015-12-15 DIAGNOSIS — M25512 Pain in left shoulder: Principal | ICD-10-CM

## 2015-12-15 DIAGNOSIS — R52 Pain, unspecified: Secondary | ICD-10-CM

## 2015-12-15 NOTE — Telephone Encounter (Signed)
Specialty is Orthopaedics Dr. Letitia Neri, [phone], located [address], [place] [address]. Diagnosis-- M25.512chronic left shoulder pain and G89.29 other chronic pain. Date of appt. When this referral will be needed is 01/01/2016

## 2015-12-22 ENCOUNTER — Ambulatory Visit: Payer: Commercial Managed Care - HMO | Admitting: *Deleted

## 2015-12-22 NOTE — Progress Notes (Deleted)
Pre visit review using our clinic review tool, if applicable. No additional management support is needed unless otherwise documented below in the visit note. 

## 2015-12-28 ENCOUNTER — Telehealth: Payer: Self-pay | Admitting: Family Medicine

## 2015-12-28 NOTE — Telephone Encounter (Signed)
No charge. 

## 2015-12-28 NOTE — Telephone Encounter (Signed)
Patient has an appt with Dr. Linton Rump in [facility] on 01/01/16 for her shoulder. She is wondering if she needs a referral to them before the appt?   Phone: [phone] (can leave a message)

## 2015-12-28 NOTE — Telephone Encounter (Signed)
Patient received a no show letter for her appt on 12/22/15. She states that she came to the appt but they told her to reschedule because it was too early. Her last Medicare Wellness Visit was on 06/30/15. Charge or no charge?

## 2015-12-29 ENCOUNTER — Other Ambulatory Visit: Payer: Self-pay | Admitting: Family Medicine

## 2015-12-29 DIAGNOSIS — M25519 Pain in unspecified shoulder: Secondary | ICD-10-CM

## 2016-01-01 DIAGNOSIS — M19012 Primary osteoarthritis, left shoulder: Secondary | ICD-10-CM | POA: Diagnosis not present

## 2016-01-05 ENCOUNTER — Encounter: Payer: Self-pay | Admitting: Family Medicine

## 2016-01-15 ENCOUNTER — Other Ambulatory Visit: Payer: Self-pay | Admitting: Family Medicine

## 2016-01-15 DIAGNOSIS — I10 Essential (primary) hypertension: Secondary | ICD-10-CM

## 2016-02-19 DIAGNOSIS — I10 Essential (primary) hypertension: Secondary | ICD-10-CM | POA: Diagnosis not present

## 2016-02-19 DIAGNOSIS — H5203 Hypermetropia, bilateral: Secondary | ICD-10-CM | POA: Diagnosis not present

## 2016-02-19 DIAGNOSIS — H11442 Conjunctival cysts, left eye: Secondary | ICD-10-CM | POA: Diagnosis not present

## 2016-02-29 ENCOUNTER — Ambulatory Visit (INDEPENDENT_AMBULATORY_CARE_PROVIDER_SITE_OTHER): Payer: Commercial Managed Care - HMO | Admitting: Family Medicine

## 2016-02-29 ENCOUNTER — Encounter: Payer: Self-pay | Admitting: Family Medicine

## 2016-02-29 VITALS — BP 125/71 | HR 90 | Temp 98.1°F | Ht 68.0 in | Wt 218.2 lb

## 2016-02-29 DIAGNOSIS — R059 Cough, unspecified: Secondary | ICD-10-CM

## 2016-02-29 DIAGNOSIS — R05 Cough: Secondary | ICD-10-CM | POA: Diagnosis not present

## 2016-02-29 MED ORDER — HYDROCODONE-HOMATROPINE 5-1.5 MG/5ML PO SYRP: 5.0000 mL | ORAL_SOLUTION | Freq: Three times a day (TID) | ORAL | 0 refills | Status: DC | PRN

## 2016-02-29 MED ORDER — BENZONATATE 100 MG PO CAPS: 100.0000 mg | ORAL_CAPSULE | Freq: Three times a day (TID) | ORAL | 0 refills | Status: DC | PRN

## 2016-02-29 MED ORDER — DOXYCYCLINE HYCLATE 100 MG PO CAPS: 100.0000 mg | ORAL_CAPSULE | Freq: Two times a day (BID) | ORAL | 0 refills | Status: DC

## 2016-02-29 NOTE — Patient Instructions (Signed)
I am sorry that you are not feeling well- let me know if you are not better soon Use the tessalon perles for cough during the day and the syrup at night- the syrup can make you feel drowsy!  I do think that your illness is likely caused by a virus- for the time being I would not use an antibiotic. However if you are not getting better over the next few days fill and use the doxycycline antibiotic- you would take this twice a day for 10 days with food and water  Let me know if you are not getting better or if you have any concerns

## 2016-02-29 NOTE — Progress Notes (Signed)
Pre visit review using our clinic review tool, if applicable. No additional management support is needed unless otherwise documented below in the visit note. 

## 2016-02-29 NOTE — Progress Notes (Signed)
[facility] at [facility] [place], [place], [place] [address] [phone]  Date:  02/29/2016   Name:  Brenda [name]   DOB:  06/27/1945   MRN:  [id number]  PCP:  Penni Homans, MD    Chief Complaint: Cough (c/o prod cough with white mucus and nasal congestion. )   History of Present Illness:  Brenda [name] is a 70 y.o. very pleasant female patient who presents with the following:  Pt of Dr. Charlett Blake with history of hyperlipidemia, DM, HTn Here today with concern of illness for about one week She has noted a cough, ST She will urinate a bit when she coughs She has tried OTC meds but they did help much The cough can be productive She has felt hot but did not check her temp No GI symptoms No sick contacts She does not use CPAP  Lab Results  Component Value Date   HGBA1C 6.5 03/27/2015   BP Readings from Last 3 Encounters:  02/29/16 125/71  11/01/15 118/78  05/09/15 (!) 153/85      Patient Active Problem List   Diagnosis Date Noted  . Numerous moles 05/09/2015  . Muscle spasm 12/25/2014  . Medicare annual wellness visit, subsequent 12/25/2014  . UARS (upper airway resistance syndrome) 11/28/2014  . GERD (gastroesophageal reflux disease) 11/28/2014  . Oxygen desaturation during sleep 05/24/2014  . Evaluate for OSA (obstructive sleep apnea) 02/14/2014  . Dyspnea 12/26/2013  . Cervical cancer screening 04/26/2013  . Diabetes mellitus type 2 in obese ([place]) 01/31/2013  . Urinary incontinence 08/24/2012  . Hyperlipidemia 04/26/2012  . Lumbar disc disease with radiculopathy 01/20/2012  . Carpal tunnel syndrome on right 01/20/2012  . Arthritis 11/25/2011  . Colon cancer screening 04/17/2011  . Hypertension 10/17/2010  . Obesity 10/17/2010  . Anxiety 10/17/2010    Past Medical History:  Diagnosis Date  . Depression    counseling  . Diabetes mellitus type 2 in obese ([place]) 01/31/2013  . Esophageal reflux 08/24/2012   . GERD (gastroesophageal reflux disease)   . History of chicken pox    childhood age 39  . Hyperlipidemia    2011  . Hypertension   . Medicare annual wellness visit, subsequent 12/25/2014  . Numerous moles 05/09/2015  . Obesity 10/17/2010  . Other and unspecified hyperlipidemia 04/26/2012  . Other and unspecified hyperlipidemia 08/24/2012  . Seasonal allergies   . Urinary incontinence 08/24/2012    Past Surgical History:  Procedure Laterality Date  . BLADDER REPAIR     2002  . TOTAL ABDOMINAL HYSTERECTOMY     2002    Social History  Substance Use Topics  . Smoking status: Former Smoker    Packs/day: 0.20    Years: 15.00    Types: Cigarettes  . Smokeless tobacco: Never Used     Comment: Qquit 10 years ago  . Alcohol use No    Family History  Problem Relation Age of Onset  . Colon cancer Sister 54  . Breast cancer Sister 15  . Heart disease Father 69  . Hypertension Father   . Dementia Mother   . Cancer Paternal Grandfather     bone    Allergies  Allergen Reactions  . Mobic [Meloxicam] Nausea Only    Medication list has been reviewed and updated.  Current Outpatient Prescriptions on File Prior to Visit  Medication Sig Dispense Refill  . acetaminophen (TYLENOL ARTHRITIS PAIN) 650 MG CR tablet Take 1,300 mg  by mouth daily.    Marland Kitchen aspirin 81 MG tablet Take 81 mg by mouth daily.    . cyclobenzaprine (FLEXERIL) 10 MG tablet TAKE 1 TABLET (10 MG TOTAL) BY MOUTH AT BEDTIME AS NEEDED FOR MUSCLE SPASMS. 90 tablet 0  . escitalopram (LEXAPRO) 20 MG tablet TAKE 1 TABLET (20 MG TOTAL) BY MOUTH DAILY. 90 tablet 2  . losartan (COZAAR) 50 MG tablet TAKE 1 TABLET TWICE DAILY. 180 tablet 2  . pravastatin (PRAVACHOL) 20 MG tablet TAKE 1 TABLET (20 MG TOTAL) BY MOUTH DAILY. 90 tablet 2  . Probiotic Product (PROBIOTIC DAILY) CAPS Take 1 capsule by mouth daily.    . ranitidine (ZANTAC) 300 MG tablet TAKE 1 TABLET (300 MG) BY MOUTH AT BEDTIME AS NEEDED FOR HEARTBURN. 90 tablet 2  .  triamterene-hydrochlorothiazide (MAXZIDE-25) 37.5-25 MG tablet TAKE 1 TABLET EVERY DAY 90 tablet 2  . TURMERIC PO Take 1 capsule by mouth daily.    Marland Kitchen zoster vaccine live, PF, (ZOSTAVAX) 28413 UNT/0.65ML injection Inject 19,400 Units into the skin once. 1 each 0   No current facility-administered medications on file prior to visit.     Review of Systems:  As per HPI- otherwise negative.   Physical Examination: Vitals:   02/29/16 1816  BP: 125/71  Pulse: 90   Vitals:   02/29/16 1816  Weight: 218 lb 3.2 oz (99 kg)  Height: 5\' 8"  (1.727 m)   Body mass index is 33.18 kg/m. Ideal Body Weight: Weight in (lb) to have BMI = 25: 164.1  GEN: WDWN, NAD, Non-toxic, A & O x 3, obese, looks well, coughing occasionally  HEENT: Atraumatic, Normocephalic. Neck supple. No masses, No LAD.  Bilateral TM wnl, oropharynx normal.  PEERL,EOMI.   Ears and Nose: No external deformity. CV: RRR, No M/G/R. No JVD. No thrill. No extra heart sounds. PULM: CTA B, no wheezes, crackles, rhonchi. No retractions. No resp. distress. No accessory muscle use. EXTR: No c/c/e NEURO Normal gait.  PSYCH: Normally interactive. Conversant. Not depressed or anxious appearing.  Calm demeanor.    Assessment and Plan: Cough - Plan: benzonatate (TESSALON) 100 MG capsule, HYDROcodone-homatropine (HYCODAN) 5-1.5 MG/5ML syrup, doxycycline (VIBRAMYCIN) 100 MG capsule  Here today with cough for one week.  Suspect viral URI- her main issue is forceful cough that wakes her from sleep and can cause urinary leakage Tessalon, hycodan for night If not improved soon may fill and use doxycycline See patient instructions for more details.     Signed Lamar Blinks, MD

## 2016-03-04 HISTORY — PX: COLONOSCOPY: SHX174

## 2016-03-14 ENCOUNTER — Encounter: Payer: Self-pay | Admitting: Internal Medicine

## 2016-03-18 ENCOUNTER — Telehealth: Payer: Self-pay | Admitting: *Deleted

## 2016-03-18 NOTE — Telephone Encounter (Signed)
Called patient and left message to return call to schedule AWV w/ [facility]. 

## 2016-03-19 ENCOUNTER — Encounter: Payer: Self-pay | Admitting: Family Medicine

## 2016-03-19 ENCOUNTER — Ambulatory Visit (INDEPENDENT_AMBULATORY_CARE_PROVIDER_SITE_OTHER): Payer: Commercial Managed Care - HMO | Admitting: Family Medicine

## 2016-03-19 VITALS — BP 120/84 | HR 86 | Temp 98.4°F | Ht 68.0 in | Wt 217.6 lb

## 2016-03-19 DIAGNOSIS — E1169 Type 2 diabetes mellitus with other specified complication: Secondary | ICD-10-CM | POA: Diagnosis not present

## 2016-03-19 DIAGNOSIS — M25552 Pain in left hip: Secondary | ICD-10-CM | POA: Diagnosis not present

## 2016-03-19 DIAGNOSIS — Z0000 Encounter for general adult medical examination without abnormal findings: Secondary | ICD-10-CM

## 2016-03-19 DIAGNOSIS — M8580 Other specified disorders of bone density and structure, unspecified site: Secondary | ICD-10-CM

## 2016-03-19 DIAGNOSIS — E782 Mixed hyperlipidemia: Secondary | ICD-10-CM

## 2016-03-19 DIAGNOSIS — E669 Obesity, unspecified: Secondary | ICD-10-CM

## 2016-03-19 DIAGNOSIS — B353 Tinea pedis: Secondary | ICD-10-CM

## 2016-03-19 DIAGNOSIS — I10 Essential (primary) hypertension: Secondary | ICD-10-CM

## 2016-03-19 HISTORY — DX: Other specified disorders of bone density and structure, unspecified site: M85.80

## 2016-03-19 HISTORY — DX: Pain in left hip: M25.552

## 2016-03-19 MED ORDER — TIZANIDINE HCL 2 MG PO TABS: 4.0000 mg | ORAL_TABLET | Freq: Three times a day (TID) | ORAL | Status: DC | PRN

## 2016-03-19 NOTE — Patient Instructions (Addendum)
Encouraged Patient to give the Office a copy of Healthcare Power of [place] paperwork. Encourages Patient to incorporate 1200-1,500 mg of diary into her diet daily. As well as 2,000   I.U. Of Vitamin D daily. Encourages Patient to use Lamisil to use on her feet. Preventive Care 71 Years and Older, Female Preventive care refers to lifestyle choices and visits with your health care provider that can promote health and wellness. What does preventive care include?  A yearly physical exam. This is also called an annual well check.  Dental exams once or twice a year.  Routine eye exams. Ask your health care provider how often you should have your eyes checked.  Personal lifestyle choices, including:  Daily care of your teeth and gums.  Regular physical activity.  Eating a healthy diet.  Avoiding tobacco and drug use.  Limiting alcohol use.  Practicing safe sex.  Taking low-dose aspirin every day.  Taking vitamin and mineral supplements as recommended by your health care provider. What happens during an annual well check? The services and screenings done by your health care provider during your annual well check will depend on your age, overall health, lifestyle risk factors, and family history of disease. Counseling  Your health care provider may ask you questions about your:  Alcohol use.  Tobacco use.  Drug use.  Emotional well-being.  Home and relationship well-being.  Sexual activity.  Eating habits.  History of falls.  Memory and ability to understand (cognition).  Work and work Statistician.  Reproductive health. Screening  You may have the following tests or measurements:  Height, weight, and BMI.  Blood pressure.  Lipid and cholesterol levels. These may be checked every 5 years, or more frequently if you are over 71 years old.  Skin check.  Lung cancer screening. You may have this screening every year starting at age 71 if you have a 30-pack-year  history of smoking and currently smoke or have quit within the past 15 years.  Fecal occult blood test (FOBT) of the stool. You may have this test every year starting at age 71.  Flexible sigmoidoscopy or colonoscopy. You may have a sigmoidoscopy every 5 years or a colonoscopy every 10 years starting at age 71.  Hepatitis C blood test.  Hepatitis B blood test.  Sexually transmitted disease (STD) testing.  Diabetes screening. This is done by checking your blood sugar (glucose) after you have not eaten for a while (fasting). You may have this done every 1-3 years.  Bone density scan. This is done to screen for osteoporosis. You may have this done starting at age 71.  Mammogram. This may be done every 1-2 years. Talk to your health care provider about how often you should have regular mammograms. Talk with your health care provider about your test results, treatment options, and if necessary, the need for more tests. Vaccines  Your health care provider may recommend certain vaccines, such as:  Influenza vaccine. This is recommended every year.  Tetanus, diphtheria, and acellular pertussis (Tdap, Td) vaccine. You may need a Td booster every 10 years.  Varicella vaccine. You may need this if you have not been vaccinated.  Zoster vaccine. You may need this after age 71.  Measles, mumps, and rubella (MMR) vaccine. You may need at least one dose of MMR if you were born in 1957 or later. You may also need a second dose.  Pneumococcal 13-valent conjugate (PCV13) vaccine. One dose is recommended after age 71.  Pneumococcal polysaccharide (PPSV23)  vaccine. One dose is recommended after age 71.  Meningococcal vaccine. You may need this if you have certain conditions.  Hepatitis A vaccine. You may need this if you have certain conditions or if you travel or work in places where you may be exposed to hepatitis A.  Hepatitis B vaccine. You may need this if you have certain conditions or if you  travel or work in places where you may be exposed to hepatitis B.  Haemophilus influenzae type b (Hib) vaccine. You may need this if you have certain conditions. Talk to your health care provider about which screenings and vaccines you need and how often you need them. This information is not intended to replace advice given to you by your health care provider. Make sure you discuss any questions you have with your health care provider. Document Released: 03/17/2015 Document Revised: 11/08/2015 Document Reviewed: 12/20/2014 Elsevier Interactive Patient Education  2017 [employer].

## 2016-03-19 NOTE — Assessment & Plan Note (Signed)
Well controlled, no changes to meds. Encouraged heart healthy diet such as the DASH diet and exercise as tolerated.  °

## 2016-03-19 NOTE — Assessment & Plan Note (Signed)
Bone density shows osteopenia, which is thinner than normal but not as bad as osteoporosis. Recommend calcium intake of 1200 to 1500 mg daily, divided into roughly 3 doses. Best source is the diet and a single dairy serving is about 500 mg, a supplement of calcium citrate once or twice daily to balance diet is fine if not getting enough in diet. Also need Vitamin D 2000 IU caps, 1 cap daily if not already taking vitamin D. Also recommend weight baring exercise on hips and upper body to keep bones strong taking Vitamin D 2000 IU daily

## 2016-03-19 NOTE — Progress Notes (Signed)
Subjective:    Patient ID: Brenda [name], female    DOB: 1945/10/18, 71 y.o.   MRN: [id number]  Chief Complaint  Patient presents with  . Annual Exam    HPI Patient is in today for an annual examination. Patient states that she had a fall on 02/26/2016; injuring her tailbone and right knee she is currently noting left hip pain. no incontinence or radicular symptoms.. Patient also has a complaint of the bottom of her feet being dry, as well as the inside of her ears. No additional concerns noted. She reports she is doing well at home with ADLs. She denies polyuria or polydipsia. Is frustrated with dry, flaky, itchy skin on feet and notes some trouble with dry skin with itching in ears as well. Denies CP/palp/SOB/HA/congestion/fevers/GI or GU c/o. Taking meds as prescribed  Past Medical History:  Diagnosis Date  . Depression    counseling  . Diabetes mellitus type 2 in obese ([place]) 01/31/2013  . Esophageal reflux 08/24/2012  . GERD (gastroesophageal reflux disease)   . History of chicken pox    childhood age 89  . Hyperlipidemia    2011  . Hypertension   . Left hip pain 03/19/2016  . Medicare annual wellness visit, subsequent 12/25/2014  . Numerous moles 05/09/2015  . Obesity 10/17/2010  . Osteopenia 03/19/2016  . Other and unspecified hyperlipidemia 04/26/2012  . Other and unspecified hyperlipidemia 08/24/2012  . Preventative health care 03/24/2016  . Seasonal allergies   . Tinea pedis 03/24/2016  . Urinary incontinence 08/24/2012    Past Surgical History:  Procedure Laterality Date  . BLADDER REPAIR     2002  . TOTAL ABDOMINAL HYSTERECTOMY     2002    Family History  Problem Relation Age of Onset  . Colon cancer Sister 37  . Breast cancer Sister 27  . Heart disease Father 77  . Hypertension Father   . Dementia Mother   . Cancer Paternal Grandfather     bone    Social History   Social History  . Marital status: Divorced    Spouse name: N/A  . Number of children: N/A    . Years of education: N/A   Occupational History  . Not on file.   Social History Main Topics  . Smoking status: Former Smoker    Packs/day: 0.20    Years: 15.00    Types: Cigarettes  . Smokeless tobacco: Never Used     Comment: Qquit 10 years ago  . Alcohol use No  . Drug use: No  . Sexual activity: Not on file   Other Topics Concern  . Not on file   Social History Narrative  . No narrative on file    Outpatient Medications Prior to Visit  Medication Sig Dispense Refill  . acetaminophen (TYLENOL ARTHRITIS PAIN) 650 MG CR tablet Take 1,300 mg by mouth daily.    Marland Kitchen aspirin 81 MG tablet Take 81 mg by mouth daily.    . benzonatate (TESSALON) 100 MG capsule Take 1 capsule (100 mg total) by mouth 3 (three) times daily as needed for cough. 40 capsule 0  . doxycycline (VIBRAMYCIN) 100 MG capsule Take 1 capsule (100 mg total) by mouth 2 (two) times daily. 20 capsule 0  . escitalopram (LEXAPRO) 20 MG tablet TAKE 1 TABLET (20 MG TOTAL) BY MOUTH DAILY. 90 tablet 2  . HYDROcodone-homatropine (HYCODAN) 5-1.5 MG/5ML syrup Take 5 mLs by mouth every 8 (eight) hours as needed for cough. 90 mL 0  .  losartan (COZAAR) 50 MG tablet TAKE 1 TABLET TWICE DAILY. 180 tablet 2  . pravastatin (PRAVACHOL) 20 MG tablet TAKE 1 TABLET (20 MG TOTAL) BY MOUTH DAILY. 90 tablet 2  . Probiotic Product (PROBIOTIC DAILY) CAPS Take 1 capsule by mouth daily.    . ranitidine (ZANTAC) 300 MG tablet TAKE 1 TABLET (300 MG) BY MOUTH AT BEDTIME AS NEEDED FOR HEARTBURN. 90 tablet 2  . triamterene-hydrochlorothiazide (MAXZIDE-25) 37.5-25 MG tablet TAKE 1 TABLET EVERY DAY 90 tablet 2  . TURMERIC PO Take 1 capsule by mouth daily.    . cyclobenzaprine (FLEXERIL) 10 MG tablet TAKE 1 TABLET (10 MG TOTAL) BY MOUTH AT BEDTIME AS NEEDED FOR MUSCLE SPASMS. (Patient not taking: Reported on 03/19/2016) 90 tablet 0   No facility-administered medications prior to visit.     Allergies  Allergen Reactions  . Mobic [Meloxicam] Nausea  Only    Review of Systems  Constitutional: Negative for fever and malaise/fatigue.  HENT: Negative for congestion.   Eyes: Negative for blurred vision.  Respiratory: Negative for cough and shortness of breath.   Cardiovascular: Negative for chest pain, palpitations and leg swelling.  Gastrointestinal: Negative for vomiting.  Genitourinary: Negative for frequency.  Musculoskeletal: Positive for falls. Negative for back pain.       Fell on 02/26/2016. Injured tailbone and right knee.  Skin: Positive for itching and rash.       Itching inside of both ears. And feeling of dryness of both feet.  Neurological: Negative for loss of consciousness and headaches.       Objective:    Physical Exam  Constitutional: She is oriented to person, place, and time. She appears well-developed and well-nourished. No distress.  HENT:  Head: Normocephalic and atraumatic.  Eyes: Conjunctivae are normal. Right eye exhibits no discharge. Left eye exhibits no discharge.  Neck: Normal range of motion. No thyromegaly present.  Cardiovascular: Normal rate and regular rhythm.   Pulmonary/Chest: Effort normal and breath sounds normal. She has no wheezes.  Abdominal: Soft. Bowel sounds are normal. There is no tenderness. There is no rebound and no guarding.  Musculoskeletal: She exhibits no edema or deformity.  Neurological: She is alert and oriented to person, place, and time.  Skin: Skin is warm and dry. She is not diaphoretic.  Dry, flaky skin on b/l feet.  Psychiatric: She has a normal mood and affect.    BP 120/84 (BP Location: Left Arm, Patient Position: Sitting, Cuff Size: Large)   Pulse 86   Temp 98.4 F (36.9 C) (Oral)   Ht 5\' 8"  (1.727 m)   Wt 217 lb 9.6 oz (98.7 kg)   SpO2 97% Comment: RA  BMI 33.09 kg/m  Wt Readings from Last 3 Encounters:  03/19/16 217 lb 9.6 oz (98.7 kg)  02/29/16 218 lb 3.2 oz (99 kg)  11/01/15 212 lb 1.9 oz (96.2 kg)     Lab Results  Component Value Date   WBC  6.1 03/27/2015   HGB 15.8 (H) 03/27/2015   HCT 47.8 (H) 03/27/2015   PLT 366.0 03/27/2015   GLUCOSE 89 03/27/2015   CHOL 203 (H) 03/27/2015   TRIG 185.0 (H) 03/27/2015   HDL 52.80 03/27/2015   LDLCALC 113 (H) 03/27/2015   ALT 13 03/27/2015   AST 14 03/27/2015   NA 139 03/27/2015   K 4.0 03/27/2015   CL 105 03/27/2015   CREATININE 0.80 03/27/2015   BUN 20 03/27/2015   CO2 23 03/27/2015   TSH 2.05 03/27/2015  HGBA1C 6.5 03/27/2015   MICROALBUR 2.3 (H) 03/27/2015    Lab Results  Component Value Date   TSH 2.05 03/27/2015   Lab Results  Component Value Date   WBC 6.1 03/27/2015   HGB 15.8 (H) 03/27/2015   HCT 47.8 (H) 03/27/2015   MCV 89.7 03/27/2015   PLT 366.0 03/27/2015   Lab Results  Component Value Date   NA 139 03/27/2015   K 4.0 03/27/2015   CO2 23 03/27/2015   GLUCOSE 89 03/27/2015   BUN 20 03/27/2015   CREATININE 0.80 03/27/2015   BILITOT 0.3 03/27/2015   ALKPHOS 84 03/27/2015   AST 14 03/27/2015   ALT 13 03/27/2015   PROT 7.5 03/27/2015   ALBUMIN 4.3 03/27/2015   CALCIUM 9.6 03/27/2015   GFR 91.35 03/27/2015   Lab Results  Component Value Date   CHOL 203 (H) 03/27/2015   Lab Results  Component Value Date   HDL 52.80 03/27/2015   Lab Results  Component Value Date   LDLCALC 113 (H) 03/27/2015   Lab Results  Component Value Date   TRIG 185.0 (H) 03/27/2015   Lab Results  Component Value Date   CHOLHDL 4 03/27/2015   Lab Results  Component Value Date   HGBA1C 6.5 03/27/2015      I acted as a Education administrator for Dr. Charlett Blake. Raiford Noble, RMA  Assessment & Plan:   Problem List Items Addressed This Visit    Hypertension    Well controlled, no changes to meds. Encouraged heart healthy diet such as the DASH diet and exercise as tolerated.       Relevant Orders   CBC   Comprehensive metabolic panel   TSH   Hyperlipidemia    Tolerating statin, encouraged heart healthy diet, avoid trans fats, minimize simple carbs and saturated fats. Increase  exercise as tolerated      Relevant Orders   Lipid panel   Diabetes mellitus type 2 in obese ([facility])    hgba1c acceptable, minimize simple carbs. Increase exercise as tolerated. Continue current meds. Lowest in past 2 weeks was 130 and highest roughly 150.      Relevant Orders   Hemoglobin A1c   Left hip pain    Worse since a fall on 02/26/2016. Pain is bad enough it disrupts her sleep. Is set up for xray, referred to Sports med and switch from Flexeril to tizanidine prn      Relevant Medications   tiZANidine (ZANAFLEX) tablet 4 mg   Other Relevant Orders   DG Pelvis 1-2 Views   Ambulatory referral to Sports Medicine   Osteopenia    Bone density shows osteopenia, which is thinner than normal but not as bad as osteoporosis. Recommend calcium intake of 1200 to 1500 mg daily, divided into roughly 3 doses. Best source is the diet and a single dairy serving is about 500 mg, a supplement of calcium citrate once or twice daily to balance diet is fine if not getting enough in diet. Also need Vitamin D 2000 IU caps, 1 cap daily if not already taking vitamin D. Also recommend weight baring exercise on hips and upper body to keep bones strong taking Vitamin D 2000 IU daily      Tinea pedis    Soak feet nightly distilled white vinegar and hot water. Apply Lamisil bid.       Preventative health care    Patient encouraged to maintain heart healthy diet, regular exercise, adequate sleep. Consider daily probiotics. Take medications as prescribed. Request  records from opthamologist.          I have discontinued Ms. Suen's cyclobenzaprine. I am also having her maintain her aspirin, PROBIOTIC DAILY, TURMERIC PO, acetaminophen, ranitidine, escitalopram, triamterene-hydrochlorothiazide, pravastatin, losartan, benzonatate, HYDROcodone-homatropine, and doxycycline. We will continue to administer tiZANidine.  Meds ordered this encounter  Medications  . tiZANidine (ZANAFLEX) tablet 4 mg    CMA served  as scribe during this visit. History, Physical and Plan performed by medical provider. Documentation and orders reviewed and attested to.  Penni Homans, MD

## 2016-03-19 NOTE — Progress Notes (Signed)
Patient ID: Brenda [name], female   DOB: 19-Nov-1945, 70 y.o.   MRN: [id number]

## 2016-03-19 NOTE — Assessment & Plan Note (Signed)
Worse since a fall on 02/26/2016. Pain is bad enough it disrupts her sleep. Is set up for xray, referred to Sports med and switch from Flexeril to tizanidine prn

## 2016-03-19 NOTE — Progress Notes (Signed)
Pre visit review using our clinic review tool, if applicable. No additional management support is needed unless otherwise documented below in the visit note. 

## 2016-03-19 NOTE — Assessment & Plan Note (Signed)
Tolerating statin, encouraged heart healthy diet, avoid trans fats, minimize simple carbs and saturated fats. Increase exercise as tolerated 

## 2016-03-19 NOTE — Assessment & Plan Note (Signed)
hgba1c acceptable, minimize simple carbs. Increase exercise as tolerated. Continue current meds. Lowest in past 2 weeks was 130 and highest roughly 150.

## 2016-03-24 ENCOUNTER — Encounter: Payer: Self-pay | Admitting: Family Medicine

## 2016-03-24 DIAGNOSIS — Z0000 Encounter for general adult medical examination without abnormal findings: Secondary | ICD-10-CM

## 2016-03-24 DIAGNOSIS — B353 Tinea pedis: Secondary | ICD-10-CM | POA: Insufficient documentation

## 2016-03-24 HISTORY — DX: Encounter for general adult medical examination without abnormal findings: Z00.00

## 2016-03-24 HISTORY — DX: Tinea pedis: B35.3

## 2016-03-24 NOTE — Assessment & Plan Note (Signed)
Soak feet nightly distilled white vinegar and hot water. Apply Lamisil bid.

## 2016-03-24 NOTE — Assessment & Plan Note (Signed)
Patient encouraged to maintain heart healthy diet, regular exercise, adequate sleep. Consider daily probiotics. Take medications as prescribed. Request records from opthamologist.

## 2016-03-25 ENCOUNTER — Telehealth: Payer: Self-pay | Admitting: Family Medicine

## 2016-03-25 ENCOUNTER — Other Ambulatory Visit: Payer: Self-pay | Admitting: Family Medicine

## 2016-03-25 DIAGNOSIS — Z800 Family history of malignant neoplasm of digestive organs: Secondary | ICD-10-CM

## 2016-03-25 DIAGNOSIS — Z86010 Personal history of colonic polyps: Secondary | ICD-10-CM

## 2016-03-25 NOTE — Telephone Encounter (Signed)
Pt called in because she says that it is time for her to have her colonoscopy. Pt says that she would like to be referred somewhere in [facility] if possible.   CB: [phone]

## 2016-03-27 DIAGNOSIS — Z800 Family history of malignant neoplasm of digestive organs: Secondary | ICD-10-CM | POA: Diagnosis not present

## 2016-03-27 DIAGNOSIS — R12 Heartburn: Secondary | ICD-10-CM | POA: Diagnosis not present

## 2016-03-27 DIAGNOSIS — Z1211 Encounter for screening for malignant neoplasm of colon: Secondary | ICD-10-CM | POA: Diagnosis not present

## 2016-03-27 LAB — HM COLONOSCOPY

## 2016-03-28 NOTE — Progress Notes (Signed)
Pre visit review using our clinic review tool, if applicable. No additional management support is needed unless otherwise documented below in the visit note. 

## 2016-03-28 NOTE — Progress Notes (Addendum)
Subjective:   Brenda [name] is a 71 y.o. female who presents for Medicare Annual (Subsequent) preventive examination.  Review of Systems:  No ROS.  Medicare Wellness Visit.  Cardiac Risk Factors include: advanced age (>19men, >76 women);diabetes mellitus;dyslipidemia;obesity (BMI >30kg/m2);sedentary lifestyle;hypertension Sleep patterns: Sleeps well 8hrs. Feels rested. Home Safety/Smoke Alarms:  Feels safe in home. Smoke alarms in place.  Living environment; residence and Firearm Safety: Lives alone in 2 story home. No guns. Seat Belt Safety/Bike Helmet: Wears seat belt.   Counseling:   Eye Exam- DR.Atkins annually. Dental- [place] as needed.  Female:   Pap-  Hysterectomy    Mammo-  Last 12/04/15: BI-RADS Category 1: Negative. Dexa scan- Last 01/10/15: Osteopenia CCS- Last 05/02/11: Polyp removed not precancerous. Repeat in 5 yrs. SCHEDULED FOR 04/18/16.    Objective:     Vitals: BP (!) 110/58 (BP Location: Left Arm, Patient Position: Sitting, Cuff Size: Large)   Pulse 89   Ht 5\' 8"  (1.727 m)   Wt 215 lb 12.8 oz (97.9 kg)   SpO2 98%   BMI 32.81 kg/m   Body mass index is 32.81 kg/m.   Tobacco History  Smoking Status  . Former Smoker  . Packs/day: 0.20  . Years: 15.00  . Types: Cigarettes  Smokeless Tobacco  . Never Used    Comment: Qquit 10 years ago     Counseling given: No   Past Medical History:  Diagnosis Date  . Depression    counseling  . Diabetes mellitus type 2 in obese ([place]) 01/31/2013  . Esophageal reflux 08/24/2012  . GERD (gastroesophageal reflux disease)   . History of chicken pox    childhood age 24  . Hyperlipidemia    2011  . Hypertension   . Left hip pain 03/19/2016  . Medicare annual wellness visit, subsequent 12/25/2014  . Numerous moles 05/09/2015  . Obesity 10/17/2010  . Osteopenia 03/19/2016  . Other and unspecified hyperlipidemia 04/26/2012  . Other and unspecified hyperlipidemia 08/24/2012  . Preventative health care 03/24/2016  .  Seasonal allergies   . Tinea pedis 03/24/2016  . Urinary incontinence 08/24/2012   Past Surgical History:  Procedure Laterality Date  . BLADDER REPAIR     2002  . TOTAL ABDOMINAL HYSTERECTOMY     2002   Family History  Problem Relation Age of Onset  . Colon cancer Sister 11  . Breast cancer Sister 56  . Heart disease Father 18  . Hypertension Father   . Dementia Mother   . Cancer Paternal Grandfather     bone   History  Sexual Activity  . Sexual activity: No    Outpatient Encounter Prescriptions as of 03/29/2016  Medication Sig  . acetaminophen (TYLENOL ARTHRITIS PAIN) 650 MG CR tablet Take 1,300 mg by mouth daily.  Marland Kitchen aspirin 81 MG tablet Take 81 mg by mouth daily.  Marland Kitchen escitalopram (LEXAPRO) 20 MG tablet TAKE 1 TABLET (20 MG TOTAL) BY MOUTH DAILY.  Marland Kitchen HYDROcodone-homatropine (HYCODAN) 5-1.5 MG/5ML syrup Take 5 mLs by mouth every 8 (eight) hours as needed for cough.  . losartan (COZAAR) 50 MG tablet TAKE 1 TABLET TWICE DAILY.  . pravastatin (PRAVACHOL) 20 MG tablet TAKE 1 TABLET (20 MG TOTAL) BY MOUTH DAILY.  . Probiotic Product (PROBIOTIC DAILY) CAPS Take 1 capsule by mouth daily.  . ranitidine (ZANTAC) 300 MG tablet TAKE 1 TABLET (300 MG) BY MOUTH AT BEDTIME AS NEEDED FOR HEARTBURN.  . triamterene-hydrochlorothiazide (MAXZIDE-25) 37.5-25 MG tablet TAKE 1 TABLET EVERY DAY  .  TURMERIC PO Take 1 capsule by mouth daily.  . [DISCONTINUED] doxycycline (VIBRAMYCIN) 100 MG capsule Take 1 capsule (100 mg total) by mouth 2 (two) times daily.  . benzonatate (TESSALON) 100 MG capsule Take 1 capsule (100 mg total) by mouth 3 (three) times daily as needed for cough. (Patient not taking: Reported on 03/29/2016)   Facility-Administered Encounter Medications as of 03/29/2016  Medication  . tiZANidine (ZANAFLEX) tablet 4 mg    Activities of Daily Living In your present state of health, do you have any difficulty performing the following activities: 03/29/2016 03/19/2016  Hearing? N N  Vision?  N N  Difficulty concentrating or making decisions? N N  Walking or climbing stairs? N N  Dressing or bathing? N N  Doing errands, shopping? N N  Preparing Food and eating ? N -  Using the Toilet? N -  In the past six months, have you accidently leaked urine? N -  Do you have problems with loss of bowel control? N -  Managing your Medications? N -  Managing your Finances? N -  Housekeeping or managing your Housekeeping? N -  Some recent data might be hidden    Patient Care Team: Mosie Lukes, MD as PCP - General (Family Medicine)    Assessment:    Physical assessment deferred to PCP.  Exercise Activities and Dietary recommendations Current Exercise Habits: Home exercise routine, Type of exercise: stretching, Time (Minutes): 60, Frequency (Times/Week): 7, Weekly Exercise (Minutes/Week): 420, Intensity: Mild   Diet (meal preparation, eat out, water intake, caffeinated beverages, dairy products, fruits and vegetables): in general, a "healthy" diet  , on average, 3 meals per day     Goals    . Weight (lb) < 200 lb (90.7 kg)          With diet and exercise      Fall Risk Fall Risk  03/29/2016 03/19/2016 12/19/2014  Falls in the past year? Yes Yes No  Number falls in past yr: 1 1 -  Injury with Fall? Yes Yes -  Follow up Education provided;Falls prevention discussed - -   Depression Screen PHQ 2/9 Scores 03/29/2016 03/19/2016 12/19/2014  PHQ - 2 Score 0 0 0     Cognitive Function MMSE - Mini Mental State Exam 03/29/2016  Orientation to time 5  Orientation to Place 5  Registration 3  Attention/ Calculation 5  Recall 3  Language- name 2 objects 2  Language- repeat 1  Language- follow 3 step command 3  Language- read & follow direction 1  Write a sentence 1  Copy design 1  Total score 30        Immunization History  Administered Date(s) Administered  . Influenza Split 01/16/2011, 11/25/2011, 01/03/2014  . Influenza Whole 12/31/2012  . Influenza,inj,Quad PF,36+  Mos 12/20/2013, 12/19/2014  . Influenza-Unspecified 11/09/2015  . Pneumococcal Conjugate-13 01/25/2013  . Pneumococcal Polysaccharide-23 12/21/2007, 12/19/2014  . Tdap 04/17/2011   Screening Tests Health Maintenance  Topic Date Due  . OPHTHALMOLOGY EXAM  10/20/1955  . ZOSTAVAX  10/19/2005  . HEMOGLOBIN A1C  09/24/2015  . FOOT EXAM  03/26/2016  . COLONOSCOPY  05/01/2016  . MAMMOGRAM  12/03/2016  . TETANUS/TDAP  04/16/2021  . INFLUENZA VACCINE  Completed  . DEXA SCAN  Completed  . Hepatitis C Screening  Completed  . PNA vac Low Risk Adult  Completed      Plan:     Follow with Dr.Blyth as scheduled 07/19/16.  Go to lab today.  Continue  to eat heart healthy diet (full of fruits, vegetables, whole grains, lean protein, water--limit salt, fat, and sugar intake) and increase physical activity as tolerated.   During the course of the visit the patient was educated and counseled about the following appropriate screening and preventive services:   Vaccines to include Pneumoccal, Influenza, Hepatitis B, Td, Zostavax, HCV  Electrocardiogram  Cardiovascular Disease  Colorectal cancer screening  Bone density screening  Diabetes screening  Glaucoma screening  Mammography/PAP  Nutrition counseling   Patient Instructions (the written plan) was given to the patient.   Shela Nevin, [place]  03/29/2016  RN AWV note reviewed. Agree with documention and plan.  Penni Homans, MD

## 2016-03-29 ENCOUNTER — Encounter: Payer: Self-pay | Admitting: *Deleted

## 2016-03-29 ENCOUNTER — Ambulatory Visit (INDEPENDENT_AMBULATORY_CARE_PROVIDER_SITE_OTHER): Payer: Commercial Managed Care - HMO | Admitting: *Deleted

## 2016-03-29 ENCOUNTER — Encounter: Payer: Self-pay | Admitting: Family Medicine

## 2016-03-29 ENCOUNTER — Ambulatory Visit (INDEPENDENT_AMBULATORY_CARE_PROVIDER_SITE_OTHER): Payer: Commercial Managed Care - HMO | Admitting: Family Medicine

## 2016-03-29 ENCOUNTER — Ambulatory Visit (HOSPITAL_BASED_OUTPATIENT_CLINIC_OR_DEPARTMENT_OTHER)
Admission: RE | Admit: 2016-03-29 | Discharge: 2016-03-29 | Disposition: A | Discharge status: Home / Self Care | Payer: Commercial Managed Care - HMO | Source: Ambulatory Visit | Attending: Family Medicine | Admitting: Family Medicine

## 2016-03-29 VITALS — BP 135/82 | HR 86 | Ht 68.0 in | Wt 218.0 lb

## 2016-03-29 VITALS — BP 110/58 | HR 89 | Ht 68.0 in | Wt 215.8 lb

## 2016-03-29 DIAGNOSIS — M545 Low back pain, unspecified: Secondary | ICD-10-CM

## 2016-03-29 DIAGNOSIS — S3210XA Unspecified fracture of sacrum, initial encounter for closed fracture: Secondary | ICD-10-CM | POA: Diagnosis not present

## 2016-03-29 DIAGNOSIS — Z0000 Encounter for general adult medical examination without abnormal findings: Secondary | ICD-10-CM | POA: Diagnosis not present

## 2016-03-29 DIAGNOSIS — S3992XA Unspecified injury of lower back, initial encounter: Secondary | ICD-10-CM

## 2016-03-29 DIAGNOSIS — X58XXXA Exposure to other specified factors, initial encounter: Secondary | ICD-10-CM | POA: Insufficient documentation

## 2016-03-29 DIAGNOSIS — S3993XA Unspecified injury of pelvis, initial encounter: Secondary | ICD-10-CM | POA: Diagnosis not present

## 2016-03-29 DIAGNOSIS — S3219XD Other fracture of sacrum, subsequent encounter for fracture with routine healing: Secondary | ICD-10-CM | POA: Diagnosis not present

## 2016-03-29 LAB — COMPREHENSIVE METABOLIC PANEL
ALK PHOS: 73 U/L (ref 39–117)
ALT: 11 U/L (ref 0–35)
AST: 12 U/L (ref 0–37)
Albumin: 4.2 g/dL (ref 3.5–5.2)
BILIRUBIN TOTAL: 0.4 mg/dL (ref 0.2–1.2)
BUN: 22 mg/dL (ref 6–23)
CALCIUM: 9.4 mg/dL (ref 8.4–10.5)
CO2: 23 meq/L (ref 19–32)
Chloride: 103 mEq/L (ref 96–112)
Creatinine, Ser: 1.15 mg/dL (ref 0.40–1.20)
GFR: 59.92 mL/min — AB (ref >60.00)
Glucose, Bld: 164 mg/dL — ABNORMAL HIGH (ref 70–99)
POTASSIUM: 3.7 meq/L (ref 3.5–5.1)
Sodium: 138 mEq/L (ref 135–145)
TOTAL PROTEIN: 7.6 g/dL (ref 6.0–8.3)

## 2016-03-29 LAB — TSH: TSH: 0.98 u[IU]/mL (ref 0.35–4.50)

## 2016-03-29 LAB — CBC
HEMATOCRIT: 43.8 % (ref 36.0–46.0)
HEMOGLOBIN: 14.9 g/dL (ref 12.0–15.0)
MCHC: 33.9 g/dL (ref 30.0–36.0)
MCV: 87.5 fl (ref 78.0–100.0)
PLATELETS: 342 10*3/uL (ref 150.0–400.0)
RBC: 5.01 Mil/uL (ref 3.87–5.11)
RDW: 14.5 % (ref 11.5–15.5)
WBC: 5.7 10*3/uL (ref 4.0–10.5)

## 2016-03-29 LAB — LIPID PANEL
Cholesterol: 181 mg/dL (ref 0–200)
HDL: 47.3 mg/dL (ref >39.00)
LDL Cholesterol: 110 mg/dL — ABNORMAL HIGH (ref 0–99)
NonHDL: 133.28
TRIGLYCERIDES: 114 mg/dL (ref 0.0–149.0)
Total CHOL/HDL Ratio: 4
VLDL: 22.8 mg/dL (ref 0.0–40.0)

## 2016-03-29 LAB — HEMOGLOBIN A1C: Hgb A1c MFr Bld: 6.7 % — ABNORMAL HIGH (ref 4.6–6.5)

## 2016-03-29 NOTE — Patient Instructions (Addendum)
Follow with Dr.Blyth as scheduled 07/19/16.  Go to lab today.  Continue to eat heart healthy diet (full of fruits, vegetables, whole grains, lean protein, water--limit salt, fat, and sugar intake) and increase physical activity as tolerated.   Screening for Type 2 Diabetes A screening test for type 2 diabetes (type 2 diabetes mellitus) is a blood test to measure your blood sugar (glucose) level. This test is done to check for early signs of diabetes, before you develop symptoms. Type 2 diabetes is a long-term (chronic) disease that occurs when the pancreas does not make enough of a hormone called insulin. This results in high blood glucose levels, which can cause many complications. You may be screened for type 2 diabetes as part of your regular health care, especially if you have a high risk for diabetes. Screening can help identify type 2 diabetes at its early stage (prediabetes). Identifying and treating prediabetes may delay or prevent development of type 2 diabetes. What are the risk factors for type 2 diabetes? The following factors may make you more likely to develop type 2 diabetes:  Having a parent or sibling (first-degree relative) who has diabetes.  Being overweight or obese.  Being of American-Indian, [place], Hispanic, Latino, Asian, or African-American descent.  Not getting enough exercise.  Being older than 77.  Having a history of diabetes during pregnancy (gestational diabetes).  Having low levels of good cholesterol (HDL-C) or high levels of blood fats (triglycerides).  Having high blood glucose in a previous blood test.  Having high blood pressure.  Having certain diseases or conditions, including:  Acanthosis nigricans. This is a condition that causes dark skin on the neck, armpits, and groin.  Polycystic ovary syndrome (PCOS).  Heart disease.  Having delivered a baby who weighed more than 9 lb (4.1 kg). Who should be screened for type 2  diabetes? Adults  Adults age 33 and older. These adults should be screened at least once every three years.  Adults who are younger than 3, overweight, and have at least one other risk factor. These adults should be screened at least once every three years.  Adults who have normal blood glucose levels and two or more risk factors. These adults may be screened once every year (annually).  Women who have had gestational diabetes in the past. These women should be screened at least once every three years.  Pregnant women who have risk factors. These women should be screened at their first prenatal visit.  Pregnant women with no risk factors. These women should be screened between weeks 24 and 28 of pregnancy. Children and adolescents  Children and adolescents should be screened for type 2 diabetes if they are overweight and have 2 of the following risk factors:  A family history of type 2 diabetes.  Being a member of a high risk race or ethnic group.  Signs of insulin resistance or conditions associated with insulin resistance.  A mother who had gestational diabetes while pregnant with him or her.  Screening should be done at least once every three years, starting at age 24. Your health care provider or your child's health care provider may recommend having a screening more or less often. What happens during screening? During screening, your health care provider may ask questions about:  Your health and your risk factors, including your activity level and any medical conditions that you have.  The health of your first-degree relatives.  Past pregnancies, if this applies. Your health care provider will also  do a physical exam, including a blood pressure measurement and blood tests. There are four blood tests that can be used to screen for type 2 diabetes. You may have one or more of the following:  A fasting plasma glucose test (FBG). You will not be allowed to eat for at least  eight hours before a blood sample is taken.  A random blood glucose test. This test checks your blood glucose at any time of the day regardless of when you ate.  An oral glucose tolerance test (OGTT). This test measures your blood glucose at two times:  After you have not eaten (have fasted) overnight.  Two hours after you drink a glucose-containing beverage. A diagnosis can be made if the level is greater than 200 mg/dL (11.1 mmol/L).  An A1c test. This test provides information about blood glucose control over the previous three months. What do the results mean? Your test results are a measurement of how much glucose is in your blood. Normal blood glucose levels mean that you do not have diabetes or prediabetes. High blood glucose levels may mean that you have prediabetes or diabetes. Depending on the results, other tests may be needed to confirm the diagnosis. This information is not intended to replace advice given to you by your health care provider. Make sure you discuss any questions you have with your health care provider. Document Released: 12/15/2008 Document Revised: 07/27/2015 Document Reviewed: 12/16/2014 Elsevier Interactive Patient Education  2017 [employer], Female Introduction Adopting a healthy lifestyle and getting preventive care can go a long way to promote health and wellness. Talk with your health care provider about what schedule of regular examinations is right for you. This is a good chance for you to check in with your provider about disease prevention and staying healthy. In between checkups, there are plenty of things you can do on your own. Experts have done a lot of research about which lifestyle changes and preventive measures are most likely to keep you healthy. Ask your health care provider for more information. Weight and diet Eat a healthy diet  Be sure to include plenty of vegetables, fruits, low-fat dairy products, and lean  protein.  Do not eat a lot of foods high in solid fats, added sugars, or salt.  Get regular exercise. This is one of the most important things you can do for your health.  Most adults should exercise for at least 150 minutes each week. The exercise should increase your heart rate and make you sweat (moderate-intensity exercise).  Most adults should also do strengthening exercises at least twice a week. This is in addition to the moderate-intensity exercise. Maintain a healthy weight  Body mass index (BMI) is a measurement that can be used to identify possible weight problems. It estimates body fat based on height and weight. Your health care provider can help determine your BMI and help you achieve or maintain a healthy weight.  For females 17 years of age and older:  A BMI below 18.5 is considered underweight.  A BMI of 18.5 to 24.9 is normal.  A BMI of 25 to 29.9 is considered overweight.  A BMI of 30 and above is considered obese. Watch levels of cholesterol and blood lipids  You should start having your blood tested for lipids and cholesterol at 71 years of age, then have this test every 5 years.  You may need to have your cholesterol levels checked more often if:  Your lipid or  cholesterol levels are high.  You are older than 71 years of age.  You are at high risk for heart disease. Cancer screening Lung Cancer  Lung cancer screening is recommended for adults 95-79 years old who are at high risk for lung cancer because of a history of smoking.  A yearly low-dose CT scan of the lungs is recommended for people who:  Currently smoke.  Have quit within the past 15 years.  Have at least a 30-pack-year history of smoking. A pack year is smoking an average of one pack of cigarettes a day for 1 year.  Yearly screening should continue until it has been 15 years since you quit.  Yearly screening should stop if you develop a health problem that would prevent you from having  lung cancer treatment. Breast Cancer  Practice breast self-awareness. This means understanding how your breasts normally appear and feel.  It also means doing regular breast self-exams. Let your health care provider know about any changes, no matter how small.  If you are in your 20s or 30s, you should have a clinical breast exam (CBE) by a health care provider every 1-3 years as part of a regular health exam.  If you are 13 or older, have a CBE every year. Also consider having a breast X-ray (mammogram) every year.  If you have a family history of breast cancer, talk to your health care provider about genetic screening.  If you are at high risk for breast cancer, talk to your health care provider about having an MRI and a mammogram every year.  Breast cancer gene (BRCA) assessment is recommended for women who have family members with BRCA-related cancers. BRCA-related cancers include:  Breast.  Ovarian.  Tubal.  Peritoneal cancers.  Results of the assessment will determine the need for genetic counseling and BRCA1 and BRCA2 testing. Cervical Cancer  Your health care provider may recommend that you be screened regularly for cancer of the pelvic organs (ovaries, uterus, and vagina). This screening involves a pelvic examination, including checking for microscopic changes to the surface of your cervix (Pap test). You may be encouraged to have this screening done every 3 years, beginning at age 74.  For women ages 74-65, health care providers may recommend pelvic exams and Pap testing every 3 years, or they may recommend the Pap and pelvic exam, combined with testing for human papilloma virus (HPV), every 5 years. Some types of HPV increase your risk of cervical cancer. Testing for HPV may also be done on women of any age with unclear Pap test results.  Other health care providers may not recommend any screening for nonpregnant women who are considered low risk for pelvic cancer and who do  not have symptoms. Ask your health care provider if a screening pelvic exam is right for you.  If you have had past treatment for cervical cancer or a condition that could lead to cancer, you need Pap tests and screening for cancer for at least 20 years after your treatment. If Pap tests have been discontinued, your risk factors (such as having a new sexual partner) need to be reassessed to determine if screening should resume. Some women have medical problems that increase the chance of getting cervical cancer. In these cases, your health care provider may recommend more frequent screening and Pap tests. Colorectal Cancer  This type of cancer can be detected and often prevented.  Routine colorectal cancer screening usually begins at 71 years of age and continues through 71  years of age.  Your health care provider may recommend screening at an earlier age if you have risk factors for colon cancer.  Your health care provider may also recommend using home test kits to check for hidden blood in the stool.  A small camera at the end of a tube can be used to examine your colon directly (sigmoidoscopy or colonoscopy). This is done to check for the earliest forms of colorectal cancer.  Routine screening usually begins at age 63.  Direct examination of the colon should be repeated every 5-10 years through 71 years of age. However, you may need to be screened more often if early forms of precancerous polyps or small growths are found. Skin Cancer  Check your skin from head to toe regularly.  Tell your health care provider about any new moles or changes in moles, especially if there is a change in a mole's shape or color.  Also tell your health care provider if you have a mole that is larger than the size of a pencil eraser.  Always use sunscreen. Apply sunscreen liberally and repeatedly throughout the day.  Protect yourself by wearing long sleeves, pants, a wide-brimmed hat, and sunglasses  whenever you are outside. Heart disease, diabetes, and high blood pressure  High blood pressure causes heart disease and increases the risk of stroke. High blood pressure is more likely to develop in:  People who have blood pressure in the high end of the normal range (130-139/85-89 mm Hg).  People who are overweight or obese.  People who are African American.  If you are 18-46 years of age, have your blood pressure checked every 3-5 years. If you are 65 years of age or older, have your blood pressure checked every year. You should have your blood pressure measured twice-once when you are at a hospital or clinic, and once when you are not at a hospital or clinic. Record the average of the two measurements. To check your blood pressure when you are not at a hospital or clinic, you can use:  An automated blood pressure machine at a pharmacy.  A home blood pressure monitor.  If you are between 71 years and 58 years old, ask your health care provider if you should take aspirin to prevent strokes.  Have regular diabetes screenings. This involves taking a blood sample to check your fasting blood sugar level.  If you are at a normal weight and have a low risk for diabetes, have this test once every three years after 71 years of age.  If you are overweight and have a high risk for diabetes, consider being tested at a younger age or more often. Preventing infection Hepatitis B  If you have a higher risk for hepatitis B, you should be screened for this virus. You are considered at high risk for hepatitis B if:  You were born in a country where hepatitis B is common. Ask your health care provider which countries are considered high risk.  Your parents were born in a high-risk country, and you have not been immunized against hepatitis B (hepatitis B vaccine).  You have HIV or AIDS.  You use needles to inject street drugs.  You live with someone who has hepatitis B.  You have had sex with  someone who has hepatitis B.  You get hemodialysis treatment.  You take certain medicines for conditions, including cancer, organ transplantation, and autoimmune conditions. Hepatitis C  Blood testing is recommended for:  Everyone born from [birth year]  through 1965.  Anyone with known risk factors for hepatitis C. Sexually transmitted infections (STIs)  You should be screened for sexually transmitted infections (STIs) including gonorrhea and chlamydia if:  You are sexually active and are younger than 71 years of age.  You are older than 71 years of age and your health care provider tells you that you are at risk for this type of infection.  Your sexual activity has changed since you were last screened and you are at an increased risk for chlamydia or gonorrhea. Ask your health care provider if you are at risk.  If you do not have HIV, but are at risk, it may be recommended that you take a prescription medicine daily to prevent HIV infection. This is called pre-exposure prophylaxis (PrEP). You are considered at risk if:  You are sexually active and do not regularly use condoms or know the HIV status of your partner(s).  You take drugs by injection.  You are sexually active with a partner who has HIV. Talk with your health care provider about whether you are at high risk of being infected with HIV. If you choose to begin PrEP, you should first be tested for HIV. You should then be tested every 3 months for as long as you are taking PrEP. Pregnancy  If you are premenopausal and you may become pregnant, ask your health care provider about preconception counseling.  If you may become pregnant, take 400 to 800 micrograms (mcg) of folic acid every day.  If you want to prevent pregnancy, talk to your health care provider about birth control (contraception). Osteoporosis and menopause  Osteoporosis is a disease in which the bones lose minerals and strength with aging. This can result in  serious bone fractures. Your risk for osteoporosis can be identified using a bone density scan.  If you are 22 years of age or older, or if you are at risk for osteoporosis and fractures, ask your health care provider if you should be screened.  Ask your health care provider whether you should take a calcium or vitamin D supplement to lower your risk for osteoporosis.  Menopause may have certain physical symptoms and risks.  Hormone replacement therapy may reduce some of these symptoms and risks. Talk to your health care provider about whether hormone replacement therapy is right for you. Follow these instructions at home:  Schedule regular health, dental, and eye exams.  Stay current with your immunizations.  Do not use any tobacco products including cigarettes, chewing tobacco, or electronic cigarettes.  If you are pregnant, do not drink alcohol.  If you are breastfeeding, limit how much and how often you drink alcohol.  Limit alcohol intake to no more than 1 drink per day for nonpregnant women. One drink equals 12 ounces of beer, 5 ounces of wine, or 1 ounces of hard liquor.  Do not use street drugs.  Do not share needles.  Ask your health care provider for help if you need support or information about quitting drugs.  Tell your health care provider if you often feel depressed.  Tell your health care provider if you have ever been abused or do not feel safe at home. This information is not intended to replace advice given to you by your health care provider. Make sure you discuss any questions you have with your health care provider. Document Released: 09/03/2010 Document Revised: 07/27/2015 Document Reviewed: 11/22/2014  2017 Elsevier

## 2016-03-29 NOTE — Patient Instructions (Addendum)
You have a sacral contusion and low lumbar strain (there is an area on the x-ray showing you may have a crack that has healed well to date). Pick 3-5 of the exercises in the handout and do them daily. Let me know if you want to do physical therapy. Ice/heat (whichever feels better at this point) 15 minutes at a time 3-4 times a day. Aleve 2 tabs twice a day with food OR ibuprofen 600mg  three times a day with food for pain and inflammation - only take for 7-10 days then as needed. Fill the tizanidine (zanaflex) that Dr. Charlett Blake prescribed and take this as needed for spasms. I don't think you need a steroid or a different pain medication. Follow up with me in 1 month for reevaluation.

## 2016-04-01 ENCOUNTER — Telehealth: Payer: Self-pay | Admitting: Family Medicine

## 2016-04-01 NOTE — Telephone Encounter (Signed)
Caller name: Relationship to patient: Self Can be reached: [phone]  Pharmacy:  [facility], [place] [phone] (Phone) [phone] (Fax)     Reason for call:Patient states Rx for tiZANidine (ZANAFLEX) tablet 4 mg has not been received by her pharmacy. Also wants to know what cream Provider wants her to get OTC

## 2016-04-02 DIAGNOSIS — S3992XA Unspecified injury of lower back, initial encounter: Secondary | ICD-10-CM | POA: Insufficient documentation

## 2016-04-02 HISTORY — DX: Unspecified injury of lower back, initial encounter: S39.92XA

## 2016-04-02 MED ORDER — TIZANIDINE HCL 4 MG PO CAPS: 4.0000 mg | ORAL_CAPSULE | Freq: Three times a day (TID) | ORAL | 1 refills | Status: DC | PRN

## 2016-04-02 NOTE — Telephone Encounter (Signed)
Sent in prescriptions and informed the patient of PCP OTC instructions.

## 2016-04-02 NOTE — Progress Notes (Signed)
PCP and consultation requested by: Penni Homans, MD  Subjective:   HPI: Patient is a 71 y.o. female here for pain following a fall.  Patient reports on 12/25 she was getting out of the car, tripped over a concrete divider then fell down onto hands, left side then to back. Most areas have improved but low back still painful. She has a prior history of bulging disc but this feels different. Has been using aspercreme. No numbness or tingling. No bowel/bladder dysfunction. No radiation into legs. Pain is up to 7/10, sharp, low back middle to both sides.  Past Medical History:  Diagnosis Date  . Depression    counseling  . Diabetes mellitus type 2 in obese ([place]) 01/31/2013  . Esophageal reflux 08/24/2012  . GERD (gastroesophageal reflux disease)   . History of chicken pox    childhood age 77  . Hyperlipidemia    2011  . Hypertension   . Left hip pain 03/19/2016  . Medicare annual wellness visit, subsequent 12/25/2014  . Numerous moles 05/09/2015  . Obesity 10/17/2010  . Osteopenia 03/19/2016  . Other and unspecified hyperlipidemia 04/26/2012  . Other and unspecified hyperlipidemia 08/24/2012  . Preventative health care 03/24/2016  . Seasonal allergies   . Tinea pedis 03/24/2016  . Urinary incontinence 08/24/2012    Current Outpatient Prescriptions on File Prior to Visit  Medication Sig Dispense Refill  . acetaminophen (TYLENOL ARTHRITIS PAIN) 650 MG CR tablet Take 1,300 mg by mouth daily.    Marland Kitchen aspirin 81 MG tablet Take 81 mg by mouth daily.    . benzonatate (TESSALON) 100 MG capsule Take 1 capsule (100 mg total) by mouth 3 (three) times daily as needed for cough. (Patient not taking: Reported on 03/29/2016) 40 capsule 0  . escitalopram (LEXAPRO) 20 MG tablet TAKE 1 TABLET (20 MG TOTAL) BY MOUTH DAILY. 90 tablet 2  . HYDROcodone-homatropine (HYCODAN) 5-1.5 MG/5ML syrup Take 5 mLs by mouth every 8 (eight) hours as needed for cough. 90 mL 0  . losartan (COZAAR) 50 MG tablet TAKE 1 TABLET  TWICE DAILY. 180 tablet 2  . pravastatin (PRAVACHOL) 20 MG tablet TAKE 1 TABLET (20 MG TOTAL) BY MOUTH DAILY. 90 tablet 2  . Probiotic Product (PROBIOTIC DAILY) CAPS Take 1 capsule by mouth daily.    . ranitidine (ZANTAC) 300 MG tablet TAKE 1 TABLET (300 MG) BY MOUTH AT BEDTIME AS NEEDED FOR HEARTBURN. 90 tablet 2  . triamterene-hydrochlorothiazide (MAXZIDE-25) 37.5-25 MG tablet TAKE 1 TABLET EVERY DAY 90 tablet 2  . TURMERIC PO Take 1 capsule by mouth daily.     Current Facility-Administered Medications on File Prior to Visit  Medication Dose Route Frequency Provider Last Rate Last Dose  . tiZANidine (ZANAFLEX) tablet 4 mg  4 mg Oral Q8H PRN Mosie Lukes, MD        Past Surgical History:  Procedure Laterality Date  . BLADDER REPAIR     2002  . TOTAL ABDOMINAL HYSTERECTOMY     2002    Allergies  Allergen Reactions  . Mobic [Meloxicam] Nausea Only    Social History   Social History  . Marital status: Divorced    Spouse name: N/A  . Number of children: N/A  . Years of education: N/A   Occupational History  . Not on file.   Social History Main Topics  . Smoking status: Former Smoker    Packs/day: 0.20    Years: 15.00    Types: Cigarettes  . Smokeless tobacco: Never Used  Comment: Qquit 10 years ago  . Alcohol use No  . Drug use: No  . Sexual activity: No   Other Topics Concern  . Not on file   Social History Narrative  . No narrative on file    Family History  Problem Relation Age of Onset  . Colon cancer Sister 73  . Breast cancer Sister 41  . Heart disease Father 16  . Hypertension Father   . Dementia Mother   . Cancer Paternal Grandfather     bone    BP 135/82   Pulse 86   Ht 5\' 8"  (1.727 m)   Wt 218 lb (98.9 kg)   BMI 33.15 kg/m   Review of Systems: See HPI above.     Objective:  Physical Exam:  Gen: NAD, comfortable in exam room  Back: No gross deformity, scoliosis. TTP upper sacrum, low lumbar and paraspinal  regions. FROM. Strength LEs 5/5 all muscle groups.   2+ MSRs in patellar and achilles tendons, equal bilaterally. Negative SLRs. Sensation intact to light touch bilaterally. Negative logroll bilateral hips Negative fabers and piriformis stretches.   Assessment & Plan:  1. Low back injury - independently reviewed radiographs - does appear to have healing lower sacral fracture though current pain and tenderness is higher, more consistent with bone contusion and lumbar strain.  Shown home exercises to do daily - declined PT for now.  Aleve or ibuprofen.  Ice/heat.  Fill tizanidine for spasms.  F/u in 1 month for reevaluation.

## 2016-04-02 NOTE — Telephone Encounter (Signed)
Ok to send in rx for Tizanidine 4 mg tabs, 1 tab po tid prn pain, disp #30 with 1 rf. Lidocaine topical gel to painful area as directed. Made by Jenel Lucks, [employer], [facility] all make one

## 2016-04-02 NOTE — Assessment & Plan Note (Signed)
independently reviewed radiographs - does appear to have healing lower sacral fracture though current pain and tenderness is higher, more consistent with bone contusion and lumbar strain.  Shown home exercises to do daily - declined PT for now.  Aleve or ibuprofen.  Ice/heat.  Fill tizanidine for spasms.  F/u in 1 month for reevaluation.

## 2016-04-04 NOTE — Telephone Encounter (Signed)
Do not know what her insurance will cover. She should check with pharmacist and see what her plan carries would be happy to rx Baclofen 10 mg, Cyclobenzaprine 10 mg or carisoprodal 350 mg but cannot guess prices any more. Once we know I will write

## 2016-04-04 NOTE — Telephone Encounter (Signed)
Tizanidine is too expensive  $75 and needs alternative.

## 2016-04-05 NOTE — Telephone Encounter (Signed)
Called the patient informed of PCP instructions.  Left her a detailed message which was ok with this patient to do as discussed to do so.

## 2016-04-15 NOTE — Telephone Encounter (Signed)
The patient stated the Tizanidine (TABLETS) will be cheaper through her insurance--let me know can send in if ok.

## 2016-04-15 NOTE — Telephone Encounter (Signed)
Ok to switch to tabs with same strength, same sig

## 2016-04-16 MED ORDER — TIZANIDINE HCL 4 MG PO TABS: 4.0000 mg | ORAL_TABLET | Freq: Three times a day (TID) | ORAL | 1 refills | Status: DC

## 2016-04-16 NOTE — Addendum Note (Signed)
Addended by: Sharon Seller B on: 04/16/2016 07:51 AM   Modules accepted: Orders

## 2016-04-16 NOTE — Telephone Encounter (Signed)
Sent in tablets as instructed. Called the patient informed prescription sent in (left her a message)

## 2016-04-19 DIAGNOSIS — K648 Other hemorrhoids: Secondary | ICD-10-CM | POA: Diagnosis not present

## 2016-04-19 DIAGNOSIS — K5730 Diverticulosis of large intestine without perforation or abscess without bleeding: Secondary | ICD-10-CM | POA: Diagnosis not present

## 2016-04-19 DIAGNOSIS — D125 Benign neoplasm of sigmoid colon: Secondary | ICD-10-CM | POA: Diagnosis not present

## 2016-04-19 DIAGNOSIS — Z800 Family history of malignant neoplasm of digestive organs: Secondary | ICD-10-CM | POA: Diagnosis not present

## 2016-04-19 DIAGNOSIS — K644 Residual hemorrhoidal skin tags: Secondary | ICD-10-CM | POA: Diagnosis not present

## 2016-04-19 DIAGNOSIS — D124 Benign neoplasm of descending colon: Secondary | ICD-10-CM | POA: Diagnosis not present

## 2016-04-19 DIAGNOSIS — Z1211 Encounter for screening for malignant neoplasm of colon: Secondary | ICD-10-CM | POA: Diagnosis not present

## 2016-04-22 MED ORDER — TIZANIDINE HCL 4 MG PO TABS: 4.0000 mg | ORAL_TABLET | Freq: Three times a day (TID) | ORAL | 1 refills | Status: DC

## 2016-04-22 NOTE — Telephone Encounter (Signed)
[employer] called to inform patient has to get this from them.  So they took a verbal for a #90 day supply. With 1 refills.

## 2016-04-22 NOTE — Addendum Note (Signed)
Addended by: Sharon Seller B on: 04/22/2016 11:06 AM   Modules accepted: Orders

## 2016-04-22 NOTE — Telephone Encounter (Signed)
Gave [employer] a verbal and updated her list that 90 days sent in to (phone in) [facility]

## 2016-04-29 NOTE — Telephone Encounter (Signed)
Relation to [id number] Call back number: [phone]  Reason for call:  Patient states tiZANidine (ZANAFLEX) tablet 4 mg is not working for crack pelvis requesting tramadol, please advise

## 2016-04-29 NOTE — Telephone Encounter (Signed)
I do not see where we have prescribed this med for her before. Has she taken it before. she needs to know that it can cause fatigue, somnolence, nausea, confusion, constipation and it does have addiction potential all that being said with a documented fracture which I have reviewed I am willing to prescribe a small number. She has to verbally acknowledge the risks and that she should not take it and drive. Then she can have Tramadol 50 mg tabs, 1 tab po bid prn severe pain, #20. If she needs more beyond that she will need an appt to document need and to do a contract and UDS

## 2016-04-30 MED ORDER — TRAMADOL HCL 50 MG PO TABS: 50.0000 mg | ORAL_TABLET | Freq: Two times a day (BID) | ORAL | 0 refills | Status: DC | PRN

## 2016-04-30 NOTE — Addendum Note (Signed)
Addended by: Sharon Seller B on: 04/30/2016 01:16 PM   Modules accepted: Orders

## 2016-04-30 NOTE — Telephone Encounter (Signed)
Called left detailed message to discuss PCP's response to request.

## 2016-04-30 NOTE — Telephone Encounter (Signed)
Patient called back and did inform thoroughly of all PCP instructions regarding medication. She did verbalize understanding and does want to try #20. Printed/pcp signed and then faxed hardcopy to [facility] mail order pharmacy.

## 2016-04-30 NOTE — Telephone Encounter (Signed)
Called left message to call back 

## 2016-05-27 ENCOUNTER — Encounter: Payer: Self-pay | Admitting: Family Medicine

## 2016-05-27 ENCOUNTER — Ambulatory Visit (INDEPENDENT_AMBULATORY_CARE_PROVIDER_SITE_OTHER): Payer: Medicare HMO | Admitting: Family Medicine

## 2016-05-27 VITALS — BP 137/74 | HR 86 | Temp 98.4°F | Resp 18 | Wt 217.0 lb

## 2016-05-27 DIAGNOSIS — I10 Essential (primary) hypertension: Secondary | ICD-10-CM

## 2016-05-27 DIAGNOSIS — S3210XG Unspecified fracture of sacrum, subsequent encounter for fracture with delayed healing: Secondary | ICD-10-CM | POA: Diagnosis not present

## 2016-05-27 DIAGNOSIS — M8580 Other specified disorders of bone density and structure, unspecified site: Secondary | ICD-10-CM | POA: Diagnosis not present

## 2016-05-27 DIAGNOSIS — K219 Gastro-esophageal reflux disease without esophagitis: Secondary | ICD-10-CM

## 2016-05-27 DIAGNOSIS — S3210XA Unspecified fracture of sacrum, initial encounter for closed fracture: Secondary | ICD-10-CM

## 2016-05-27 DIAGNOSIS — E669 Obesity, unspecified: Secondary | ICD-10-CM | POA: Diagnosis not present

## 2016-05-27 DIAGNOSIS — J302 Other seasonal allergic rhinitis: Secondary | ICD-10-CM | POA: Insufficient documentation

## 2016-05-27 DIAGNOSIS — M5116 Intervertebral disc disorders with radiculopathy, lumbar region: Secondary | ICD-10-CM

## 2016-05-27 DIAGNOSIS — E1169 Type 2 diabetes mellitus with other specified complication: Secondary | ICD-10-CM

## 2016-05-27 HISTORY — DX: Unspecified fracture of sacrum, initial encounter for closed fracture: S32.10XA

## 2016-05-27 NOTE — Patient Instructions (Signed)
Simple Pelvic Fracture, Adult °A pelvic fracture is a break in one of the pelvic bones. The pelvic bones include the bones that you sit on and the bones that make up the lower part of your spine. A pelvic fracture is called simple if the broken bones are stable and are not moving out of place. °What are the causes? °Common causes of this type of fracture include: °· A fall. °· A car accident. °· Force or pressure applied to the pelvis. ° °What increases the risk? °You may be at higher risk for this type of fracture if: °· You play high-impact sports. °· You are an older person with a condition that causes weak bones (osteoporosis). °· You have a bone-weakening disease. ° °What are the signs or symptoms? °Signs and symptoms may include: °· Tenderness, swelling, or bruising in the affected area. °· Pain when moving the hip. °· Pain when walking or standing. ° °How is this diagnosed? °A diagnosis is made with a physical exam and X-rays. Sometimes, a CT scan is also done. °How is this treated? °The goal of treatment is to get the bones to heal in a good position. Treatment of a simple pelvic fracture usually involves staying in bed (bed rest) and using crutches or a walker until the bones heal. Medicines may be prescribed for pain. Medicines may also be prescribed that help to prevent blood clots from forming in the legs. °Follow these instructions at home: °Managing pain, stiffness, and swelling °· If directed, apply ice to the injured area: °? Put ice in a plastic bag. °? Place a towel between your skin and the bag. °? Leave the ice on for 20 minutes, 2-3 times a day. °· Raise the injured area above the level of your heart while you are sitting or lying down. °Driving °· Do not  drive or operate heavy machinery until your health care provider tells you it is safe to do. °Activity °· Stay on bed rest for as long as directed by your health care provider. °· While on bed rest: °? Change the position of your legs every  1-2 hours. This keeps blood moving well through both of your legs. °? You may sit for as long as you feel comfortable. °· After bed rest: °? Avoid strenuous activities for as long as directed by your health care provider. °? Return to your normal activities as directed by your health care provider. Ask your health care provider what activities are safe for you. °Safety °· Do not use the injured limb to support your body weight until your health care provider says that you can. Use crutches or a walker as directed by your health care provider. °General instructions °· Do not use any tobacco products, including cigarettes, chewing tobacco, or electronic cigarettes. Tobacco can delay bone healing. If you need help quitting, ask your health care provider. °· Take medicines only as directed by your health care provider. °· Keep all follow-up visits as directed by your health care provider. This is important. °Contact a health care provider if: °· Your pain gets worse. °· Your pain is not relieved with medicines. °Get help right away if: °· You feel light-headed or faint. °· You develop chest pain. °· You develop shortness of breath. °· You have a fever. °· You have blood in your urine or your stools. °· You have vaginal bleeding. °· You have difficulty or pain with urination or with a bowel movement. °· You have difficulty or increased pain   with walking. °· You have new or increased swelling in one of your legs. °· You have numbness in your legs or groin area. °This information is not intended to replace advice given to you by your health care provider. Make sure you discuss any questions you have with your health care provider. °Document Released: 04/29/2001 Document Revised: 10/15/2015 Document Reviewed: 10/12/2013 °Elsevier Interactive Patient Education © 2017 [employer] Inc[employer] ° °

## 2016-05-27 NOTE — Progress Notes (Signed)
Subjective:  I acted as a Education administrator for Dr. Charlett Blake. [place], [place]   Patient ID: Brenda [name], female    DOB: 1945/10/27, 71 y.o.   MRN: [id number]  Chief Complaint  Patient presents with  . Follow-up  . Hypertension  . Diabetes    HPI  Patient is in today for follow up on hypertension, diabetes and other medical concerns. Patient complains of left side and lower back pain. She stated it had been going on for about 1 month negative for injury. No falls or trauma. No incontinence, polyuria or polydipsia. Denies CP/palp/SOB/HA/congestion/fevers/GI or GU c/o. Taking meds as prescribed  Patient Care Team: Mosie Lukes, MD as PCP - General (Family Medicine)   Past Medical History:  Diagnosis Date  . Depression    counseling  . Diabetes mellitus type 2 in obese ([place]) 01/31/2013  . Esophageal reflux 08/24/2012  . GERD (gastroesophageal reflux disease)   . History of chicken pox    childhood age 70  . Hyperlipidemia    2011  . Hypertension   . Left hip pain 03/19/2016  . Medicare annual wellness visit, subsequent 12/25/2014  . Numerous moles 05/09/2015  . Obesity 10/17/2010  . Osteopenia 03/19/2016  . Other and unspecified hyperlipidemia 04/26/2012  . Other and unspecified hyperlipidemia 08/24/2012  . Preventative health care 03/24/2016  . Sacral fracture ([place]) 05/27/2016  . Seasonal allergies   . Tinea pedis 03/24/2016  . Urinary incontinence 08/24/2012    Past Surgical History:  Procedure Laterality Date  . BLADDER REPAIR     2002  . TOTAL ABDOMINAL HYSTERECTOMY     2002    Family History  Problem Relation Age of Onset  . Colon cancer Sister 32  . Breast cancer Sister 59  . Heart disease Father 26  . Hypertension Father   . Dementia Mother   . Cancer Paternal Grandfather     bone    Social History   Social History  . Marital status: Divorced    Spouse name: N/A  . Number of children: N/A  . Years of education: N/A   Occupational History  . Not on file.    Social History Main Topics  . Smoking status: Former Smoker    Packs/day: 0.20    Years: 15.00    Types: Cigarettes  . Smokeless tobacco: Never Used     Comment: Qquit 10 years ago  . Alcohol use No  . Drug use: No  . Sexual activity: No   Other Topics Concern  . Not on file   Social History Narrative  . No narrative on file    Outpatient Medications Prior to Visit  Medication Sig Dispense Refill  . acetaminophen (TYLENOL ARTHRITIS PAIN) 650 MG CR tablet Take 1,300 mg by mouth daily.    Marland Kitchen aspirin 81 MG tablet Take 81 mg by mouth daily.    . benzonatate (TESSALON) 100 MG capsule Take 1 capsule (100 mg total) by mouth 3 (three) times daily as needed for cough. (Patient not taking: Reported on 03/29/2016) 40 capsule 0  . escitalopram (LEXAPRO) 20 MG tablet TAKE 1 TABLET (20 MG TOTAL) BY MOUTH DAILY. 90 tablet 2  . HYDROcodone-homatropine (HYCODAN) 5-1.5 MG/5ML syrup Take 5 mLs by mouth every 8 (eight) hours as needed for cough. 90 mL 0  . losartan (COZAAR) 50 MG tablet TAKE 1 TABLET TWICE DAILY. 180 tablet 2  . pravastatin (PRAVACHOL) 20 MG tablet TAKE 1 TABLET (20 MG TOTAL) BY MOUTH DAILY. 90 tablet  2  . Probiotic Product (PROBIOTIC DAILY) CAPS Take 1 capsule by mouth daily.    . ranitidine (ZANTAC) 300 MG tablet TAKE 1 TABLET (300 MG) BY MOUTH AT BEDTIME AS NEEDED FOR HEARTBURN. 90 tablet 2  . tiZANidine (ZANAFLEX) 4 MG tablet Take 1 tablet (4 mg total) by mouth 3 (three) times daily. 90 tablet 1  . traMADol (ULTRAM) 50 MG tablet Take 1 tablet (50 mg total) by mouth 2 (two) times daily as needed for severe pain. 20 tablet 0  . triamterene-hydrochlorothiazide (MAXZIDE-25) 37.5-25 MG tablet TAKE 1 TABLET EVERY DAY 90 tablet 2  . TURMERIC PO Take 1 capsule by mouth daily.     Facility-Administered Medications Prior to Visit  Medication Dose Route Frequency Provider Last Rate Last Dose  . tiZANidine (ZANAFLEX) tablet 4 mg  4 mg Oral Q8H PRN Mosie Lukes, MD        Allergies   Allergen Reactions  . Mobic [Meloxicam] Nausea Only    Review of Systems  Constitutional: Negative for fever and malaise/fatigue.  HENT: Negative for congestion.   Eyes: Negative for blurred vision.  Respiratory: Positive for shortness of breath. Negative for cough.   Cardiovascular: Negative for chest pain, palpitations and leg swelling.  Gastrointestinal: Negative for vomiting.  Musculoskeletal: Positive for back pain.  Skin: Negative for rash.  Neurological: Negative for loss of consciousness and headaches.       Objective:    Physical Exam  Constitutional: She is oriented to person, place, and time. She appears well-developed and well-nourished. No distress.  HENT:  Head: Normocephalic and atraumatic.  Eyes: Conjunctivae are normal.  Neck: Normal range of motion. No thyromegaly present.  Cardiovascular: Normal rate and regular rhythm.   Pulmonary/Chest: Effort normal and breath sounds normal. She has no wheezes.  Abdominal: Soft. Bowel sounds are normal. There is no tenderness.  Musculoskeletal: Normal range of motion. She exhibits no edema or deformity.  Neurological: She is alert and oriented to person, place, and time.  Skin: Skin is warm and dry. She is not diaphoretic.  Psychiatric: She has a normal mood and affect.    BP 137/74 (BP Location: Left Arm, Patient Position: Sitting, Cuff Size: Large)   Pulse 86   Temp 98.4 F (36.9 C) (Oral)   Resp 18   Wt 217 lb (98.4 kg)   SpO2 100%   BMI 32.99 kg/m  Wt Readings from Last 3 Encounters:  05/27/16 217 lb (98.4 kg)  03/29/16 215 lb 12.8 oz (97.9 kg)  03/29/16 218 lb (98.9 kg)   BP Readings from Last 3 Encounters:  05/27/16 137/74  03/29/16 (!) 110/58  03/29/16 135/82     Immunization History  Administered Date(s) Administered  . Influenza Split 01/16/2011, 11/25/2011, 01/03/2014  . Influenza Whole 12/31/2012  . Influenza,inj,Quad PF,36+ Mos 12/20/2013, 12/19/2014  . Influenza-Unspecified 11/09/2015   . Pneumococcal Conjugate-13 01/25/2013  . Pneumococcal Polysaccharide-23 12/21/2007, 12/19/2014  . Tdap 04/17/2011    Health Maintenance  Topic Date Due  . OPHTHALMOLOGY EXAM  10/20/1955  . FOOT EXAM  03/26/2016  . COLONOSCOPY  05/01/2016  . HEMOGLOBIN A1C  09/26/2016  . MAMMOGRAM  12/03/2016  . TETANUS/TDAP  04/16/2021  . INFLUENZA VACCINE  Completed  . DEXA SCAN  Completed  . Hepatitis C Screening  Completed  . PNA vac Low Risk Adult  Completed    Lab Results  Component Value Date   WBC 5.7 03/29/2016   HGB 14.9 03/29/2016   HCT 43.8 03/29/2016  PLT 342.0 03/29/2016   GLUCOSE 164 (H) 03/29/2016   CHOL 181 03/29/2016   TRIG 114.0 03/29/2016   HDL 47.30 03/29/2016   LDLCALC 110 (H) 03/29/2016   ALT 11 03/29/2016   AST 12 03/29/2016   NA 138 03/29/2016   K 3.7 03/29/2016   CL 103 03/29/2016   CREATININE 1.15 03/29/2016   BUN 22 03/29/2016   CO2 23 03/29/2016   TSH 0.98 03/29/2016   HGBA1C 6.7 (H) 03/29/2016   MICROALBUR 2.3 (H) 03/27/2015    Lab Results  Component Value Date   TSH 0.98 03/29/2016   Lab Results  Component Value Date   WBC 5.7 03/29/2016   HGB 14.9 03/29/2016   HCT 43.8 03/29/2016   MCV 87.5 03/29/2016   PLT 342.0 03/29/2016   Lab Results  Component Value Date   NA 138 03/29/2016   K 3.7 03/29/2016   CO2 23 03/29/2016   GLUCOSE 164 (H) 03/29/2016   BUN 22 03/29/2016   CREATININE 1.15 03/29/2016   BILITOT 0.4 03/29/2016   ALKPHOS 73 03/29/2016   AST 12 03/29/2016   ALT 11 03/29/2016   PROT 7.6 03/29/2016   ALBUMIN 4.2 03/29/2016   CALCIUM 9.4 03/29/2016   GFR 59.92 (L) 03/29/2016   Lab Results  Component Value Date   CHOL 181 03/29/2016   Lab Results  Component Value Date   HDL 47.30 03/29/2016   Lab Results  Component Value Date   LDLCALC 110 (H) 03/29/2016   Lab Results  Component Value Date   TRIG 114.0 03/29/2016   Lab Results  Component Value Date   CHOLHDL 4 03/29/2016   Lab Results  Component Value Date    HGBA1C 6.7 (H) 03/29/2016         Assessment & Plan:   Problem List Items Addressed This Visit    Hypertension - Primary    Well controlled, no changes to meds. Encouraged heart healthy diet such as the DASH diet and exercise as tolerated.       Obesity    Encouraged DASH diet, decrease po intake and increase exercise as tolerated. Needs 7-8 hours of sleep nightly. Avoid trans fats, eat small, frequent meals every 4-5 hours with lean proteins, complex carbs and healthy fats. Minimize simple carbs, bariatric referral      Lumbar disc disease with radiculopathy    Encouraged moist heat and gentle stretching as tolerated. May try NSAIDs and prescription meds as directed and report if symptoms worsen or seek immediate care. Topical treatments recommended      Diabetes mellitus type 2 in obese ([facility])   GERD (gastroesophageal reflux disease)    Avoid offending foods, start probiotics. Do not eat large meals in late evening and consider raising head of bed.       Osteopenia    Encouraged to get adequate exercise, calcium and vitamin d intake      Sacral fracture [facility])   Relevant Orders   Ambulatory referral to Orthopedic Surgery      I am having Ms. Drenning maintain her aspirin, PROBIOTIC DAILY, TURMERIC PO, acetaminophen, ranitidine, escitalopram, triamterene-hydrochlorothiazide, pravastatin, losartan, benzonatate, HYDROcodone-homatropine, tiZANidine, and traMADol. We will continue to administer tiZANidine.  No orders of the defined types were placed in this encounter.   CMA served as Education administrator during this visit. History, Physical and Plan performed by medical provider. Documentation and orders reviewed and attested to.  Penni Homans, MD

## 2016-05-27 NOTE — Progress Notes (Signed)
Pre visit review using our clinic review tool, if applicable. No additional management support is needed unless otherwise documented below in the visit note. 

## 2016-05-31 NOTE — Assessment & Plan Note (Signed)
Well controlled, no changes to meds. Encouraged heart healthy diet such as the DASH diet and exercise as tolerated.  °

## 2016-05-31 NOTE — Assessment & Plan Note (Signed)
Encouraged DASH diet, decrease po intake and increase exercise as tolerated. Needs 7-8 hours of sleep nightly. Avoid trans fats, eat small, frequent meals every 4-5 hours with lean proteins, complex carbs and healthy fats. Minimize simple carbs, bariatric referral 

## 2016-05-31 NOTE — Assessment & Plan Note (Signed)
Encouraged to get adequate exercise, calcium and vitamin d intake 

## 2016-05-31 NOTE — Assessment & Plan Note (Signed)
Avoid offending foods, start probiotics. Do not eat large meals in late evening and consider raising head of bed.  

## 2016-05-31 NOTE — Assessment & Plan Note (Signed)
Encouraged moist heat and gentle stretching as tolerated. May try NSAIDs and prescription meds as directed and report if symptoms worsen or seek immediate care. Topical treatments recommended

## 2016-06-10 ENCOUNTER — Ambulatory Visit (INDEPENDENT_AMBULATORY_CARE_PROVIDER_SITE_OTHER): Payer: Medicare HMO | Admitting: Orthopaedic Surgery

## 2016-06-10 ENCOUNTER — Ambulatory Visit (HOSPITAL_BASED_OUTPATIENT_CLINIC_OR_DEPARTMENT_OTHER)
Admission: RE | Admit: 2016-06-10 | Discharge: 2016-06-10 | Disposition: A | Discharge status: Home / Self Care | Payer: Medicare HMO | Source: Ambulatory Visit | Attending: Orthopaedic Surgery | Admitting: Orthopaedic Surgery

## 2016-06-10 ENCOUNTER — Other Ambulatory Visit (INDEPENDENT_AMBULATORY_CARE_PROVIDER_SITE_OTHER): Payer: Self-pay

## 2016-06-10 DIAGNOSIS — M5116 Intervertebral disc disorders with radiculopathy, lumbar region: Secondary | ICD-10-CM

## 2016-06-10 DIAGNOSIS — M533 Sacrococcygeal disorders, not elsewhere classified: Secondary | ICD-10-CM

## 2016-06-10 DIAGNOSIS — M5442 Lumbago with sciatica, left side: Secondary | ICD-10-CM

## 2016-06-10 NOTE — Progress Notes (Signed)
Office Visit Note   Patient: Brenda [name]           Date of Birth: April 02, 1945           MRN: [id number] Visit Date: 06/10/2016              Requested by: Mosie Lukes, MD [address] [place], [place] [address] PCP: Penni Homans, MD   Assessment & Plan: Visit Diagnoses:  1. Coccyx pain   2. Lumbar disc disease with radiculopathy     Plan: I gave her reassurance that her sacral fracture is healed. However I do feel her symptoms are more back and lumbar spine related. I offered her physical therapy but do feel that she'll benefit from an epidural steroid injection. If this can be set up and [place] for her convenience we will do so. I will see her back myself in about 4 weeks to see how she is doing overall.  Follow-Up Instructions: Return in about 4 weeks (around 07/08/2016).   Orders:  Orders Placed This Encounter  Procedures  . DG Sacrum/Coccyx   No orders of the defined types were placed in this encounter.     Procedures: No procedures performed   Clinical Data: No additional findings.   Subjective: No chief complaint on file. The patient is a very pleasant 71 year old female who is quite active who is sent to me originally to assess a fracture of her sacrum/coccyx area that happened after a fall in January. However her pain has been around her low back to the left side it radiates down her left leg. She actually has an MRI of her lumbar spine on the system from 2013 where she was having similar symptoms. She says she thinks she may have had a injection at that time but she is not really sure. She is not complaining of tailbone pain and does not complain of pain in her groin or hip. Her pain seems to be the lower aspect of her lumbar spine to the left side with a slight radicular component. She denies any significant weakness in her legs or numbness and tingling. She says that it is aggravating pain and effects her with activities and laying down at  night.  HPI  Review of Systems She denies any headache, chest pain, shortness of breath, fever, chills, nausea, vomiting.  Objective: Vital Signs: There were no vitals taken for this visit.  Physical Exam She is alert and oriented 3 and in no acute distress. She does not walk with any significant limp. Ortho Exam She gets up on the exam table easily. Her left hip exam is normal. She has no pain over her tailbone her coccyx. Her pain seems to be spine related to the lowest aspect the lumbar spine to the left side and it is facet joint mediated as well. She does have a positive straight leg raise to the left side. Her reflexes and sensation as well as motor exam are equal. Specialty Comments:  No specialty comments available.  Imaging: Dg Sacrum/coccyx  Result Date: 06/10/2016 CLINICAL DATA:  Cough sick pain for a month EXAM: SACRUM AND COCCYX - 2+ VIEW COMPARISON:  03/29/2016 FINDINGS: There is no evidence of an acute fracture. Healing/healed distal sacral fracture. No aggressive osseous lesion. Degenerative disc disease with disc height loss at L5-S1. Minimal grade 1 anterolisthesis of L5 on S1 secondary to bilateral moderate facet arthropathy. IMPRESSION: Healing/healed distal sacral fracture. If there is persisting clinical concern  recommend MRI of the sacrum and coccyx to evaluate the degree of marrow edema. Electronically Signed   By: Kathreen Devoid   On: 06/10/2016 08:46   I independently reviewed the x-rays of sacrum and coccyx and feel that the fracture has healed. I then reviewed the MRI of her lumbar spine from 2013 and it shows significant degenerative disc disease at multiple levels of the lumbar spine. There was deathly a herniated disc to the left at L4-L5 potentially affecting the L5 nerve root. There is also facet disease at that level.  PMFS History: Patient Active Problem List   Diagnosis Date Noted  . Sacral fracture ([place]) 05/27/2016  . Lower back injury, initial encounter  04/02/2016  . Tinea pedis 03/24/2016  . Preventative health care 03/24/2016  . Left hip pain 03/19/2016  . Osteopenia 03/19/2016  . Osteoarthritis of left glenohumeral joint 12/05/2015  . Numerous moles 05/09/2015  . Muscle spasm 12/25/2014  . Medicare annual wellness visit, subsequent 12/25/2014  . UARS (upper airway resistance syndrome) 11/28/2014  . GERD (gastroesophageal reflux disease) 11/28/2014  . Oxygen desaturation during sleep 05/24/2014  . Evaluate for OSA (obstructive sleep apnea) 02/14/2014  . Dyspnea 12/26/2013  . Cervical cancer screening 04/26/2013  . Diabetes mellitus type 2 in obese ([place]) 01/31/2013  . Urinary incontinence 08/24/2012  . Hyperlipidemia 04/26/2012  . Lumbar disc disease with radiculopathy 01/20/2012  . Carpal tunnel syndrome on right 01/20/2012  . Arthritis 11/25/2011  . Colon cancer screening 04/17/2011  . Hypertension 10/17/2010  . Obesity 10/17/2010  . Anxiety 10/17/2010   Past Medical History:  Diagnosis Date  . Depression    counseling  . Diabetes mellitus type 2 in obese ([place]) 01/31/2013  . Esophageal reflux 08/24/2012  . GERD (gastroesophageal reflux disease)   . History of chicken pox    childhood age 20  . Hyperlipidemia    2011  . Hypertension   . Left hip pain 03/19/2016  . Medicare annual wellness visit, subsequent 12/25/2014  . Numerous moles 05/09/2015  . Obesity 10/17/2010  . Osteopenia 03/19/2016  . Other and unspecified hyperlipidemia 04/26/2012  . Other and unspecified hyperlipidemia 08/24/2012  . Preventative health care 03/24/2016  . Sacral fracture ([place]) 05/27/2016  . Seasonal allergies   . Tinea pedis 03/24/2016  . Urinary incontinence 08/24/2012    Family History  Problem Relation Age of Onset  . Colon cancer Sister 80  . Breast cancer Sister 1  . Heart disease Father 108  . Hypertension Father   . Dementia Mother   . Cancer Paternal Grandfather     bone    Past Surgical History:  Procedure Laterality Date  .  BLADDER REPAIR     2002  . TOTAL ABDOMINAL HYSTERECTOMY     2002   Social History   Occupational History  . Not on file.   Social History Main Topics  . Smoking status: Former Smoker    Packs/day: 0.20    Years: 15.00    Types: Cigarettes  . Smokeless tobacco: Never Used     Comment: Qquit 10 years ago  . Alcohol use No  . Drug use: No  . Sexual activity: No

## 2016-07-15 ENCOUNTER — Ambulatory Visit (INDEPENDENT_AMBULATORY_CARE_PROVIDER_SITE_OTHER): Payer: Medicare HMO | Admitting: Orthopaedic Surgery

## 2016-07-15 ENCOUNTER — Ambulatory Visit (INDEPENDENT_AMBULATORY_CARE_PROVIDER_SITE_OTHER): Payer: Medicare HMO | Admitting: Family Medicine

## 2016-07-15 ENCOUNTER — Encounter (INDEPENDENT_AMBULATORY_CARE_PROVIDER_SITE_OTHER): Payer: Self-pay

## 2016-07-15 ENCOUNTER — Encounter: Payer: Self-pay | Admitting: Family Medicine

## 2016-07-15 VITALS — BP 110/72 | HR 85 | Temp 97.6°F | Ht 68.0 in | Wt 217.2 lb

## 2016-07-15 DIAGNOSIS — M545 Low back pain, unspecified: Secondary | ICD-10-CM

## 2016-07-15 DIAGNOSIS — J301 Allergic rhinitis due to pollen: Secondary | ICD-10-CM

## 2016-07-15 DIAGNOSIS — G8929 Other chronic pain: Secondary | ICD-10-CM

## 2016-07-15 MED ORDER — FLUTICASONE PROPIONATE 50 MCG/ACT NA SUSP: 2.0000 | Freq: Every day | NASAL | 3 refills | Status: DC

## 2016-07-15 MED ORDER — FEXOFENADINE HCL 180 MG PO TABS: 180.0000 mg | ORAL_TABLET | Freq: Every day | ORAL | 5 refills | Status: DC

## 2016-07-15 MED FILL — SM FEXOFENADINE HCL 180 MG: 180 | 30 days supply | Qty: 30 | Fill #0

## 2016-07-15 MED FILL — FLUTICASONE PROP 50 MCG SPR: 50 | 30 days supply | Qty: 16 | Fill #0

## 2016-07-15 NOTE — Progress Notes (Signed)
Chief Complaint  Patient presents with  . Allergies    Subjective: Patient is a 71 y.o. female here for allergies.  3 weeks of intermittent hoarseness that comes and goes. She started out w a sore throat, that is now resolved, but now has a more hoarse voice.She has associated runny and stuffy nose along with itchy/watery eyes. She does have a history of allergies, exacerbated by pollen. She is to be in Claritin the past worked well, however she is not currently taking any. She denies any shortness of breath, fevers, sick contacts, or ear pain/drainage.  ROS: Const: no fevers  Lungs: Denies SOB    Family History  Problem Relation Age of Onset  . Colon cancer Sister 82  . Breast cancer Sister 83  . Heart disease Father 83  . Hypertension Father   . Dementia Mother   . Cancer Paternal Grandfather        bone   Past Medical History:  Diagnosis Date  . Depression    counseling  . Diabetes mellitus type 2 in obese ([employer]) 01/31/2013  . Esophageal reflux 08/24/2012  . GERD (gastroesophageal reflux disease)   . History of chicken pox    childhood age 47  . Hyperlipidemia    2011  . Hypertension   . Left hip pain 03/19/2016  . Medicare annual wellness visit, subsequent 12/25/2014  . Numerous moles 05/09/2015  . Obesity 10/17/2010  . Osteopenia 03/19/2016  . Other and unspecified hyperlipidemia 04/26/2012  . Other and unspecified hyperlipidemia 08/24/2012  . Preventative health care 03/24/2016  . Sacral fracture ([place]) 05/27/2016  . Seasonal allergies   . Tinea pedis 03/24/2016  . Urinary incontinence 08/24/2012   Allergies  Allergen Reactions  . Mobic [Meloxicam] Nausea Only    Current Outpatient Prescriptions:  .  acetaminophen (TYLENOL ARTHRITIS PAIN) 650 MG CR tablet, Take 1,300 mg by mouth daily., Disp: , Rfl:  .  aspirin 81 MG tablet, Take 81 mg by mouth daily., Disp: , Rfl:  .  escitalopram (LEXAPRO) 20 MG tablet, TAKE 1 TABLET (20 MG TOTAL) BY MOUTH DAILY., Disp: 90 tablet,  Rfl: 2 .  losartan (COZAAR) 50 MG tablet, TAKE 1 TABLET TWICE DAILY., Disp: 180 tablet, Rfl: 2 .  OVER THE COUNTER MEDICATION, Hemorrohoid supporsitories-As directed, Disp: , Rfl:  .  pravastatin (PRAVACHOL) 20 MG tablet, TAKE 1 TABLET (20 MG TOTAL) BY MOUTH DAILY., Disp: 90 tablet, Rfl: 2 .  Probiotic Product (PROBIOTIC DAILY) CAPS, Take 1 capsule by mouth daily., Disp: , Rfl:  .  ranitidine (ZANTAC) 300 MG tablet, TAKE 1 TABLET (300 MG) BY MOUTH AT BEDTIME AS NEEDED FOR HEARTBURN., Disp: 90 tablet, Rfl: 2 .  triamterene-hydrochlorothiazide (MAXZIDE-25) 37.5-25 MG tablet, TAKE 1 TABLET EVERY DAY, Disp: 90 tablet, Rfl: 2 .  TURMERIC PO, Take 1 capsule by mouth daily., Disp: , Rfl:   Objective: BP 110/72 (BP Location: Left Arm, Patient Position: Sitting, Cuff Size: Large)   Pulse 85   Temp 97.6 F (36.4 C) (Oral)   Ht 5\' 8"  (1.727 m)   Wt 217 lb 3.2 oz (98.5 kg)   SpO2 91%   BMI 33.03 kg/m  General: Awake, appears stated age HEENT: MMM, pharynx pink without exudate, EOMi, ears are patent bilaterally, turbinates enlarged on the left compared to the right, rhinorrhea present bilaterally Heart: RRR, no murmurs Lungs: CTAB, no rales, wheezes or rhonchi. No accessory muscle use Psych: Age appropriate judgment and insight, normal affect and mood  Assessment and Plan: Seasonal allergic  rhinitis due to pollen - Plan: fexofenadine (ALLEGRA ALLERGY) 180 MG tablet, fluticasone (FLONASE) 50 MCG/ACT nasal spray  Orders as above. Restart above medications. After visit list also provided disease are available over-the-counter. Counseled on aiming towards the same side eye when using the intranasal corticosteroid. Follow-up as needed. The patient voiced understanding and agreement to the plan.  [place], DO 07/15/16  4:38 PM

## 2016-07-15 NOTE — Patient Instructions (Signed)
Claritin (loratadine), Allegra (fexofenadine), Zyrtec (cetirizine); these are listed in order from weakest to strongest. Generic, and therefore cheaper, options are in the parentheses.   Flonase (fluticasone); nasal spray that is over the counter. 2 sprays each nostril, once daily. Aim towards the same side eye when you spray.  There are available OTC, and the generic versions, which may be cheaper, are in parentheses. Show this to a pharmacist if you have trouble finding any of these items.  

## 2016-07-15 NOTE — Progress Notes (Signed)
The patient was supposed to be getting some type of spine intervention and [place]. Apparently that is not happening till the 22nd. Originally I recommended an L5-S1 epidural steroid injection her left. Her pain seems to be though in the facet joints or lumbar spine and radiates around to the front. This seems to be at the mid to low lumbar spine. She denies any radicular symptoms today. She does have significant truncal obesity with pain with flexion extension of her lumbar spine.  I told her to go and get the injection and [place] and I will see her back about a month after that injection.

## 2016-07-19 ENCOUNTER — Ambulatory Visit: Payer: Commercial Managed Care - HMO | Admitting: Family Medicine

## 2016-07-23 DIAGNOSIS — M47816 Spondylosis without myelopathy or radiculopathy, lumbar region: Secondary | ICD-10-CM | POA: Diagnosis not present

## 2016-07-23 DIAGNOSIS — M5416 Radiculopathy, lumbar region: Secondary | ICD-10-CM | POA: Diagnosis not present

## 2016-07-23 DIAGNOSIS — M5136 Other intervertebral disc degeneration, lumbar region: Secondary | ICD-10-CM | POA: Diagnosis not present

## 2016-07-26 ENCOUNTER — Telehealth: Payer: Self-pay

## 2016-07-26 NOTE — Telephone Encounter (Signed)
Orthopaedic and sports medicine form has been faxed over to [phone].  Orders are to stop Aspirin 1 week prior to the procedure.   PC

## 2016-08-05 ENCOUNTER — Encounter: Payer: Self-pay | Admitting: Family Medicine

## 2016-08-05 ENCOUNTER — Ambulatory Visit (INDEPENDENT_AMBULATORY_CARE_PROVIDER_SITE_OTHER): Payer: Medicare HMO | Admitting: Family Medicine

## 2016-08-05 DIAGNOSIS — E669 Obesity, unspecified: Secondary | ICD-10-CM

## 2016-08-05 DIAGNOSIS — E782 Mixed hyperlipidemia: Secondary | ICD-10-CM | POA: Diagnosis not present

## 2016-08-05 DIAGNOSIS — E1169 Type 2 diabetes mellitus with other specified complication: Secondary | ICD-10-CM

## 2016-08-05 DIAGNOSIS — I10 Essential (primary) hypertension: Secondary | ICD-10-CM

## 2016-08-05 NOTE — Assessment & Plan Note (Signed)
Encouraged heart healthy diet, increase exercise, avoid trans fats, consider a krill oil cap daily 

## 2016-08-05 NOTE — Progress Notes (Signed)
Pre visit review using our clinic review tool, if applicable. No additional management support is needed unless otherwise documented below in the visit note. 

## 2016-08-05 NOTE — Assessment & Plan Note (Signed)
Well controlled, no changes to meds. Encouraged heart healthy diet such as the DASH diet and exercise as tolerated.  °

## 2016-08-05 NOTE — Assessment & Plan Note (Signed)
hgba1c acceptable, minimize simple carbs. Increase exercise as tolerated.  

## 2016-08-05 NOTE — Patient Instructions (Addendum)
Add Zyrtec or Claritin Plain Mucinex/Guaifenasin twice daily Vitamin C and/or Zinc such as Coldeeze  Hypertension Hypertension is another name for high blood pressure. High blood pressure forces your heart to work harder to pump blood. This can cause problems over time. There are two numbers in a blood pressure reading. There is a top number (systolic) over a bottom number (diastolic). It is best to have a blood pressure below 120/80. Healthy choices can help lower your blood pressure. You may need medicine to help lower your blood pressure if:  Your blood pressure cannot be lowered with healthy choices.  Your blood pressure is higher than 130/80.  Follow these instructions at home: Eating and drinking  If directed, follow the DASH eating plan. This diet includes: ? Filling half of your plate at each meal with fruits and vegetables. ? Filling one quarter of your plate at each meal with whole grains. Whole grains include whole wheat pasta, brown rice, and whole grain bread. ? Eating or drinking low-fat dairy products, such as skim milk or low-fat yogurt. ? Filling one quarter of your plate at each meal with low-fat (lean) proteins. Low-fat proteins include fish, skinless chicken, eggs, beans, and tofu. ? Avoiding fatty meat, cured and processed meat, or chicken with skin. ? Avoiding premade or processed food.  Eat less than 1,500 mg of salt (sodium) a day.  Limit alcohol use to no more than 1 drink a day for nonpregnant women and 2 drinks a day for men. One drink equals 12 oz of beer, 5 oz of wine, or 1 oz of hard liquor. Lifestyle  Work with your doctor to stay at a healthy weight or to lose weight. Ask your doctor what the best weight is for you.  Get at least 30 minutes of exercise that causes your heart to beat faster (aerobic exercise) most days of the week. This may include walking, swimming, or biking.  Get at least 30 minutes of exercise that strengthens your muscles  (resistance exercise) at least 3 days a week. This may include lifting weights or pilates.  Do not use any products that contain nicotine or tobacco. This includes cigarettes and e-cigarettes. If you need help quitting, ask your doctor.  Check your blood pressure at home as told by your doctor.  Keep all follow-up visits as told by your doctor. This is important. Medicines  Take over-the-counter and prescription medicines only as told by your doctor. Follow directions carefully.  Do not skip doses of blood pressure medicine. The medicine does not work as well if you skip doses. Skipping doses also puts you at risk for problems.  Ask your doctor about side effects or reactions to medicines that you should watch for. Contact a doctor if:  You think you are having a reaction to the medicine you are taking.  You have headaches that keep coming back (recurring).  You feel dizzy.  You have swelling in your ankles.  You have trouble with your vision. Get help right away if:  You get a very bad headache.  You start to feel confused.  You feel weak or numb.  You feel faint.  You get very bad pain in your: ? Chest. ? Belly (abdomen).  You throw up (vomit) more than once.  You have trouble breathing. Summary  Hypertension is another name for high blood pressure.  Making healthy choices can help lower blood pressure. If your blood pressure cannot be controlled with healthy choices, you may need to take  medicine. This information is not intended to replace advice given to you by your health care provider. Make sure you discuss any questions you have with your health care provider. Document Released: 08/07/2007 Document Revised: 01/17/2016 Document Reviewed: 01/17/2016 Elsevier Interactive Patient Education  [employer].

## 2016-08-05 NOTE — Assessment & Plan Note (Signed)
Encouraged DASH diet, decrease po intake and increase exercise as tolerated. Needs 7-8 hours of sleep nightly. Avoid trans fats, eat small, frequent meals every 4-5 hours with lean proteins, complex carbs and healthy fats. Minimize simple carbs, bariatric referral 

## 2016-08-05 NOTE — Progress Notes (Signed)
Patient ID: Brenda [name], female   DOB: Feb 02, 1946, 71 y.o.   MRN: [id number]   Subjective:  I acted as a Education administrator for Penni Homans, [place], [place]   Patient ID: Brenda [name], female    DOB: August 27, 1945, 71 y.o.   MRN: [id number]  No chief complaint on file.   Hypertension  This is a chronic problem. The problem is controlled. Pertinent negatives include no blurred vision, chest pain, headaches, malaise/fatigue, palpitations or shortness of breath. Risk factors for coronary artery disease include obesity and diabetes mellitus.    Patient is in today for a 57-month follow up for HTN. Patient states that she is drinking a tea daily that she feels is helping with her weight. Patient has a Hx of HTN, Type II Diabetes, GERD, arthritis, osteopenia. Patient has no acute concerns noted at this time. No polyuria or polydipsia. Denies CP/palp/SOB/HA/congestion/fevers/GI or GU c/o. Taking meds as prescribed  Patient Care Team: Mosie Lukes, MD as PCP - General (Family Medicine)   Past Medical History:  Diagnosis Date  . Depression    counseling  . Diabetes mellitus type 2 in obese ([place]) 01/31/2013  . Esophageal reflux 08/24/2012  . GERD (gastroesophageal reflux disease)   . History of chicken pox    childhood age 54  . Hyperlipidemia    2011  . Hypertension   . Left hip pain 03/19/2016  . Medicare annual wellness visit, subsequent 12/25/2014  . Numerous moles 05/09/2015  . Obesity 10/17/2010  . Osteopenia 03/19/2016  . Other and unspecified hyperlipidemia 04/26/2012  . Other and unspecified hyperlipidemia 08/24/2012  . Preventative health care 03/24/2016  . Sacral fracture ([place]) 05/27/2016  . Seasonal allergies   . Tinea pedis 03/24/2016  . Urinary incontinence 08/24/2012    Past Surgical History:  Procedure Laterality Date  . BLADDER REPAIR     2002  . TOTAL ABDOMINAL HYSTERECTOMY     2002    Family History  Problem Relation Age of Onset  . Colon cancer Sister 70  . Breast cancer  Sister 17  . Heart disease Father 28  . Hypertension Father   . Dementia Mother   . Cancer Paternal Grandfather        bone    Social History   Social History  . Marital status: Divorced    Spouse name: N/A  . Number of children: N/A  . Years of education: N/A   Occupational History  . Not on file.   Social History Main Topics  . Smoking status: Former Smoker    Packs/day: 0.20    Years: 15.00    Types: Cigarettes  . Smokeless tobacco: Never Used     Comment: Qquit 10 years ago  . Alcohol use No  . Drug use: No  . Sexual activity: No   Other Topics Concern  . Not on file   Social History Narrative  . No narrative on file    Outpatient Medications Prior to Visit  Medication Sig Dispense Refill  . acetaminophen (TYLENOL ARTHRITIS PAIN) 650 MG CR tablet Take 1,300 mg by mouth daily.    Marland Kitchen aspirin 81 MG tablet Take 81 mg by mouth daily.    Marland Kitchen escitalopram (LEXAPRO) 20 MG tablet TAKE 1 TABLET (20 MG TOTAL) BY MOUTH DAILY. 90 tablet 2  . fluticasone (FLONASE) 50 MCG/ACT nasal spray Place 2 sprays into both nostrils daily. 16 g 3  . losartan (COZAAR) 50 MG tablet TAKE 1 TABLET TWICE DAILY. 180 tablet 2  .  OVER THE COUNTER MEDICATION Hemorrohoid supporsitories-As directed    . pravastatin (PRAVACHOL) 20 MG tablet TAKE 1 TABLET (20 MG TOTAL) BY MOUTH DAILY. 90 tablet 2  . Probiotic Product (PROBIOTIC DAILY) CAPS Take 1 capsule by mouth daily.    . ranitidine (ZANTAC) 300 MG tablet TAKE 1 TABLET (300 MG) BY MOUTH AT BEDTIME AS NEEDED FOR HEARTBURN. 90 tablet 2  . triamterene-hydrochlorothiazide (MAXZIDE-25) 37.5-25 MG tablet TAKE 1 TABLET EVERY DAY 90 tablet 2  . TURMERIC PO Take 1 capsule by mouth daily.    . fexofenadine (ALLEGRA ALLERGY) 180 MG tablet Take 1 tablet (180 mg total) by mouth daily. 30 tablet 5   Facility-Administered Medications Prior to Visit  Medication Dose Route Frequency Provider Last Rate Last Dose  . tiZANidine (ZANAFLEX) tablet 4 mg  4 mg Oral Q8H  PRN Mosie Lukes, MD        Allergies  Allergen Reactions  . Mobic [Meloxicam] Nausea Only    Review of Systems  Constitutional: Negative for fever and malaise/fatigue.  HENT: Negative for congestion.   Eyes: Negative for blurred vision.  Respiratory: Negative for cough and shortness of breath.   Cardiovascular: Negative for chest pain, palpitations and leg swelling.  Gastrointestinal: Negative for vomiting.  Musculoskeletal: Negative for back pain.  Skin: Negative for rash.  Neurological: Negative for loss of consciousness and headaches.       Objective:    Physical Exam  Constitutional: She is oriented to person, place, and time. She appears well-developed and well-nourished. No distress.  HENT:  Head: Normocephalic and atraumatic.  Eyes: Conjunctivae are normal.  Neck: Normal range of motion. No thyromegaly present.  Cardiovascular: Normal rate and regular rhythm.   Pulmonary/Chest: Effort normal and breath sounds normal. She has no wheezes.  Abdominal: Soft. Bowel sounds are normal. There is no tenderness.  Musculoskeletal: She exhibits no edema or deformity.  Neurological: She is alert and oriented to person, place, and time.  Skin: Skin is warm and dry. She is not diaphoretic.  Psychiatric: She has a normal mood and affect.    BP 132/79 (BP Location: Left Wrist, Patient Position: Sitting, Cuff Size: Normal)   Pulse 83   Temp 98 F (36.7 C) (Oral)   Wt 214 lb 6.4 oz (97.3 kg)   SpO2 100% Comment: RA  BMI 32.60 kg/m  Wt Readings from Last 3 Encounters:  08/05/16 214 lb 6.4 oz (97.3 kg)  07/15/16 217 lb 3.2 oz (98.5 kg)  05/27/16 217 lb (98.4 kg)   BP Readings from Last 3 Encounters:  08/05/16 132/79  07/15/16 110/72  05/27/16 137/74     Immunization History  Administered Date(s) Administered  . Influenza Split 01/16/2011, 11/25/2011, 01/03/2014  . Influenza Whole 12/31/2012  . Influenza,inj,Quad PF,36+ Mos 12/20/2013, 12/19/2014  .  Influenza-Unspecified 11/09/2015  . Pneumococcal Conjugate-13 01/25/2013  . Pneumococcal Polysaccharide-23 12/21/2007, 12/19/2014  . Tdap 04/17/2011    Health Maintenance  Topic Date Due  . OPHTHALMOLOGY EXAM  10/20/1955  . FOOT EXAM  03/26/2016  . COLONOSCOPY  05/01/2016  . INFLUENZA VACCINE  10/02/2016  . MAMMOGRAM  12/03/2016  . HEMOGLOBIN A1C  02/04/2017  . TETANUS/TDAP  04/16/2021  . DEXA SCAN  Completed  . Hepatitis C Screening  Completed  . PNA vac Low Risk Adult  Completed    Lab Results  Component Value Date   WBC 5.9 08/05/2016   HGB 14.8 08/05/2016   HCT 44.6 08/05/2016   PLT 347.0 08/05/2016   GLUCOSE 85  08/05/2016   CHOL 204 (H) 08/05/2016   TRIG 111.0 08/05/2016   HDL 55.40 08/05/2016   LDLCALC 127 (H) 08/05/2016   ALT 10 08/05/2016   AST 14 08/05/2016   NA 137 08/05/2016   K 4.5 08/05/2016   CL 104 08/05/2016   CREATININE 1.14 08/05/2016   BUN 35 (H) 08/05/2016   CO2 24 08/05/2016   TSH 1.35 08/05/2016   HGBA1C 6.8 (H) 08/05/2016   MICROALBUR 2.3 (H) 03/27/2015    Lab Results  Component Value Date   TSH 1.35 08/05/2016   Lab Results  Component Value Date   WBC 5.9 08/05/2016   HGB 14.8 08/05/2016   HCT 44.6 08/05/2016   MCV 89.5 08/05/2016   PLT 347.0 08/05/2016   Lab Results  Component Value Date   NA 137 08/05/2016   K 4.5 08/05/2016   CO2 24 08/05/2016   GLUCOSE 85 08/05/2016   BUN 35 (H) 08/05/2016   CREATININE 1.14 08/05/2016   BILITOT 0.3 08/05/2016   ALKPHOS 84 08/05/2016   AST 14 08/05/2016   ALT 10 08/05/2016   PROT 7.8 08/05/2016   ALBUMIN 4.3 08/05/2016   CALCIUM 9.7 08/05/2016   GFR 60.46 08/05/2016   Lab Results  Component Value Date   CHOL 204 (H) 08/05/2016   Lab Results  Component Value Date   HDL 55.40 08/05/2016   Lab Results  Component Value Date   LDLCALC 127 (H) 08/05/2016   Lab Results  Component Value Date   TRIG 111.0 08/05/2016   Lab Results  Component Value Date   CHOLHDL 4 08/05/2016    Lab Results  Component Value Date   HGBA1C 6.8 (H) 08/05/2016         Assessment & Plan:   Problem List Items Addressed This Visit    Hypertension    Well controlled, no changes to meds. Encouraged heart healthy diet such as the DASH diet and exercise as tolerated.       Relevant Orders   Comprehensive metabolic panel (Completed)   TSH (Completed)   CBC with Differential/Platelet (Completed)   Obesity    Encouraged DASH diet, decrease po intake and increase exercise as tolerated. Needs 7-8 hours of sleep nightly. Avoid trans fats, eat small, frequent meals every 4-5 hours with lean proteins, complex carbs and healthy fats. Minimize simple carbs, bariatric referral      Hyperlipidemia    Encouraged heart healthy diet, increase exercise, avoid trans fats, consider a krill oil cap daily      Relevant Orders   Lipid panel (Completed)   Diabetes mellitus type 2 in obese ([facility])    hgba1c acceptable, minimize simple carbs. Increase exercise as tolerated.        Relevant Orders   Hemoglobin A1c (Completed)   CBC with Differential/Platelet (Completed)      I have discontinued Ms. Arrowood's fexofenadine. I am also having her maintain her aspirin, PROBIOTIC DAILY, TURMERIC PO, acetaminophen, ranitidine, escitalopram, triamterene-hydrochlorothiazide, pravastatin, losartan, OVER THE COUNTER MEDICATION, and fluticasone. We will continue to administer tiZANidine.  No orders of the defined types were placed in this encounter.   CMA served as Education administrator during this visit. History, Physical and Plan performed by medical provider. Documentation and orders reviewed and attested to.  Penni Homans, MD

## 2016-08-06 LAB — COMPREHENSIVE METABOLIC PANEL
ALK PHOS: 84 U/L (ref 39–117)
ALT: 10 U/L (ref 0–35)
AST: 14 U/L (ref 0–37)
Albumin: 4.3 g/dL (ref 3.5–5.2)
BUN: 35 mg/dL — AB (ref 6–23)
CO2: 24 mEq/L (ref 19–32)
Calcium: 9.7 mg/dL (ref 8.4–10.5)
Chloride: 104 mEq/L (ref 96–112)
Creatinine, Ser: 1.14 mg/dL (ref 0.40–1.20)
GFR: 60.46 mL/min (ref >60.00)
GLUCOSE: 85 mg/dL (ref 70–99)
POTASSIUM: 4.5 meq/L (ref 3.5–5.1)
SODIUM: 137 meq/L (ref 135–145)
TOTAL PROTEIN: 7.8 g/dL (ref 6.0–8.3)
Total Bilirubin: 0.3 mg/dL (ref 0.2–1.2)

## 2016-08-06 LAB — CBC WITH DIFFERENTIAL/PLATELET
BASOS ABS: 0.1 10*3/uL (ref 0.0–0.1)
Basophils Relative: 2 % (ref 0.0–3.0)
Eosinophils Absolute: 0.4 10*3/uL (ref 0.0–0.7)
Eosinophils Relative: 6.5 % — ABNORMAL HIGH (ref 0.0–5.0)
HCT: 44.6 % (ref 36.0–46.0)
Hemoglobin: 14.8 g/dL (ref 12.0–15.0)
LYMPHS ABS: 2.4 10*3/uL (ref 0.7–4.0)
Lymphocytes Relative: 40.2 % (ref 12.0–46.0)
MCHC: 33.2 g/dL (ref 30.0–36.0)
MCV: 89.5 fl (ref 78.0–100.0)
MONOS PCT: 9.2 % (ref 3.0–12.0)
Monocytes Absolute: 0.5 10*3/uL (ref 0.1–1.0)
NEUTROS ABS: 2.5 10*3/uL (ref 1.4–7.7)
Neutrophils Relative %: 42.1 % — ABNORMAL LOW (ref 43.0–77.0)
PLATELETS: 347 10*3/uL (ref 150.0–400.0)
RBC: 4.98 Mil/uL (ref 3.87–5.11)
RDW: 14.8 % (ref 11.5–15.5)
WBC: 5.9 10*3/uL (ref 4.0–10.5)

## 2016-08-06 LAB — LIPID PANEL
CHOL/HDL RATIO: 4
Cholesterol: 204 mg/dL — ABNORMAL HIGH (ref 0–200)
HDL: 55.4 mg/dL (ref >39.00)
LDL CALC: 127 mg/dL — AB (ref 0–99)
NONHDL: 149.05
Triglycerides: 111 mg/dL (ref 0.0–149.0)
VLDL: 22.2 mg/dL (ref 0.0–40.0)

## 2016-08-06 LAB — HEMOGLOBIN A1C: Hgb A1c MFr Bld: 6.8 % — ABNORMAL HIGH (ref 4.6–6.5)

## 2016-08-06 LAB — TSH: TSH: 1.35 u[IU]/mL (ref 0.35–4.50)

## 2016-08-07 DIAGNOSIS — M5136 Other intervertebral disc degeneration, lumbar region: Secondary | ICD-10-CM | POA: Diagnosis not present

## 2016-08-07 DIAGNOSIS — M5416 Radiculopathy, lumbar region: Secondary | ICD-10-CM | POA: Diagnosis not present

## 2016-08-07 DIAGNOSIS — M47816 Spondylosis without myelopathy or radiculopathy, lumbar region: Secondary | ICD-10-CM | POA: Diagnosis not present

## 2016-09-09 DIAGNOSIS — M5136 Other intervertebral disc degeneration, lumbar region: Secondary | ICD-10-CM | POA: Diagnosis not present

## 2016-09-09 DIAGNOSIS — M461 Sacroiliitis, not elsewhere classified: Secondary | ICD-10-CM | POA: Diagnosis not present

## 2016-09-09 DIAGNOSIS — M47816 Spondylosis without myelopathy or radiculopathy, lumbar region: Secondary | ICD-10-CM | POA: Diagnosis not present

## 2016-09-10 ENCOUNTER — Other Ambulatory Visit: Payer: Self-pay | Admitting: Family Medicine

## 2016-10-03 DIAGNOSIS — M461 Sacroiliitis, not elsewhere classified: Secondary | ICD-10-CM | POA: Diagnosis not present

## 2016-10-03 DIAGNOSIS — M47816 Spondylosis without myelopathy or radiculopathy, lumbar region: Secondary | ICD-10-CM | POA: Diagnosis not present

## 2016-10-03 DIAGNOSIS — M5136 Other intervertebral disc degeneration, lumbar region: Secondary | ICD-10-CM | POA: Diagnosis not present

## 2016-11-29 ENCOUNTER — Other Ambulatory Visit: Payer: Self-pay | Admitting: Family Medicine

## 2016-11-29 DIAGNOSIS — I10 Essential (primary) hypertension: Secondary | ICD-10-CM

## 2017-02-03 ENCOUNTER — Other Ambulatory Visit: Payer: Self-pay | Admitting: Family Medicine

## 2017-02-03 DIAGNOSIS — I10 Essential (primary) hypertension: Secondary | ICD-10-CM

## 2017-02-21 ENCOUNTER — Encounter: Payer: Self-pay | Admitting: Family Medicine

## 2017-02-21 ENCOUNTER — Ambulatory Visit: Payer: Medicare HMO | Admitting: Family Medicine

## 2017-02-21 DIAGNOSIS — E669 Obesity, unspecified: Secondary | ICD-10-CM | POA: Diagnosis not present

## 2017-02-21 DIAGNOSIS — M5116 Intervertebral disc disorders with radiculopathy, lumbar region: Secondary | ICD-10-CM | POA: Diagnosis not present

## 2017-02-21 DIAGNOSIS — E1169 Type 2 diabetes mellitus with other specified complication: Secondary | ICD-10-CM

## 2017-02-21 DIAGNOSIS — M8580 Other specified disorders of bone density and structure, unspecified site: Secondary | ICD-10-CM | POA: Diagnosis not present

## 2017-02-21 DIAGNOSIS — K219 Gastro-esophageal reflux disease without esophagitis: Secondary | ICD-10-CM

## 2017-02-21 DIAGNOSIS — I10 Essential (primary) hypertension: Secondary | ICD-10-CM

## 2017-02-21 DIAGNOSIS — E782 Mixed hyperlipidemia: Secondary | ICD-10-CM

## 2017-02-21 LAB — LIPID PANEL
Cholesterol: 160 mg/dL (ref 0–200)
HDL: 44.3 mg/dL (ref >39.00)
LDL CALC: 95 mg/dL (ref 0–99)
NonHDL: 115.5
TRIGLYCERIDES: 101 mg/dL (ref 0.0–149.0)
Total CHOL/HDL Ratio: 4
VLDL: 20.2 mg/dL (ref 0.0–40.0)

## 2017-02-21 LAB — COMPREHENSIVE METABOLIC PANEL
ALT: 13 U/L (ref 0–35)
AST: 17 U/L (ref 0–37)
Albumin: 4.2 g/dL (ref 3.5–5.2)
Alkaline Phosphatase: 60 U/L (ref 39–117)
BUN: 25 mg/dL — ABNORMAL HIGH (ref 6–23)
CALCIUM: 9.1 mg/dL (ref 8.4–10.5)
CHLORIDE: 102 meq/L (ref 96–112)
CO2: 26 meq/L (ref 19–32)
CREATININE: 1.16 mg/dL (ref 0.40–1.20)
GFR: 59.17 mL/min — ABNORMAL LOW (ref >60.00)
Glucose, Bld: 117 mg/dL — ABNORMAL HIGH (ref 70–99)
POTASSIUM: 3.8 meq/L (ref 3.5–5.1)
SODIUM: 136 meq/L (ref 135–145)
Total Bilirubin: 0.4 mg/dL (ref 0.2–1.2)
Total Protein: 7.5 g/dL (ref 6.0–8.3)

## 2017-02-21 LAB — CBC
HCT: 42.1 % (ref 36.0–46.0)
Hemoglobin: 14 g/dL (ref 12.0–15.0)
MCHC: 33.3 g/dL (ref 30.0–36.0)
MCV: 91.8 fl (ref 78.0–100.0)
PLATELETS: 339 10*3/uL (ref 150.0–400.0)
RBC: 4.59 Mil/uL (ref 3.87–5.11)
RDW: 14.5 % (ref 11.5–15.5)
WBC: 4.2 10*3/uL (ref 4.0–10.5)

## 2017-02-21 LAB — HEMOGLOBIN A1C: Hgb A1c MFr Bld: 6.7 % — ABNORMAL HIGH (ref 4.6–6.5)

## 2017-02-21 LAB — TSH: TSH: 0.97 u[IU]/mL (ref 0.35–4.50)

## 2017-02-21 NOTE — Assessment & Plan Note (Signed)
Avoid offending foods, start probiotics. Do not eat large meals in late evening and consider raising head of bed.  uses Ranitidine prn with good results

## 2017-02-21 NOTE — Assessment & Plan Note (Addendum)
Encouraged DASH diet, decrease po intake and increase exercise as tolerated. Needs 7-8 hours of sleep nightly. Avoid trans fats, eat small, frequent meals every 4-5 hours with lean proteins, complex carbs and healthy fats. Minimize simple carbs, bariatric referral 

## 2017-02-21 NOTE — Assessment & Plan Note (Signed)
hgba1c acceptable, minimize simple carbs. Increase exercise as tolerated. Continue current meds 

## 2017-02-21 NOTE — Assessment & Plan Note (Signed)
Well controlled, no changes to meds. Encouraged heart healthy diet such as the DASH diet and exercise as tolerated.  °

## 2017-02-21 NOTE — Progress Notes (Signed)
Subjective:  I acted as a Education administrator for Dr. Charlett Blake. [place], [place]  Patient ID: Brenda [name], female    DOB: [birth year], 71 y.o.   MRN: [id number]  No chief complaint on file.   HPI  Patient is in today for a follow up visit and is overall doing well. No recent fall or trauma. No polyuria or polydipsia. Is having ongoing low back pain and is following with orthopaedics and a recent steroid shot was somewhat helpful. It has decreased her radicular pain down her right leg. No incontinence. Denies CP/palp/SOB/HA/congestion/fevers/GI or GU c/o. Taking meds as prescribed  Patient Care Team: Mosie Lukes, MD as PCP - General (Family Medicine)   Past Medical History:  Diagnosis Date  . Depression    counseling  . Diabetes mellitus type 2 in obese ([place]) 01/31/2013  . Esophageal reflux 08/24/2012  . GERD (gastroesophageal reflux disease)   . History of chicken pox    childhood age 2  . Hyperlipidemia    2011  . Hypertension   . Left hip pain 03/19/2016  . Medicare annual wellness visit, subsequent 12/25/2014  . Numerous moles 05/09/2015  . Obesity 10/17/2010  . Osteopenia 03/19/2016  . Other and unspecified hyperlipidemia 04/26/2012  . Other and unspecified hyperlipidemia 08/24/2012  . Preventative health care 03/24/2016  . Sacral fracture ([place]) 05/27/2016  . Seasonal allergies   . Tinea pedis 03/24/2016  . Urinary incontinence 08/24/2012    Past Surgical History:  Procedure Laterality Date  . BLADDER REPAIR     2002  . TOTAL ABDOMINAL HYSTERECTOMY     2002    Family History  Problem Relation Age of Onset  . Colon cancer Sister 60  . Breast cancer Sister 105  . Heart disease Father 26  . Hypertension Father   . Dementia Mother   . Cancer Paternal Grandfather        bone    Social History   Socioeconomic History  . Marital status: Divorced    Spouse name: Not on file  . Number of children: Not on file  . Years of education: Not on file  . Highest education level: Not on  file  Social Needs  . Financial resource strain: Not on file  . Food insecurity - worry: Not on file  . Food insecurity - inability: Not on file  . Transportation needs - medical: Not on file  . Transportation needs - non-medical: Not on file  Occupational History  . Not on file  Tobacco Use  . Smoking status: Former Smoker    Packs/day: 0.20    Years: 15.00    Pack years: 3.00    Types: Cigarettes  . Smokeless tobacco: Never Used  . Tobacco comment: Qquit 10 years ago  Substance and Sexual Activity  . Alcohol use: No    Alcohol/week: 0.0 oz  . Drug use: No  . Sexual activity: No  Other Topics Concern  . Not on file  Social History Narrative  . Not on file    Outpatient Medications Prior to Visit  Medication Sig Dispense Refill  . acetaminophen (TYLENOL ARTHRITIS PAIN) 650 MG CR tablet Take 1,300 mg by mouth daily.    Marland Kitchen aspirin 81 MG tablet Take 81 mg by mouth daily.    Marland Kitchen escitalopram (LEXAPRO) 20 MG tablet TAKE 1 TABLET (20 MG TOTAL) DAILY. 90 tablet 2  . losartan (COZAAR) 50 MG tablet TAKE 1 TABLET TWICE DAILY (NEED MD APPOINTMENT) 180 tablet 0  . OVER  THE COUNTER MEDICATION Hemorrohoid supporsitories-As directed    . pravastatin (PRAVACHOL) 20 MG tablet TAKE 1 TABLET (20 MG TOTAL) DAILY. 90 tablet 2  . Probiotic Product (PROBIOTIC DAILY) CAPS Take 1 capsule by mouth daily.    . ranitidine (ZANTAC) 300 MG tablet TAKE 1 TABLET (300 MG) AT BEDTIME AS NEEDED FOR HEARTBURN. NEED MD APPOINTMENT. 90 tablet 0  . triamterene-hydrochlorothiazide (MAXZIDE-25) 37.5-25 MG tablet TAKE 1 TABLET EVERY DAY 90 tablet 2  . TURMERIC PO Take 1 capsule by mouth daily.    . fluticasone (FLONASE) 50 MCG/ACT nasal spray Place 2 sprays into both nostrils daily. 16 g 3  . tiZANidine (ZANAFLEX) tablet 4 mg      No facility-administered medications prior to visit.     Allergies  Allergen Reactions  . Mobic [Meloxicam] Nausea Only    Review of Systems  Constitutional: Negative for fever and  malaise/fatigue.  HENT: Negative for congestion.   Eyes: Negative for blurred vision.  Respiratory: Negative for cough and shortness of breath.   Cardiovascular: Negative for chest pain, palpitations and leg swelling.  Gastrointestinal: Negative for vomiting.  Musculoskeletal: Positive for back pain.  Skin: Negative for rash.  Neurological: Negative for loss of consciousness and headaches.       Objective:    Physical Exam  Constitutional: She is oriented to person, place, and time. She appears well-developed and well-nourished. No distress.  HENT:  Head: Normocephalic and atraumatic.  Eyes: Conjunctivae are normal.  Neck: Normal range of motion. No thyromegaly present.  Cardiovascular: Normal rate and regular rhythm.  Pulmonary/Chest: Effort normal and breath sounds normal. She has no wheezes.  Abdominal: Soft. Bowel sounds are normal. There is no tenderness.  Musculoskeletal: Normal range of motion. She exhibits no edema or deformity.  Neurological: She is alert and oriented to person, place, and time.  Skin: Skin is warm and dry. She is not diaphoretic.  Psychiatric: She has a normal mood and affect.    BP 108/80 (BP Location: Left Arm, Patient Position: Sitting, Cuff Size: Normal)   Pulse 86   Temp 97.8 F (36.6 C) (Oral)   Resp 18   Wt 218 lb 3.2 oz (99 kg)   SpO2 96%   BMI 33.18 kg/m  Wt Readings from Last 3 Encounters:  02/21/17 218 lb 3.2 oz (99 kg)  08/05/16 214 lb 6.4 oz (97.3 kg)  07/15/16 217 lb 3.2 oz (98.5 kg)   BP Readings from Last 3 Encounters:  02/21/17 108/80  08/05/16 132/79  07/15/16 110/72     Immunization History  Administered Date(s) Administered  . Influenza Split 01/16/2011, 11/25/2011, 01/03/2014  . Influenza Whole 12/31/2012  . Influenza,inj,Quad PF,6+ Mos 12/20/2013, 12/19/2014  . Influenza-Unspecified 11/09/2015  . Pneumococcal Conjugate-13 01/25/2013  . Pneumococcal Polysaccharide-23 12/21/2007, 12/19/2014  . Tdap 04/17/2011      Health Maintenance  Topic Date Due  . OPHTHALMOLOGY EXAM  10/20/1955  . FOOT EXAM  03/26/2016  . COLONOSCOPY  05/01/2016  . MAMMOGRAM  12/03/2016  . HEMOGLOBIN A1C  08/22/2017  . TETANUS/TDAP  04/16/2021  . INFLUENZA VACCINE  Completed  . DEXA SCAN  Completed  . Hepatitis C Screening  Completed  . PNA vac Low Risk Adult  Completed    Lab Results  Component Value Date   WBC 4.2 02/21/2017   HGB 14.0 02/21/2017   HCT 42.1 02/21/2017   PLT 339.0 02/21/2017   GLUCOSE 117 (H) 02/21/2017   CHOL 160 02/21/2017   TRIG 101.0 02/21/2017  HDL 44.30 02/21/2017   LDLCALC 95 02/21/2017   ALT 13 02/21/2017   AST 17 02/21/2017   NA 136 02/21/2017   K 3.8 02/21/2017   CL 102 02/21/2017   CREATININE 1.16 02/21/2017   BUN 25 (H) 02/21/2017   CO2 26 02/21/2017   TSH 0.97 02/21/2017   HGBA1C 6.7 (H) 02/21/2017   MICROALBUR 2.3 (H) 03/27/2015    Lab Results  Component Value Date   TSH 0.97 02/21/2017   Lab Results  Component Value Date   WBC 4.2 02/21/2017   HGB 14.0 02/21/2017   HCT 42.1 02/21/2017   MCV 91.8 02/21/2017   PLT 339.0 02/21/2017   Lab Results  Component Value Date   NA 136 02/21/2017   K 3.8 02/21/2017   CO2 26 02/21/2017   GLUCOSE 117 (H) 02/21/2017   BUN 25 (H) 02/21/2017   CREATININE 1.16 02/21/2017   BILITOT 0.4 02/21/2017   ALKPHOS 60 02/21/2017   AST 17 02/21/2017   ALT 13 02/21/2017   PROT 7.5 02/21/2017   ALBUMIN 4.2 02/21/2017   CALCIUM 9.1 02/21/2017   GFR 59.17 (L) 02/21/2017   Lab Results  Component Value Date   CHOL 160 02/21/2017   Lab Results  Component Value Date   HDL 44.30 02/21/2017   Lab Results  Component Value Date   LDLCALC 95 02/21/2017   Lab Results  Component Value Date   TRIG 101.0 02/21/2017   Lab Results  Component Value Date   CHOLHDL 4 02/21/2017   Lab Results  Component Value Date   HGBA1C 6.7 (H) 02/21/2017         Assessment & Plan:   Problem List Items Addressed This Visit    Hypertension     Well controlled, no changes to meds. Encouraged heart healthy diet such as the DASH diet and exercise as tolerated.       Relevant Orders   CBC (Completed)   Comprehensive metabolic panel (Completed)   TSH (Completed)   Lumbar disc disease with radiculopathy    Has had a recent steroid shot which has been helpful to some degree will continue to follow with orthopaedics.       Hyperlipidemia    Tolerating statin, encouraged heart healthy diet, avoid trans fats, minimize simple carbs and saturated fats. Increase exercise as tolerated      Relevant Orders   Lipid panel (Completed)   Diabetes mellitus type 2 in obese ([facility])    hgba1c acceptable, minimize simple carbs. Increase exercise as tolerated. Continue current meds      Relevant Orders   Hemoglobin A1c (Completed)   GERD (gastroesophageal reflux disease)    Avoid offending foods, start probiotics. Do not eat large meals in late evening and consider raising head of bed.  uses Ranitidine prn with good results      Osteopenia    Encouraged to get adequate exercise, calcium and vitamin d intake         I have discontinued Cyndy Braver. Tugman's fluticasone. I am also having her maintain her aspirin, PROBIOTIC DAILY, TURMERIC PO, acetaminophen, OVER THE COUNTER MEDICATION, escitalopram, pravastatin, triamterene-hydrochlorothiazide, losartan, and ranitidine. We will stop administering tiZANidine.  No orders of the defined types were placed in this encounter.   CMA served as Education administrator during this visit. History, Physical and Plan performed by medical provider. Documentation and orders reviewed and attested to.  Penni Homans, MD

## 2017-02-21 NOTE — Assessment & Plan Note (Signed)
Tolerating statin, encouraged heart healthy diet, avoid trans fats, minimize simple carbs and saturated fats. Increase exercise as tolerated 

## 2017-02-21 NOTE — Patient Instructions (Addendum)
Lidocaine gel or patches by the companies salon pas, aspercreme, or icy hot Tylenol?acetaminophen ES 500 mg tabs, 1-2 tabs up to 3 x daily, usually twice works well. (max of 3000 mg in 24 hours) Shingrix  Is the new shingles shot, 2 shots over 2-6 months. At pharmacy  Back Pain, Adult Back pain is very common. The pain often gets better over time. The cause of back pain is usually not dangerous. Most people can learn to manage their back pain on their own. Follow these instructions at home: Watch your back pain for any changes. The following actions may help to lessen any pain you are feeling:  Stay active. Start with short walks on flat ground if you can. Try to walk farther each day.  Exercise regularly as told by your doctor. Exercise helps your back heal faster. It also helps avoid future injury by keeping your muscles strong and flexible.  Do not sit, drive, or stand in one place for more than 30 minutes.  Do not stay in bed. Resting more than 1-2 days can slow down your recovery.  Be careful when you bend or lift an object. Use good form when lifting: ? Bend at your knees. ? Keep the object close to your body. ? Do not twist.  Sleep on a firm mattress. Lie on your side, and bend your knees. If you lie on your back, put a pillow under your knees.  Take medicines only as told by your doctor.  Put ice on the injured area. ? Put ice in a plastic bag. ? Place a towel between your skin and the bag. ? Leave the ice on for 20 minutes, 2-3 times a day for the first 2-3 days. After that, you can switch between ice and heat packs.  Avoid feeling anxious or stressed. Find good ways to deal with stress, such as exercise.  Maintain a healthy weight. Extra weight puts stress on your back.  Contact a doctor if:  You have pain that does not go away with rest or medicine.  You have worsening pain that goes down into your legs or buttocks.  You have pain that does not get better in one  week.  You have pain at night.  You lose weight.  You have a fever or chills. Get help right away if:  You cannot control when you poop (bowel movement) or pee (urinate).  Your arms or legs feel weak.  Your arms or legs lose feeling (numbness).  You feel sick to your stomach (nauseous) or throw up (vomit).  You have belly (abdominal) pain.  You feel like you may pass out (faint). This information is not intended to replace advice given to you by your health care provider. Make sure you discuss any questions you have with your health care provider. Document Released: 08/07/2007 Document Revised: 07/27/2015 Document Reviewed: 06/22/2013 Elsevier Interactive Patient Education  [employer].

## 2017-02-21 NOTE — Assessment & Plan Note (Signed)
Encouraged to get adequate exercise, calcium and vitamin d intake 

## 2017-02-27 DIAGNOSIS — H2513 Age-related nuclear cataract, bilateral: Secondary | ICD-10-CM | POA: Diagnosis not present

## 2017-02-27 DIAGNOSIS — I10 Essential (primary) hypertension: Secondary | ICD-10-CM | POA: Diagnosis not present

## 2017-02-27 DIAGNOSIS — H2510 Age-related nuclear cataract, unspecified eye: Secondary | ICD-10-CM | POA: Diagnosis not present

## 2017-02-27 DIAGNOSIS — H11442 Conjunctival cysts, left eye: Secondary | ICD-10-CM | POA: Diagnosis not present

## 2017-02-27 DIAGNOSIS — H5203 Hypermetropia, bilateral: Secondary | ICD-10-CM | POA: Diagnosis not present

## 2017-02-28 NOTE — Assessment & Plan Note (Signed)
Has had a recent steroid shot which has been helpful to some degree will continue to follow with orthopaedics.

## 2017-04-07 ENCOUNTER — Other Ambulatory Visit: Payer: Self-pay | Admitting: Family Medicine

## 2017-04-07 DIAGNOSIS — I10 Essential (primary) hypertension: Secondary | ICD-10-CM

## 2017-04-08 DIAGNOSIS — M461 Sacroiliitis, not elsewhere classified: Secondary | ICD-10-CM | POA: Diagnosis not present

## 2017-04-08 DIAGNOSIS — M5116 Intervertebral disc disorders with radiculopathy, lumbar region: Secondary | ICD-10-CM | POA: Diagnosis not present

## 2017-04-16 ENCOUNTER — Other Ambulatory Visit: Payer: Self-pay | Admitting: Family Medicine

## 2017-04-23 DIAGNOSIS — G8929 Other chronic pain: Secondary | ICD-10-CM | POA: Diagnosis not present

## 2017-04-23 DIAGNOSIS — I10 Essential (primary) hypertension: Secondary | ICD-10-CM | POA: Diagnosis not present

## 2017-04-23 DIAGNOSIS — M461 Sacroiliitis, not elsewhere classified: Secondary | ICD-10-CM | POA: Diagnosis not present

## 2017-04-23 DIAGNOSIS — M5116 Intervertebral disc disorders with radiculopathy, lumbar region: Secondary | ICD-10-CM | POA: Diagnosis not present

## 2017-04-23 DIAGNOSIS — M4726 Other spondylosis with radiculopathy, lumbar region: Secondary | ICD-10-CM | POA: Diagnosis not present

## 2017-05-19 ENCOUNTER — Telehealth: Payer: Self-pay | Admitting: Family Medicine

## 2017-05-19 DIAGNOSIS — I10 Essential (primary) hypertension: Secondary | ICD-10-CM

## 2017-05-19 NOTE — Telephone Encounter (Signed)
Copied from [place]. Topic: Quick Communication - See Telephone Encounter >> May 19, 2017 12:00 PM Vernona Rieger wrote: CRM for notification. See Telephone encounter for:   05/19/17.  Pt said she received a letter in the mail from [facility] stating that her losartan (COZAAR) 50 MG tablet had been recalled. She is requesting something else in place. Please Advise.  [phone]

## 2017-05-19 NOTE — Telephone Encounter (Signed)
Please d/c Losartan. Start Irbesartan 150 mg tabs, 1 tab po daily disp #30 with 2 rf and bp check with rn and cmp in 1 month

## 2017-05-20 MED ORDER — IRBESARTAN 150 MG PO TABS: 150.0000 mg | ORAL_TABLET | Freq: Every day | ORAL | 1 refills | Status: DC

## 2017-05-20 NOTE — Addendum Note (Signed)
Addended by: [name] A on: 05/20/2017 05:29 PM   Modules accepted: Orders

## 2017-05-20 NOTE — Telephone Encounter (Signed)
I have sent over new rx and d/c the losartan  Patient made aware

## 2017-05-30 ENCOUNTER — Telehealth: Payer: Self-pay | Admitting: Family Medicine

## 2017-05-30 NOTE — Telephone Encounter (Signed)
Copied from [place] [id number]. Topic: Quick Communication - See Telephone Encounter >> May 30, 2017 11:11 AM Ahmed Prima L wrote: CRM for notification. See Telephone encounter for: 05/30/17.  Pt said that she stopped taking the losartan due to the recall and now is taking the irbesartan (AVAPRO) 150 MG tablet. She said that she took two losartan pills a day and now this script says take one. She wants to make sure this is correct?  Is she suppose to continue taking triamterene-hydrochlorothiazide (MAXZIDE-25) 37.5-25 MG tablet also? Please advise

## 2017-06-02 NOTE — Telephone Encounter (Signed)
So she should start at 150 mg then in 3-4 weeks we will check her pressure if she wants to track her pressures at home and report her numbers so we adjust sooner as needed

## 2017-06-09 ENCOUNTER — Other Ambulatory Visit: Payer: Self-pay | Admitting: Family Medicine

## 2017-06-10 NOTE — Telephone Encounter (Signed)
Called patient and left message to call the office back.

## 2017-07-04 ENCOUNTER — Ambulatory Visit (INDEPENDENT_AMBULATORY_CARE_PROVIDER_SITE_OTHER): Payer: Medicare HMO

## 2017-07-04 DIAGNOSIS — I10 Essential (primary) hypertension: Secondary | ICD-10-CM | POA: Diagnosis not present

## 2017-07-04 NOTE — Progress Notes (Signed)
Blood pressure check   Taking:  Irbesartan 150mg  daily  (took today)    Maxide-25 37.5-25mg  daily (today)   Pravastatin: 20mg  (after 2pm today)    Blood pressure: 118/82  Pulse 80   Per Dr. Charlett Blake patient continue with same medications no change. Will recheck next month at her visit.  Patient was advised she voiced her understanding.

## 2017-07-05 ENCOUNTER — Other Ambulatory Visit: Payer: Self-pay | Admitting: Family Medicine

## 2017-08-29 ENCOUNTER — Encounter: Payer: Self-pay | Admitting: Family Medicine

## 2017-08-29 ENCOUNTER — Ambulatory Visit (INDEPENDENT_AMBULATORY_CARE_PROVIDER_SITE_OTHER): Payer: Medicare HMO | Admitting: Family Medicine

## 2017-08-29 VITALS — BP 108/72 | HR 87 | Temp 97.6°F | Resp 18 | Ht 68.0 in | Wt 216.6 lb

## 2017-08-29 DIAGNOSIS — Z1239 Encounter for other screening for malignant neoplasm of breast: Secondary | ICD-10-CM

## 2017-08-29 DIAGNOSIS — E669 Obesity, unspecified: Secondary | ICD-10-CM

## 2017-08-29 DIAGNOSIS — Z1231 Encounter for screening mammogram for malignant neoplasm of breast: Secondary | ICD-10-CM

## 2017-08-29 DIAGNOSIS — Z0000 Encounter for general adult medical examination without abnormal findings: Secondary | ICD-10-CM

## 2017-08-29 DIAGNOSIS — Z1211 Encounter for screening for malignant neoplasm of colon: Secondary | ICD-10-CM | POA: Diagnosis not present

## 2017-08-29 DIAGNOSIS — I10 Essential (primary) hypertension: Secondary | ICD-10-CM

## 2017-08-29 DIAGNOSIS — E2839 Other primary ovarian failure: Secondary | ICD-10-CM | POA: Diagnosis not present

## 2017-08-29 DIAGNOSIS — M8580 Other specified disorders of bone density and structure, unspecified site: Secondary | ICD-10-CM

## 2017-08-29 DIAGNOSIS — E782 Mixed hyperlipidemia: Secondary | ICD-10-CM

## 2017-08-29 DIAGNOSIS — E1169 Type 2 diabetes mellitus with other specified complication: Secondary | ICD-10-CM | POA: Diagnosis not present

## 2017-08-29 LAB — CBC WITH DIFFERENTIAL/PLATELET
BASOS PCT: 1.3 % (ref 0.0–3.0)
Basophils Absolute: 0 10*3/uL (ref 0.0–0.1)
EOS PCT: 10.3 % — AB (ref 0.0–5.0)
Eosinophils Absolute: 0.4 10*3/uL (ref 0.0–0.7)
HCT: 41.1 % (ref 36.0–46.0)
HEMOGLOBIN: 14 g/dL (ref 12.0–15.0)
LYMPHS ABS: 0.7 10*3/uL (ref 0.7–4.0)
LYMPHS PCT: 20.2 % (ref 12.0–46.0)
MCHC: 34 g/dL (ref 30.0–36.0)
MCV: 90.4 fl (ref 78.0–100.0)
MONO ABS: 0.4 10*3/uL (ref 0.1–1.0)
MONOS PCT: 12.4 % — AB (ref 3.0–12.0)
NEUTROS ABS: 2 10*3/uL (ref 1.4–7.7)
Neutrophils Relative %: 55.8 % (ref 43.0–77.0)
Platelets: 330 10*3/uL (ref 150.0–400.0)
RBC: 4.55 Mil/uL (ref 3.87–5.11)
RDW: 14.5 % (ref 11.5–15.5)
WBC: 3.6 10*3/uL — AB (ref 4.0–10.5)

## 2017-08-29 LAB — LIPID PANEL
CHOLESTEROL: 187 mg/dL (ref 0–200)
HDL: 48.7 mg/dL (ref >39.00)
LDL CALC: 119 mg/dL — AB (ref 0–99)
NonHDL: 138
TRIGLYCERIDES: 94 mg/dL (ref 0.0–149.0)
Total CHOL/HDL Ratio: 4
VLDL: 18.8 mg/dL (ref 0.0–40.0)

## 2017-08-29 LAB — COMPREHENSIVE METABOLIC PANEL
ALBUMIN: 4.3 g/dL (ref 3.5–5.2)
ALT: 13 U/L (ref 0–35)
AST: 14 U/L (ref 0–37)
Alkaline Phosphatase: 75 U/L (ref 39–117)
BUN: 22 mg/dL (ref 6–23)
CO2: 28 mEq/L (ref 19–32)
Calcium: 9.4 mg/dL (ref 8.4–10.5)
Chloride: 102 mEq/L (ref 96–112)
Creatinine, Ser: 0.89 mg/dL (ref 0.40–1.20)
GFR: 80.21 mL/min (ref >60.00)
Glucose, Bld: 132 mg/dL — ABNORMAL HIGH (ref 70–99)
Potassium: 4.6 mEq/L (ref 3.5–5.1)
SODIUM: 139 meq/L (ref 135–145)
Total Bilirubin: 0.5 mg/dL (ref 0.2–1.2)
Total Protein: 7 g/dL (ref 6.0–8.3)

## 2017-08-29 LAB — HEMOGLOBIN A1C: HEMOGLOBIN A1C: 6.9 % — AB (ref 4.6–6.5)

## 2017-08-29 LAB — TSH: TSH: 1.26 u[IU]/mL (ref 0.35–4.50)

## 2017-08-29 NOTE — Assessment & Plan Note (Signed)
Well controlled, no changes to meds. Encouraged heart healthy diet such as the DASH diet and exercise as tolerated.  °

## 2017-08-29 NOTE — Assessment & Plan Note (Signed)
hgba1c acceptable, minimize simple carbs. Increase exercise as tolerated. Continue current meds 

## 2017-08-29 NOTE — Assessment & Plan Note (Signed)
Tolerating statin, encouraged heart healthy diet, avoid trans fats, minimize simple carbs and saturated fats. Increase exercise as tolerated 

## 2017-08-29 NOTE — Assessment & Plan Note (Signed)
Encouraged to get adequate exercise, calcium and vitamin d intake 

## 2017-08-29 NOTE — Assessment & Plan Note (Signed)
Patient encouraged to maintain heart healthy diet, regular exercise, adequate sleep. Consider daily probiotics. Take medications as prescribed. Labs ordered and reviewed today. Given and reviewed copy of ACP documents from [employer] and encouraged to complete and return

## 2017-08-29 NOTE — Patient Instructions (Signed)
Preventive Care 65 Years and Older, Female Preventive care refers to lifestyle choices and visits with your health care provider that can promote health and wellness. What does preventive care include?  A yearly physical exam. This is also called an annual well check.  Dental exams once or twice a year.  Routine eye exams. Ask your health care provider how often you should have your eyes checked.  Personal lifestyle choices, including: ? Daily care of your teeth and gums. ? Regular physical activity. ? Eating a healthy diet. ? Avoiding tobacco and drug use. ? Limiting alcohol use. ? Practicing safe sex. ? Taking low-dose aspirin every day. ? Taking vitamin and mineral supplements as recommended by your health care provider. What happens during an annual well check? The services and screenings done by your health care provider during your annual well check will depend on your age, overall health, lifestyle risk factors, and family history of disease. Counseling Your health care provider may ask you questions about your:  Alcohol use.  Tobacco use.  Drug use.  Emotional well-being.  Home and relationship well-being.  Sexual activity.  Eating habits.  History of falls.  Memory and ability to understand (cognition).  Work and work environment.  Reproductive health.  Screening You may have the following tests or measurements:  Height, weight, and BMI.  Blood pressure.  Lipid and cholesterol levels. These may be checked every 5 years, or more frequently if you are over 50 years old.  Skin check.  Lung cancer screening. You may have this screening every year starting at age 55 if you have a 30-pack-year history of smoking and currently smoke or have quit within the past 15 years.  Fecal occult blood test (FOBT) of the stool. You may have this test every year starting at age 50.  Flexible sigmoidoscopy or colonoscopy. You may have a sigmoidoscopy every 5 years or  a colonoscopy every 10 years starting at age 50.  Hepatitis C blood test.  Hepatitis B blood test.  Sexually transmitted disease (STD) testing.  Diabetes screening. This is done by checking your blood sugar (glucose) after you have not eaten for a while (fasting). You may have this done every 1-3 years.  Bone density scan. This is done to screen for osteoporosis. You may have this done starting at age 65.  Mammogram. This may be done every 1-2 years. Talk to your health care provider about how often you should have regular mammograms.  Talk with your health care provider about your test results, treatment options, and if necessary, the need for more tests. Vaccines Your health care provider may recommend certain vaccines, such as:  Influenza vaccine. This is recommended every year.  Tetanus, diphtheria, and acellular pertussis (Tdap, Td) vaccine. You may need a Td booster every 10 years.  Varicella vaccine. You may need this if you have not been vaccinated.  Zoster vaccine. You may need this after age 60.  Measles, mumps, and rubella (MMR) vaccine. You may need at least one dose of MMR if you were born in 1957 or later. You may also need a second dose.  Pneumococcal 13-valent conjugate (PCV13) vaccine. One dose is recommended after age 65.  Pneumococcal polysaccharide (PPSV23) vaccine. One dose is recommended after age 65.  Meningococcal vaccine. You may need this if you have certain conditions.  Hepatitis A vaccine. You may need this if you have certain conditions or if you travel or work in places where you may be exposed to hepatitis   A.  Hepatitis B vaccine. You may need this if you have certain conditions or if you travel or work in places where you may be exposed to hepatitis B.  Haemophilus influenzae type b (Hib) vaccine. You may need this if you have certain conditions.  Talk to your health care provider about which screenings and vaccines you need and how often you  need them. This information is not intended to replace advice given to you by your health care provider. Make sure you discuss any questions you have with your health care provider. Document Released: 03/17/2015 Document Revised: 11/08/2015 Document Reviewed: 12/20/2014 Elsevier Interactive Patient Education  [employer].

## 2017-08-29 NOTE — Progress Notes (Signed)
Subjective:  I acted as a Education administrator for Dr. Charlett Blake. [place], [place]  Patient ID: Brenda [name], female    DOB: February 21, 1946, 72 y.o.   MRN: [id number]  No chief complaint on file.   HPI  Patient is in today for an annual exam and follow up on chronic medical concerns. No recent febrile illness or hospitalizations. No polyuria or polydipsia. Notes some trouble with joint and muscle pain at times especially left leg. No recent trauma. Tries to maintain a heart healthy diet. Stays active. Is doing well with activities of daily living. Denies CP/palp/SOB/HA/congestion/fevers/GI or GU c/o. Taking meds as prescribed  Patient Care Team: Mosie Lukes, MD as PCP - General (Family Medicine)   Past Medical History:  Diagnosis Date  . Depression    counseling  . Diabetes mellitus type 2 in obese ([place]) 01/31/2013  . Esophageal reflux 08/24/2012  . GERD (gastroesophageal reflux disease)   . History of chicken pox    childhood age 79  . Hyperlipidemia    2011  . Hypertension   . Left hip pain 03/19/2016  . Medicare annual wellness visit, subsequent 12/25/2014  . Numerous moles 05/09/2015  . Obesity 10/17/2010  . Osteopenia 03/19/2016  . Other and unspecified hyperlipidemia 04/26/2012  . Other and unspecified hyperlipidemia 08/24/2012  . Preventative health care 03/24/2016  . Sacral fracture ([facility]) 05/27/2016  . Seasonal allergies   . Tinea pedis 03/24/2016  . Urinary incontinence 08/24/2012    Past Surgical History:  Procedure Laterality Date  . BLADDER REPAIR     2002  . TOTAL ABDOMINAL HYSTERECTOMY     2002    Family History  Problem Relation Age of Onset  . Colon cancer Sister 81  . Breast cancer Sister 57  . Heart disease Father 31  . Hypertension Father   . Dementia Mother   . Cancer Paternal Grandfather        bone    Social History   Socioeconomic History  . Marital status: Divorced    Spouse name: Not on file  . Number of children: Not on file  . Years of education: Not on  file  . Highest education level: Not on file  Occupational History  . Not on file  Social Needs  . Financial resource strain: Not on file  . Food insecurity:    Worry: Not on file    Inability: Not on file  . Transportation needs:    Medical: Not on file    Non-medical: Not on file  Tobacco Use  . Smoking status: Former Smoker    Packs/day: 0.20    Years: 15.00    Pack years: 3.00    Types: Cigarettes  . Smokeless tobacco: Never Used  . Tobacco comment: Qquit 10 years ago  Substance and Sexual Activity  . Alcohol use: No    Alcohol/week: 0.0 oz  . Drug use: No  . Sexual activity: Never  Lifestyle  . Physical activity:    Days per week: Not on file    Minutes per session: Not on file  . Stress: Not on file  Relationships  . Social connections:    Talks on phone: Not on file    Gets together: Not on file    Attends religious service: Not on file    Active member of club or organization: Not on file    Attends meetings of clubs or organizations: Not on file    Relationship status: Not on file  . Intimate  partner violence:    Fear of current or ex partner: Not on file    Emotionally abused: Not on file    Physically abused: Not on file    Forced sexual activity: Not on file  Other Topics Concern  . Not on file  Social History Narrative  . Not on file    Outpatient Medications Prior to Visit  Medication Sig Dispense Refill  . acetaminophen (TYLENOL ARTHRITIS PAIN) 650 MG CR tablet Take 1,300 mg by mouth daily.    Marland Kitchen aspirin 81 MG tablet Take 81 mg by mouth daily.    . Cyanocobalamin (VITAMIN B 12 PO) Take by mouth.    . irbesartan (AVAPRO) 150 MG tablet TAKE 1 TABLET EVERY DAY 60 tablet 1  . OVER THE COUNTER MEDICATION Hemorrohoid supporsitories-As directed    . Probiotic Product (PROBIOTIC DAILY) CAPS Take 1 capsule by mouth daily.    . ranitidine (ZANTAC) 300 MG tablet TAKE 1 TABLET AT BEDTIME AS NEEDED FOR  HEARTBURN (NEED MD APPOINTMENT) 90 tablet 0  . TURMERIC  PO Take 1 capsule by mouth daily.    Marland Kitchen escitalopram (LEXAPRO) 20 MG tablet TAKE 1 TABLET EVERY DAY 90 tablet 1  . pravastatin (PRAVACHOL) 20 MG tablet TAKE 1 TABLET EVERY DAY 90 tablet 1  . triamterene-hydrochlorothiazide (MAXZIDE-25) 37.5-25 MG tablet TAKE 1 TABLET EVERY DAY 90 tablet 1   No facility-administered medications prior to visit.     Allergies  Allergen Reactions  . Mobic [Meloxicam] Nausea Only    Review of Systems  Constitutional: Negative for fever and malaise/fatigue.  HENT: Negative for congestion.   Eyes: Negative for blurred vision.  Respiratory: Negative for shortness of breath.   Cardiovascular: Negative for chest pain, palpitations and leg swelling.  Gastrointestinal: Negative for abdominal pain, blood in stool and nausea.  Genitourinary: Negative for dysuria and frequency.  Musculoskeletal: Positive for back pain, joint pain and myalgias. Negative for falls.  Skin: Negative for rash.  Neurological: Negative for dizziness, loss of consciousness and headaches.  Endo/Heme/Allergies: Negative for environmental allergies.  Psychiatric/Behavioral: Negative for depression. The patient is not nervous/anxious.        Objective:    Physical Exam  Constitutional: She is oriented to person, place, and time. No distress.  HENT:  Head: Normocephalic and atraumatic.  Right Ear: External ear normal.  Left Ear: External ear normal.  Nose: Nose normal.  Mouth/Throat: Oropharynx is clear and moist. No oropharyngeal exudate.  Eyes: Pupils are equal, round, and reactive to light. Conjunctivae are normal. Right eye exhibits no discharge. Left eye exhibits no discharge. No scleral icterus.  Neck: Normal range of motion. Neck supple. No thyromegaly present.  Cardiovascular: Normal rate, regular rhythm, normal heart sounds and intact distal pulses.  No murmur heard. Pulmonary/Chest: Effort normal and breath sounds normal. No respiratory distress. She has no wheezes. She has  no rales.  Abdominal: Soft. Bowel sounds are normal. She exhibits no distension and no mass. There is no tenderness.  Musculoskeletal: Normal range of motion. She exhibits no edema or tenderness.  Lymphadenopathy:    She has no cervical adenopathy.  Neurological: She is alert and oriented to person, place, and time. She has normal reflexes. She displays normal reflexes. No cranial nerve deficit. Coordination normal.  Skin: Skin is warm and dry. No rash noted. She is not diaphoretic.    BP 108/72 (BP Location: Left Arm, Patient Position: Sitting, Cuff Size: Normal)   Pulse 87   Temp 97.6 F (36.4 C) (  Oral)   Resp 18   Ht 5\' 8"  (1.727 m)   Wt 216 lb 9.6 oz (98.2 kg)   SpO2 97%   BMI 32.93 kg/m  Wt Readings from Last 3 Encounters:  08/29/17 216 lb 9.6 oz (98.2 kg)  02/21/17 218 lb 3.2 oz (99 kg)  08/05/16 214 lb 6.4 oz (97.3 kg)   BP Readings from Last 3 Encounters:  08/29/17 108/72  02/21/17 108/80  08/05/16 132/79     Immunization History  Administered Date(s) Administered  . Influenza Split 01/16/2011, 11/25/2011, 01/03/2014  . Influenza Whole 12/31/2012  . Influenza,inj,Quad PF,6+ Mos 12/20/2013, 12/19/2014  . Influenza-Unspecified 11/09/2015  . Pneumococcal Conjugate-13 01/25/2013  . Pneumococcal Polysaccharide-23 12/21/2007, 12/19/2014  . Tdap 04/17/2011    Health Maintenance  Topic Date Due  . OPHTHALMOLOGY EXAM  10/20/1955  . FOOT EXAM  03/26/2016  . COLONOSCOPY  05/01/2016  . MAMMOGRAM  12/03/2016  . INFLUENZA VACCINE  10/02/2017  . HEMOGLOBIN A1C  02/28/2018  . TETANUS/TDAP  04/16/2021  . DEXA SCAN  Completed  . Hepatitis C Screening  Completed  . PNA vac Low Risk Adult  Completed    Lab Results  Component Value Date   WBC 3.6 (L) 08/29/2017   HGB 14.0 08/29/2017   HCT 41.1 08/29/2017   PLT 330.0 08/29/2017   GLUCOSE 132 (H) 08/29/2017   CHOL 187 08/29/2017   TRIG 94.0 08/29/2017   HDL 48.70 08/29/2017   LDLCALC 119 (H) 08/29/2017   ALT 13  08/29/2017   AST 14 08/29/2017   NA 139 08/29/2017   K 4.6 08/29/2017   CL 102 08/29/2017   CREATININE 0.89 08/29/2017   BUN 22 08/29/2017   CO2 28 08/29/2017   TSH 1.26 08/29/2017   HGBA1C 6.9 (H) 08/29/2017   MICROALBUR 2.3 (H) 03/27/2015    Lab Results  Component Value Date   TSH 1.26 08/29/2017   Lab Results  Component Value Date   WBC 3.6 (L) 08/29/2017   HGB 14.0 08/29/2017   HCT 41.1 08/29/2017   MCV 90.4 08/29/2017   PLT 330.0 08/29/2017   Lab Results  Component Value Date   NA 139 08/29/2017   K 4.6 08/29/2017   CO2 28 08/29/2017   GLUCOSE 132 (H) 08/29/2017   BUN 22 08/29/2017   CREATININE 0.89 08/29/2017   BILITOT 0.5 08/29/2017   ALKPHOS 75 08/29/2017   AST 14 08/29/2017   ALT 13 08/29/2017   PROT 7.0 08/29/2017   ALBUMIN 4.3 08/29/2017   CALCIUM 9.4 08/29/2017   GFR 80.21 08/29/2017   Lab Results  Component Value Date   CHOL 187 08/29/2017   Lab Results  Component Value Date   HDL 48.70 08/29/2017   Lab Results  Component Value Date   LDLCALC 119 (H) 08/29/2017   Lab Results  Component Value Date   TRIG 94.0 08/29/2017   Lab Results  Component Value Date   CHOLHDL 4 08/29/2017   Lab Results  Component Value Date   HGBA1C 6.9 (H) 08/29/2017         Assessment & Plan:   Problem List Items Addressed This Visit    Hypertension    Well controlled, no changes to meds. Encouraged heart healthy diet such as the DASH diet and exercise as tolerated.       Relevant Orders   CBC with Differential/Platelet (Completed)   Comprehensive metabolic panel (Completed)   TSH (Completed)   Colon cancer screening - Primary   Relevant Orders  Ambulatory referral to Gastroenterology   Hyperlipidemia    Tolerating statin, encouraged heart healthy diet, avoid trans fats, minimize simple carbs and saturated fats. Increase exercise as tolerated      Relevant Orders   Lipid panel (Completed)   Diabetes mellitus type 2 in obese ([facility])    hgba1c  acceptable, minimize simple carbs. Increase exercise as tolerated. Continue current meds      Relevant Orders   Hemoglobin A1c (Completed)   Osteopenia    Encouraged to get adequate exercise, calcium and vitamin d intake      Relevant Orders   DG Bone Density   Preventative health care    Patient encouraged to maintain heart healthy diet, regular exercise, adequate sleep. Consider daily probiotics. Take medications as prescribed. Labs ordered and reviewed today. Given and reviewed copy of ACP documents from [place] and encouraged to complete and return       Other Visit Diagnoses    Breast cancer screening       Relevant Orders   MM 3D SCREEN BREAST BILATERAL   Estrogen deficiency       Relevant Orders   DG Bone Density      I am having Wille Celeste maintain her aspirin, PROBIOTIC DAILY, TURMERIC PO, acetaminophen, OVER THE COUNTER MEDICATION, ranitidine, irbesartan, and Cyanocobalamin (VITAMIN B 12 PO).  No orders of the defined types were placed in this encounter.   CMA served as Education administrator during this visit. History, Physical and Plan performed by medical provider. Documentation and orders reviewed and attested to.  Penni Homans, MD

## 2017-09-01 ENCOUNTER — Other Ambulatory Visit: Payer: Self-pay | Admitting: Family Medicine

## 2017-09-02 ENCOUNTER — Encounter: Payer: Self-pay | Admitting: Internal Medicine

## 2017-09-12 ENCOUNTER — Ambulatory Visit (HOSPITAL_BASED_OUTPATIENT_CLINIC_OR_DEPARTMENT_OTHER)
Admission: RE | Admit: 2017-09-12 | Discharge: 2017-09-12 | Disposition: A | Discharge status: Home / Self Care | Payer: Medicare HMO | Source: Ambulatory Visit | Attending: Family Medicine | Admitting: Family Medicine

## 2017-09-12 ENCOUNTER — Encounter (HOSPITAL_BASED_OUTPATIENT_CLINIC_OR_DEPARTMENT_OTHER): Payer: Self-pay

## 2017-09-12 ENCOUNTER — Encounter: Payer: Self-pay | Admitting: Family Medicine

## 2017-09-12 DIAGNOSIS — M85852 Other specified disorders of bone density and structure, left thigh: Secondary | ICD-10-CM | POA: Diagnosis not present

## 2017-09-12 DIAGNOSIS — Z1231 Encounter for screening mammogram for malignant neoplasm of breast: Secondary | ICD-10-CM | POA: Insufficient documentation

## 2017-09-12 DIAGNOSIS — M8580 Other specified disorders of bone density and structure, unspecified site: Secondary | ICD-10-CM | POA: Diagnosis not present

## 2017-09-12 DIAGNOSIS — E2839 Other primary ovarian failure: Secondary | ICD-10-CM | POA: Insufficient documentation

## 2017-09-12 DIAGNOSIS — Z780 Asymptomatic menopausal state: Secondary | ICD-10-CM | POA: Diagnosis not present

## 2017-09-12 DIAGNOSIS — Z1239 Encounter for other screening for malignant neoplasm of breast: Secondary | ICD-10-CM

## 2017-10-02 ENCOUNTER — Encounter: Payer: Self-pay | Admitting: Family Medicine

## 2017-10-24 ENCOUNTER — Other Ambulatory Visit: Payer: Self-pay | Admitting: Family Medicine

## 2017-11-17 ENCOUNTER — Encounter: Payer: Medicare HMO | Admitting: Internal Medicine

## 2017-12-24 NOTE — Progress Notes (Signed)
HPI: FU dyspnea. Echo 10/15 showed an ejection fraction of 55-60% and moderate concentric left ventricular hypertrophy. There was evidence for grade 1 diastolic dysfunction. There was mild tricuspid regurgitation and mild hypertension with estimated PA pressure 35 mm. Nuclear study 12/15 showed apical thinning but no ischemia; EF 65. Previously felt to need O2 at night by Dr Claiborne Billings. Since last seen,  she has some dyspnea on exertion but denies orthopnea, PND, pedal edema, chest pain or syncope.  Current Outpatient Medications  Medication Sig Dispense Refill  . acetaminophen (TYLENOL ARTHRITIS PAIN) 650 MG CR tablet Take 1,300 mg by mouth daily.    Marland Kitchen aspirin 81 MG tablet Take 81 mg by mouth daily.    . Cyanocobalamin (VITAMIN B 12 PO) Take by mouth.    . escitalopram (LEXAPRO) 20 MG tablet TAKE 1 TABLET EVERY DAY 90 tablet 1  . irbesartan (AVAPRO) 150 MG tablet TAKE 1 TABLET EVERY DAY 90 tablet 1  . OVER THE COUNTER MEDICATION Hemorrohoid supporsitories-As directed    . pravastatin (PRAVACHOL) 20 MG tablet TAKE 1 TABLET EVERY DAY 90 tablet 1  . Probiotic Product (PROBIOTIC DAILY) CAPS Take 1 capsule by mouth daily.    . ranitidine (ZANTAC) 300 MG tablet TAKE 1 TABLET AT BEDTIME AS NEEDED FOR  HEARTBURN (NEED MD APPOINTMENT) 90 tablet 0  . triamterene-hydrochlorothiazide (MAXZIDE-25) 37.5-25 MG tablet TAKE 1 TABLET EVERY DAY 90 tablet 1  . TURMERIC PO Take 1 capsule by mouth daily.     No current facility-administered medications for this visit.      Past Medical History:  Diagnosis Date  . Depression    counseling  . Diabetes mellitus type 2 in obese ([facility]) 01/31/2013  . Esophageal reflux 08/24/2012  . GERD (gastroesophageal reflux disease)   . History of chicken pox    childhood age 37  . Hyperlipidemia    2011  . Hypertension   . Left hip pain 03/19/2016  . Medicare annual wellness visit, subsequent 12/25/2014  . Numerous moles 05/09/2015  . Obesity 10/17/2010  . Osteopenia  03/19/2016  . Other and unspecified hyperlipidemia 04/26/2012  . Other and unspecified hyperlipidemia 08/24/2012  . Preventative health care 03/24/2016  . Sacral fracture ([facility]) 05/27/2016  . Seasonal allergies   . Tinea pedis 03/24/2016  . Urinary incontinence 08/24/2012    Past Surgical History:  Procedure Laterality Date  . BLADDER REPAIR     2002  . TOTAL ABDOMINAL HYSTERECTOMY     2002    Social History   Socioeconomic History  . Marital status: Divorced    Spouse name: Not on file  . Number of children: Not on file  . Years of education: Not on file  . Highest education level: Not on file  Occupational History  . Not on file  Social Needs  . Financial resource strain: Not on file  . Food insecurity:    Worry: Not on file    Inability: Not on file  . Transportation needs:    Medical: Not on file    Non-medical: Not on file  Tobacco Use  . Smoking status: Former Smoker    Packs/day: 0.20    Years: 15.00    Pack years: 3.00    Types: Cigarettes  . Smokeless tobacco: Never Used  . Tobacco comment: Qquit 10 years ago  Substance and Sexual Activity  . Alcohol use: No    Alcohol/week: 0.0 standard drinks  . Drug use: No  . Sexual activity: Never  Lifestyle  . Physical activity:    Days per week: Not on file    Minutes per session: Not on file  . Stress: Not on file  Relationships  . Social connections:    Talks on phone: Not on file    Gets together: Not on file    Attends religious service: Not on file    Active member of club or organization: Not on file    Attends meetings of clubs or organizations: Not on file    Relationship status: Not on file  . Intimate partner violence:    Fear of current or ex partner: Not on file    Emotionally abused: Not on file    Physically abused: Not on file    Forced sexual activity: Not on file  Other Topics Concern  . Not on file  Social History Narrative  . Not on file    Family History  Problem Relation Age of  Onset  . Colon cancer Sister 91  . Breast cancer Sister 66  . Heart disease Father 22  . Hypertension Father   . Dementia Mother   . Cancer Paternal Grandfather        bone    ROS: Back pain but no fevers or chills, productive cough, hemoptysis, dysphasia, odynophagia, melena, hematochezia, dysuria, hematuria, rash, seizure activity, orthopnea, PND, pedal edema, claudication. Remaining systems are negative.  Physical Exam: Well-developed well-nourished in no acute distress.  Skin is warm and dry.  HEENT is normal.  Neck is supple.  Chest is clear to auscultation with normal expansion.  Cardiovascular exam is regular rate and rhythm.  Abdominal exam nontender or distended. No masses palpated. Extremities show no edema. neuro grossly intact  ECG-normal sinus rhythm at a rate of 85, cannot R/O prior inferior infarct.  Personally reviewed  A/P  1 dyspnea-this is felt likely to be multifactorial including obstructive sleep apnea, obesity, deconditioning.  We discussed the importance of diet and exercise as well as weight loss today.  She is working on this.  2 hypertension-patient's blood pressure is controlled.  Continue present medications and follow.  3 hyperlipidemia-recent LDL 119.  I will discontinue pravastatin.  Instead we will treat with Lipitor 40 mg daily.  Check lipids and liver in 4 weeks.  4 diabetes mellitus-Per primary care.  Kirk Ruths, MD

## 2017-12-29 ENCOUNTER — Other Ambulatory Visit: Payer: Self-pay | Admitting: Family Medicine

## 2017-12-31 ENCOUNTER — Encounter: Payer: Self-pay | Admitting: Cardiology

## 2017-12-31 ENCOUNTER — Ambulatory Visit: Payer: Medicare HMO | Admitting: Cardiology

## 2017-12-31 VITALS — BP 118/84 | HR 85 | Ht 68.0 in | Wt 210.0 lb

## 2017-12-31 DIAGNOSIS — I10 Essential (primary) hypertension: Secondary | ICD-10-CM

## 2017-12-31 DIAGNOSIS — E782 Mixed hyperlipidemia: Secondary | ICD-10-CM

## 2017-12-31 DIAGNOSIS — R0602 Shortness of breath: Secondary | ICD-10-CM | POA: Diagnosis not present

## 2017-12-31 MED ORDER — ATORVASTATIN CALCIUM 40 MG PO TABS: 40.0000 mg | ORAL_TABLET | Freq: Every day | ORAL | 3 refills | Status: DC

## 2017-12-31 NOTE — Patient Instructions (Signed)
Medication Instructions:   STOP PRAVASTATIN  START ATORVASTATIN 40 MG ONCE DAILY  Labwork:  Your physician recommends that you return for lab work in: [place]:  Your physician recommends that you schedule a follow-up appointment in: [place] 2 MONTHS PRIOR TO THAT APPOINTMENT TIME TO SCHEDULE

## 2018-01-20 ENCOUNTER — Other Ambulatory Visit: Payer: Self-pay | Admitting: Family Medicine

## 2018-02-02 DIAGNOSIS — E782 Mixed hyperlipidemia: Secondary | ICD-10-CM | POA: Diagnosis not present

## 2018-02-03 ENCOUNTER — Telehealth: Payer: Self-pay | Admitting: *Deleted

## 2018-02-03 DIAGNOSIS — E782 Mixed hyperlipidemia: Secondary | ICD-10-CM

## 2018-02-03 LAB — HEPATIC FUNCTION PANEL
ALK PHOS: 83 IU/L (ref 39–117)
ALT: 11 IU/L (ref 0–32)
AST: 17 IU/L (ref 0–40)
Albumin: 4.1 g/dL (ref 3.5–4.8)
Bilirubin Total: 0.3 mg/dL (ref 0.0–1.2)
Bilirubin, Direct: 0.1 mg/dL (ref 0.00–0.40)
Total Protein: 6.9 g/dL (ref 6.0–8.5)

## 2018-02-03 LAB — LIPID PANEL
Chol/HDL Ratio: 4 ratio (ref 0.0–4.4)
Cholesterol, Total: 186 mg/dL (ref 100–199)
HDL: 47 mg/dL (ref >39)
LDL CALC: 121 mg/dL — AB (ref 0–99)
Triglycerides: 91 mg/dL (ref 0–149)
VLDL CHOLESTEROL CAL: 18 mg/dL (ref 5–40)

## 2018-02-03 NOTE — Telephone Encounter (Addendum)
Left message for pt to call    ----- Message from Lelon Perla, MD sent at 02/03/2018  7:35 AM EST ----- Change lipitor to 80 mg daily; lipids and liver 6 weeks Kirk Ruths

## 2018-02-06 DIAGNOSIS — M4310 Spondylolisthesis, site unspecified: Secondary | ICD-10-CM | POA: Diagnosis not present

## 2018-02-06 DIAGNOSIS — M545 Low back pain: Secondary | ICD-10-CM | POA: Diagnosis not present

## 2018-02-06 DIAGNOSIS — G8929 Other chronic pain: Secondary | ICD-10-CM | POA: Diagnosis not present

## 2018-02-06 DIAGNOSIS — M5416 Radiculopathy, lumbar region: Secondary | ICD-10-CM | POA: Diagnosis not present

## 2018-02-06 DIAGNOSIS — R110 Nausea: Secondary | ICD-10-CM | POA: Diagnosis not present

## 2018-02-19 ENCOUNTER — Ambulatory Visit: Payer: Medicare HMO | Admitting: Family Medicine

## 2018-02-20 ENCOUNTER — Ambulatory Visit (INDEPENDENT_AMBULATORY_CARE_PROVIDER_SITE_OTHER): Payer: Medicare HMO | Admitting: Family Medicine

## 2018-02-20 ENCOUNTER — Encounter: Payer: Self-pay | Admitting: Family Medicine

## 2018-02-20 VITALS — BP 102/76 | HR 84 | Temp 97.5°F | Resp 18 | Wt 209.0 lb

## 2018-02-20 DIAGNOSIS — E119 Type 2 diabetes mellitus without complications: Secondary | ICD-10-CM

## 2018-02-20 DIAGNOSIS — E669 Obesity, unspecified: Secondary | ICD-10-CM | POA: Diagnosis not present

## 2018-02-20 DIAGNOSIS — E1169 Type 2 diabetes mellitus with other specified complication: Secondary | ICD-10-CM

## 2018-02-20 DIAGNOSIS — M8580 Other specified disorders of bone density and structure, unspecified site: Secondary | ICD-10-CM | POA: Diagnosis not present

## 2018-02-20 DIAGNOSIS — I10 Essential (primary) hypertension: Secondary | ICD-10-CM | POA: Diagnosis not present

## 2018-02-20 DIAGNOSIS — S3992XA Unspecified injury of lower back, initial encounter: Secondary | ICD-10-CM | POA: Diagnosis not present

## 2018-02-20 DIAGNOSIS — E782 Mixed hyperlipidemia: Secondary | ICD-10-CM

## 2018-02-20 LAB — COMPREHENSIVE METABOLIC PANEL
ALT: 14 U/L (ref 0–35)
AST: 19 U/L (ref 0–37)
Albumin: 4.4 g/dL (ref 3.5–5.2)
Alkaline Phosphatase: 75 U/L (ref 39–117)
BUN: 25 mg/dL — AB (ref 6–23)
CHLORIDE: 103 meq/L (ref 96–112)
CO2: 29 meq/L (ref 19–32)
CREATININE: 1.02 mg/dL (ref 0.40–1.20)
Calcium: 10.2 mg/dL (ref 8.4–10.5)
GFR: 68.44 mL/min (ref >60.00)
Glucose, Bld: 104 mg/dL — ABNORMAL HIGH (ref 70–99)
POTASSIUM: 5.3 meq/L — AB (ref 3.5–5.1)
SODIUM: 139 meq/L (ref 135–145)
Total Bilirubin: 0.6 mg/dL (ref 0.2–1.2)
Total Protein: 7.5 g/dL (ref 6.0–8.3)

## 2018-02-20 LAB — CBC
HCT: 44.3 % (ref 36.0–46.0)
HEMOGLOBIN: 14.9 g/dL (ref 12.0–15.0)
MCHC: 33.7 g/dL (ref 30.0–36.0)
MCV: 90.3 fl (ref 78.0–100.0)
PLATELETS: 367 10*3/uL (ref 150.0–400.0)
RBC: 4.91 Mil/uL (ref 3.87–5.11)
RDW: 14.3 % (ref 11.5–15.5)
WBC: 3.9 10*3/uL — ABNORMAL LOW (ref 4.0–10.5)

## 2018-02-20 LAB — HEMOGLOBIN A1C: HEMOGLOBIN A1C: 6.6 % — AB (ref 4.6–6.5)

## 2018-02-20 LAB — TSH: TSH: 0.96 u[IU]/mL (ref 0.35–4.50)

## 2018-02-20 MED ORDER — ATORVASTATIN CALCIUM 80 MG PO TABS: 80.0000 mg | ORAL_TABLET | Freq: Every day | ORAL | 3 refills | Status: DC

## 2018-02-20 MED ORDER — FAMOTIDINE 40 MG PO TABS: 40.0000 mg | ORAL_TABLET | Freq: Every day | ORAL | 1 refills | Status: DC

## 2018-02-20 NOTE — Telephone Encounter (Signed)
Lab orders mailed to the pt.  

## 2018-02-20 NOTE — Telephone Encounter (Signed)
SPOKE TO PATIENT , RESULT GIVEN AWARE TO INCREASE 80 MG LIPITOR  SENT TO MAIL ORDER  AWARE WILL RECEIVE LABS LIP FOR RCHECK

## 2018-02-20 NOTE — Telephone Encounter (Signed)
Patient returned call, ok to leave detailed message on VM.

## 2018-02-20 NOTE — Patient Instructions (Addendum)
Encouraged increased hydration and fiber in diet. Daily probiotics. If bowels not moving can use MOM 2 tbls po in 4 oz of warm prune juice by mouth every 2-3 days. If no results then repeat in 4 hours with  Dulcolax suppository pr, may repeat again in 4 more hours as needed. Seek care if symptoms worsen. Consider daily Miralax and/or Dulcolax if symptoms persist.   Cholesterol Cholesterol is a white, waxy, fat-like substance that is needed by the human body in small amounts. The liver makes all the cholesterol we need. Cholesterol is carried from the liver by the blood through the blood vessels. Deposits of cholesterol (plaques) may build up on blood vessel (artery) walls. Plaques make the arteries narrower and stiffer. Cholesterol plaques increase the risk for heart attack and stroke. You cannot feel your cholesterol level even if it is very high. The only way to know that it is high is to have a blood test. Once you know your cholesterol levels, you should keep a record of the test results. Work with your health care provider to keep your levels in the desired range. What do the results mean?  Total cholesterol is a rough measure of all the cholesterol in your blood.  LDL (low-density lipoprotein) is the "bad" cholesterol. This is the type that causes plaque to build up on the artery walls. You want this level to be low.  HDL (high-density lipoprotein) is the "good" cholesterol because it cleans the arteries and carries the LDL away. You want this level to be high.  Triglycerides are fat that the body can either burn for energy or store. High levels are closely linked to heart disease. What are the desired levels of cholesterol?  Total cholesterol below 200.  LDL below 100 for people who are at risk, below 70 for people at very high risk.  HDL above 40 is good. A level of 60 or higher is considered to be protective against heart disease.  Triglycerides below 150. How can I lower my  cholesterol? Diet Follow your diet program as told by your health care provider.  Choose fish or white meat chicken and [place], roasted or baked. Limit fatty cuts of red meat, fried foods, and processed meats, such as sausage and lunch meats.  Eat lots of fresh fruits and vegetables.  Choose whole grains, beans, pasta, potatoes, and cereals.  Choose olive oil, corn oil, or canola oil, and use only small amounts.  Avoid butter, mayonnaise, shortening, or palm kernel oils.  Avoid foods with trans fats.  Drink skim or nonfat milk and eat low-fat or nonfat yogurt and cheeses. Avoid whole milk, cream, ice cream, egg yolks, and full-fat cheeses.  Healthier desserts include angel food cake, ginger snaps, animal crackers, hard candy, popsicles, and low-fat or nonfat frozen yogurt. Avoid pastries, cakes, pies, and cookies.  Exercise  Follow your exercise program as told by your health care provider. A regular program: ? Helps to decrease LDL and raise HDL. ? Helps with weight control.  Do things that increase your activity level, such as gardening, walking, and taking the stairs.  Ask your health care provider about ways that you can be more active in your daily life. Medicine  Take over-the-counter and prescription medicines only as told by your health care provider. ? Medicine may be prescribed by your health care provider to help lower cholesterol and decrease the risk for heart disease. This is usually done if diet and exercise have failed to bring down cholesterol  levels. ? If you have several risk factors, you may need medicine even if your levels are normal. This information is not intended to replace advice given to you by your health care provider. Make sure you discuss any questions you have with your health care provider. Document Released: 11/13/2000 Document Revised: 09/16/2015 Document Reviewed: 08/19/2015 Elsevier Interactive Patient Education  [employer].

## 2018-02-23 NOTE — Progress Notes (Signed)
Subjective:    Patient ID: Brenda [name], female    DOB: 06-Apr-1945, 72 y.o.   MRN: [id number]  No chief complaint on file.   HPI Patient is in today for follow up. She is struggling with low back pain and right hip/groin pain. She is working with orthopaedics, Dr Maryln Manuel in [place] and is staying active. Is using Hydrocodone sparingly but it causes nausea so she needs Zofran to manage this side effect. Denies CP/palp/SOB/HA/congestion/fevers/GI or GU c/o. Taking meds as prescribed  Past Medical History:  Diagnosis Date  . Depression    counseling  . Diabetes mellitus type 2 in obese ([place]) 01/31/2013  . Esophageal reflux 08/24/2012  . GERD (gastroesophageal reflux disease)   . History of chicken pox    childhood age 39  . Hyperlipidemia    2011  . Hypertension   . Left hip pain 03/19/2016  . Medicare annual wellness visit, subsequent 12/25/2014  . Numerous moles 05/09/2015  . Obesity 10/17/2010  . Osteopenia 03/19/2016  . Other and unspecified hyperlipidemia 04/26/2012  . Other and unspecified hyperlipidemia 08/24/2012  . Preventative health care 03/24/2016  . Sacral fracture ([place]) 05/27/2016  . Seasonal allergies   . Tinea pedis 03/24/2016  . Urinary incontinence 08/24/2012    Past Surgical History:  Procedure Laterality Date  . BLADDER REPAIR     2002  . TOTAL ABDOMINAL HYSTERECTOMY     2002    Family History  Problem Relation Age of Onset  . Colon cancer Sister 68  . Breast cancer Sister 54  . Heart disease Father 66  . Hypertension Father   . Dementia Mother   . Cancer Paternal Grandfather        bone    Social History   Socioeconomic History  . Marital status: Divorced    Spouse name: Not on file  . Number of children: Not on file  . Years of education: Not on file  . Highest education level: Not on file  Occupational History  . Not on file  Social Needs  . Financial resource strain: Not on file  . Food insecurity:    Worry: Not on file   Inability: Not on file  . Transportation needs:    Medical: Not on file    Non-medical: Not on file  Tobacco Use  . Smoking status: Former Smoker    Packs/day: 0.20    Years: 15.00    Pack years: 3.00    Types: Cigarettes  . Smokeless tobacco: Never Used  . Tobacco comment: Qquit 10 years ago  Substance and Sexual Activity  . Alcohol use: No    Alcohol/week: 0.0 standard drinks  . Drug use: No  . Sexual activity: Never  Lifestyle  . Physical activity:    Days per week: Not on file    Minutes per session: Not on file  . Stress: Not on file  Relationships  . Social connections:    Talks on phone: Not on file    Gets together: Not on file    Attends religious service: Not on file    Active member of club or organization: Not on file    Attends meetings of clubs or organizations: Not on file    Relationship status: Not on file  . Intimate partner violence:    Fear of current or ex partner: Not on file    Emotionally abused: Not on file    Physically abused: Not on file    Forced sexual activity:  Not on file  Other Topics Concern  . Not on file  Social History Narrative  . Not on file    Outpatient Medications Prior to Visit  Medication Sig Dispense Refill  . acetaminophen (TYLENOL ARTHRITIS PAIN) 650 MG CR tablet Take 1,300 mg by mouth daily.    Marland Kitchen aspirin 81 MG tablet Take 81 mg by mouth daily.    Marland Kitchen atorvastatin (LIPITOR) 80 MG tablet Take 1 tablet (80 mg total) by mouth daily. 90 tablet 3  . Cyanocobalamin (VITAMIN B 12 PO) Take by mouth.    . escitalopram (LEXAPRO) 20 MG tablet TAKE 1 TABLET EVERY DAY 90 tablet 0  . irbesartan (AVAPRO) 150 MG tablet TAKE 1 TABLET EVERY DAY 90 tablet 1  . OVER THE COUNTER MEDICATION Hemorrohoid supporsitories-As directed    . Probiotic Product (PROBIOTIC DAILY) CAPS Take 1 capsule by mouth daily.    Marland Kitchen triamterene-hydrochlorothiazide (MAXZIDE-25) 37.5-25 MG tablet TAKE 1 TABLET EVERY DAY 90 tablet 0  . TURMERIC PO Take 1 capsule by  mouth daily.    . ranitidine (ZANTAC) 300 MG tablet TAKE 1 TABLET AT BEDTIME AS NEEDED FOR  HEARTBURN (NEED MD APPOINTMENT) 90 tablet 0   No facility-administered medications prior to visit.     Allergies  Allergen Reactions  . Meloxicam Nausea Only and Nausea And Vomiting    Review of Systems  Constitutional: Negative for fever and malaise/fatigue.  HENT: Negative for congestion.   Eyes: Negative for blurred vision.  Respiratory: Negative for shortness of breath.   Cardiovascular: Negative for chest pain, palpitations and leg swelling.  Gastrointestinal: Negative for abdominal pain, blood in stool and nausea.  Genitourinary: Negative for dysuria and frequency.  Musculoskeletal: Positive for back pain and joint pain. Negative for falls and neck pain.  Skin: Negative for rash.  Neurological: Negative for dizziness, loss of consciousness and headaches.  Endo/Heme/Allergies: Negative for environmental allergies.  Psychiatric/Behavioral: Negative for depression. The patient is not nervous/anxious.        Objective:    Physical Exam Vitals signs and nursing note reviewed.  Constitutional:      General: She is not in acute distress.    Appearance: She is well-developed.  HENT:     Head: Normocephalic and atraumatic.     Nose: Nose normal.  Eyes:     General:        Right eye: No discharge.        Left eye: No discharge.  Neck:     Musculoskeletal: Normal range of motion and neck supple.  Cardiovascular:     Rate and Rhythm: Normal rate and regular rhythm.     Heart sounds: No murmur.  Pulmonary:     Effort: Pulmonary effort is normal.     Breath sounds: Normal breath sounds.  Abdominal:     General: Bowel sounds are normal.     Palpations: Abdomen is soft.     Tenderness: There is no abdominal tenderness.  Skin:    General: Skin is warm and dry.  Neurological:     Mental Status: She is alert and oriented to person, place, and time.     BP 102/76 (BP Location:  Left Arm, Patient Position: Sitting, Cuff Size: Normal)   Pulse 84   Temp (!) 97.5 F (36.4 C) (Oral)   Resp 18   Wt 209 lb (94.8 kg)   SpO2 97%   BMI 31.78 kg/m  Wt Readings from Last 3 Encounters:  02/20/18 209 lb (94.8 kg)  12/31/17 210 lb (95.3 kg)  08/29/17 216 lb 9.6 oz (98.2 kg)     Lab Results  Component Value Date   WBC 3.9 (L) 02/20/2018   HGB 14.9 02/20/2018   HCT 44.3 02/20/2018   PLT 367.0 02/20/2018   GLUCOSE 104 (H) 02/20/2018   CHOL 186 02/02/2018   TRIG 91 02/02/2018   HDL 47 02/02/2018   LDLCALC 121 (H) 02/02/2018   ALT 14 02/20/2018   AST 19 02/20/2018   NA 139 02/20/2018   K 5.3 (H) 02/20/2018   CL 103 02/20/2018   CREATININE 1.02 02/20/2018   BUN 25 (H) 02/20/2018   CO2 29 02/20/2018   TSH 0.96 02/20/2018   HGBA1C 6.6 (H) 02/20/2018   MICROALBUR 2.3 (H) 03/27/2015    Lab Results  Component Value Date   TSH 0.96 02/20/2018   Lab Results  Component Value Date   WBC 3.9 (L) 02/20/2018   HGB 14.9 02/20/2018   HCT 44.3 02/20/2018   MCV 90.3 02/20/2018   PLT 367.0 02/20/2018   Lab Results  Component Value Date   NA 139 02/20/2018   K 5.3 (H) 02/20/2018   CO2 29 02/20/2018   GLUCOSE 104 (H) 02/20/2018   BUN 25 (H) 02/20/2018   CREATININE 1.02 02/20/2018   BILITOT 0.6 02/20/2018   ALKPHOS 75 02/20/2018   AST 19 02/20/2018   ALT 14 02/20/2018   PROT 7.5 02/20/2018   ALBUMIN 4.4 02/20/2018   CALCIUM 10.2 02/20/2018   GFR 68.44 02/20/2018   Lab Results  Component Value Date   CHOL 186 02/02/2018   Lab Results  Component Value Date   HDL 47 02/02/2018   Lab Results  Component Value Date   LDLCALC 121 (H) 02/02/2018   Lab Results  Component Value Date   TRIG 91 02/02/2018   Lab Results  Component Value Date   CHOLHDL 4.0 02/02/2018   Lab Results  Component Value Date   HGBA1C 6.6 (H) 02/20/2018       Assessment & Plan:   Problem List Items Addressed This Visit    Hypertension - Primary    Well controlled, no  changes to meds. Encouraged heart healthy diet such as the DASH diet and exercise as tolerated.       Relevant Orders   Comprehensive metabolic panel (Completed)   TSH (Completed)   CBC (Completed)   Obesity    Encouraged DASH diet, decrease po intake and increase exercise as tolerated. Needs 7-8 hours of sleep nightly. Avoid trans fats, eat small, frequent meals every 4-5 hours with lean proteins, complex carbs and healthy fats. Minimize simple carbs, GMO foods.      Hyperlipidemia   Diabetes mellitus type 2 in obese ([facility])   Relevant Orders   Hemoglobin A1c (Completed)   Osteopenia    Encouraged to get adequate exercise, calcium and vitamin d intake      Lower back injury, initial encounter    Is following with ortho and manages with Hdryocodone but takes it sparingly due to nausea. Zofran helps the nausea.          I have discontinued Laverna Dossett. [place]'s ranitidine. I am also having her start on famotidine. Additionally, I am having her maintain her aspirin, PROBIOTIC DAILY, TURMERIC PO, acetaminophen, OVER THE COUNTER MEDICATION, Cyanocobalamin (VITAMIN B 12 PO), irbesartan, triamterene-hydrochlorothiazide, escitalopram, and atorvastatin.  Meds ordered this encounter  Medications  . famotidine (PEPCID) 40 MG tablet    Sig: Take 1 tablet (40 mg total)  by mouth daily.    Dispense:  90 tablet    Refill:  1     Penni Homans, MD

## 2018-02-23 NOTE — Assessment & Plan Note (Signed)
Encouraged DASH diet, decrease po intake and increase exercise as tolerated. Needs 7-8 hours of sleep nightly. Avoid trans fats, eat small, frequent meals every 4-5 hours with lean proteins, complex carbs and healthy fats. Minimize simple carbs, GMO foods. 

## 2018-02-23 NOTE — Assessment & Plan Note (Signed)
Is following with ortho and manages with Hdryocodone but takes it sparingly due to nausea. Zofran helps the nausea.

## 2018-02-23 NOTE — Assessment & Plan Note (Signed)
Encouraged to get adequate exercise, calcium and vitamin d intake 

## 2018-02-23 NOTE — Assessment & Plan Note (Signed)
Well controlled, no changes to meds. Encouraged heart healthy diet such as the DASH diet and exercise as tolerated.  °

## 2018-03-06 DIAGNOSIS — M47816 Spondylosis without myelopathy or radiculopathy, lumbar region: Secondary | ICD-10-CM | POA: Diagnosis not present

## 2018-03-06 DIAGNOSIS — M545 Low back pain: Secondary | ICD-10-CM | POA: Diagnosis not present

## 2018-03-06 DIAGNOSIS — M4310 Spondylolisthesis, site unspecified: Secondary | ICD-10-CM | POA: Diagnosis not present

## 2018-03-06 DIAGNOSIS — G8929 Other chronic pain: Secondary | ICD-10-CM | POA: Diagnosis not present

## 2018-03-09 DIAGNOSIS — H5203 Hypermetropia, bilateral: Secondary | ICD-10-CM | POA: Diagnosis not present

## 2018-03-09 DIAGNOSIS — H2513 Age-related nuclear cataract, bilateral: Secondary | ICD-10-CM | POA: Diagnosis not present

## 2018-03-09 DIAGNOSIS — H11442 Conjunctival cysts, left eye: Secondary | ICD-10-CM | POA: Diagnosis not present

## 2018-03-16 DIAGNOSIS — M899 Disorder of bone, unspecified: Secondary | ICD-10-CM | POA: Diagnosis not present

## 2018-03-16 DIAGNOSIS — M549 Dorsalgia, unspecified: Secondary | ICD-10-CM | POA: Diagnosis not present

## 2018-03-30 ENCOUNTER — Other Ambulatory Visit: Payer: Self-pay | Admitting: Family Medicine

## 2018-04-03 DIAGNOSIS — M5136 Other intervertebral disc degeneration, lumbar region: Secondary | ICD-10-CM | POA: Diagnosis not present

## 2018-04-03 DIAGNOSIS — S22070A Wedge compression fracture of T9-T10 vertebra, initial encounter for closed fracture: Secondary | ICD-10-CM | POA: Diagnosis not present

## 2018-04-03 DIAGNOSIS — S22078A Other fracture of T9-T10 vertebra, initial encounter for closed fracture: Secondary | ICD-10-CM | POA: Diagnosis not present

## 2018-04-18 ENCOUNTER — Other Ambulatory Visit: Payer: Self-pay | Admitting: Family Medicine

## 2018-05-15 NOTE — Progress Notes (Addendum)
Subjective:   Brenda [name] is a 73 y.o. female who presents for Medicare Annual (Subsequent) preventive examination.  Loves to read and look at face book. 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 Involved with church. Has 1 dtr in [facility]. 1 grandson.  Review of Systems: No ROS.  Medicare Wellness Visit. Additional risk factors are reflected in the social history. Cardiac Risk Factors include: advanced age (>36men, >99 women);dyslipidemia;hypertension;diabetes mellitus;obesity (BMI >30kg/m2) Sleep patterns: no issues at all per pt Home Safety/Smoke Alarms: Feels safe in home. Smoke alarms in place.  Lives alone in 2 story home. Step over tub w/ bench and rails.    Female:   Mammo- 09/2017       Dexa scan-  09/2017      CCS- 03/27/16. 5 yr recall per pt Eye- yearly w/ Dr.Welbourne     Objective:     Vitals: BP 122/64 (BP Location: Left Arm, Patient Position: Sitting, Cuff Size: Large)   Pulse 81   Ht 5\' 8"  (1.727 m)   Wt 216 lb 9.6 oz (98.2 kg)   SpO2 97%   BMI 32.93 kg/m   Body mass index is 32.93 kg/m.  Advanced Directives 05/18/2018 03/29/2016 12/25/2014 05/01/2014  Does Patient Have a Medical Advance Directive? Yes No No No  Does patient want to make changes to medical advance directive? Yes (MAU/Ambulatory/Procedural Areas - Information given) - - -  Would patient like information on creating a medical advance directive? - Yes (MAU/Ambulatory/Procedural Areas - Information given) Yes - Educational materials given Yes - Scientist, clinical (histocompatibility and immunogenetics) given    Tobacco Social History   Tobacco Use  Smoking Status Former Smoker  . Packs/day: 0.20  . Years: 15.00  . Pack years: 3.00  . Types: Cigarettes  Smokeless Tobacco Never Used  Tobacco Comment   Qquit 10 years ago     Counseling given: Not Answered Comment: Qquit 10 years ago   Clinical Intake: Pain : No/denies pain     Past Medical History:  Diagnosis Date  . Depression    counseling  . Diabetes mellitus type  2 in obese ([place]) 01/31/2013  . Esophageal reflux 08/24/2012  . GERD (gastroesophageal reflux disease)   . History of chicken pox    childhood age 53  . Hyperlipidemia    2011  . Hypertension   . Left hip pain 03/19/2016  . Medicare annual wellness visit, subsequent 12/25/2014  . Numerous moles 05/09/2015  . Obesity 10/17/2010  . Osteopenia 03/19/2016  . Other and unspecified hyperlipidemia 04/26/2012  . Other and unspecified hyperlipidemia 08/24/2012  . Preventative health care 03/24/2016  . Sacral fracture ([place]) 05/27/2016  . Seasonal allergies   . Tinea pedis 03/24/2016  . Urinary incontinence 08/24/2012   Past Surgical History:  Procedure Laterality Date  . BLADDER REPAIR     2002  . TOTAL ABDOMINAL HYSTERECTOMY     2002   Family History  Problem Relation Age of Onset  . Colon cancer Sister 65  . Breast cancer Sister 41  . Heart disease Father 43  . Hypertension Father   . Dementia Mother   . Cancer Paternal Grandfather        bone   Social History   Socioeconomic History  . Marital status: Divorced    Spouse name: Not on file  . Number of children: Not on file  . Years of education: Not on file  . Highest education level: Not on file  Occupational History  . Not on  file  Social Needs  . Financial resource strain: Not on file  . Food insecurity:    Worry: Not on file    Inability: Not on file  . Transportation needs:    Medical: Not on file    Non-medical: Not on file  Tobacco Use  . Smoking status: Former Smoker    Packs/day: 0.20    Years: 15.00    Pack years: 3.00    Types: Cigarettes  . Smokeless tobacco: Never Used  . Tobacco comment: Qquit 10 years ago  Substance and Sexual Activity  . Alcohol use: No    Alcohol/week: 0.0 standard drinks  . Drug use: No  . Sexual activity: Never  Lifestyle  . Physical activity:    Days per week: Not on file    Minutes per session: Not on file  . Stress: Not on file  Relationships  . Social connections:    Talks  on phone: Not on file    Gets together: Not on file    Attends religious service: Not on file    Active member of club or organization: Not on file    Attends meetings of clubs or organizations: Not on file    Relationship status: Not on file  Other Topics Concern  . Not on file  Social History Narrative  . Not on file    Outpatient Encounter Medications as of 05/18/2018  Medication Sig  . acetaminophen (TYLENOL ARTHRITIS PAIN) 650 MG CR tablet Take 1,300 mg by mouth daily.  Marland Kitchen aspirin 81 MG tablet Take 81 mg by mouth daily.  Marland Kitchen atorvastatin (LIPITOR) 80 MG tablet Take 1 tablet (80 mg total) by mouth daily.  . Cyanocobalamin (VITAMIN B 12 PO) Take by mouth.  . escitalopram (LEXAPRO) 20 MG tablet TAKE 1 TABLET EVERY DAY  . famotidine (PEPCID) 40 MG tablet Take 1 tablet (40 mg total) by mouth daily.  . irbesartan (AVAPRO) 150 MG tablet TAKE 1 TABLET EVERY DAY  . OVER THE COUNTER MEDICATION Hemorrohoid supporsitories-As directed  . Probiotic Product (PROBIOTIC DAILY) CAPS Take 1 capsule by mouth daily.  . traMADol (ULTRAM) 50 MG tablet Take 50 mg by mouth every 6 (six) hours as needed. for pain  . triamterene-hydrochlorothiazide (MAXZIDE-25) 37.5-25 MG tablet TAKE 1 TABLET EVERY DAY  . TURMERIC PO Take 1 capsule by mouth daily.   No facility-administered encounter medications on file as of 05/18/2018.     Activities of Daily Living In your present state of health, do you have any difficulty performing the following activities: 05/18/2018  Hearing? N  Vision? N  Difficulty concentrating or making decisions? N  Walking or climbing stairs? N  Dressing or bathing? N  Doing errands, shopping? N  Preparing Food and eating ? N  Using the Toilet? N  In the past six months, have you accidently leaked urine? N  Do you have problems with loss of bowel control? N  Managing your Medications? N  Managing your Finances? N  Housekeeping or managing your Housekeeping? N  Some recent data might be  hidden    Patient Care Team: Mosie Lukes, MD as PCP - General (Family Medicine)    Assessment:   This is a routine wellness examination for Halee. Physical assessment deferred to PCP.  Exercise Activities and Dietary recommendations Current Exercise Habits: Home exercise routine, Type of exercise: walking, Time (Minutes): 30, Frequency (Times/Week): 2, Weekly Exercise (Minutes/Week): 60, Intensity: Mild, Exercise limited by: None identified   Diet (meal preparation, eat  out, water intake, caffeinated beverages, dairy products, fruits and vegetables): in general, a "healthy" diet  , well balanced, on average, 3 meals per day  Pt reports she drinks plenty of water.    Goals    . Weight (lb) < 200 lb (90.7 kg)     With diet and exercise       Fall Risk Fall Risk  05/18/2018 08/29/2017 03/29/2016 03/19/2016 12/19/2014  Falls in the past year? 0 No Yes Yes No  Comment - - - Tripped and fell on a cement stump/bump. -  Number falls in past yr: - - 1 1 -  Injury with Fall? - - Yes Yes -  Comment - - - Tailbone injured. -  Follow up - - Education provided;Falls prevention discussed - -    Depression Screen PHQ 2/9 Scores 05/18/2018 08/29/2017 03/29/2016 03/19/2016  PHQ - 2 Score 0 0 0 0     Cognitive Function  Ad8 score reviewed for issues:  Issues making decisions:no  Less interest in hobbies / activities:no  Repeats questions, stories (family complaining):no  Trouble using ordinary gadgets (microwave, computer, phone):no  Forgets the month or year: no  Mismanaging finances: no  Remembering appts:no  Daily problems with thinking and/or memory:no Ad8 score is=0     MMSE - Mini Mental State Exam 03/29/2016  Orientation to time 5  Orientation to Place 5  Registration 3  Attention/ Calculation 5  Recall 3  Language- name 2 objects 2  Language- repeat 1  Language- follow 3 step command 3  Language- read & follow direction 1  Write a sentence 1  Copy design 1   Total score 30        Immunization History  Administered Date(s) Administered  . Influenza Split 01/01/2010, 01/16/2011, 11/25/2011, 01/03/2014  . Influenza Whole 12/31/2012  . Influenza, High Dose Seasonal PF 11/15/2016  . Influenza,inj,Quad PF,6+ Mos 12/20/2013, 12/19/2014  . Influenza-Unspecified 11/09/2015, 11/25/2017  . Pneumococcal Conjugate-13 01/25/2013  . Pneumococcal Polysaccharide-23 12/21/2007, 12/19/2014  . Tdap 04/17/2011    Screening Tests Health Maintenance  Topic Date Due  . OPHTHALMOLOGY EXAM  10/20/1955  . FOOT EXAM  03/26/2016  . HEMOGLOBIN A1C  08/22/2018  . MAMMOGRAM  09/13/2018  . COLONOSCOPY  03/27/2021  . TETANUS/TDAP  04/16/2021  . INFLUENZA VACCINE  Completed  . DEXA SCAN  Completed  . Hepatitis C Screening  Completed  . PNA vac Low Risk Adult  Completed       Plan:    Please schedule your next medicare wellness visit with me in 1 yr.  Continue to eat heart healthy diet (full of fruits, vegetables, whole grains, lean protein, water--limit salt, fat, and sugar intake) and increase physical activity as tolerated.  Try to eat a larger lunch and lighter dinner.  Continue doing brain stimulating activities (puzzles, reading, adult coloring books, staying active) to keep memory sharp.     I have personally reviewed and noted the following in the patient's chart:   . Medical and social history . Use of alcohol, tobacco or illicit drugs  . Current medications and supplements . Functional ability and status . Nutritional status . Physical activity . Advanced directives . List of other physicians . Hospitalizations, surgeries, and ER visits in previous 12 months . Vitals . Screenings to include cognitive, depression, and falls . Referrals and appointments  In addition, I have reviewed and discussed with patient certain preventive protocols, quality metrics, and best practice recommendations. A written personalized care plan for  preventive  services as well as general preventive health recommendations were provided to patient.     Shela Nevin, [place]  05/18/2018    Medical screening examination/treatment was performed by qualified clinical staff member and as supervising physician I was immediately available for consultation/collaboration. I have reviewed documentation and agree with assessment and plan.  Penni Homans, MD

## 2018-05-18 ENCOUNTER — Ambulatory Visit (INDEPENDENT_AMBULATORY_CARE_PROVIDER_SITE_OTHER): Payer: Medicare HMO | Admitting: *Deleted

## 2018-05-18 ENCOUNTER — Other Ambulatory Visit: Payer: Self-pay

## 2018-05-18 ENCOUNTER — Encounter: Payer: Self-pay | Admitting: *Deleted

## 2018-05-18 VITALS — BP 122/64 | HR 81 | Ht 68.0 in | Wt 216.6 lb

## 2018-05-18 DIAGNOSIS — Z0000 Encounter for general adult medical examination without abnormal findings: Secondary | ICD-10-CM

## 2018-05-18 NOTE — Patient Instructions (Signed)
Please schedule your next medicare wellness visit with me in 1 yr.  Continue to eat heart healthy diet (full of fruits, vegetables, whole grains, lean protein, water--limit salt, fat, and sugar intake) and increase physical activity as tolerated.  Try to eat a larger lunch and lighter dinner.  Continue doing brain stimulating activities (puzzles, reading, adult coloring books, staying active) to keep memory sharp.    Brenda [name] , Thank you for taking time to come for your Medicare Wellness Visit. I appreciate your ongoing commitment to your health goals. Please review the following plan we discussed and let me know if I can assist you in the future.   These are the goals we discussed: Goals    . Weight (lb) < 200 lb (90.7 kg)     With diet and exercise       This is a list of the screening recommended for you and due dates:  Health Maintenance  Topic Date Due  . Eye exam for diabetics  10/20/1955  . Complete foot exam   03/26/2016  . Hemoglobin A1C  08/22/2018  . Mammogram  09/13/2018  . Colon Cancer Screening  03/27/2021  . Tetanus Vaccine  04/16/2021  . Flu Shot  Completed  . DEXA scan (bone density measurement)  Completed  .  Hepatitis C: One time screening is recommended by Center for Disease Control  (CDC) for  adults born from [birth year] through 1965.   Completed  . Pneumonia vaccines  Completed    Health Maintenance After Age 105 After age 74, you are at a higher risk for certain long-term diseases and infections as well as injuries from falls. Falls are a major cause of broken bones and head injuries in people who are older than age 12. Getting regular preventive care can help to keep you healthy and well. Preventive care includes getting regular testing and making lifestyle changes as recommended by your health care provider. Talk with your health care provider about:  Which screenings and tests you should have. A screening is a test that checks for a disease when you have no  symptoms.  A diet and exercise plan that is right for you. What should I know about screenings and tests to prevent falls? Screening and testing are the best ways to find a health problem early. Early diagnosis and treatment give you the best chance of managing medical conditions that are common after age 54. Certain conditions and lifestyle choices may make you more likely to have a fall. Your health care provider may recommend:  Regular vision checks. Poor vision and conditions such as cataracts can make you more likely to have a fall. If you wear glasses, make sure to get your prescription updated if your vision changes.  Medicine review. Work with your health care provider to regularly review all of the medicines you are taking, including over-the-counter medicines. Ask your health care provider about any side effects that may make you more likely to have a fall. Tell your health care provider if any medicines that you take make you feel dizzy or sleepy.  Osteoporosis screening. Osteoporosis is a condition that causes the bones to get weaker. This can make the bones weak and cause them to break more easily.  Blood pressure screening. Blood pressure changes and medicines to control blood pressure can make you feel dizzy.  Strength and balance checks. Your health care provider may recommend certain tests to check your strength and balance while standing, walking, or changing positions.  Foot health exam. Foot pain and numbness, as well as not wearing proper footwear, can make you more likely to have a fall.  Depression screening. You may be more likely to have a fall if you have a fear of falling, feel emotionally low, or feel unable to do activities that you used to do.  Alcohol use screening. Using too much alcohol can affect your balance and may make you more likely to have a fall. What actions can I take to lower my risk of falls? General instructions  Talk with your health care  provider about your risks for falling. Tell your health care provider if: ? You fall. Be sure to tell your health care provider about all falls, even ones that seem minor. ? You feel dizzy, sleepy, or off-balance.  Take over-the-counter and prescription medicines only as told by your health care provider. These include any supplements.  Eat a healthy diet and maintain a healthy weight. A healthy diet includes low-fat dairy products, low-fat (lean) meats, and fiber from whole grains, beans, and lots of fruits and vegetables. Home safety  Remove any tripping hazards, such as rugs, cords, and clutter.  Install safety equipment such as grab bars in bathrooms and safety rails on stairs.  Keep rooms and walkways well-lit. Activity   Follow a regular exercise program to stay fit. This will help you maintain your balance. Ask your health care provider what types of exercise are appropriate for you.  If you need a cane or walker, use it as recommended by your health care provider.  Wear supportive shoes that have nonskid soles. Lifestyle  Do not drink alcohol if your health care provider tells you not to drink.  If you drink alcohol, limit how much you have: ? 0-1 drink a day for women. ? 0-2 drinks a day for men.  Be aware of how much alcohol is in your drink. In the [place] one drink equals one typical bottle of beer (12 oz), one-half glass of wine (5 oz), or one shot of hard liquor (1 oz).  Do not use any products that contain nicotine or tobacco, such as cigarettes and e-cigarettes. If you need help quitting, ask your health care provider. Summary  Having a healthy lifestyle and getting preventive care can help to protect your health and wellness after age 17.  Screening and testing are the best way to find a health problem early and help you avoid having a fall. Early diagnosis and treatment give you the best chance for managing medical conditions that are more common for people who  are older than age 26.  Falls are a major cause of broken bones and head injuries in people who are older than age 27. Take precautions to prevent a fall at home.  Work with your health care provider to learn what changes you can make to improve your health and wellness and to prevent falls. This information is not intended to replace advice given to you by your health care provider. Make sure you discuss any questions you have with your health care provider. Document Released: 01/01/2017 Document Revised: 01/01/2017 Document Reviewed: 01/01/2017 Elsevier Interactive Patient Education  2019 [employer].

## 2018-06-06 ENCOUNTER — Other Ambulatory Visit: Payer: Self-pay | Admitting: Family Medicine

## 2018-06-30 ENCOUNTER — Telehealth: Payer: Self-pay | Admitting: Family Medicine

## 2018-06-30 NOTE — Telephone Encounter (Signed)
Copied from [place] [id number]. Topic: Quick Communication - Rx Refill/Question >> Jun 30, 2018 12:29 PM Ahmed Prima L wrote: Medication: irbesartan (AVAPRO) 150 MG tablet  Has the patient contacted their pharmacy? Yes (Agent: If no, request that the patient contact the pharmacy for the refill.) (Agent: If yes, when and what did the pharmacy advise?)  Preferred Pharmacy (with phone number or street name): [facility], [place] [address] Phone: [phone] Fax: [phone]    Agent: Please be advised that RX refills may take up to 3 business days. We ask that you follow-up with your pharmacy.

## 2018-07-02 MED ORDER — IRBESARTAN 150 MG PO TABS: 150.0000 mg | ORAL_TABLET | Freq: Every day | ORAL | 1 refills | Status: DC

## 2018-07-02 NOTE — Telephone Encounter (Signed)
Medications sent in.

## 2018-07-31 ENCOUNTER — Other Ambulatory Visit: Payer: Self-pay | Admitting: Family Medicine

## 2018-08-04 ENCOUNTER — Telehealth: Payer: Self-pay | Admitting: Family Medicine

## 2018-08-04 NOTE — Telephone Encounter (Signed)
Copied from [place] [id number]. Topic: Quick Communication - Rx Refill/Question >> Aug 04, 2018 12:50 PM Erick Blinks wrote: Medication: escitalopram (LEXAPRO) 20 MG tablet [[id number]] + triamterene-hydrochlorothiazide (MAXZIDE-25) 37.5-25 MG tablet [[id number]] -PA needed for these medications.   Fax: [phone]

## 2018-08-04 NOTE — Telephone Encounter (Signed)
Copied from [place] [id number]. Topic: Quick Communication - Rx Refill/Question >> Aug 04, 2018 12:50 PM Erick Blinks wrote: Medication: famotidine (PEPCID) 40 MG tablet [[id number]]   Has the patient contacted their pharmacy? no (Agent: If no, request that the patient contact the pharmacy for the refill.) (Agent: If yes, when and what did the pharmacy advise?)  Preferred Pharmacy (with phone number or street name): [facility], [place] [address] Phone: [phone] Fax: [phone]    Agent: Please be advised that RX refills may take up to 3 business days. We ask that you follow-up with your pharmacy.

## 2018-08-05 MED ORDER — FAMOTIDINE 40 MG PO TABS: 40.0000 mg | ORAL_TABLET | Freq: Every day | ORAL | 1 refills | Status: DC

## 2018-08-05 NOTE — Telephone Encounter (Signed)
Medications was sent on 08/03/18 and 08/04/18

## 2018-08-05 NOTE — Telephone Encounter (Signed)
meds sent in

## 2018-08-14 ENCOUNTER — Other Ambulatory Visit (HOSPITAL_BASED_OUTPATIENT_CLINIC_OR_DEPARTMENT_OTHER): Payer: Self-pay | Admitting: Family Medicine

## 2018-08-14 DIAGNOSIS — Z1231 Encounter for screening mammogram for malignant neoplasm of breast: Secondary | ICD-10-CM

## 2018-08-15 ENCOUNTER — Other Ambulatory Visit: Payer: Self-pay | Admitting: Family Medicine

## 2018-08-21 DIAGNOSIS — M47816 Spondylosis without myelopathy or radiculopathy, lumbar region: Secondary | ICD-10-CM | POA: Diagnosis not present

## 2018-08-21 DIAGNOSIS — G8929 Other chronic pain: Secondary | ICD-10-CM | POA: Diagnosis not present

## 2018-08-21 DIAGNOSIS — S22070G Wedge compression fracture of T9-T10 vertebra, subsequent encounter for fracture with delayed healing: Secondary | ICD-10-CM | POA: Diagnosis not present

## 2018-08-21 DIAGNOSIS — S22078A Other fracture of T9-T10 vertebra, initial encounter for closed fracture: Secondary | ICD-10-CM | POA: Diagnosis not present

## 2018-08-21 DIAGNOSIS — M549 Dorsalgia, unspecified: Secondary | ICD-10-CM | POA: Diagnosis not present

## 2018-08-31 ENCOUNTER — Encounter: Payer: Medicare HMO | Admitting: Family Medicine

## 2018-09-11 DIAGNOSIS — M5124 Other intervertebral disc displacement, thoracic region: Secondary | ICD-10-CM | POA: Diagnosis not present

## 2018-09-11 DIAGNOSIS — S22070G Wedge compression fracture of T9-T10 vertebra, subsequent encounter for fracture with delayed healing: Secondary | ICD-10-CM | POA: Diagnosis not present

## 2018-09-11 DIAGNOSIS — M4804 Spinal stenosis, thoracic region: Secondary | ICD-10-CM | POA: Diagnosis not present

## 2018-09-14 ENCOUNTER — Other Ambulatory Visit: Payer: Self-pay

## 2018-09-14 ENCOUNTER — Encounter (HOSPITAL_BASED_OUTPATIENT_CLINIC_OR_DEPARTMENT_OTHER): Payer: Self-pay

## 2018-09-14 ENCOUNTER — Ambulatory Visit (HOSPITAL_BASED_OUTPATIENT_CLINIC_OR_DEPARTMENT_OTHER)
Admission: RE | Admit: 2018-09-14 | Discharge: 2018-09-14 | Disposition: A | Discharge status: Home / Self Care | Payer: Medicare HMO | Source: Ambulatory Visit | Attending: Family Medicine | Admitting: Family Medicine

## 2018-09-14 DIAGNOSIS — Z1231 Encounter for screening mammogram for malignant neoplasm of breast: Secondary | ICD-10-CM | POA: Insufficient documentation

## 2018-09-28 ENCOUNTER — Other Ambulatory Visit: Payer: Self-pay

## 2018-09-28 ENCOUNTER — Encounter: Payer: Self-pay | Admitting: Family Medicine

## 2018-09-28 ENCOUNTER — Ambulatory Visit (INDEPENDENT_AMBULATORY_CARE_PROVIDER_SITE_OTHER): Payer: Medicare HMO | Admitting: Family Medicine

## 2018-09-28 DIAGNOSIS — M8580 Other specified disorders of bone density and structure, unspecified site: Secondary | ICD-10-CM | POA: Diagnosis not present

## 2018-09-28 DIAGNOSIS — Z0000 Encounter for general adult medical examination without abnormal findings: Secondary | ICD-10-CM

## 2018-09-28 DIAGNOSIS — E669 Obesity, unspecified: Secondary | ICD-10-CM | POA: Diagnosis not present

## 2018-09-28 DIAGNOSIS — E1169 Type 2 diabetes mellitus with other specified complication: Secondary | ICD-10-CM

## 2018-09-28 DIAGNOSIS — E782 Mixed hyperlipidemia: Secondary | ICD-10-CM

## 2018-09-28 DIAGNOSIS — F4321 Adjustment disorder with depressed mood: Secondary | ICD-10-CM

## 2018-09-28 DIAGNOSIS — I10 Essential (primary) hypertension: Secondary | ICD-10-CM | POA: Diagnosis not present

## 2018-09-28 LAB — CBC
HCT: 44.4 % (ref 36.0–46.0)
Hemoglobin: 14.5 g/dL (ref 12.0–15.0)
MCHC: 32.7 g/dL (ref 30.0–36.0)
MCV: 91.7 fl (ref 78.0–100.0)
Platelets: 346 10*3/uL (ref 150.0–400.0)
RBC: 4.84 Mil/uL (ref 3.87–5.11)
RDW: 15 % (ref 11.5–15.5)
WBC: 4.2 10*3/uL (ref 4.0–10.5)

## 2018-09-28 LAB — LIPID PANEL
Cholesterol: 150 mg/dL (ref 0–200)
HDL: 50.4 mg/dL (ref >39.00)
LDL Cholesterol: 77 mg/dL (ref 0–99)
NonHDL: 99.93
Total CHOL/HDL Ratio: 3
Triglycerides: 115 mg/dL (ref 0.0–149.0)
VLDL: 23 mg/dL (ref 0.0–40.0)

## 2018-09-28 LAB — COMPREHENSIVE METABOLIC PANEL
ALT: 14 U/L (ref 0–35)
AST: 14 U/L (ref 0–37)
Albumin: 4.2 g/dL (ref 3.5–5.2)
Alkaline Phosphatase: 69 U/L (ref 39–117)
BUN: 24 mg/dL — ABNORMAL HIGH (ref 6–23)
CO2: 29 mEq/L (ref 19–32)
Calcium: 9.5 mg/dL (ref 8.4–10.5)
Chloride: 102 mEq/L (ref 96–112)
Creatinine, Ser: 1.1 mg/dL (ref 0.40–1.20)
GFR: 58.92 mL/min — ABNORMAL LOW (ref >60.00)
Glucose, Bld: 111 mg/dL — ABNORMAL HIGH (ref 70–99)
Potassium: 4.4 mEq/L (ref 3.5–5.1)
Sodium: 140 mEq/L (ref 135–145)
Total Bilirubin: 0.4 mg/dL (ref 0.2–1.2)
Total Protein: 6.9 g/dL (ref 6.0–8.3)

## 2018-09-28 LAB — TSH: TSH: 1.78 u[IU]/mL (ref 0.35–4.50)

## 2018-09-28 LAB — HEMOGLOBIN A1C: Hgb A1c MFr Bld: 6.8 % — ABNORMAL HIGH (ref 4.6–6.5)

## 2018-09-28 MED ORDER — ALPRAZOLAM 0.25 MG PO TABS: 0.1250 mg | ORAL_TABLET | Freq: Two times a day (BID) | ORAL | 1 refills | Status: DC | PRN

## 2018-09-28 NOTE — Assessment & Plan Note (Signed)
Well controlled, no changes to meds. Encouraged heart healthy diet such as the DASH diet and exercise as tolerated.  °

## 2018-09-28 NOTE — Patient Instructions (Addendum)
Melatonin 2-10 mg 1/2 hour before bed to help with sleep  Check vitals weekly  Call late august for flu shot options Preventive Care 22 Years and Older, Female Preventive care refers to lifestyle choices and visits with your health care provider that can promote health and wellness. This includes:  A yearly physical exam. This is also called an annual well check.  Regular dental and eye exams.  Immunizations.  Screening for certain conditions.  Healthy lifestyle choices, such as diet and exercise. What can I expect for my preventive care visit? Physical exam Your health care provider will check:  Height and weight. These may be used to calculate body mass index (BMI), which is a measurement that tells if you are at a healthy weight.  Heart rate and blood pressure.  Your skin for abnormal spots. Counseling Your health care provider may ask you questions about:  Alcohol, tobacco, and drug use.  Emotional well-being.  Home and relationship well-being.  Sexual activity.  Eating habits.  History of falls.  Memory and ability to understand (cognition).  Work and work Statistician.  Pregnancy and menstrual history. What immunizations do I need?  Influenza (flu) vaccine  This is recommended every year. Tetanus, diphtheria, and pertussis (Tdap) vaccine  You may need a Td booster every 10 years. Varicella (chickenpox) vaccine  You may need this vaccine if you have not already been vaccinated. Zoster (shingles) vaccine  You may need this after age 42. Pneumococcal conjugate (PCV13) vaccine  One dose is recommended after age 64. Pneumococcal polysaccharide (PPSV23) vaccine  One dose is recommended after age 31. Measles, mumps, and rubella (MMR) vaccine  You may need at least one dose of MMR if you were born in 1957 or later. You may also need a second dose. Meningococcal conjugate (MenACWY) vaccine  You may need this if you have certain conditions. Hepatitis  A vaccine  You may need this if you have certain conditions or if you travel or work in places where you may be exposed to hepatitis A. Hepatitis B vaccine  You may need this if you have certain conditions or if you travel or work in places where you may be exposed to hepatitis B. Haemophilus influenzae type b (Hib) vaccine  You may need this if you have certain conditions. You may receive vaccines as individual doses or as more than one vaccine together in one shot (combination vaccines). Talk with your health care provider about the risks and benefits of combination vaccines. What tests do I need? Blood tests  Lipid and cholesterol levels. These may be checked every 5 years, or more frequently depending on your overall health.  Hepatitis C test.  Hepatitis B test. Screening  Lung cancer screening. You may have this screening every year starting at age 47 if you have a 30-pack-year history of smoking and currently smoke or have quit within the past 15 years.  Colorectal cancer screening. All adults should have this screening starting at age 52 and continuing until age 93. Your health care provider may recommend screening at age 59 if you are at increased risk. You will have tests every 1-10 years, depending on your results and the type of screening test.  Diabetes screening. This is done by checking your blood sugar (glucose) after you have not eaten for a while (fasting). You may have this done every 1-3 years.  Mammogram. This may be done every 1-2 years. Talk with your health care provider about how often you should have  regular mammograms.  BRCA-related cancer screening. This may be done if you have a family history of breast, ovarian, tubal, or peritoneal cancers. Other tests  Sexually transmitted disease (STD) testing.  Bone density scan. This is done to screen for osteoporosis. You may have this done starting at age 76. Follow these instructions at home: Eating and drinking   Eat a diet that includes fresh fruits and vegetables, whole grains, lean protein, and low-fat dairy products. Limit your intake of foods with high amounts of sugar, saturated fats, and salt.  Take vitamin and mineral supplements as recommended by your health care provider.  Do not drink alcohol if your health care provider tells you not to drink.  If you drink alcohol: ? Limit how much you have to 0-1 drink a day. ? Be aware of how much alcohol is in your drink. In the [place] one drink equals one 12 oz bottle of beer (355 mL), one 5 oz glass of wine (148 mL), or one 1 oz glass of hard liquor (44 mL). Lifestyle  Take daily care of your teeth and gums.  Stay active. Exercise for at least 30 minutes on 5 or more days each week.  Do not use any products that contain nicotine or tobacco, such as cigarettes, e-cigarettes, and chewing tobacco. If you need help quitting, ask your health care provider.  If you are sexually active, practice safe sex. Use a condom or other form of protection in order to prevent STIs (sexually transmitted infections).  Talk with your health care provider about taking a low-dose aspirin or statin. What's next?  Go to your health care provider once a year for a well check visit.  Ask your health care provider how often you should have your eyes and teeth checked.  Stay up to date on all vaccines. This information is not intended to replace advice given to you by your health care provider. Make sure you discuss any questions you have with your health care provider. Document Released: 03/17/2015 Document Revised: 02/12/2018 Document Reviewed: 02/12/2018 Elsevier Patient Education  2020 [employer].

## 2018-09-28 NOTE — Assessment & Plan Note (Signed)
Tolerating statin, encouraged heart healthy diet, avoid trans fats, minimize simple carbs and saturated fats. Increase exercise as tolerated 

## 2018-09-28 NOTE — Assessment & Plan Note (Signed)
hgba1c acceptable, minimize simple carbs. Increase exercise as tolerated. Continue current meds 

## 2018-09-30 DIAGNOSIS — F4321 Adjustment disorder with depressed mood: Secondary | ICD-10-CM | POA: Insufficient documentation

## 2018-09-30 HISTORY — DX: Adjustment disorder with depressed mood: F43.21

## 2018-09-30 NOTE — Assessment & Plan Note (Signed)
Encouraged to get adequate exercise, calcium and vitamin d intake 

## 2018-09-30 NOTE — Assessment & Plan Note (Signed)
Patient encouraged to maintain heart healthy diet, regular exercise, adequate sleep. Consider daily probiotics. Take medications as prescribed. Labs ordered and reviewed 

## 2018-09-30 NOTE — Progress Notes (Signed)
Subjective:    Patient ID: Brenda [name], female    DOB: Aug 30, 1945, 73 y.o.   MRN: [id number]  No chief complaint on file.   HPI Patient is in today for annual preventative exam and follow up on chronic medical concerns including hyperlipidemia, anxiety and diabetes. No polyuria or polydipsia. No recent febrile illness or hospitalizations. Is trying to maintain quarantine and stays active. She is struggling with grief after her daughter died a couple of months ago after surgery. Denies CP/palp/SOB/HA/congestion/fevers/GI or GU c/o. Taking meds as prescribed  Past Medical History:  Diagnosis Date  . Depression    counseling  . Diabetes mellitus type 2 in obese ([facility]) 01/31/2013  . Esophageal reflux 08/24/2012  . GERD (gastroesophageal reflux disease)   . History of chicken pox    childhood age 87  . Hyperlipidemia    2011  . Hypertension   . Left hip pain 03/19/2016  . Medicare annual wellness visit, subsequent 12/25/2014  . Numerous moles 05/09/2015  . Obesity 10/17/2010  . Osteopenia 03/19/2016  . Other and unspecified hyperlipidemia 04/26/2012  . Other and unspecified hyperlipidemia 08/24/2012  . Preventative health care 03/24/2016  . Sacral fracture ([facility]) 05/27/2016  . Seasonal allergies   . Tinea pedis 03/24/2016  . Urinary incontinence 08/24/2012    Past Surgical History:  Procedure Laterality Date  . BLADDER REPAIR     2002  . TOTAL ABDOMINAL HYSTERECTOMY     2002    Family History  Problem Relation Age of Onset  . Colon cancer Sister 37  . Breast cancer Sister 31  . Heart disease Father 79  . Hypertension Father   . Dementia Mother   . Cancer Paternal Grandfather        bone  . Obesity Daughter     Social History   Socioeconomic History  . Marital status: Divorced    Spouse name: Not on file  . Number of children: Not on file  . Years of education: Not on file  . Highest education level: Not on file  Occupational History  . Not on file  Social Needs  .  Financial resource strain: Not on file  . Food insecurity    Worry: Not on file    Inability: Not on file  . Transportation needs    Medical: Not on file    Non-medical: Not on file  Tobacco Use  . Smoking status: Former Smoker    Packs/day: 0.20    Years: 15.00    Pack years: 3.00    Types: Cigarettes  . Smokeless tobacco: Never Used  . Tobacco comment: Qquit 10 years ago  Substance and Sexual Activity  . Alcohol use: No    Alcohol/week: 0.0 standard drinks  . Drug use: No  . Sexual activity: Never  Lifestyle  . Physical activity    Days per week: Not on file    Minutes per session: Not on file  . Stress: Not on file  Relationships  . Social Herbalist on phone: Not on file    Gets together: Not on file    Attends religious service: Not on file    Active member of club or organization: Not on file    Attends meetings of clubs or organizations: Not on file    Relationship status: Not on file  . Intimate partner violence    Fear of current or ex partner: Not on file    Emotionally abused: Not on file  Physically abused: Not on file    Forced sexual activity: Not on file  Other Topics Concern  . Not on file  Social History Narrative  . Not on file    Outpatient Medications Prior to Visit  Medication Sig Dispense Refill  . acetaminophen (TYLENOL ARTHRITIS PAIN) 650 MG CR tablet Take 1,300 mg by mouth daily.    Marland Kitchen aspirin 81 MG tablet Take 81 mg by mouth daily.    . Cyanocobalamin (VITAMIN B 12 PO) Take by mouth.    . escitalopram (LEXAPRO) 20 MG tablet TAKE 1 TABLET EVERY DAY 90 tablet 0  . famotidine (PEPCID) 40 MG tablet TAKE 1 TABLET (40 MG TOTAL) BY MOUTH DAILY. 90 tablet 1  . irbesartan (AVAPRO) 150 MG tablet Take 1 tablet (150 mg total) by mouth daily. 90 tablet 1  . OVER THE COUNTER MEDICATION Hemorrohoid supporsitories-As directed    . Probiotic Product (PROBIOTIC DAILY) CAPS Take 1 capsule by mouth daily.    . traMADol (ULTRAM) 50 MG tablet Take  50 mg by mouth every 6 (six) hours as needed. for pain    . triamterene-hydrochlorothiazide (MAXZIDE-25) 37.5-25 MG tablet TAKE 1 TABLET EVERY DAY 90 tablet 0  . TURMERIC PO Take 1 capsule by mouth daily.    Marland Kitchen atorvastatin (LIPITOR) 80 MG tablet Take 1 tablet (80 mg total) by mouth daily. 90 tablet 3   No facility-administered medications prior to visit.     Allergies  Allergen Reactions  . Meloxicam Nausea Only and Nausea And Vomiting    Review of Systems  Constitutional: Negative for fever and malaise/fatigue.  HENT: Negative for congestion.   Eyes: Negative for blurred vision.  Respiratory: Negative for shortness of breath.   Cardiovascular: Negative for chest pain, palpitations and leg swelling.  Gastrointestinal: Negative for abdominal pain, blood in stool and nausea.  Genitourinary: Negative for dysuria and frequency.  Musculoskeletal: Positive for back pain. Negative for falls.  Skin: Negative for rash.  Neurological: Negative for dizziness, loss of consciousness and headaches.  Endo/Heme/Allergies: Negative for environmental allergies.  Psychiatric/Behavioral: Positive for depression. Negative for suicidal ideas. The patient is nervous/anxious.        Objective:    Physical Exam  BP 102/78 (BP Location: Left Arm, Patient Position: Sitting, Cuff Size: Normal)   Pulse 90   Temp 98.7 F (37.1 C) (Oral)   Resp 18   Wt 211 lb 12.8 oz (96.1 kg)   SpO2 98%   BMI 32.20 kg/m  Wt Readings from Last 3 Encounters:  09/28/18 211 lb 12.8 oz (96.1 kg)  05/18/18 216 lb 9.6 oz (98.2 kg)  02/20/18 209 lb (94.8 kg)    Diabetic Foot Exam - Simple   No data filed     Lab Results  Component Value Date   WBC 4.2 09/28/2018   HGB 14.5 09/28/2018   HCT 44.4 09/28/2018   PLT 346.0 09/28/2018   GLUCOSE 111 (H) 09/28/2018   CHOL 150 09/28/2018   TRIG 115.0 09/28/2018   HDL 50.40 09/28/2018   LDLCALC 77 09/28/2018   ALT 14 09/28/2018   AST 14 09/28/2018   NA 140 09/28/2018    K 4.4 09/28/2018   CL 102 09/28/2018   CREATININE 1.10 09/28/2018   BUN 24 (H) 09/28/2018   CO2 29 09/28/2018   TSH 1.78 09/28/2018   HGBA1C 6.8 (H) 09/28/2018   MICROALBUR 2.3 (H) 03/27/2015    Lab Results  Component Value Date   TSH 1.78 09/28/2018   Lab  Results  Component Value Date   WBC 4.2 09/28/2018   HGB 14.5 09/28/2018   HCT 44.4 09/28/2018   MCV 91.7 09/28/2018   PLT 346.0 09/28/2018   Lab Results  Component Value Date   NA 140 09/28/2018   K 4.4 09/28/2018   CO2 29 09/28/2018   GLUCOSE 111 (H) 09/28/2018   BUN 24 (H) 09/28/2018   CREATININE 1.10 09/28/2018   BILITOT 0.4 09/28/2018   ALKPHOS 69 09/28/2018   AST 14 09/28/2018   ALT 14 09/28/2018   PROT 6.9 09/28/2018   ALBUMIN 4.2 09/28/2018   CALCIUM 9.5 09/28/2018   GFR 58.92 (L) 09/28/2018   Lab Results  Component Value Date   CHOL 150 09/28/2018   Lab Results  Component Value Date   HDL 50.40 09/28/2018   Lab Results  Component Value Date   LDLCALC 77 09/28/2018   Lab Results  Component Value Date   TRIG 115.0 09/28/2018   Lab Results  Component Value Date   CHOLHDL 3 09/28/2018   Lab Results  Component Value Date   HGBA1C 6.8 (H) 09/28/2018       Assessment & Plan:   Problem List Items Addressed This Visit    Hypertension    Well controlled, no changes to meds. Encouraged heart healthy diet such as the DASH diet and exercise as tolerated.       Relevant Orders   CBC (Completed)   Comprehensive metabolic panel (Completed)   TSH (Completed)   Hyperlipidemia    Tolerating statin, encouraged heart healthy diet, avoid trans fats, minimize simple carbs and saturated fats. Increase exercise as tolerated      Relevant Orders   Lipid panel (Completed)   Diabetes mellitus type 2 in obese ([facility])    hgba1c acceptable, minimize simple carbs. Increase exercise as tolerated. Continue current meds      Relevant Orders   Hemoglobin A1c (Completed)   Feeling grief    She lost her  74 year old daughter from complications after bariatric surgery. She feels she is managing well with the help of friends and family. She will notify us if she needs more support but she is given a few Alprazolam to use when she feels panicky         I am having Brenda [name] start on ALPRAZolam. I am also having her maintain her aspirin, Probiotic Daily, TURMERIC PO, acetaminophen, OVER THE COUNTER MEDICATION, Cyanocobalamin (VITAMIN B 12 PO), atorvastatin, traMADol, irbesartan, triamterene-hydrochlorothiazide, escitalopram, and famotidine.  Meds ordered this encounter  Medications  . ALPRAZolam (XANAX) 0.25 MG tablet    Sig: Take 0.5-1 tablets (0.125-0.25 mg total) by mouth 2 (two) times daily as needed for anxiety.    Dispense:  20 tablet    Refill:  1     Penni Homans, MD

## 2018-09-30 NOTE — Assessment & Plan Note (Signed)
She lost her 73 year old daughter from complications after bariatric surgery. She feels she is managing well with the help of friends and family. She will notify us if she needs more support but she is given a few Alprazolam to use when she feels panicky

## 2018-10-07 ENCOUNTER — Other Ambulatory Visit: Payer: Self-pay | Admitting: Family Medicine

## 2018-10-29 ENCOUNTER — Encounter: Payer: Self-pay | Admitting: *Deleted

## 2018-11-26 ENCOUNTER — Other Ambulatory Visit: Payer: Self-pay

## 2018-11-26 ENCOUNTER — Ambulatory Visit (INDEPENDENT_AMBULATORY_CARE_PROVIDER_SITE_OTHER): Payer: Medicare HMO | Admitting: Family Medicine

## 2018-11-26 DIAGNOSIS — F419 Anxiety disorder, unspecified: Secondary | ICD-10-CM | POA: Diagnosis not present

## 2018-11-26 DIAGNOSIS — R32 Unspecified urinary incontinence: Secondary | ICD-10-CM | POA: Diagnosis not present

## 2018-11-26 DIAGNOSIS — N8110 Cystocele, unspecified: Secondary | ICD-10-CM

## 2018-11-26 DIAGNOSIS — I10 Essential (primary) hypertension: Secondary | ICD-10-CM | POA: Diagnosis not present

## 2018-11-26 DIAGNOSIS — E1169 Type 2 diabetes mellitus with other specified complication: Secondary | ICD-10-CM

## 2018-11-26 DIAGNOSIS — E669 Obesity, unspecified: Secondary | ICD-10-CM | POA: Diagnosis not present

## 2018-11-26 DIAGNOSIS — E782 Mixed hyperlipidemia: Secondary | ICD-10-CM

## 2018-11-26 DIAGNOSIS — E119 Type 2 diabetes mellitus without complications: Secondary | ICD-10-CM

## 2018-11-26 MED ORDER — ESCITALOPRAM OXALATE 20 MG PO TABS: 40.0000 mg | ORAL_TABLET | Freq: Every day | ORAL | 1 refills | Status: DC

## 2018-11-27 ENCOUNTER — Ambulatory Visit: Payer: Medicare HMO | Admitting: Family Medicine

## 2018-11-29 NOTE — Progress Notes (Signed)
Virtual Visit via phone Note  I connected with Brenda [name] on 11/26/18 at  3:00 PM EDT by a phone enabled telemedicine application and verified that I am speaking with the correct person using two identifiers. Brenda [name], CMA was able to get the patient set up on phone visit after being unable to set up video visit.  Location: Patient: home Provider: office   I discussed the limitations of evaluation and management by telemedicine and the availability of in person appointments. The patient expressed understanding and agreed to proceed.    Subjective:    Patient ID: Brenda [name], female    DOB: May 10, 1945, 73 y.o.   MRN: [id number]  Chief Complaint  Patient presents with   Follow-up    Pt states when she goes to the bathroom feel like something is falling     HPI Patient is in today for follow up on urinary incontinence, hypertension, diabetes. No recent febrile illness or hospitalizations. She is frustrated with worsening urinary incontinence, urgency and frequency. Feels her bladder may be prolapsing. No hematuria or dysuria. No abdominal pain. She is maintaining quarantine well. Denies CP/palp/SOB/HA/congestion/fevers/GI c/o. Taking meds as prescribed  Past Medical History:  Diagnosis Date   Depression    counseling   Diabetes mellitus type 2 in obese ([place]) 01/31/2013   Esophageal reflux 08/24/2012   GERD (gastroesophageal reflux disease)    History of chicken pox    childhood age 35   Hyperlipidemia    2011   Hypertension    Left hip pain 03/19/2016   Medicare annual wellness visit, subsequent 12/25/2014   Numerous moles 05/09/2015   Obesity 10/17/2010   Osteopenia 03/19/2016   Other and unspecified hyperlipidemia 04/26/2012   Other and unspecified hyperlipidemia 08/24/2012   Preventative health care 03/24/2016   Sacral fracture ([place]) 05/27/2016   Seasonal allergies    Tinea pedis 03/24/2016   Urinary incontinence 08/24/2012    Past Surgical History:   Procedure Laterality Date   BLADDER REPAIR     2002   TOTAL ABDOMINAL HYSTERECTOMY     2002    Family History  Problem Relation Age of Onset   Colon cancer Sister 61   Breast cancer Sister 25   Heart disease Father 1   Hypertension Father    Dementia Mother    Cancer Paternal Grandfather        bone   Obesity Daughter     Social History   Socioeconomic History   Marital status: Divorced    Spouse name: Not on file   Number of children: Not on file   Years of education: Not on file   Highest education level: Not on file  Occupational History   Not on file  Social Needs   Financial resource strain: Not on file   Food insecurity    Worry: Not on file    Inability: Not on file   Transportation needs    Medical: Not on file    Non-medical: Not on file  Tobacco Use   Smoking status: Former Smoker    Packs/day: 0.20    Years: 15.00    Pack years: 3.00    Types: Cigarettes   Smokeless tobacco: Never Used   Tobacco comment: Qquit 10 years ago  Substance and Sexual Activity   Alcohol use: No    Alcohol/week: 0.0 standard drinks   Drug use: No   Sexual activity: Never  Lifestyle   Physical activity    Days per week: Not  on file    Minutes per session: Not on file   Stress: Not on file  Relationships   Social connections    Talks on phone: Not on file    Gets together: Not on file    Attends religious service: Not on file    Active member of club or organization: Not on file    Attends meetings of clubs or organizations: Not on file    Relationship status: Not on file   Intimate partner violence    Fear of current or ex partner: Not on file    Emotionally abused: Not on file    Physically abused: Not on file    Forced sexual activity: Not on file  Other Topics Concern   Not on file  Social History Narrative   Not on file    Outpatient Medications Prior to Visit  Medication Sig Dispense Refill   acetaminophen (TYLENOL  ARTHRITIS PAIN) 650 MG CR tablet Take 1,300 mg by mouth daily.     ALPRAZolam (XANAX) 0.25 MG tablet Take 0.5-1 tablets (0.125-0.25 mg total) by mouth 2 (two) times daily as needed for anxiety. 20 tablet 1   aspirin 81 MG tablet Take 81 mg by mouth daily.     Cyanocobalamin (VITAMIN B 12 PO) Take by mouth.     famotidine (PEPCID) 40 MG tablet TAKE 1 TABLET (40 MG TOTAL) BY MOUTH DAILY. 90 tablet 1   irbesartan (AVAPRO) 150 MG tablet Take 1 tablet (150 mg total) by mouth daily. 90 tablet 1   OVER THE COUNTER MEDICATION Hemorrohoid supporsitories-As directed     Probiotic Product (PROBIOTIC DAILY) CAPS Take 1 capsule by mouth daily.     traMADol (ULTRAM) 50 MG tablet Take 50 mg by mouth every 6 (six) hours as needed. for pain     triamterene-hydrochlorothiazide (MAXZIDE-25) 37.5-25 MG tablet TAKE 1 TABLET EVERY DAY 90 tablet 0   TURMERIC PO Take 1 capsule by mouth daily.     escitalopram (LEXAPRO) 20 MG tablet TAKE 1 TABLET EVERY DAY 90 tablet 0   atorvastatin (LIPITOR) 80 MG tablet Take 1 tablet (80 mg total) by mouth daily. 90 tablet 3   No facility-administered medications prior to visit.     Allergies  Allergen Reactions   Meloxicam Nausea Only and Nausea And Vomiting    Review of Systems  Constitutional: Negative for fever and malaise/fatigue.  HENT: Negative for congestion.   Eyes: Negative for blurred vision.  Respiratory: Negative for shortness of breath.   Cardiovascular: Negative for chest pain, palpitations and leg swelling.  Gastrointestinal: Negative for abdominal pain, blood in stool and nausea.  Genitourinary: Positive for frequency and urgency. Negative for dysuria.  Musculoskeletal: Negative for falls.  Skin: Negative for rash.  Neurological: Positive for headaches. Negative for dizziness and loss of consciousness.  Endo/Heme/Allergies: Negative for environmental allergies.  Psychiatric/Behavioral: Negative for depression. The patient is not  nervous/anxious.        Objective:    Physical Exam  Unable to obtain via phone There were no vitals taken for this visit. Wt Readings from Last 3 Encounters:  09/28/18 211 lb 12.8 oz (96.1 kg)  05/18/18 216 lb 9.6 oz (98.2 kg)  02/20/18 209 lb (94.8 kg)    Diabetic Foot Exam - Simple   No data filed     Lab Results  Component Value Date   WBC 4.2 09/28/2018   HGB 14.5 09/28/2018   HCT 44.4 09/28/2018   PLT 346.0 09/28/2018  GLUCOSE 111 (H) 09/28/2018   CHOL 150 09/28/2018   TRIG 115.0 09/28/2018   HDL 50.40 09/28/2018   LDLCALC 77 09/28/2018   ALT 14 09/28/2018   AST 14 09/28/2018   NA 140 09/28/2018   K 4.4 09/28/2018   CL 102 09/28/2018   CREATININE 1.10 09/28/2018   BUN 24 (H) 09/28/2018   CO2 29 09/28/2018   TSH 1.78 09/28/2018   HGBA1C 6.8 (H) 09/28/2018   MICROALBUR 2.3 (H) 03/27/2015    Lab Results  Component Value Date   TSH 1.78 09/28/2018   Lab Results  Component Value Date   WBC 4.2 09/28/2018   HGB 14.5 09/28/2018   HCT 44.4 09/28/2018   MCV 91.7 09/28/2018   PLT 346.0 09/28/2018   Lab Results  Component Value Date   NA 140 09/28/2018   K 4.4 09/28/2018   CO2 29 09/28/2018   GLUCOSE 111 (H) 09/28/2018   BUN 24 (H) 09/28/2018   CREATININE 1.10 09/28/2018   BILITOT 0.4 09/28/2018   ALKPHOS 69 09/28/2018   AST 14 09/28/2018   ALT 14 09/28/2018   PROT 6.9 09/28/2018   ALBUMIN 4.2 09/28/2018   CALCIUM 9.5 09/28/2018   GFR 58.92 (L) 09/28/2018   Lab Results  Component Value Date   CHOL 150 09/28/2018   Lab Results  Component Value Date   HDL 50.40 09/28/2018   Lab Results  Component Value Date   LDLCALC 77 09/28/2018   Lab Results  Component Value Date   TRIG 115.0 09/28/2018   Lab Results  Component Value Date   CHOLHDL 3 09/28/2018   Lab Results  Component Value Date   HGBA1C 6.8 (H) 09/28/2018       Assessment & Plan:   Problem List Items Addressed This Visit    Hypertension    Monitor vitals weekly and  report any concerns. , no changes to meds. Encouraged heart healthy diet such as the DASH diet and exercise as tolerated.       Anxiety    Refill given on Lexapro which has been helpful      Relevant Medications   escitalopram (LEXAPRO) 20 MG tablet   Hyperlipidemia    Encouraged heart healthy diet, increase exercise, avoid trans fats, consider a krill oil cap daily      Urinary incontinence    With some urgency and frequency. She questions if she is suffering from from intermittent prolapse as something feels odd. Will refer to OB/GYN for evaluation of bladder prolapse. Then she can consider her options.       Diabetes mellitus type 2 in obese ([facility])    hgba1c acceptable, minimize simple carbs. Increase exercise as tolerated. Continue current meds       Other Visit Diagnoses    Female bladder prolapse    -  Primary   Relevant Orders   Ambulatory referral to Obstetrics / Gynecology      I have changed Lucita Ferrara. Rau's escitalopram. I am also having her maintain her aspirin, Probiotic Daily, TURMERIC PO, acetaminophen, OVER THE COUNTER MEDICATION, Cyanocobalamin (VITAMIN B 12 PO), atorvastatin, traMADol, irbesartan, famotidine, ALPRAZolam, and triamterene-hydrochlorothiazide.  Meds ordered this encounter  Medications   escitalopram (LEXAPRO) 20 MG tablet    Sig: Take 2 tablets (40 mg total) by mouth daily.    Dispense:  180 tablet    Refill:  1     I discussed the assessment and treatment plan with the patient. The patient was provided an opportunity to ask  questions and all were answered. The patient agreed with the plan and demonstrated an understanding of the instructions.   The patient was advised to call back or seek an in-person evaluation if the symptoms worsen or if the condition fails to improve as anticipated.  I provided 25 minutes of non-face-to-face time during this encounter.   Penni Homans, MD

## 2018-11-29 NOTE — Assessment & Plan Note (Signed)
Encouraged heart healthy diet, increase exercise, avoid trans fats, consider a krill oil cap daily 

## 2018-11-29 NOTE — Assessment & Plan Note (Signed)
Refill given on Lexapro which has been helpful

## 2018-11-29 NOTE — Assessment & Plan Note (Signed)
Monitor vitals weekly and report any concerns, no changes to meds. Encouraged heart healthy diet such as the DASH diet and exercise as tolerated.  

## 2018-11-29 NOTE — Assessment & Plan Note (Signed)
hgba1c acceptable, minimize simple carbs. Increase exercise as tolerated. Continue current meds 

## 2018-11-29 NOTE — Assessment & Plan Note (Signed)
With some urgency and frequency. She questions if she is suffering from from intermittent prolapse as something feels odd. Will refer to OB/GYN for evaluation of bladder prolapse. Then she can consider her options.

## 2018-12-01 ENCOUNTER — Telehealth: Payer: Self-pay

## 2018-12-01 NOTE — Telephone Encounter (Signed)
PA initiated via Covermymeds; KEY: [id number]. Awaiting determination.

## 2018-12-02 NOTE — Telephone Encounter (Signed)
PA approved.  PA Case: [id number], Status: Approved, Coverage Starts on: 03/04/2018 12:00:00 AM, Coverage Ends on: 03/04/2019 12:00:00 AM. Questions? Contact [phone]

## 2018-12-30 ENCOUNTER — Ambulatory Visit (INDEPENDENT_AMBULATORY_CARE_PROVIDER_SITE_OTHER): Payer: Medicare HMO | Admitting: Family

## 2018-12-30 ENCOUNTER — Encounter: Payer: Self-pay | Admitting: Family

## 2018-12-30 ENCOUNTER — Other Ambulatory Visit: Payer: Self-pay

## 2018-12-30 VITALS — BP 136/80 | HR 65 | Resp 18 | Ht 67.0 in | Wt 217.5 lb

## 2018-12-30 DIAGNOSIS — E782 Mixed hyperlipidemia: Secondary | ICD-10-CM

## 2018-12-30 DIAGNOSIS — R600 Localized edema: Secondary | ICD-10-CM | POA: Diagnosis not present

## 2018-12-30 DIAGNOSIS — I10 Essential (primary) hypertension: Secondary | ICD-10-CM | POA: Diagnosis not present

## 2018-12-30 DIAGNOSIS — R0600 Dyspnea, unspecified: Secondary | ICD-10-CM | POA: Diagnosis not present

## 2018-12-30 DIAGNOSIS — R0609 Other forms of dyspnea: Secondary | ICD-10-CM

## 2018-12-30 DIAGNOSIS — E119 Type 2 diabetes mellitus without complications: Secondary | ICD-10-CM | POA: Diagnosis not present

## 2018-12-30 MED ORDER — ATORVASTATIN CALCIUM 80 MG PO TABS: 80.0000 mg | ORAL_TABLET | Freq: Every day | ORAL | 3 refills | Status: DC

## 2018-12-30 NOTE — Patient Instructions (Signed)
Medication Instructions:  No medication changes today. Refill of Lipitor sent to mail order pharmacy.   *If you need a refill on your cardiac medications before your next appointment, please call your pharmacy*  Lab Work: No lab work today.   If you have labs (blood work) drawn today and your tests are completely normal, you will receive your results only by: Marland Kitchen MyChart Message (if you have MyChart) OR . A paper copy in the mail If you have any lab test that is abnormal or we need to change your treatment, we will call you to review the results.  Testing/Procedures: None today.  Follow-Up: At [facility], you and your health needs are our priority.  As part of our continuing mission to provide you with exceptional heart care, we have created designated Provider Care Teams.  These Care Teams include your primary Cardiologist (physician) and Advanced Practice Providers (APPs -  Physician Assistants and Nurse Practitioners) who all work together to provide you with the care you need, when you need it.  Your next appointment:   12 months  The format for your next appointment:   In Person  Provider:   Kirk Ruths, MD  Other Instructions Keep up the good work on low sodium diet!  Try to download the [employer] app.   Would recommend using 'exercise bands' as you can do exercises while sitting.   Try Youtube videos for exercise - "yoga for beginners', 'exercise bands', or 'silver sneakers'.

## 2018-12-30 NOTE — Progress Notes (Signed)
Office Visit    Patient Name: Brenda [name] Date of Encounter: 12/30/2018  Primary Care Provider:  Mosie Lukes, MD Primary Cardiologist:  Kirk Ruths, MD Electrophysiologist:  None   Chief Complaint    Brenda [name] is a 73 y.o. female with a hx of hypertension, hyperlipidemia, grade 1 diastolic dysfunction presents today for annual follow-up of cardiac conditions.  Past Medical History    Past Medical History:  Diagnosis Date  . Depression    counseling  . Diabetes mellitus type 2 in obese ([place]) 01/31/2013  . Esophageal reflux 08/24/2012  . GERD (gastroesophageal reflux disease)   . History of chicken pox    childhood age 30  . Hyperlipidemia    2011  . Hypertension   . Left hip pain 03/19/2016  . Medicare annual wellness visit, subsequent 12/25/2014  . Numerous moles 05/09/2015  . Obesity 10/17/2010  . Osteopenia 03/19/2016  . Other and unspecified hyperlipidemia 04/26/2012  . Other and unspecified hyperlipidemia 08/24/2012  . Preventative health care 03/24/2016  . Sacral fracture ([place]) 05/27/2016  . Seasonal allergies   . Tinea pedis 03/24/2016  . Urinary incontinence 08/24/2012   Past Surgical History:  Procedure Laterality Date  . BLADDER REPAIR     2002  . TOTAL ABDOMINAL HYSTERECTOMY     2002    Allergies  Allergies  Allergen Reactions  . Meloxicam Nausea Only and Nausea And Vomiting    History of Present Illness    Brenda [name] is a 73 y.o. female with a hx of hypertension, hyperlipidemia, grade 1 diastolic dysfunction last seen 12/31/2017 by Dr. Stanford Breed.   Previous cardiac testing includes echo 10/15 with EF 55 to 60% and moderate LVH.  Evidence of grade 1 diastolic dysfunction.  Mild tricuspid regurgitation and mild hyper tension with estimated PA pressure 35 mm.  Nuclear study 12/15 with apical thinning but no ischemia, EF 65.  Of note she has previously felt to need excision at night by Dr. Claiborne Billings.  She reports no chest pain, palpitations,  shortness of breath at rest.  She endorses her dyspnea on exertion is stable from the last time she was seen.  She does report some edema in her feet at the end of the day intermittently gets better by the morning.  We discussed venous insufficiency and lifestyle changes such as elevating her feet, avoiding salt.  She has cut salt out of her diet and reports this is led to better control of her blood pressure.  Monitors routinely at home with readings in the 120s over 80s.  She follows closely with her primary care provider.  She is still grieving the loss of her daughter unexpectedly who passed away in 09/12/2022 but tells me she is doing much better and Dr. Charlett Blake has been very helpful to help her adjust her medications for anxiety which has helped.  She has some chronic low back problems due to arthritis and reports that she has had difficulty exercising.  She used to enjoy walking but is no longer able to do so.  Tells me she has been doing exercises with her arms.  We discussed the options of using exercise bands at home in a seated position for exercise.  We discussed watching YouTube videos of yoga or silver sneakers.  Tells me she used to be a member of [employer] sne[employer] and she is going to try to resume but she is not sure they have reopened after coronavirus.  We discussed swimming as  a good method of exercise for cardiovascular health as well as her arthritis.  EKGs/Labs/Other Studies Reviewed:   The following studies were reviewed today:   EKG:  EKG is ordered today.  The ekg ordered today demonstrates SR rate 81 bpm with no acute ST/T wave changes.   Recent Labs: 09/28/2018: ALT 14; BUN 24; Creatinine, Ser 1.10; Hemoglobin 14.5; Platelets 346.0; Potassium 4.4; Sodium 140; TSH 1.78  Recent Lipid Panel    Component Value Date/Time   CHOL 150 09/28/2018 1121   CHOL 186 02/02/2018 1035   TRIG 115.0 09/28/2018 1121   HDL 50.40 09/28/2018 1121   HDL 47 02/02/2018 1035   CHOLHDL 3 09/28/2018 1121    VLDL 23.0 09/28/2018 1121   LDLCALC 77 09/28/2018 1121   LDLCALC 121 (H) 02/02/2018 1035    Home Medications   Current Meds  Medication Sig  . acetaminophen (TYLENOL ARTHRITIS PAIN) 650 MG CR tablet Take 1,300 mg by mouth daily.  Marland Kitchen ALPRAZolam (XANAX) 0.25 MG tablet Take 0.5-1 tablets (0.125-0.25 mg total) by mouth 2 (two) times daily as needed for anxiety.  Marland Kitchen aspirin 81 MG tablet Take 81 mg by mouth daily.  Marland Kitchen atorvastatin (LIPITOR) 80 MG tablet Take 1 tablet (80 mg total) by mouth daily.  . Cyanocobalamin (VITAMIN B 12 PO) Take by mouth.  . escitalopram (LEXAPRO) 20 MG tablet Take 2 tablets (40 mg total) by mouth daily.  . famotidine (PEPCID) 40 MG tablet TAKE 1 TABLET (40 MG TOTAL) BY MOUTH DAILY.  Marland Kitchen irbesartan (AVAPRO) 150 MG tablet Take 1 tablet (150 mg total) by mouth daily.  Marland Kitchen KRILL OIL PO Take 1 capsule by mouth daily.  Marland Kitchen OVER THE COUNTER MEDICATION Hemorrohoid supporsitories-As directed  . Probiotic Product (PROBIOTIC DAILY) CAPS Take 1 capsule by mouth daily.  . traMADol (ULTRAM) 50 MG tablet Take 50 mg by mouth every 6 (six) hours as needed. for pain  . triamterene-hydrochlorothiazide (MAXZIDE-25) 37.5-25 MG tablet TAKE 1 TABLET EVERY DAY  . TURMERIC PO Take 1 capsule by mouth daily.  . [DISCONTINUED] atorvastatin (LIPITOR) 80 MG tablet Take 1 tablet (80 mg total) by mouth daily.      Review of Systems       Review of Systems  Constitution: Negative for chills, fever and malaise/fatigue.  Cardiovascular: Positive for dyspnea on exertion. Negative for chest pain, leg swelling, near-syncope, orthopnea, palpitations, paroxysmal nocturnal dyspnea and syncope.  Respiratory: Negative for cough, shortness of breath and wheezing.   Musculoskeletal: Positive for back pain (chronic).  Gastrointestinal: Negative for nausea and vomiting.  Neurological: Negative for dizziness, light-headedness and weakness.   All other systems reviewed and are otherwise negative except as noted  above.  Physical Exam    VS:  BP 136/80 (BP Location: Left Arm, Patient Position: Sitting, Cuff Size: Large)   Pulse 65   Resp 18   Ht 5\' 7"  (1.702 m)   Wt 217 lb 8 oz (98.7 kg)   SpO2 96%   BMI 34.07 kg/m  , BMI Body mass index is 34.07 kg/m. GEN: Well nourished, overweight, well developed, in no acute distress. HEENT: normal. Neck: Supple, no JVD, carotid bruits, or masses. Cardiac: RRR, no murmurs, rubs, or gallops. No clubbing, cyanosis, edema.  Radials/DP/PT 2+ and equal bilaterally.  Respiratory:  Respirations regular and unlabored, clear to auscultation bilaterally. GI: Soft, nontender, nondistended, BS + x 4. MS: No deformity or atrophy. Skin: Warm and dry, no rash. Neuro:  Strength and sensation are intact. Psych: Normal affect.  Assessment & Plan    1. HTN -BP well controlled at home.  Continue present antihypertensive regimen.  Recommend continue low-salt, heart healthy diet.  Recommend adding back regular exercise by using exercise bands at home, swimming, yoga which may be more tolerable given her chronic lower back pain.  2. HLD -lipid profile 09/28/2018 with total cholesterol 150, HDL 50, LDL 77, triglycerides 115.  She will continue her atorvastatin 80 mg daily and try Krill oil.  A refill of her atorvastatin has been sent to her pharmacy for 1 year.  3. DOE -Stable compared to 1 year ago per her report.  Likely multifactorial including OSA, obesity, deconditioning.  Long discussion regarding increasing her activity level by finding exercise that she can do with her low back pain and she is excited to try some exercise bands or some swimming.  4. DM2 -09/28/2018 A1c 6.8.  She is presently controlled with diet and exercise.  Encouraged her to continue her good dietary choices.  5. Lower extremity edema- Present at the end of the day intermittently.  We discussed etiology of venous insufficiency, grade 1 diastolic dysfunction.  She is euvolemic on exam today, no  indication for diuretic at this time.  Recommend elevating lower extremities when sitting, continue to avoid salt, compression stockings.  Disposition: Follow up in 1 year(s) with Dr. Leonides Cave, NP 12/30/2018, 5:14 PM

## 2018-12-31 NOTE — Addendum Note (Signed)
Addended by: Loel Dubonnet on: 12/31/2018 01:41 PM   Modules accepted: Orders

## 2019-01-05 ENCOUNTER — Other Ambulatory Visit: Payer: Self-pay

## 2019-01-05 ENCOUNTER — Other Ambulatory Visit: Payer: Self-pay | Admitting: Family Medicine

## 2019-01-05 ENCOUNTER — Ambulatory Visit (INDEPENDENT_AMBULATORY_CARE_PROVIDER_SITE_OTHER): Payer: Medicare HMO | Admitting: Family Medicine

## 2019-01-05 DIAGNOSIS — K219 Gastro-esophageal reflux disease without esophagitis: Secondary | ICD-10-CM

## 2019-01-05 DIAGNOSIS — E1169 Type 2 diabetes mellitus with other specified complication: Secondary | ICD-10-CM

## 2019-01-05 DIAGNOSIS — E669 Obesity, unspecified: Secondary | ICD-10-CM | POA: Diagnosis not present

## 2019-01-05 DIAGNOSIS — M25562 Pain in left knee: Secondary | ICD-10-CM

## 2019-01-05 DIAGNOSIS — I10 Essential (primary) hypertension: Secondary | ICD-10-CM

## 2019-01-05 HISTORY — DX: Pain in left knee: M25.562

## 2019-01-05 NOTE — Assessment & Plan Note (Signed)
3 days but significant will refer to ortho for further consideration. Use tylenol and topical treatments prn. She requests Dr Kennon Holter in [facility]

## 2019-01-05 NOTE — Assessment & Plan Note (Signed)
She denies spike in sugar numbers despite acute pain

## 2019-01-05 NOTE — Assessment & Plan Note (Signed)
Monitor at home and report concerns, no changes to meds. Encouraged heart healthy diet such as the DASH diet and exercise as tolerated.

## 2019-01-05 NOTE — Progress Notes (Signed)
Virtual Visit via phone Note  I connected with Brenda [name] on 01/05/19 at  3:00 PM EST by a phone enabled telemedicine application and verified that I am speaking with the correct person using two identifiers.  Location: Patient: home  Provider: office   I discussed the limitations of evaluation and management by telemedicine and the availability of in person appointments. The patient expressed understanding and agreed to proceed. [name], CMA was able to get the patient set up on phone visit after being unable to set up a video visit   Subjective:    Patient ID: Brenda [name], female    DOB: [birth year], 73 y.o.   MRN: [id number]  No chief complaint on file.   HPI Patient is in today for evaluation of acute onset knee pain. Her left knee began hurting about 3 days ago. She denies any trauma or falls. No swelling, redness or warmth. She notes the pain is circumferential and keeping her activity limited. Her blood pressure and sugar have remained stable. She is using tylenol and biofreeze to manage the pain and it helps temporarily to some degree but pain persists. Denies CP/palp/SOB/HA/congestion/fevers/GI or GU c/o. Taking meds as prescribed  Past Medical History:  Diagnosis Date  . Depression    counseling  . Diabetes mellitus type 2 in obese ([place]) 01/31/2013  . Esophageal reflux 08/24/2012  . GERD (gastroesophageal reflux disease)   . History of chicken pox    childhood age 29  . Hyperlipidemia    2011  . Hypertension   . Left hip pain 03/19/2016  . Medicare annual wellness visit, subsequent 12/25/2014  . Numerous moles 05/09/2015  . Obesity 10/17/2010  . Osteopenia 03/19/2016  . Other and unspecified hyperlipidemia 04/26/2012  . Other and unspecified hyperlipidemia 08/24/2012  . Preventative health care 03/24/2016  . Sacral fracture ([place]) 05/27/2016  . Seasonal allergies   . Tinea pedis 03/24/2016  . Urinary incontinence 08/24/2012    Past Surgical History:  Procedure  Laterality Date  . BLADDER REPAIR     2002  . TOTAL ABDOMINAL HYSTERECTOMY     2002    Family History  Problem Relation Age of Onset  . Colon cancer Sister 73  . Breast cancer Sister 79  . Heart disease Father 51  . Hypertension Father   . Dementia Mother   . Cancer Paternal Grandfather        bone  . Obesity Daughter     Social History   Socioeconomic History  . Marital status: Divorced    Spouse name: Not on file  . Number of children: Not on file  . Years of education: Not on file  . Highest education level: Not on file  Occupational History  . Not on file  Social Needs  . Financial resource strain: Not on file  . Food insecurity    Worry: Not on file    Inability: Not on file  . Transportation needs    Medical: Not on file    Non-medical: Not on file  Tobacco Use  . Smoking status: Former Smoker    Packs/day: 0.20    Years: 15.00    Pack years: 3.00    Types: Cigarettes  . Smokeless tobacco: Never Used  . Tobacco comment: Qquit 10 years ago  Substance and Sexual Activity  . Alcohol use: No    Alcohol/week: 0.0 standard drinks  . Drug use: No  . Sexual activity: Never  Lifestyle  . Physical activity  Days per week: Not on file    Minutes per session: Not on file  . Stress: Not on file  Relationships  . Social Herbalist on phone: Not on file    Gets together: Not on file    Attends religious service: Not on file    Active member of club or organization: Not on file    Attends meetings of clubs or organizations: Not on file    Relationship status: Not on file  . Intimate partner violence    Fear of current or ex partner: Not on file    Emotionally abused: Not on file    Physically abused: Not on file    Forced sexual activity: Not on file  Other Topics Concern  . Not on file  Social History Narrative  . Not on file    Outpatient Medications Prior to Visit  Medication Sig Dispense Refill  . acetaminophen (TYLENOL ARTHRITIS PAIN)  650 MG CR tablet Take 1,300 mg by mouth daily.    Marland Kitchen ALPRAZolam (XANAX) 0.25 MG tablet Take 0.5-1 tablets (0.125-0.25 mg total) by mouth 2 (two) times daily as needed for anxiety. 20 tablet 1  . aspirin 81 MG tablet Take 81 mg by mouth daily.    Marland Kitchen atorvastatin (LIPITOR) 80 MG tablet Take 1 tablet (80 mg total) by mouth daily. 90 tablet 3  . Cyanocobalamin (VITAMIN B 12 PO) Take by mouth.    . escitalopram (LEXAPRO) 20 MG tablet Take 2 tablets (40 mg total) by mouth daily. 180 tablet 1  . famotidine (PEPCID) 40 MG tablet TAKE 1 TABLET (40 MG TOTAL) BY MOUTH DAILY. 90 tablet 1  . irbesartan (AVAPRO) 150 MG tablet Take 1 tablet (150 mg total) by mouth daily. 90 tablet 1  . KRILL OIL PO Take 1 capsule by mouth daily.    Marland Kitchen OVER THE COUNTER MEDICATION Hemorrohoid supporsitories-As directed    . Probiotic Product (PROBIOTIC DAILY) CAPS Take 1 capsule by mouth daily.    . traMADol (ULTRAM) 50 MG tablet Take 50 mg by mouth every 6 (six) hours as needed. for pain    . triamterene-hydrochlorothiazide (MAXZIDE-25) 37.5-25 MG tablet TAKE 1 TABLET EVERY DAY 90 tablet 0  . TURMERIC PO Take 1 capsule by mouth daily.     No facility-administered medications prior to visit.     Allergies  Allergen Reactions  . Meloxicam Nausea Only and Nausea And Vomiting    Review of Systems  Constitutional: Negative for fever and malaise/fatigue.  HENT: Negative for congestion.   Eyes: Negative for blurred vision.  Respiratory: Negative for shortness of breath.   Cardiovascular: Negative for chest pain, palpitations and leg swelling.  Gastrointestinal: Negative for abdominal pain, blood in stool and nausea.  Genitourinary: Negative for dysuria and frequency.  Musculoskeletal: Positive for joint pain. Negative for falls.  Skin: Negative for rash.  Neurological: Negative for dizziness, loss of consciousness and headaches.  Endo/Heme/Allergies: Negative for environmental allergies.  Psychiatric/Behavioral: Negative  for depression. The patient is not nervous/anxious.        Objective:    Physical Exam unable to obtain via phone visit  There were no vitals taken for this visit. Wt Readings from Last 3 Encounters:  12/30/18 217 lb 8 oz (98.7 kg)  09/28/18 211 lb 12.8 oz (96.1 kg)  05/18/18 216 lb 9.6 oz (98.2 kg)    Diabetic Foot Exam - Simple   No data filed     Lab Results  Component Value  Date   WBC 4.2 09/28/2018   HGB 14.5 09/28/2018   HCT 44.4 09/28/2018   PLT 346.0 09/28/2018   GLUCOSE 111 (H) 09/28/2018   CHOL 150 09/28/2018   TRIG 115.0 09/28/2018   HDL 50.40 09/28/2018   LDLCALC 77 09/28/2018   ALT 14 09/28/2018   AST 14 09/28/2018   NA 140 09/28/2018   K 4.4 09/28/2018   CL 102 09/28/2018   CREATININE 1.10 09/28/2018   BUN 24 (H) 09/28/2018   CO2 29 09/28/2018   TSH 1.78 09/28/2018   HGBA1C 6.8 (H) 09/28/2018   MICROALBUR 2.3 (H) 03/27/2015    Lab Results  Component Value Date   TSH 1.78 09/28/2018   Lab Results  Component Value Date   WBC 4.2 09/28/2018   HGB 14.5 09/28/2018   HCT 44.4 09/28/2018   MCV 91.7 09/28/2018   PLT 346.0 09/28/2018   Lab Results  Component Value Date   NA 140 09/28/2018   K 4.4 09/28/2018   CO2 29 09/28/2018   GLUCOSE 111 (H) 09/28/2018   BUN 24 (H) 09/28/2018   CREATININE 1.10 09/28/2018   BILITOT 0.4 09/28/2018   ALKPHOS 69 09/28/2018   AST 14 09/28/2018   ALT 14 09/28/2018   PROT 6.9 09/28/2018   ALBUMIN 4.2 09/28/2018   CALCIUM 9.5 09/28/2018   GFR 58.92 (L) 09/28/2018   Lab Results  Component Value Date   CHOL 150 09/28/2018   Lab Results  Component Value Date   HDL 50.40 09/28/2018   Lab Results  Component Value Date   LDLCALC 77 09/28/2018   Lab Results  Component Value Date   TRIG 115.0 09/28/2018   Lab Results  Component Value Date   CHOLHDL 3 09/28/2018   Lab Results  Component Value Date   HGBA1C 6.8 (H) 09/28/2018       Assessment & Plan:   Problem List Items Addressed This Visit     Hypertension    Monitor at home and report concerns, no changes to meds. Encouraged heart healthy diet such as the DASH diet and exercise as tolerated.       Diabetes mellitus type 2 in obese [facility])    She denies spike in sugar numbers despite acute pain      GERD (gastroesophageal reflux disease)    Is avoiding NSAIDs      Left knee pain    3 days but significant will refer to ortho for further consideration. Use tylenol and topical treatments prn. She requests Dr Kennon Holter in [facility]         I am having Wille Celeste maintain her aspirin, Probiotic Daily, TURMERIC PO, acetaminophen, OVER THE COUNTER MEDICATION, Cyanocobalamin (VITAMIN B 12 PO), traMADol, irbesartan, famotidine, ALPRAZolam, triamterene-hydrochlorothiazide, escitalopram, KRILL OIL PO, and atorvastatin.  No orders of the defined types were placed in this encounter.    I discussed the assessment and treatment plan with the patient. The patient was provided an opportunity to ask questions and all were answered. The patient agreed with the plan and demonstrated an understanding of the instructions.   The patient was advised to call back or seek an in-person evaluation if the symptoms worsen or if the condition fails to improve as anticipated.  I provided 25 minutes of non-face-to-face time during this encounter.   Penni Homans, MD

## 2019-01-05 NOTE — Assessment & Plan Note (Signed)
Is avoiding NSAIDs

## 2019-01-07 DIAGNOSIS — M25552 Pain in left hip: Secondary | ICD-10-CM | POA: Diagnosis not present

## 2019-01-07 DIAGNOSIS — M5116 Intervertebral disc disorders with radiculopathy, lumbar region: Secondary | ICD-10-CM | POA: Diagnosis not present

## 2019-01-07 DIAGNOSIS — M5136 Other intervertebral disc degeneration, lumbar region: Secondary | ICD-10-CM | POA: Diagnosis not present

## 2019-01-07 DIAGNOSIS — M47816 Spondylosis without myelopathy or radiculopathy, lumbar region: Secondary | ICD-10-CM | POA: Diagnosis not present

## 2019-01-15 ENCOUNTER — Other Ambulatory Visit: Payer: Self-pay

## 2019-01-15 ENCOUNTER — Ambulatory Visit (INDEPENDENT_AMBULATORY_CARE_PROVIDER_SITE_OTHER): Payer: Medicare HMO | Admitting: Obstetrics & Gynecology

## 2019-01-15 ENCOUNTER — Encounter: Payer: Self-pay | Admitting: Obstetrics & Gynecology

## 2019-01-15 VITALS — BP 131/71 | HR 80 | Ht 67.0 in | Wt 216.0 lb

## 2019-01-15 DIAGNOSIS — N8110 Cystocele, unspecified: Secondary | ICD-10-CM | POA: Diagnosis not present

## 2019-01-15 NOTE — Progress Notes (Signed)
Subjective:     Brenda [name] is a 73 y.o. female here for a routine exam. G1P1000 s/p SVD x1.  She is recovering from the death of her daughter in 10-05-18. She dies after an abdominoplasty presumable from a PE at age 36 yo.  Pt feel bulge from her vagina that is becoming worse. She is s/p hyst with bladder suspension 10 year prev. Pt denies incontinence of urine or stool. She does report that she has constipation occ which she is controlling with meds. She has had occ where she has had to push out stool. Does not think this is a chronic problem. She denies heavy lifting.     Gynecologic History No LMP recorded. Patient has had a hysterectomy. Contraception: status post hysterectomy Last Pap: .10 years prev.  Last mammogram: 09/14/2018. Results were: normal  Obstetric History OB History  Gravida Para Term Preterm AB Living  1 1 1         SAB TAB Ectopic Multiple Live Births          1    # Outcome Date GA Lbr Len/2nd Weight Sex Delivery Anes PTL Lv  1 Term      Vag-Spont      The following portions of the patient's history were reviewed and updated as appropriate: allergies, current medications, past family history, past medical history, past social history, past surgical history and problem list.  Review of Systems Pertinent items are noted in HPI.    Objective:  BP 131/71   Pulse 80   Ht 5\' 7"  (1.702 m)   Wt 216 lb (98 kg)   BMI 33.83 kg/m    CONSTITUTIONAL: Well-developed, well-nourished female in no acute distress.  HENT:  Normocephalic, atraumatic EYES: Conjunctivae and EOM are normal. No scleral icterus.  NECK: Normal range of motion SKIN: Skin is warm and dry. No rash noted. Not diaphoretic.No pallor. Comunas: Alert and oriented to person, place, and time. Normal coordination.  Abd: soft, NT, ND.  GU: EGBUS: no lesions Vagina: no blood in vault Adnexa: no masses; nontender   Assessment:   Stage III cystocele- reviewed conservative management with pessary vs  surgery. Pt reports that she does not want the inconvenience of a pessary and prefers to proceed with surgical eval.   No incontinence.    Plan:   Cystocele  Referral to UroGYN for diagnosis management of     Total face-to-face time with patient was 25 min.  Greater than 50% was spent in counseling and coordination of care with the patient.

## 2019-01-15 NOTE — Progress Notes (Signed)
Referral from Dr. Charlett Blake - bladder prolapse. Kathrene Alu RN

## 2019-01-23 ENCOUNTER — Other Ambulatory Visit: Payer: Self-pay | Admitting: Cardiology

## 2019-01-23 ENCOUNTER — Other Ambulatory Visit: Payer: Self-pay | Admitting: Family Medicine

## 2019-02-03 DIAGNOSIS — G8929 Other chronic pain: Secondary | ICD-10-CM | POA: Diagnosis not present

## 2019-02-03 DIAGNOSIS — M5136 Other intervertebral disc degeneration, lumbar region: Secondary | ICD-10-CM | POA: Diagnosis not present

## 2019-02-03 DIAGNOSIS — M461 Sacroiliitis, not elsewhere classified: Secondary | ICD-10-CM | POA: Diagnosis not present

## 2019-02-03 DIAGNOSIS — M25562 Pain in left knee: Secondary | ICD-10-CM | POA: Diagnosis not present

## 2019-02-03 DIAGNOSIS — M47816 Spondylosis without myelopathy or radiculopathy, lumbar region: Secondary | ICD-10-CM | POA: Diagnosis not present

## 2019-02-03 DIAGNOSIS — M5116 Intervertebral disc disorders with radiculopathy, lumbar region: Secondary | ICD-10-CM | POA: Diagnosis not present

## 2019-02-08 DIAGNOSIS — E669 Obesity, unspecified: Secondary | ICD-10-CM | POA: Diagnosis not present

## 2019-02-08 DIAGNOSIS — M25562 Pain in left knee: Secondary | ICD-10-CM | POA: Diagnosis not present

## 2019-02-08 DIAGNOSIS — I10 Essential (primary) hypertension: Secondary | ICD-10-CM | POA: Diagnosis not present

## 2019-02-08 DIAGNOSIS — M7122 Synovial cyst of popliteal space [Baker], left knee: Secondary | ICD-10-CM | POA: Diagnosis not present

## 2019-02-08 DIAGNOSIS — E1169 Type 2 diabetes mellitus with other specified complication: Secondary | ICD-10-CM | POA: Diagnosis not present

## 2019-03-22 ENCOUNTER — Other Ambulatory Visit: Payer: Self-pay | Admitting: Family Medicine

## 2019-03-25 DIAGNOSIS — M5136 Other intervertebral disc degeneration, lumbar region: Secondary | ICD-10-CM | POA: Diagnosis not present

## 2019-03-25 DIAGNOSIS — M47816 Spondylosis without myelopathy or radiculopathy, lumbar region: Secondary | ICD-10-CM | POA: Diagnosis not present

## 2019-03-25 DIAGNOSIS — Z79891 Long term (current) use of opiate analgesic: Secondary | ICD-10-CM | POA: Diagnosis not present

## 2019-03-25 DIAGNOSIS — I10 Essential (primary) hypertension: Secondary | ICD-10-CM | POA: Diagnosis not present

## 2019-03-25 DIAGNOSIS — E1169 Type 2 diabetes mellitus with other specified complication: Secondary | ICD-10-CM | POA: Diagnosis not present

## 2019-05-21 NOTE — Progress Notes (Signed)
urse connected with patient 05/24/19 at  1:00 PM EDT by a telephone enabled telemedicine application and verified that I am speaking with the correct person using two identifiers. Patient stated full name and DOB. Patient gave permission to continue with virtual visit. Patient's location was at home and Nurse's location was at [place] office.   Subjective:   Brenda [name] is a 74 y.o. female who presents for Medicare Annual (Subsequent) preventive examination.  Review of Systems:  Home Safety/Smoke Alarms: Feels safe in home. Smoke alarms in place.  Lives alone in 2 story home.  Female:         Mammo-09/14/18       Dexa scan- 09/12/17       CCS- 04/22/16.    Objective:     Vitals: BP 130/90 Comment: pt reported    Advanced Directives 05/24/2019 05/18/2018 03/29/2016 12/25/2014 05/01/2014  Does Patient Have a Medical Advance Directive? No Yes No No No  Does patient want to make changes to medical advance directive? - Yes (MAU/Ambulatory/Procedural Areas - Information given) - - -  Would patient like information on creating a medical advance directive? No - Patient declined - Yes (MAU/Ambulatory/Procedural Areas - Information given) Yes - Educational materials given Yes - Scientist, clinical (histocompatibility and immunogenetics) given    Tobacco Social History   Tobacco Use  Smoking Status Former Smoker  . Packs/day: 0.20  . Years: 15.00  . Pack years: 3.00  . Types: Cigarettes  Smokeless Tobacco Never Used  Tobacco Comment   Qquit 10 years ago     Counseling given: Not Answered Comment: Qquit 10 years ago   Clinical Intake: Pain : No/denies pain    Past Medical History:  Diagnosis Date  . Depression    counseling  . Diabetes mellitus type 2 in obese ([place]) 01/31/2013  . Esophageal reflux 08/24/2012  . GERD (gastroesophageal reflux disease)   . History of chicken pox    childhood age 43  . Hyperlipidemia    2011  . Hypertension   . Left hip pain 03/19/2016  . Medicare annual wellness visit, subsequent  12/25/2014  . Numerous moles 05/09/2015  . Obesity 10/17/2010  . Osteopenia 03/19/2016  . Other and unspecified hyperlipidemia 04/26/2012  . Other and unspecified hyperlipidemia 08/24/2012  . Preventative health care 03/24/2016  . Sacral fracture ([place]) 05/27/2016  . Seasonal allergies   . Tinea pedis 03/24/2016  . Urinary incontinence 08/24/2012   Past Surgical History:  Procedure Laterality Date  . BLADDER REPAIR     2002  . TOTAL ABDOMINAL HYSTERECTOMY     2002   Family History  Problem Relation Age of Onset  . Colon cancer Sister 58  . Breast cancer Sister 16  . Heart disease Father 26  . Hypertension Father   . Dementia Mother   . Cancer Paternal Grandfather        bone  . Obesity Daughter    Social History   Socioeconomic History  . Marital status: Divorced    Spouse name: Not on file  . Number of children: Not on file  . Years of education: Not on file  . Highest education level: Not on file  Occupational History  . Not on file  Tobacco Use  . Smoking status: Former Smoker    Packs/day: 0.20    Years: 15.00    Pack years: 3.00    Types: Cigarettes  . Smokeless tobacco: Never Used  . Tobacco comment: Qquit 10 years ago  Substance and Sexual Activity  .  Alcohol use: No    Alcohol/week: 0.0 standard drinks  . Drug use: No  . Sexual activity: Never  Other Topics Concern  . Not on file  Social History Narrative  . Not on file   Social Determinants of Health   Financial Resource Strain:   . Difficulty of Paying Living Expenses:   Food Insecurity:   . Worried About Charity fundraiser in the Last Year:   . Arboriculturist in the Last Year:   Transportation Needs:   . Film/video editor (Medical):   Marland Kitchen Lack of Transportation (Non-Medical):   Physical Activity:   . Days of Exercise per Week:   . Minutes of Exercise per Session:   Stress:   . Feeling of Stress :   Social Connections:   . Frequency of Communication with Friends and Family:   . Frequency  of Social Gatherings with Friends and Family:   . Attends Religious Services:   . Active Member of Clubs or Organizations:   . Attends Archivist Meetings:   Marland Kitchen Marital Status:     Outpatient Encounter Medications as of 05/24/2019  Medication Sig  . acetaminophen (TYLENOL ARTHRITIS PAIN) 650 MG CR tablet Take 1,300 mg by mouth daily.  Marland Kitchen ALPRAZolam (XANAX) 0.25 MG tablet Take 0.5-1 tablets (0.125-0.25 mg total) by mouth 2 (two) times daily as needed for anxiety.  Marland Kitchen aspirin 81 MG tablet Take 81 mg by mouth daily.  Marland Kitchen atorvastatin (LIPITOR) 80 MG tablet TAKE 1 TABLET EVERY DAY  . Cyanocobalamin (VITAMIN B 12 PO) Take by mouth.  . diclofenac (VOLTAREN) 75 MG EC tablet Take 75 mg by mouth.  . escitalopram (LEXAPRO) 20 MG tablet Take 2 tablets (40 mg total) by mouth daily.  . famotidine (PEPCID) 40 MG tablet TAKE 1 TABLET (40 MG TOTAL) BY MOUTH DAILY.  Marland Kitchen irbesartan (AVAPRO) 150 MG tablet TAKE 1 TABLET EVERY DAY  . KRILL OIL PO Take 1 capsule by mouth daily.  Marland Kitchen OVER THE COUNTER MEDICATION Hemorrohoid supporsitories-As directed  . Probiotic Product (PROBIOTIC DAILY) CAPS Take 1 capsule by mouth daily.  . traMADol (ULTRAM) 50 MG tablet Take 50 mg by mouth every 6 (six) hours as needed. for pain  . triamterene-hydrochlorothiazide (MAXZIDE-25) 37.5-25 MG tablet TAKE 1 TABLET EVERY DAY  . TURMERIC PO Take 1 capsule by mouth daily.   No facility-administered encounter medications on file as of 05/24/2019.    Activities of Daily Living In your present state of health, do you have any difficulty performing the following activities: 05/24/2019  Hearing? N  Vision? N  Difficulty concentrating or making decisions? N  Walking or climbing stairs? N  Dressing or bathing? N  Doing errands, shopping? N  Preparing Food and eating ? N  Using the Toilet? N  In the past six months, have you accidently leaked urine? Y  Do you have problems with loss of bowel control? N  Managing your Medications? N    Managing your Finances? N  Housekeeping or managing your Housekeeping? N  Some recent data might be hidden    Patient Care Team: Mosie Lukes, MD as PCP - General (Family Medicine) Stanford Breed Denice Bors, MD as PCP - Cardiology (Cardiology)    Assessment:   This is a routine wellness examination for Brenda [name]. Physical assessment deferred to PCP.  Exercise Activities and Dietary recommendations Current Exercise Habits: Home exercise routine, Type of exercise: stretching, Time (Minutes): 10, Frequency (Times/Week): 2, Weekly Exercise (Minutes/Week): 20, Exercise  limited by: None identified Diet (meal preparation, eat out, water intake, caffeinated beverages, dairy products, fruits and vegetables): in general, a "healthy" diet  , well balanced      Goals    . Increase physical activity    . Weight (lb) < 200 lb (90.7 kg)     With diet and exercise       Fall Risk Fall Risk  05/24/2019 05/18/2018 08/29/2017 03/29/2016 03/19/2016  Falls in the past year? 0 0 No Yes Yes  Comment - - - - Tripped and fell on a cement stump/bump.  Number falls in past yr: 0 - - 1 1  Injury with Fall? 0 - - Yes Yes  Comment - - - - Tailbone injured.  Follow up Education provided;Falls prevention discussed - - Education provided;Falls prevention discussed -   Depression Screen PHQ 2/9 Scores 05/24/2019 05/18/2018 08/29/2017 03/29/2016  PHQ - 2 Score 0 0 0 0     Cognitive Function Ad8 score reviewed for issues:  Issues making decisions:no  Less interest in hobbies / activities:no  Repeats questions, stories (family complaining):no  Trouble using ordinary gadgets (microwave, computer, phone):no  Forgets the month or year: no  Mismanaging finances: no  Remembering appts:no  Daily problems with thinking and/or memory:no Ad8 score is=0  MMSE - Mini Mental State Exam 03/29/2016  Orientation to time 5  Orientation to Place 5  Registration 3  Attention/ Calculation 5  Recall 3  Language- name 2  objects 2  Language- repeat 1  Language- follow 3 step command 3  Language- read & follow direction 1  Write a sentence 1  Copy design 1  Total score 30        Immunization History  Administered Date(s) Administered  . Influenza Split 01/01/2010, 01/16/2011, 11/25/2011, 01/03/2014  . Influenza Whole 12/31/2012  . Influenza, High Dose Seasonal PF 11/15/2016, 10/26/2018  . Influenza,inj,Quad PF,6+ Mos 12/20/2013, 12/19/2014  . Influenza-Unspecified 11/09/2015, 11/25/2017  . Pneumococcal Conjugate-13 01/25/2013  . Pneumococcal Polysaccharide-23 12/21/2007, 12/19/2014  . Tdap 04/17/2011   Screening Tests Health Maintenance  Topic Date Due  . OPHTHALMOLOGY EXAM  Never done  . FOOT EXAM  03/26/2016  . HEMOGLOBIN A1C  03/31/2019  . MAMMOGRAM  09/14/2019  . TETANUS/TDAP  04/16/2021  . COLONOSCOPY  04/22/2021  . INFLUENZA VACCINE  Completed  . DEXA SCAN  Completed  . Hepatitis C Screening  Completed  . PNA vac Low Risk Adult  Completed       Plan:    Please schedule your next medicare wellness visit with me in 1 yr.  Continue to eat heart healthy diet (full of fruits, vegetables, whole grains, lean protein, water--limit salt, fat, and sugar intake) and increase physical activity as tolerated.  Continue doing brain stimulating activities (puzzles, reading, adult coloring books, staying active) to keep memory sharp.   Bring a copy of your living will and/or healthcare power of attorney to your next office visit.    I have personally reviewed and noted the following in the patient's chart:   . Medical and social history . Use of alcohol, tobacco or illicit drugs  . Current medications and supplements . Functional ability and status . Nutritional status . Physical activity . Advanced directives . List of other physicians . Hospitalizations, surgeries, and ER visits in previous 12 months . Vitals . Screenings to include cognitive, depression, and falls . Referrals and  appointments  In addition, I have reviewed and discussed with patient certain preventive protocols, quality  metrics, and best practice recommendations. A written personalized care plan for preventive services as well as general preventive health recommendations were provided to patient.     Naaman Plummer Tab, [place]  05/24/2019

## 2019-05-24 ENCOUNTER — Ambulatory Visit (INDEPENDENT_AMBULATORY_CARE_PROVIDER_SITE_OTHER): Payer: Medicare HMO | Admitting: *Deleted

## 2019-05-24 ENCOUNTER — Other Ambulatory Visit: Payer: Self-pay

## 2019-05-24 ENCOUNTER — Encounter: Payer: Self-pay | Admitting: *Deleted

## 2019-05-24 VITALS — BP 130/90

## 2019-05-24 DIAGNOSIS — Z0000 Encounter for general adult medical examination without abnormal findings: Secondary | ICD-10-CM

## 2019-05-24 NOTE — Patient Instructions (Signed)
Please schedule your next medicare wellness visit with me in 1 yr.  Continue to eat heart healthy diet (full of fruits, vegetables, whole grains, lean protein, water--limit salt, fat, and sugar intake) and increase physical activity as tolerated.  Continue doing brain stimulating activities (puzzles, reading, adult coloring books, staying active) to keep memory sharp.   Bring a copy of your living will and/or healthcare power of attorney to your next office visit.   [name] , Thank you for taking time to come for your Medicare Wellness Visit. I appreciate your ongoing commitment to your health goals. Please review the following plan we discussed and let me know if I can assist you in the future.   These are the goals we discussed: Goals    . Increase physical activity    . Weight (lb) < 200 lb (90.7 kg)     With diet and exercise       This is a list of the screening recommended for you and due dates:  Health Maintenance  Topic Date Due  . Eye exam for diabetics  Never done  . Complete foot exam   03/26/2016  . Hemoglobin A1C  03/31/2019  . Mammogram  09/14/2019  . Tetanus Vaccine  04/16/2021  . Colon Cancer Screening  04/22/2021  . Flu Shot  Completed  . DEXA scan (bone density measurement)  Completed  .  Hepatitis C: One time screening is recommended by Center for Disease Control  (CDC) for  adults born from [birth year] through 1965.   Completed  . Pneumonia vaccines  Completed    Preventive Care 59 Years and Older, Female Preventive care refers to lifestyle choices and visits with your health care provider that can promote health and wellness. This includes:  A yearly physical exam. This is also called an annual well check.  Regular dental and eye exams.  Immunizations.  Screening for certain conditions.  Healthy lifestyle choices, such as diet and exercise. What can I expect for my preventive care visit? Physical exam Your health care provider will check:  Height and  weight. These may be used to calculate body mass index (BMI), which is a measurement that tells if you are at a healthy weight.  Heart rate and blood pressure.  Your skin for abnormal spots. Counseling Your health care provider may ask you questions about:  Alcohol, tobacco, and drug use.  Emotional well-being.  Home and relationship well-being.  Sexual activity.  Eating habits.  History of falls.  Memory and ability to understand (cognition).  Work and work Statistician.  Pregnancy and menstrual history. What immunizations do I need?  Influenza (flu) vaccine  This is recommended every year. Tetanus, diphtheria, and pertussis (Tdap) vaccine  You may need a Td booster every 10 years. Varicella (chickenpox) vaccine  You may need this vaccine if you have not already been vaccinated. Zoster (shingles) vaccine  You may need this after age 14. Pneumococcal conjugate (PCV13) vaccine  One dose is recommended after age 4. Pneumococcal polysaccharide (PPSV23) vaccine  One dose is recommended after age 56. Measles, mumps, and rubella (MMR) vaccine  You may need at least one dose of MMR if you were born in 1957 or later. You may also need a second dose. Meningococcal conjugate (MenACWY) vaccine  You may need this if you have certain conditions. Hepatitis A vaccine  You may need this if you have certain conditions or if you travel or work in places where you may be exposed to hepatitis  A. Hepatitis B vaccine  You may need this if you have certain conditions or if you travel or work in places where you may be exposed to hepatitis B. Haemophilus influenzae type b (Hib) vaccine  You may need this if you have certain conditions. You may receive vaccines as individual doses or as more than one vaccine together in one shot (combination vaccines). Talk with your health care provider about the risks and benefits of combination vaccines. What tests do I need? Blood  tests  Lipid and cholesterol levels. These may be checked every 5 years, or more frequently depending on your overall health.  Hepatitis C test.  Hepatitis B test. Screening  Lung cancer screening. You may have this screening every year starting at age 84 if you have a 30-pack-year history of smoking and currently smoke or have quit within the past 15 years.  Colorectal cancer screening. All adults should have this screening starting at age 21 and continuing until age 26. Your health care provider may recommend screening at age 24 if you are at increased risk. You will have tests every 1-10 years, depending on your results and the type of screening test.  Diabetes screening. This is done by checking your blood sugar (glucose) after you have not eaten for a while (fasting). You may have this done every 1-3 years.  Mammogram. This may be done every 1-2 years. Talk with your health care provider about how often you should have regular mammograms.  BRCA-related cancer screening. This may be done if you have a family history of breast, ovarian, tubal, or peritoneal cancers. Other tests  Sexually transmitted disease (STD) testing.  Bone density scan. This is done to screen for osteoporosis. You may have this done starting at age 74. Follow these instructions at home: Eating and drinking  Eat a diet that includes fresh fruits and vegetables, whole grains, lean protein, and low-fat dairy products. Limit your intake of foods with high amounts of sugar, saturated fats, and salt.  Take vitamin and mineral supplements as recommended by your health care provider.  Do not drink alcohol if your health care provider tells you not to drink.  If you drink alcohol: ? Limit how much you have to 0-1 drink a day. ? Be aware of how much alcohol is in your drink. In the [place] one drink equals one 12 oz bottle of beer (355 mL), one 5 oz glass of wine (148 mL), or one 1 oz glass of hard liquor (44  mL). Lifestyle  Take daily care of your teeth and gums.  Stay active. Exercise for at least 30 minutes on 5 or more days each week.  Do not use any products that contain nicotine or tobacco, such as cigarettes, e-cigarettes, and chewing tobacco. If you need help quitting, ask your health care provider.  If you are sexually active, practice safe sex. Use a condom or other form of protection in order to prevent STIs (sexually transmitted infections).  Talk with your health care provider about taking a low-dose aspirin or statin. What's next?  Go to your health care provider once a year for a well check visit.  Ask your health care provider how often you should have your eyes and teeth checked.  Stay up to date on all vaccines. This information is not intended to replace advice given to you by your health care provider. Make sure you discuss any questions you have with your health care provider. Document Revised: 02/12/2018 Document Reviewed: 02/12/2018  Elsevier Patient Education  [employer].

## 2019-05-31 ENCOUNTER — Other Ambulatory Visit: Payer: Self-pay | Admitting: Family Medicine

## 2019-06-08 DIAGNOSIS — N812 Incomplete uterovaginal prolapse: Secondary | ICD-10-CM | POA: Diagnosis not present

## 2019-06-08 DIAGNOSIS — E6609 Other obesity due to excess calories: Secondary | ICD-10-CM | POA: Diagnosis not present

## 2019-06-08 DIAGNOSIS — N3946 Mixed incontinence: Secondary | ICD-10-CM | POA: Diagnosis not present

## 2019-06-08 DIAGNOSIS — N8110 Cystocele, unspecified: Secondary | ICD-10-CM | POA: Diagnosis not present

## 2019-07-21 ENCOUNTER — Other Ambulatory Visit: Payer: Self-pay | Admitting: Family Medicine

## 2019-08-07 ENCOUNTER — Other Ambulatory Visit: Payer: Self-pay | Admitting: Family Medicine

## 2019-08-10 ENCOUNTER — Telehealth: Payer: Self-pay

## 2019-08-10 NOTE — Telephone Encounter (Signed)
Nurse Assessment Nurse: Waymond Cera, RN, Benjamine Mola Date/Time (Eastern Time): 08/09/2019 5:38:52 PM Confirm and document reason for call. If symptomatic, describe symptoms. ---Caller states that her feet are swelling. Wondering if her rx was sent in. Has the patient had close contact with a person known or suspected to have the novel coronavirus illness OR traveled / lives in area with major community spread (including international travel) in the last 14 days from the onset of symptoms? * If Asymptomatic, screen for exposure and travel within the last 14 days. ---No Does the patient have any new or worsening symptoms? ---Yes Will a triage be completed? ---Yes Related visit to physician within the last 2 weeks? ---No Does the PT have any chronic conditions? (i.e. diabetes, asthma, this includes High risk factors for pregnancy, etc.) ---Yes List chronic conditions. ---HTN high cholesterol Is this a behavioral health or substance abuse call? ---No Guidelines Guideline Title Affirmed Question Affirmed Notes Nurse Date/Time Eilene Ghazi Time) Leg Swelling and Edema [1] Difficulty breathing with exertion (e.g., walking) AND [2] newonset or worsening Cantrell, RN, Benjamine Mola 08/09/2019 5:40:08 PM Disp. Time (Eastern Time) Disposition Final UserPLEASE NOTE: All timestamps contained within this report are represented as [place] Standard Time. CONFIDENTIALTY NOTICE: This fax transmission is intended only for the addressee. It contains information that is legally privileged, confidential or otherwise protected from use or disclosure. If you are not the intended recipient, you are strictly prohibited from reviewing, disclosing, copying using or disseminating any of this information or taking any action in reliance on or regarding this information. If you have received this fax in error, please notify us immediately by telephone so that we can arrange for its return to [place]. Phone: [phone], Toll-Free:  [phone], Fax: [phone] Page: 2 of 2 Call Id: [id number] 08/09/2019 5:44:12 PM Go to ED Now (or PCP triage) Yes Cantrell, RN, Elizabeth  Pt refused to go to ED.

## 2019-08-10 NOTE — Telephone Encounter (Signed)
Left message machine that rx for triamterene/hctz was sent to [facility] and to call if she need medication sent to local pharmacy.

## 2019-08-11 ENCOUNTER — Telehealth: Payer: Self-pay

## 2019-08-11 ENCOUNTER — Other Ambulatory Visit (HOSPITAL_BASED_OUTPATIENT_CLINIC_OR_DEPARTMENT_OTHER): Payer: Self-pay | Admitting: Family Medicine

## 2019-08-11 DIAGNOSIS — Z1231 Encounter for screening mammogram for malignant neoplasm of breast: Secondary | ICD-10-CM

## 2019-08-23 ENCOUNTER — Other Ambulatory Visit: Payer: Self-pay

## 2019-08-23 NOTE — Telephone Encounter (Signed)
Patient called in to advise that she would like to switch to the mail order pharmacy [facility].  Patient would like the following prescription refill sent  irbesartan (AVAPRO) 150 MG tablet [[id number] escitalopram (LEXAPRO) 20 MG tablet [[id number]]   triamterene-hydrochlorothiazide (MAXZIDE-25) 37.5-25 MG tablet [[id number]]   atorvastatin (LIPITOR) 80 MG tablet [[id number]  famotidine (PEPCID) 40 MG tablet [[id number]]   diclofenac (VOLTAREN) 75 MG EC tablet [[id number]]      Phone: [phone]   Provider number [phone]   Patient would like to use this pharmacy for  Now on going forward.

## 2019-08-24 MED ORDER — FAMOTIDINE 40 MG PO TABS: 40.0000 mg | ORAL_TABLET | Freq: Every day | ORAL | 1 refills | Status: DC

## 2019-08-24 MED ORDER — TRIAMTERENE-HCTZ 37.5-25 MG PO TABS: 1.0000 | ORAL_TABLET | Freq: Every day | ORAL | 1 refills | Status: DC

## 2019-08-24 MED ORDER — ESCITALOPRAM OXALATE 20 MG PO TABS: 40.0000 mg | ORAL_TABLET | Freq: Every day | ORAL | 1 refills | Status: DC

## 2019-08-24 MED ORDER — DICLOFENAC SODIUM 75 MG PO TBEC: 75.0000 mg | DELAYED_RELEASE_TABLET | Freq: Every day | ORAL | 0 refills | Status: DC | PRN

## 2019-08-24 MED ORDER — IRBESARTAN 150 MG PO TABS: 150.0000 mg | ORAL_TABLET | Freq: Every day | ORAL | 1 refills | Status: DC

## 2019-08-24 NOTE — Telephone Encounter (Signed)
Rx's sent minus atorvastatin that is prescribed by Dr. Stanford Breed.

## 2019-09-17 ENCOUNTER — Ambulatory Visit (HOSPITAL_BASED_OUTPATIENT_CLINIC_OR_DEPARTMENT_OTHER)
Admission: RE | Admit: 2019-09-17 | Discharge: 2019-09-17 | Disposition: A | Discharge status: Home / Self Care | Payer: Medicare Other | Source: Ambulatory Visit | Attending: Family Medicine | Admitting: Family Medicine

## 2019-09-17 ENCOUNTER — Other Ambulatory Visit: Payer: Self-pay

## 2019-09-17 DIAGNOSIS — Z1231 Encounter for screening mammogram for malignant neoplasm of breast: Secondary | ICD-10-CM | POA: Insufficient documentation

## 2019-10-06 ENCOUNTER — Other Ambulatory Visit: Payer: Self-pay | Admitting: Family Medicine

## 2019-10-08 ENCOUNTER — Other Ambulatory Visit: Payer: Self-pay | Admitting: *Deleted

## 2019-10-13 ENCOUNTER — Other Ambulatory Visit: Payer: Self-pay | Admitting: *Deleted

## 2019-10-13 MED ORDER — ATORVASTATIN CALCIUM 80 MG PO TABS: 80.0000 mg | ORAL_TABLET | Freq: Every day | ORAL | 0 refills | Status: DC

## 2019-11-14 ENCOUNTER — Other Ambulatory Visit: Payer: Self-pay | Admitting: Family Medicine

## 2019-11-15 ENCOUNTER — Other Ambulatory Visit: Payer: Self-pay | Admitting: Family Medicine

## 2019-11-24 DIAGNOSIS — U071 COVID-19: Secondary | ICD-10-CM | POA: Diagnosis not present

## 2019-11-24 DIAGNOSIS — R05 Cough: Secondary | ICD-10-CM | POA: Diagnosis not present

## 2019-11-24 DIAGNOSIS — Z20822 Contact with and (suspected) exposure to covid-19: Secondary | ICD-10-CM | POA: Diagnosis not present

## 2019-12-13 DIAGNOSIS — M549 Dorsalgia, unspecified: Secondary | ICD-10-CM | POA: Diagnosis not present

## 2019-12-13 DIAGNOSIS — Z791 Long term (current) use of non-steroidal anti-inflammatories (NSAID): Secondary | ICD-10-CM | POA: Diagnosis not present

## 2019-12-13 DIAGNOSIS — M5136 Other intervertebral disc degeneration, lumbar region: Secondary | ICD-10-CM | POA: Diagnosis not present

## 2019-12-13 DIAGNOSIS — M47816 Spondylosis without myelopathy or radiculopathy, lumbar region: Secondary | ICD-10-CM | POA: Diagnosis not present

## 2019-12-13 DIAGNOSIS — G8929 Other chronic pain: Secondary | ICD-10-CM | POA: Diagnosis not present

## 2019-12-13 DIAGNOSIS — M5116 Intervertebral disc disorders with radiculopathy, lumbar region: Secondary | ICD-10-CM | POA: Diagnosis not present

## 2019-12-13 LAB — BASIC METABOLIC PANEL
BUN: 35 — AB (ref 4–21)
CO2: 24 — AB (ref 13–22)
Chloride: 102 (ref 99–108)
Creatinine: 1 (ref 0.5–1.1)
Glucose: 142
Potassium: 3.8 (ref 3.4–5.3)
Sodium: 136 — AB (ref 137–147)

## 2019-12-13 LAB — COMPREHENSIVE METABOLIC PANEL
Calcium: 9 (ref 8.7–10.7)
GFR calc Af Amer: 61

## 2019-12-20 ENCOUNTER — Other Ambulatory Visit: Payer: Self-pay | Admitting: Family Medicine

## 2019-12-21 ENCOUNTER — Ambulatory Visit: Payer: Medicare Other | Admitting: Family

## 2019-12-24 ENCOUNTER — Other Ambulatory Visit: Payer: Self-pay

## 2019-12-24 ENCOUNTER — Encounter: Payer: Self-pay | Admitting: *Deleted

## 2019-12-24 ENCOUNTER — Ambulatory Visit (INDEPENDENT_AMBULATORY_CARE_PROVIDER_SITE_OTHER): Payer: Medicare Other | Admitting: Family

## 2019-12-24 VITALS — BP 121/69 | HR 86 | Temp 97.8°F | Resp 16 | Ht 67.0 in | Wt 224.0 lb

## 2019-12-24 DIAGNOSIS — E871 Hypo-osmolality and hyponatremia: Secondary | ICD-10-CM | POA: Diagnosis not present

## 2019-12-24 DIAGNOSIS — E870 Hyperosmolality and hypernatremia: Secondary | ICD-10-CM | POA: Diagnosis not present

## 2019-12-24 DIAGNOSIS — J329 Chronic sinusitis, unspecified: Secondary | ICD-10-CM | POA: Diagnosis not present

## 2019-12-24 MED ORDER — AMOXICILLIN-POT CLAVULANATE 875-125 MG PO TABS: 1.0000 | ORAL_TABLET | Freq: Two times a day (BID) | ORAL | 0 refills | Status: DC

## 2019-12-24 MED ORDER — BENZONATATE 100 MG PO CAPS: 100.0000 mg | ORAL_CAPSULE | Freq: Three times a day (TID) | ORAL | 0 refills | Status: DC | PRN

## 2019-12-24 NOTE — Progress Notes (Signed)
Subjective:    Patient ID: Brenda [name], female    DOB: [birth year], 74 y.o.   MRN: [id number]  HPI  Patient is a 74 yr old female who presents today for follow up of her lab work.  She had lab work performed on 12/13/2019.  BUN was elevated at 35, sodium mildly low at 136.  CO2 was mildly elevated at 24.   Reports that she had allergies which turned into a sinus infection.  Was treated at [facility] with "penicillin". Cough has improved some but she feels like the change in the weather is contributing.  Reports a bad smell in her nose. She did take a covid test which was negative. She takes an over the counter antihistamine.  She reports post-nasal drip.  Not currently using nasal spray.   Review of Systems See HPI  Past Medical History:  Diagnosis Date  . Depression    counseling  . Diabetes mellitus type 2 in obese ([place]) 01/31/2013  . Esophageal reflux 08/24/2012  . GERD (gastroesophageal reflux disease)   . History of chicken pox    childhood age 51  . Hyperlipidemia    2011  . Hypertension   . Left hip pain 03/19/2016  . Medicare annual wellness visit, subsequent 12/25/2014  . Numerous moles 05/09/2015  . Obesity 10/17/2010  . Osteopenia 03/19/2016  . Other and unspecified hyperlipidemia 04/26/2012  . Other and unspecified hyperlipidemia 08/24/2012  . Preventative health care 03/24/2016  . Sacral fracture ([place]) 05/27/2016  . Seasonal allergies   . Tinea pedis 03/24/2016  . Urinary incontinence 08/24/2012     Social History   Socioeconomic History  . Marital status: Divorced    Spouse name: Not on file  . Number of children: Not on file  . Years of education: Not on file  . Highest education level: Not on file  Occupational History  . Not on file  Tobacco Use  . Smoking status: Former Smoker    Packs/day: 0.20    Years: 15.00    Pack years: 3.00    Types: Cigarettes  . Smokeless tobacco: Never Used  . Tobacco comment: Qquit 10 years ago  Vaping Use  . Vaping Use:  Never used  Substance and Sexual Activity  . Alcohol use: No    Alcohol/week: 0.0 standard drinks  . Drug use: No  . Sexual activity: Never  Other Topics Concern  . Not on file  Social History Narrative  . Not on file   Social Determinants of Health   Financial Resource Strain: Low Risk   . Difficulty of Paying Living Expenses: Not hard at all  Food Insecurity: No Food Insecurity  . Worried About Charity fundraiser in the Last Year: Never true  . Ran Out of Food in the Last Year: Never true  Transportation Needs: No Transportation Needs  . Lack of Transportation (Medical): No  . Lack of Transportation (Non-Medical): No  Physical Activity:   . Days of Exercise per Week: Not on file  . Minutes of Exercise per Session: Not on file  Stress:   . Feeling of Stress : Not on file  Social Connections:   . Frequency of Communication with Friends and Family: Not on file  . Frequency of Social Gatherings with Friends and Family: Not on file  . Attends Religious Services: Not on file  . Active Member of Clubs or Organizations: Not on file  . Attends Archivist Meetings: Not on file  . Marital  Status: Not on file  Intimate Partner Violence:   . Fear of Current or Ex-Partner: Not on file  . Emotionally Abused: Not on file  . Physically Abused: Not on file  . Sexually Abused: Not on file    Past Surgical History:  Procedure Laterality Date  . BLADDER REPAIR     2002  . TOTAL ABDOMINAL HYSTERECTOMY     2002    Family History  Problem Relation Age of Onset  . Colon cancer Sister 62  . Breast cancer Sister 34  . Heart disease Father 74  . Hypertension Father   . Dementia Mother   . Cancer Paternal Grandfather        bone  . Obesity Daughter     Allergies  Allergen Reactions  . Meloxicam Nausea Only and Nausea And Vomiting    Current Outpatient Medications on File Prior to Visit  Medication Sig Dispense Refill  . acetaminophen (TYLENOL ARTHRITIS PAIN) 650 MG  CR tablet Take 1,300 mg by mouth daily.    Marland Kitchen ALPRAZolam (XANAX) 0.25 MG tablet Take 0.5-1 tablets (0.125-0.25 mg total) by mouth 2 (two) times daily as needed for anxiety. 20 tablet 1  . aspirin 81 MG tablet Take 81 mg by mouth daily.    Marland Kitchen atorvastatin (LIPITOR) 80 MG tablet TAKE 1 TABLET BY MOUTH  DAILY 90 tablet 3  . Cyanocobalamin (VITAMIN B 12 PO) Take by mouth.    . diclofenac (VOLTAREN) 75 MG EC tablet TAKE 1 TABLET BY MOUTH  DAILY AS NEEDED FOR  MODERATE PAIN 90 tablet 0  . docusate sodium (COLACE) 100 MG capsule Take 100 mg by mouth 2 (two) times daily.    Marland Kitchen escitalopram (LEXAPRO) 20 MG tablet TAKE 2 TABLETS BY MOUTH  DAILY 180 tablet 3  . famotidine (PEPCID) 40 MG tablet TAKE 1 TABLET BY MOUTH  DAILY 90 tablet 3  . irbesartan (AVAPRO) 150 MG tablet TAKE 1 TABLET BY MOUTH  DAILY 90 tablet 3  . KRILL OIL PO Take 1 capsule by mouth daily.    Marland Kitchen OVER THE COUNTER MEDICATION Hemorrohoid supporsitories-As directed    . Probiotic Product (PROBIOTIC DAILY) CAPS Take 1 capsule by mouth daily.    . psyllium (METAMUCIL) 58.6 % powder Take 1 packet by mouth 3 (three) times daily.    . traMADol (ULTRAM) 50 MG tablet Take 50 mg by mouth every 6 (six) hours as needed. for pain    . triamterene-hydrochlorothiazide (MAXZIDE-25) 37.5-25 MG tablet TAKE 1 TABLET BY MOUTH  DAILY 90 tablet 3  . TURMERIC PO Take 1 capsule by mouth daily.     No current facility-administered medications on file prior to visit.    BP 121/69 (BP Location: Left Arm, Patient Position: Sitting, Cuff Size: Large)   Pulse 86   Temp 97.8 F (36.6 C) (Oral)   Resp 16   Ht 5\' 7"  (1.702 m)   Wt 224 lb (101.6 kg)   SpO2 97%   BMI 35.08 kg/m       Objective:   Physical Exam Constitutional:      Appearance: Normal appearance. She is well-developed.  Cardiovascular:     Rate and Rhythm: Normal rate and regular rhythm.     Heart sounds: Normal heart sounds. No murmur heard.   Pulmonary:     Effort: Pulmonary effort is  normal. No respiratory distress.     Breath sounds: Normal breath sounds. No wheezing.  Neurological:     Mental Status: She is alert.  Psychiatric:        Behavior: Behavior normal.        Thought Content: Thought content normal.        Judgment: Judgment normal.           Assessment & Plan:  Sinusitis- will rx with augmentin.   Allergic rhinitis- Restart claritin 10mg  once daily.  Add flonase.  Renal insufficiency- elevated BUN on recent lab work performed by her orthopedist. This is not far from her baseline which I explained to her.Recommended d/c voltaren and continued strict blood pressure control.  This visit occurred during the SARS-CoV-2 public health emergency.  Safety protocols were in place, including screening questions prior to the visit, additional usage of staff PPE, and extensive cleaning of exam room while observing appropriate contact time as indicated for disinfecting solutions.

## 2019-12-24 NOTE — Patient Instructions (Addendum)
Add flonase 2 sprays each nostril once daily. Continue claritin 10mg  once daily.  Begin augmentin twice daily (antibiotic) for sinus infection. Stop Diclofenac as this medication is tough on your kidneys. You may continue tylenol as needed.  You may use the tessalon as needed for cough.  Please call if symptoms worsen or if symptoms fail to improve.

## 2019-12-25 LAB — BASIC METABOLIC PANEL
BUN: 24 mg/dL (ref 7–25)
CO2: 26 mmol/L (ref 20–32)
Calcium: 9.7 mg/dL (ref 8.6–10.4)
Chloride: 106 mmol/L (ref 98–110)
Creat: 0.92 mg/dL (ref 0.60–0.93)
Glucose, Bld: 113 mg/dL — ABNORMAL HIGH (ref 65–99)
Potassium: 4.1 mmol/L (ref 3.5–5.3)
Sodium: 139 mmol/L (ref 135–146)

## 2019-12-27 ENCOUNTER — Encounter: Payer: Self-pay | Admitting: Family

## 2019-12-28 NOTE — Progress Notes (Signed)
Mailed out to patient 

## 2020-01-04 DIAGNOSIS — I10 Essential (primary) hypertension: Secondary | ICD-10-CM | POA: Diagnosis not present

## 2020-01-04 DIAGNOSIS — M5136 Other intervertebral disc degeneration, lumbar region: Secondary | ICD-10-CM | POA: Diagnosis not present

## 2020-01-04 DIAGNOSIS — M62830 Muscle spasm of back: Secondary | ICD-10-CM | POA: Diagnosis not present

## 2020-01-04 DIAGNOSIS — M47816 Spondylosis without myelopathy or radiculopathy, lumbar region: Secondary | ICD-10-CM | POA: Diagnosis not present

## 2020-01-04 DIAGNOSIS — E119 Type 2 diabetes mellitus without complications: Secondary | ICD-10-CM | POA: Diagnosis not present

## 2020-02-17 ENCOUNTER — Other Ambulatory Visit: Payer: Self-pay | Admitting: Family Medicine

## 2020-04-24 DIAGNOSIS — H43393 Other vitreous opacities, bilateral: Secondary | ICD-10-CM | POA: Diagnosis not present

## 2020-04-24 DIAGNOSIS — E119 Type 2 diabetes mellitus without complications: Secondary | ICD-10-CM | POA: Diagnosis not present

## 2020-04-24 DIAGNOSIS — H1189 Other specified disorders of conjunctiva: Secondary | ICD-10-CM | POA: Diagnosis not present

## 2020-04-24 DIAGNOSIS — H2513 Age-related nuclear cataract, bilateral: Secondary | ICD-10-CM | POA: Diagnosis not present

## 2020-04-24 DIAGNOSIS — H52203 Unspecified astigmatism, bilateral: Secondary | ICD-10-CM | POA: Diagnosis not present

## 2020-04-24 DIAGNOSIS — H524 Presbyopia: Secondary | ICD-10-CM | POA: Diagnosis not present

## 2020-04-24 DIAGNOSIS — H5203 Hypermetropia, bilateral: Secondary | ICD-10-CM | POA: Diagnosis not present

## 2020-04-24 LAB — HM DIABETES EYE EXAM

## 2020-05-09 DIAGNOSIS — Z79899 Other long term (current) drug therapy: Secondary | ICD-10-CM | POA: Diagnosis not present

## 2020-05-09 DIAGNOSIS — I10 Essential (primary) hypertension: Secondary | ICD-10-CM | POA: Diagnosis not present

## 2020-05-09 DIAGNOSIS — E1169 Type 2 diabetes mellitus with other specified complication: Secondary | ICD-10-CM | POA: Diagnosis not present

## 2020-05-09 DIAGNOSIS — M5116 Intervertebral disc disorders with radiculopathy, lumbar region: Secondary | ICD-10-CM | POA: Diagnosis not present

## 2020-05-09 DIAGNOSIS — M47817 Spondylosis without myelopathy or radiculopathy, lumbosacral region: Secondary | ICD-10-CM | POA: Diagnosis not present

## 2020-05-09 DIAGNOSIS — M47816 Spondylosis without myelopathy or radiculopathy, lumbar region: Secondary | ICD-10-CM | POA: Diagnosis not present

## 2020-05-16 ENCOUNTER — Ambulatory Visit: Payer: Medicare Other | Admitting: Family Medicine

## 2020-06-05 ENCOUNTER — Ambulatory Visit (INDEPENDENT_AMBULATORY_CARE_PROVIDER_SITE_OTHER): Payer: Medicare Other | Admitting: Family Medicine

## 2020-06-05 ENCOUNTER — Other Ambulatory Visit: Payer: Self-pay

## 2020-06-05 VITALS — BP 110/76 | HR 84 | Temp 97.9°F | Resp 12 | Ht 67.0 in | Wt 223.4 lb

## 2020-06-05 DIAGNOSIS — E119 Type 2 diabetes mellitus without complications: Secondary | ICD-10-CM

## 2020-06-05 DIAGNOSIS — R350 Frequency of micturition: Secondary | ICD-10-CM | POA: Diagnosis not present

## 2020-06-05 DIAGNOSIS — E1169 Type 2 diabetes mellitus with other specified complication: Secondary | ICD-10-CM

## 2020-06-05 DIAGNOSIS — E782 Mixed hyperlipidemia: Secondary | ICD-10-CM | POA: Diagnosis not present

## 2020-06-05 DIAGNOSIS — I10 Essential (primary) hypertension: Secondary | ICD-10-CM | POA: Diagnosis not present

## 2020-06-05 DIAGNOSIS — E669 Obesity, unspecified: Secondary | ICD-10-CM | POA: Diagnosis not present

## 2020-06-05 DIAGNOSIS — K219 Gastro-esophageal reflux disease without esophagitis: Secondary | ICD-10-CM | POA: Diagnosis not present

## 2020-06-05 DIAGNOSIS — R32 Unspecified urinary incontinence: Secondary | ICD-10-CM | POA: Diagnosis not present

## 2020-06-05 NOTE — Assessment & Plan Note (Signed)
Encouraged heart healthy diet, increase exercise, avoid trans fats, consider a krill oil cap daily. Tolerating Atorvastatin 

## 2020-06-05 NOTE — Patient Instructions (Signed)
https://doi.[email or web address]">  Chronic Back Pain When back pain lasts longer than 3 months, it is called chronic back pain. The cause of your back pain may not be known. Some common causes include:  Wear and tear (degenerative disease) of the bones, ligaments, or disks in your back.  Inflammation and stiffness in your back (arthritis). People who have chronic back pain often go through certain periods in which the pain is more intense (flare-ups). Many people can learn to manage the pain with home care. Follow these instructions at home: Pay attention to any changes in your symptoms. Take these actions to help with your pain: Managing pain and stiffness  If directed, apply ice to the painful area. Your health care provider may recommend applying ice during the first 24-48 hours after a flare-up begins. To do this: ? Put ice in a plastic bag. ? Place a towel between your skin and the bag. ? Leave the ice on for 20 minutes, 2-3 times per day.  If directed, apply heat to the affected area as often as told by your health care provider. Use the heat source that your health care provider recommends, such as a moist heat pack or a heating pad. ? Place a towel between your skin and the heat source. ? Leave the heat on for 20-30 minutes. ? Remove the heat if your skin turns bright red. This is especially important if you are unable to feel pain, heat, or cold. You may have a greater risk of getting burned.  Try soaking in a warm tub.      Activity  Avoid bending and other activities that make the problem worse.  Maintain a proper position when standing or sitting: ? When standing, keep your upper back and neck straight, with your shoulders pulled back. Avoid slouching. ? When sitting, keep your back straight and relax your shoulders. Do not round your shoulders or pull them backward.  Do not sit or stand in one place for long periods of time.  Take brief periods of rest  throughout the day. This will reduce your pain. Resting in a lying or standing position is usually better than sitting to rest.  When you are resting for longer periods, mix in some mild activity or stretching between periods of rest. This will help to prevent stiffness and pain.  Get regular exercise. Ask your health care provider what activities are safe for you.  Do not lift anything that is heavier than 10 lb (4.5 kg), or the limit that you are told, until your health care provider says that it is safe. Always use proper lifting technique, which includes: ? Bending your knees. ? Keeping the load close to your body. ? Avoiding twisting.  Sleep on a firm mattress in a comfortable position. Try lying on your side with your knees slightly bent. If you lie on your back, put a pillow under your knees.   Medicines  Treatment may include medicines for pain and inflammation taken by mouth or applied to the skin, prescription pain medicine, or muscle relaxants. Take over-the-counter and prescription medicines only as told by your health care provider.  Ask your health care provider if the medicine prescribed to you: ? Requires you to avoid driving or using machinery. ? Can cause constipation. You may need to take these actions to prevent or treat constipation:  Drink enough fluid to keep your urine pale yellow.  Take over-the-counter or prescription medicines.  Eat foods that are high in fiber, such as   beans, whole grains, and fresh fruits and vegetables.  Limit foods that are high in fat and processed sugars, such as fried or sweet foods. General instructions  Do not use any products that contain nicotine or tobacco, such as cigarettes, e-cigarettes, and chewing tobacco. If you need help quitting, ask your health care provider.  Keep all follow-up visits as told by your health care provider. This is important. Contact a health care provider if:  You have pain that is not relieved with  rest or medicine.  Your pain gets worse, or you have new pain.  You have a high fever.  You have rapid weight loss.  You have trouble doing your normal activities. Get help right away if:  You have weakness or numbness in one or both of your legs or feet.  You have trouble controlling your bladder or your bowels.  You have severe back pain and have any of the following: ? Nausea or vomiting. ? Pain in your abdomen. ? Shortness of breath or you faint. Summary  Chronic back pain is back pain that lasts longer than 3 months.  When a flare-up begins, apply ice to the painful area for the first 24-48 hours.  Apply a moist heat pad or use a heating pad on the painful area as directed by your health care provider.  When you are resting for longer periods, mix in some mild activity or stretching between periods of rest. This will help to prevent stiffness and pain. This information is not intended to replace advice given to you by your health care provider. Make sure you discuss any questions you have with your health care provider. Document Revised: 03/31/2019 Document Reviewed: 03/31/2019 Elsevier Patient Education  2021 [employer]  

## 2020-06-05 NOTE — Progress Notes (Signed)
Subjective:    Patient ID: Brenda [name], female    DOB: Sep 14, 1945, 75 y.o.   MRN: [id number]  Chief Complaint  Patient presents with  . 6 month follow up     HPI Patient is in today for follow up on chronic medical concerns, no recent febrile illness or hospitalizations. She is most concerned worsening urinary incontinence. No dysuria or hematuria. No fevers or chlls. No back or flank pain. Has been worsening over a long period of time and is frequently a gush as opposed to a dribble. Otherwise she reports feeling well. No change in bowel habits. She is trying to eat well and stays active. Denies CP/palp/SOB/HA/congestion/fevers/GI c/o. Taking meds as prescribed  Past Medical History:  Diagnosis Date  . Depression    counseling  . Diabetes mellitus type 2 in obese ([place]) 01/31/2013  . Esophageal reflux 08/24/2012  . GERD (gastroesophageal reflux disease)   . History of chicken pox    childhood age 81  . Hyperlipidemia    2011  . Hypertension   . Left hip pain 03/19/2016  . Medicare annual wellness visit, subsequent 12/25/2014  . Numerous moles 05/09/2015  . Obesity 10/17/2010  . Osteopenia 03/19/2016  . Other and unspecified hyperlipidemia 04/26/2012  . Other and unspecified hyperlipidemia 08/24/2012  . Preventative health care 03/24/2016  . Sacral fracture ([place]) 05/27/2016  . Seasonal allergies   . Tinea pedis 03/24/2016  . Urinary incontinence 08/24/2012    Past Surgical History:  Procedure Laterality Date  . BLADDER REPAIR     2002  . TOTAL ABDOMINAL HYSTERECTOMY     2002    Family History  Problem Relation Age of Onset  . Colon cancer Sister 65  . Breast cancer Sister 9  . Heart disease Father 25  . Hypertension Father   . Dementia Mother   . Cancer Paternal Grandfather        bone  . Obesity Daughter     Social History   Socioeconomic History  . Marital status: Divorced    Spouse name: Not on file  . Number of children: Not on file  . Years of education:  Not on file  . Highest education level: Not on file  Occupational History  . Not on file  Tobacco Use  . Smoking status: Former Smoker    Packs/day: 0.20    Years: 15.00    Pack years: 3.00    Types: Cigarettes  . Smokeless tobacco: Never Used  . Tobacco comment: Qquit 10 years ago  Vaping Use  . Vaping Use: Never used  Substance and Sexual Activity  . Alcohol use: No    Alcohol/week: 0.0 standard drinks  . Drug use: No  . Sexual activity: Never  Other Topics Concern  . Not on file  Social History Narrative  . Not on file   Social Determinants of Health   Financial Resource Strain: Not on file  Food Insecurity: Not on file  Transportation Needs: Not on file  Physical Activity: Not on file  Stress: Not on file  Social Connections: Not on file  Intimate Partner Violence: Not on file    Outpatient Medications Prior to Visit  Medication Sig Dispense Refill  . acetaminophen (TYLENOL) 650 MG CR tablet Take 1,300 mg by mouth daily.    Marland Kitchen ALPRAZolam (XANAX) 0.25 MG tablet Take 0.5-1 tablets (0.125-0.25 mg total) by mouth 2 (two) times daily as needed for anxiety. 20 tablet 1  . aspirin 81 MG tablet Take  81 mg by mouth daily.    Marland Kitchen atorvastatin (LIPITOR) 80 MG tablet TAKE 1 TABLET BY MOUTH  DAILY 90 tablet 3  . benzonatate (TESSALON) 100 MG capsule Take 1 capsule (100 mg total) by mouth 3 (three) times daily as needed. 20 capsule 0  . Cyanocobalamin (VITAMIN B 12 PO) Take by mouth.    . cyclobenzaprine (FLEXERIL) 10 MG tablet Take 1 tablet by mouth 2 (two) times daily as needed for muscle spasms.    . diclofenac (VOLTAREN) 75 MG EC tablet TAKE 1 TABLET BY MOUTH  DAILY AS NEEDED FOR  MODERATE PAIN 90 tablet 3  . docusate sodium (COLACE) 100 MG capsule Take 100 mg by mouth 2 (two) times daily.    Marland Kitchen escitalopram (LEXAPRO) 20 MG tablet TAKE 2 TABLETS BY MOUTH  DAILY 180 tablet 3  . famotidine (PEPCID) 40 MG tablet TAKE 1 TABLET BY MOUTH  DAILY 90 tablet 3  . irbesartan (AVAPRO) 150  MG tablet TAKE 1 TABLET BY MOUTH  DAILY 90 tablet 3  . KRILL OIL PO Take 1 capsule by mouth daily.    Marland Kitchen OVER THE COUNTER MEDICATION Hemorrohoid supporsitories-As directed    . Probiotic Product (PROBIOTIC DAILY) CAPS Take 1 capsule by mouth daily.    . psyllium (METAMUCIL) 58.6 % powder Take 1 packet by mouth 3 (three) times daily.    . traMADol (ULTRAM) 50 MG tablet Take 50 mg by mouth every 6 (six) hours as needed. for pain    . triamterene-hydrochlorothiazide (MAXZIDE-25) 37.5-25 MG tablet TAKE 1 TABLET BY MOUTH  DAILY 90 tablet 3  . TURMERIC PO Take 1 capsule by mouth daily.    Marland Kitchen amoxicillin-clavulanate (AUGMENTIN) 875-125 MG tablet Take 1 tablet by mouth 2 (two) times daily. 20 tablet 0   No facility-administered medications prior to visit.    Allergies  Allergen Reactions  . Meloxicam Nausea Only and Nausea And Vomiting    Review of Systems  Constitutional: Negative for fever and malaise/fatigue.  HENT: Negative for congestion.   Eyes: Negative for blurred vision.  Respiratory: Negative for shortness of breath.   Cardiovascular: Negative for chest pain, palpitations and leg swelling.  Gastrointestinal: Negative for abdominal pain, blood in stool and nausea.  Genitourinary: Positive for frequency and urgency. Negative for dysuria, flank pain and hematuria.  Musculoskeletal: Negative for falls.  Skin: Negative for rash.  Neurological: Negative for dizziness, loss of consciousness and headaches.  Endo/Heme/Allergies: Negative for environmental allergies.  Psychiatric/Behavioral: Negative for depression. The patient is not nervous/anxious.        Objective:    Physical Exam Vitals and nursing note reviewed.  Constitutional:      General: She is not in acute distress.    Appearance: She is well-developed.  HENT:     Head: Normocephalic and atraumatic.     Nose: Nose normal.  Eyes:     General:        Right eye: No discharge.        Left eye: No discharge.   Cardiovascular:     Rate and Rhythm: Normal rate and regular rhythm.     Heart sounds: No murmur heard.   Pulmonary:     Effort: Pulmonary effort is normal.     Breath sounds: Normal breath sounds.  Abdominal:     General: Bowel sounds are normal.     Palpations: Abdomen is soft.     Tenderness: There is no abdominal tenderness.  Musculoskeletal:     Cervical back:  Normal range of motion and neck supple.  Skin:    General: Skin is warm and dry.  Neurological:     Mental Status: She is alert and oriented to person, place, and time.     BP 110/76 (BP Location: Left Arm, Cuff Size: Large)   Pulse 84   Temp 97.9 F (36.6 C) (Oral)   Resp 12   Ht 5\' 7"  (1.702 m)   Wt 223 lb 6.4 oz (101.3 kg)   SpO2 93%   BMI 34.99 kg/m  Wt Readings from Last 3 Encounters:  06/05/20 223 lb 6.4 oz (101.3 kg)  12/24/19 224 lb (101.6 kg)  01/15/19 216 lb (98 kg)    Diabetic Foot Exam - Simple   No data filed    Lab Results  Component Value Date   WBC 4.2 09/28/2018   HGB 14.5 09/28/2018   HCT 44.4 09/28/2018   PLT 346.0 09/28/2018   GLUCOSE 113 (H) 12/24/2019   CHOL 150 09/28/2018   TRIG 115.0 09/28/2018   HDL 50.40 09/28/2018   LDLCALC 77 09/28/2018   ALT 14 09/28/2018   AST 14 09/28/2018   NA 139 12/24/2019   K 4.1 12/24/2019   CL 106 12/24/2019   CREATININE 0.92 12/24/2019   BUN 24 12/24/2019   CO2 26 12/24/2019   TSH 1.78 09/28/2018   HGBA1C 6.8 (H) 09/28/2018   MICROALBUR 2.3 (H) 03/27/2015    Lab Results  Component Value Date   TSH 1.78 09/28/2018   Lab Results  Component Value Date   WBC 4.2 09/28/2018   HGB 14.5 09/28/2018   HCT 44.4 09/28/2018   MCV 91.7 09/28/2018   PLT 346.0 09/28/2018   Lab Results  Component Value Date   NA 139 12/24/2019   K 4.1 12/24/2019   CO2 26 12/24/2019   GLUCOSE 113 (H) 12/24/2019   BUN 24 12/24/2019   CREATININE 0.92 12/24/2019   BILITOT 0.4 09/28/2018   ALKPHOS 69 09/28/2018   AST 14 09/28/2018   ALT 14 09/28/2018    PROT 6.9 09/28/2018   ALBUMIN 4.2 09/28/2018   CALCIUM 9.7 12/24/2019   GFR 58.92 (L) 09/28/2018   Lab Results  Component Value Date   CHOL 150 09/28/2018   Lab Results  Component Value Date   HDL 50.40 09/28/2018   Lab Results  Component Value Date   LDLCALC 77 09/28/2018   Lab Results  Component Value Date   TRIG 115.0 09/28/2018   Lab Results  Component Value Date   CHOLHDL 3 09/28/2018   Lab Results  Component Value Date   HGBA1C 6.8 (H) 09/28/2018       Assessment & Plan:   Problem List Items Addressed This Visit    Hypertension - Primary    Well controlled, no changes to meds. Encouraged heart healthy diet such as the DASH diet and exercise as tolerated.       Relevant Orders   CBC   Comprehensive metabolic panel   TSH   Hyperlipidemia    Encouraged heart healthy diet, increase exercise, avoid trans fats, consider a krill oil cap daily. Tolerating Atorvastatin      Urinary incontinence    Has become more frequent and can be a full gush, no dysuria or hematuria and has been worsening over along period of time. Will refer to urology for further evaluation      Relevant Orders   Ambulatory referral to Urology   Diabetes mellitus type 2 in obese ([place])  hgba1c acceptable, minimize simple carbs. Increase exercise as tolerated. Continue current meds      Relevant Orders   Hemoglobin A1c   Comprehensive metabolic panel   Lipid panel   TSH   GERD (gastroesophageal reflux disease)   Relevant Orders   TSH    Other Visit Diagnoses    Urinary frequency       Relevant Orders   Ambulatory referral to Urology      I have discontinued Valla Pacey. Hearst's amoxicillin-clavulanate. I am also having her maintain her aspirin, Probiotic Daily, TURMERIC PO, acetaminophen, OVER THE COUNTER MEDICATION, Cyanocobalamin (VITAMIN B 12 PO), traMADol, ALPRAZolam, KRILL OIL PO, atorvastatin, escitalopram, irbesartan, famotidine, triamterene-hydrochlorothiazide,  psyllium, docusate sodium, benzonatate, diclofenac, and cyclobenzaprine.  No orders of the defined types were placed in this encounter.    Penni Homans, MD

## 2020-06-05 NOTE — Assessment & Plan Note (Signed)
Well controlled, no changes to meds. Encouraged heart healthy diet such as the DASH diet and exercise as tolerated.  °

## 2020-06-05 NOTE — Assessment & Plan Note (Signed)
Has become more frequent and can be a full gush, no dysuria or hematuria and has been worsening over along period of time. Will refer to urology for further evaluation

## 2020-06-05 NOTE — Assessment & Plan Note (Signed)
hgba1c acceptable, minimize simple carbs. Increase exercise as tolerated. Continue current meds 

## 2020-06-06 LAB — COMPREHENSIVE METABOLIC PANEL
ALT: 19 U/L (ref 0–35)
AST: 15 U/L (ref 0–37)
Albumin: 4.2 g/dL (ref 3.5–5.2)
Alkaline Phosphatase: 71 U/L (ref 39–117)
BUN: 27 mg/dL — ABNORMAL HIGH (ref 6–23)
CO2: 26 mEq/L (ref 19–32)
Calcium: 10 mg/dL (ref 8.4–10.5)
Chloride: 99 mEq/L (ref 96–112)
Creatinine, Ser: 1.41 mg/dL — ABNORMAL HIGH (ref 0.40–1.20)
GFR: 36.72 mL/min — ABNORMAL LOW (ref >60.00)
Glucose, Bld: 189 mg/dL — ABNORMAL HIGH (ref 70–99)
Potassium: 4.5 mEq/L (ref 3.5–5.1)
Sodium: 138 mEq/L (ref 135–145)
Total Bilirubin: 0.6 mg/dL (ref 0.2–1.2)
Total Protein: 7.4 g/dL (ref 6.0–8.3)

## 2020-06-06 LAB — LIPID PANEL
Cholesterol: 152 mg/dL (ref 0–200)
HDL: 55.3 mg/dL (ref >39.00)
LDL Cholesterol: 79 mg/dL (ref 0–99)
NonHDL: 96.75
Total CHOL/HDL Ratio: 3
Triglycerides: 90 mg/dL (ref 0.0–149.0)
VLDL: 18 mg/dL (ref 0.0–40.0)

## 2020-06-06 LAB — CBC
HCT: 44.2 % (ref 36.0–46.0)
Hemoglobin: 14.9 g/dL (ref 12.0–15.0)
MCHC: 33.6 g/dL (ref 30.0–36.0)
MCV: 91.1 fl (ref 78.0–100.0)
Platelets: 352 10*3/uL (ref 150.0–400.0)
RBC: 4.86 Mil/uL (ref 3.87–5.11)
RDW: 15 % (ref 11.5–15.5)
WBC: 5.5 10*3/uL (ref 4.0–10.5)

## 2020-06-06 LAB — TSH: TSH: 1.82 u[IU]/mL (ref 0.35–4.50)

## 2020-06-06 LAB — HEMOGLOBIN A1C: Hgb A1c MFr Bld: 7.5 % — ABNORMAL HIGH (ref 4.6–6.5)

## 2020-06-07 ENCOUNTER — Telehealth: Payer: Self-pay

## 2020-06-07 DIAGNOSIS — E782 Mixed hyperlipidemia: Secondary | ICD-10-CM

## 2020-06-07 NOTE — Telephone Encounter (Signed)
Pt is aware of lab results and scheduled for cmp recheck. Pt wanted labs emailed. done

## 2020-06-13 ENCOUNTER — Encounter: Payer: Self-pay | Admitting: *Deleted

## 2020-06-21 ENCOUNTER — Other Ambulatory Visit (INDEPENDENT_AMBULATORY_CARE_PROVIDER_SITE_OTHER): Payer: Medicare Other

## 2020-06-21 ENCOUNTER — Other Ambulatory Visit: Payer: Self-pay

## 2020-06-21 DIAGNOSIS — E782 Mixed hyperlipidemia: Secondary | ICD-10-CM

## 2020-06-21 NOTE — Addendum Note (Signed)
Addended by: Kelle Darting A on: 06/21/2020 03:11 PM   Modules accepted: Orders

## 2020-06-22 LAB — COMPREHENSIVE METABOLIC PANEL
ALT: 20 U/L (ref 0–35)
AST: 18 U/L (ref 0–37)
Albumin: 3.9 g/dL (ref 3.5–5.2)
Alkaline Phosphatase: 83 U/L (ref 39–117)
BUN: 31 mg/dL — ABNORMAL HIGH (ref 6–23)
CO2: 27 mEq/L (ref 19–32)
Calcium: 9.2 mg/dL (ref 8.4–10.5)
Chloride: 101 mEq/L (ref 96–112)
Creatinine, Ser: 1.25 mg/dL — ABNORMAL HIGH (ref 0.40–1.20)
GFR: 42.41 mL/min — ABNORMAL LOW (ref >60.00)
Glucose, Bld: 124 mg/dL — ABNORMAL HIGH (ref 70–99)
Potassium: 4 mEq/L (ref 3.5–5.1)
Sodium: 139 mEq/L (ref 135–145)
Total Bilirubin: 0.3 mg/dL (ref 0.2–1.2)
Total Protein: 6.8 g/dL (ref 6.0–8.3)

## 2020-06-26 ENCOUNTER — Telehealth: Payer: Self-pay | Admitting: Family Medicine

## 2020-06-26 NOTE — Telephone Encounter (Signed)
Labs sent to home address

## 2020-06-26 NOTE — Telephone Encounter (Signed)
Patient states she would like most recent lab results mailed to her home address.

## 2020-07-18 ENCOUNTER — Encounter: Payer: Self-pay | Admitting: Family Medicine

## 2020-08-21 ENCOUNTER — Other Ambulatory Visit (HOSPITAL_BASED_OUTPATIENT_CLINIC_OR_DEPARTMENT_OTHER): Payer: Self-pay | Admitting: Family Medicine

## 2020-08-21 DIAGNOSIS — Z1231 Encounter for screening mammogram for malignant neoplasm of breast: Secondary | ICD-10-CM

## 2020-08-23 ENCOUNTER — Other Ambulatory Visit: Payer: Self-pay

## 2020-08-23 ENCOUNTER — Ambulatory Visit (INDEPENDENT_AMBULATORY_CARE_PROVIDER_SITE_OTHER): Payer: Medicare Other

## 2020-08-23 VITALS — BP 110/68 | HR 98 | Temp 97.4°F | Resp 16 | Ht 67.0 in | Wt 223.6 lb

## 2020-08-23 DIAGNOSIS — Z0000 Encounter for general adult medical examination without abnormal findings: Secondary | ICD-10-CM

## 2020-08-23 DIAGNOSIS — Z780 Asymptomatic menopausal state: Secondary | ICD-10-CM

## 2020-08-23 NOTE — Patient Instructions (Signed)
Brenda [name] , Thank you for taking time to come for your Medicare Wellness Visit. I appreciate your ongoing commitment to your health goals. Please review the following plan we discussed and let me know if I can assist you in the future.   Screening recommendations/referrals: Colonoscopy: Completed 04/22/2016-Due 04/22/2021 Mammogram: Completed 09/17/2019-Due 09/17/2019 Bone Density: Ordered today. Someone will be calling you to schedule. Recommended yearly ophthalmology/optometry visit for glaucoma screening and checkup Recommended yearly dental visit for hygiene and checkup  Vaccinations: Influenza vaccine: Up to date Pneumococcal vaccine: Up to date Tdap vaccine: Up to date-Due-04/16/2021 Shingles vaccine: Discuss with pharmacy   Covid-19:Up to date  Advanced directives: Information given today.  Conditions/risks identified: See problem list  Next appointment: Follow up in one year for your annual wellness visit 08/28/2021 @ 2:20   Preventive Care 65 Years and Older, Female Preventive care refers to lifestyle choices and visits with your health care provider that can promote health and wellness. What does preventive care include? A yearly physical exam. This is also called an annual well check. Dental exams once or twice a year. Routine eye exams. Ask your health care provider how often you should have your eyes checked. Personal lifestyle choices, including: Daily care of your teeth and gums. Regular physical activity. Eating a healthy diet. Avoiding tobacco and drug use. Limiting alcohol use. Practicing safe sex. Taking low-dose aspirin every day. Taking vitamin and mineral supplements as recommended by your health care provider. What happens during an annual well check? The services and screenings done by your health care provider during your annual well check will depend on your age, overall health, lifestyle risk factors, and family history of disease. Counseling  Your  health care provider may ask you questions about your: Alcohol use. Tobacco use. Drug use. Emotional well-being. Home and relationship well-being. Sexual activity. Eating habits. History of falls. Memory and ability to understand (cognition). Work and work Statistician. Reproductive health. Screening  You may have the following tests or measurements: Height, weight, and BMI. Blood pressure. Lipid and cholesterol levels. These may be checked every 5 years, or more frequently if you are over 89 years old. Skin check. Lung cancer screening. You may have this screening every year starting at age 21 if you have a 30-pack-year history of smoking and currently smoke or have quit within the past 15 years. Fecal occult blood test (FOBT) of the stool. You may have this test every year starting at age 30. Flexible sigmoidoscopy or colonoscopy. You may have a sigmoidoscopy every 5 years or a colonoscopy every 10 years starting at age 74. Hepatitis C blood test. Hepatitis B blood test. Sexually transmitted disease (STD) testing. Diabetes screening. This is done by checking your blood sugar (glucose) after you have not eaten for a while (fasting). You may have this done every 1-3 years. Bone density scan. This is done to screen for osteoporosis. You may have this done starting at age 27. Mammogram. This may be done every 1-2 years. Talk to your health care provider about how often you should have regular mammograms. Talk with your health care provider about your test results, treatment options, and if necessary, the need for more tests. Vaccines  Your health care provider may recommend certain vaccines, such as: Influenza vaccine. This is recommended every year. Tetanus, diphtheria, and acellular pertussis (Tdap, Td) vaccine. You may need a Td booster every 10 years. Zoster vaccine. You may need this after age 35. Pneumococcal 13-valent conjugate (PCV13) vaccine. One dose  is recommended after age  65. Pneumococcal polysaccharide (PPSV23) vaccine. One dose is recommended after age 7. Talk to your health care provider about which screenings and vaccines you need and how often you need them. This information is not intended to replace advice given to you by your health care provider. Make sure you discuss any questions you have with your health care provider. Document Released: 03/17/2015 Document Revised: 11/08/2015 Document Reviewed: 12/20/2014 Elsevier Interactive Patient Education  2017 [place] Prevention in the Home Falls can cause injuries. They can happen to people of all ages. There are many things you can do to make your home safe and to help prevent falls. What can I do on the outside of my home? Regularly fix the edges of walkways and driveways and fix any cracks. Remove anything that might make you trip as you walk through a door, such as a raised step or threshold. Trim any bushes or trees on the path to your home. Use bright outdoor lighting. Clear any walking paths of anything that might make someone trip, such as rocks or tools. Regularly check to see if handrails are loose or broken. Make sure that both sides of any steps have handrails. Any raised decks and porches should have guardrails on the edges. Have any leaves, snow, or ice cleared regularly. Use sand or salt on walking paths during winter. Clean up any spills in your garage right away. This includes oil or grease spills. What can I do in the bathroom? Use night lights. Install grab bars by the toilet and in the tub and shower. Do not use towel bars as grab bars. Use non-skid mats or decals in the tub or shower. If you need to sit down in the shower, use a plastic, non-slip stool. Keep the floor dry. Clean up any water that spills on the floor as soon as it happens. Remove soap buildup in the tub or shower regularly. Attach bath mats securely with double-sided non-slip rug tape. Do not have throw  rugs and other things on the floor that can make you trip. What can I do in the bedroom? Use night lights. Make sure that you have a light by your bed that is easy to reach. Do not use any sheets or blankets that are too big for your bed. They should not hang down onto the floor. Have a firm chair that has side arms. You can use this for support while you get dressed. Do not have throw rugs and other things on the floor that can make you trip. What can I do in the kitchen? Clean up any spills right away. Avoid walking on wet floors. Keep items that you use a lot in easy-to-reach places. If you need to reach something above you, use a strong step stool that has a grab bar. Keep electrical cords out of the way. Do not use floor polish or wax that makes floors slippery. If you must use wax, use non-skid floor wax. Do not have throw rugs and other things on the floor that can make you trip. What can I do with my stairs? Do not leave any items on the stairs. Make sure that there are handrails on both sides of the stairs and use them. Fix handrails that are broken or loose. Make sure that handrails are as long as the stairways. Check any carpeting to make sure that it is firmly attached to the stairs. Fix any carpet that is loose or worn. Avoid  having throw rugs at the top or bottom of the stairs. If you do have throw rugs, attach them to the floor with carpet tape. Make sure that you have a light switch at the top of the stairs and the bottom of the stairs. If you do not have them, ask someone to add them for you. What else can I do to help prevent falls? Wear shoes that: Do not have high heels. Have rubber bottoms. Are comfortable and fit you well. Are closed at the toe. Do not wear sandals. If you use a stepladder: Make sure that it is fully opened. Do not climb a closed stepladder. Make sure that both sides of the stepladder are locked into place. Ask someone to hold it for you, if  possible. Clearly mark and make sure that you can see: Any grab bars or handrails. First and last steps. Where the edge of each step is. Use tools that help you move around (mobility aids) if they are needed. These include: Canes. Walkers. Scooters. Crutches. Turn on the lights when you go into a dark area. Replace any light bulbs as soon as they burn out. Set up your furniture so you have a clear path. Avoid moving your furniture around. If any of your floors are uneven, fix them. If there are any pets around you, be aware of where they are. Review your medicines with your doctor. Some medicines can make you feel dizzy. This can increase your chance of falling. Ask your doctor what other things that you can do to help prevent falls. This information is not intended to replace advice given to you by your health care provider. Make sure you discuss any questions you have with your health care provider. Document Released: 12/15/2008 Document Revised: 07/27/2015 Document Reviewed: 03/25/2014 Elsevier Interactive Patient Education  2017 [employer].

## 2020-08-23 NOTE — Progress Notes (Signed)
Subjective:   Brenda [name] is a 75 y.o. female who presents for Medicare Annual (Subsequent) preventive examination.  Review of Systems     Cardiac Risk Factors include: advanced age (>9men, >32 women);diabetes mellitus;dyslipidemia;hypertension;obesity (BMI >30kg/m2)     Objective:    Today's Vitals   08/23/20 1416 08/23/20 1422  BP: 110/68   Pulse: 98   Resp: 16   Temp: (!) 97.4 F (36.3 C)   TempSrc: Temporal   SpO2: 96%   Weight: 223 lb 9.6 oz (101.4 kg)   Height: 5\' 7"  (1.702 m)   PainSc:  10-Worst pain ever   Body mass index is 35.02 kg/m.  Advanced Directives 08/23/2020 05/24/2019 05/18/2018 03/29/2016 12/25/2014 05/01/2014  Does Patient Have a Medical Advance Directive? No No Yes No No No  Does patient want to make changes to medical advance directive? - - Yes (MAU/Ambulatory/Procedural Areas - Information given) - - -  Would patient like information on creating a medical advance directive? Yes (MAU/Ambulatory/Procedural Areas - Information given) No - Patient declined - Yes (MAU/Ambulatory/Procedural Areas - Information given) Yes - Educational materials given Yes - Educational materials given    Current Medications (verified) Outpatient Encounter Medications as of 08/23/2020  Medication Sig   acetaminophen (TYLENOL) 650 MG CR tablet Take 1,300 mg by mouth daily.   ALPRAZolam (XANAX) 0.25 MG tablet Take 0.5-1 tablets (0.125-0.25 mg total) by mouth 2 (two) times daily as needed for anxiety.   aspirin 81 MG tablet Take 81 mg by mouth daily.   atorvastatin (LIPITOR) 80 MG tablet TAKE 1 TABLET BY MOUTH  DAILY   benzonatate (TESSALON) 100 MG capsule Take 1 capsule (100 mg total) by mouth 3 (three) times daily as needed.   Cyanocobalamin (VITAMIN B 12 PO) Take by mouth.   cyclobenzaprine (FLEXERIL) 10 MG tablet Take 1 tablet by mouth 2 (two) times daily as needed for muscle spasms.   diclofenac (VOLTAREN) 75 MG EC tablet TAKE 1 TABLET BY MOUTH  DAILY AS NEEDED FOR   MODERATE PAIN   docusate sodium (COLACE) 100 MG capsule Take 100 mg by mouth 2 (two) times daily.   escitalopram (LEXAPRO) 20 MG tablet TAKE 2 TABLETS BY MOUTH  DAILY   famotidine (PEPCID) 40 MG tablet TAKE 1 TABLET BY MOUTH  DAILY   irbesartan (AVAPRO) 150 MG tablet TAKE 1 TABLET BY MOUTH  DAILY   KRILL OIL PO Take 1 capsule by mouth daily.   OVER THE COUNTER MEDICATION Hemorrohoid supporsitories-As directed   Probiotic Product (PROBIOTIC DAILY) CAPS Take 1 capsule by mouth daily.   psyllium (METAMUCIL) 58.6 % powder Take 1 packet by mouth 3 (three) times daily.   triamterene-hydrochlorothiazide (MAXZIDE-25) 37.5-25 MG tablet TAKE 1 TABLET BY MOUTH  DAILY   TURMERIC PO Take 1 capsule by mouth daily.   traMADol (ULTRAM) 50 MG tablet Take 50 mg by mouth every 6 (six) hours as needed. for pain (Patient not taking: Reported on 08/23/2020)   No facility-administered encounter medications on file as of 08/23/2020.    Allergies (verified) Meloxicam   History: Past Medical History:  Diagnosis Date   Depression    counseling   Diabetes mellitus type 2 in obese ([place]) 01/31/2013   Esophageal reflux 08/24/2012   GERD (gastroesophageal reflux disease)    History of chicken pox    childhood age 59   Hyperlipidemia    2011   Hypertension    Left hip pain 03/19/2016   Medicare annual wellness visit, subsequent 12/25/2014  Numerous moles 05/09/2015   Obesity 10/17/2010   Osteopenia 03/19/2016   Other and unspecified hyperlipidemia 04/26/2012   Other and unspecified hyperlipidemia 08/24/2012   Preventative health care 03/24/2016   Sacral fracture ([place]) 05/27/2016   Seasonal allergies    Tinea pedis 03/24/2016   Urinary incontinence 08/24/2012   Past Surgical History:  Procedure Laterality Date   BLADDER REPAIR     2002   TOTAL ABDOMINAL HYSTERECTOMY     2002   Family History  Problem Relation Age of Onset   Colon cancer Sister 16   Breast cancer Sister 14   Heart disease Father 61    Hypertension Father    Dementia Mother    Cancer Paternal Grandfather        bone   Obesity Daughter    Social History   Socioeconomic History   Marital status: Divorced    Spouse name: Not on file   Number of children: Not on file   Years of education: Not on file   Highest education level: Not on file  Occupational History   Not on file  Tobacco Use   Smoking status: Former    Packs/day: 0.20    Years: 15.00    Pack years: 3.00    Types: Cigarettes   Smokeless tobacco: Never   Tobacco comments:    quit 10 years ago  Vaping Use   Vaping Use: Never used  Substance and Sexual Activity   Alcohol use: No    Alcohol/week: 0.0 standard drinks   Drug use: No   Sexual activity: Never  Other Topics Concern   Not on file  Social History Narrative   Not on file   Social Determinants of Health   Financial Resource Strain: Medium Risk   Difficulty of Paying Living Expenses: Somewhat hard  Food Insecurity: Food Insecurity Present   Worried About Charity fundraiser in the Last Year: Often true   Arboriculturist in the Last Year: Never true  Transportation Needs: No Transportation Needs   Lack of Transportation (Medical): No   Lack of Transportation (Non-Medical): No  Physical Activity: Inactive   Days of Exercise per Week: 0 days   Minutes of Exercise per Session: 0 min  Stress: No Stress Concern Present   Feeling of Stress : Not at all  Social Connections: Moderately Isolated   Frequency of Communication with Friends and Family: More than three times a week   Frequency of Social Gatherings with Friends and Family: More than three times a week   Attends Religious Services: More than 4 times per year   Active Member of [employer] or Organizations: No   Attends Music therapist: Never   Marital Status: Divorced    Tobacco Counseling Counseling given: Not Answered Tobacco comments: quit 10 years ago   Clinical Intake:  Pre-visit preparation completed:  Yes  Pain : 0-10 Pain Score: 10-Worst pain ever Pain Type: Chronic pain Pain Location: Back Pain Onset: More than a month ago Pain Frequency: Constant Pain Relieving Factors: Cyclobenzaprine  Pain Relieving Factors: Cyclobenzaprine  Nutritional Status: BMI > 30  Obese Nutritional Risks: None Diabetes: Yes CBG done?: No Did pt. bring in CBG monitor from home?: No  How often do you need to have someone help you when you read instructions, pamphlets, or other written materials from your doctor or pharmacy?: 1 - Never Diabetes:  Is the patient diabetic?  Yes  If diabetic, was a CBG obtained today?  No  Did the patient bring in their glucometer from home?  No  How often do you monitor your CBG's? daily.   Financial Strains and Diabetes Management:  Are you having any financial strains with the device, your supplies or your medication? No .  Does the patient want to be seen by Chronic Care Management for management of their diabetes?  No  Would the patient like to be referred to a Nutritionist or for Diabetic Management?  No   Diabetic Exams:  Diabetic Eye Exam: Completed 04/24/2020.   Diabetic Foot Exam: Pt has been advised about the importance in completing this exam. To be completed by PCP  Interpreter Needed?: No  Information entered by :: Caroleen Hamman LPN   Activities of Daily Living In your present state of health, do you have any difficulty performing the following activities: 08/23/2020 06/05/2020  Hearing? N N  Vision? N N  Difficulty concentrating or making decisions? N N  Walking or climbing stairs? N Y  Comment - does ok but gives out of breath  Dressing or bathing? N N  Doing errands, shopping? N N  Preparing Food and eating ? N -  In the past six months, have you accidently leaked urine? Y -  Do you have problems with loss of bowel control? N -  Managing your Medications? N -  Managing your Finances? N -  Housekeeping or managing your Housekeeping? N -   Some recent data might be hidden    Patient Care Team: Mosie Lukes, MD as PCP - General (Family Medicine) Stanford Breed Denice Bors, MD as PCP - Cardiology (Cardiology)  Indicate any recent Medical Services you may have received from other than [facility] providers in the past year (date may be approximate).     Assessment:   This is a routine wellness examination for Mame.  Hearing/Vision screen Hearing Screening - Comments:: No issues Vision Screening - Comments:: Reading glasses Last eye exam-04/2020  Dietary issues and exercise activities discussed: Current Exercise Habits: The patient does not participate in regular exercise at present, Exercise limited by: None identified   Goals Addressed             This Visit's Progress    Increase physical activity   Not on track      Depression Screen PHQ 2/9 Scores 08/23/2020 06/05/2020 05/24/2019 05/18/2018 08/29/2017 03/29/2016 03/19/2016  PHQ - 2 Score 0 0 0 0 0 0 0    Fall Risk Fall Risk  08/23/2020 06/05/2020 05/24/2019 05/18/2018 08/29/2017  Falls in the past year? 0 0 0 0 No  Comment - - - - -  Number falls in past yr: 0 - 0 - -  Injury with Fall? 0 - 0 - -  Comment - - - - -  Follow up Falls prevention discussed - Education provided;Falls prevention discussed - -    FALL RISK PREVENTION PERTAINING TO THE HOME:  Any stairs in or around the home? Yes  If so, are there any without handrails? No  Home free of loose throw rugs in walkways, pet beds, electrical cords, etc? Yes  Adequate lighting in your home to reduce risk of falls? Yes   ASSISTIVE DEVICES UTILIZED TO PREVENT FALLS:  Life alert? No  Use of a cane, walker or w/c? No  Grab bars in the bathroom? No  Shower chair or bench in shower? No  Elevated toilet seat or a handicapped toilet? No   TIMED UP AND GO:  Was the test performed? Yes .  Length of time to ambulate 10 feet: 10 sec.   Gait steady and fast without use of assistive device  Cognitive Function:Normal  cognitive status assessed by direct observation by this Nurse Health Advisor. No abnormalities found.   MMSE - Mini Mental State Exam 03/29/2016  Orientation to time 5  Orientation to Place 5  Registration 3  Attention/ Calculation 5  Recall 3  Language- name 2 objects 2  Language- repeat 1  Language- follow 3 step command 3  Language- read & follow direction 1  Write a sentence 1  Copy design 1  Total score 30        Immunizations Immunization History  Administered Date(s) Administered   Influenza Split 01/01/2010, 01/16/2011, 11/25/2011, 01/03/2014   Influenza Whole 12/31/2012   Influenza, High Dose Seasonal PF 11/15/2016, 10/26/2018   Influenza,inj,Quad PF,6+ Mos 12/20/2013, 12/19/2014   Influenza-Unspecified 11/09/2015, 11/25/2017, 11/17/2019   PFIZER(Purple Top)SARS-COV-2 Vaccination 04/08/2019, 04/29/2019, 12/12/2019, 07/07/2020   Pneumococcal Conjugate-13 01/25/2013   Pneumococcal Polysaccharide-23 12/21/2007, 12/19/2014   Tdap 04/17/2011    TDAP status: Up to date  Flu Vaccine status: Up to date  Pneumococcal vaccine status: Up to date  Covid-19 vaccine status: Completed vaccines  Qualifies for Shingles Vaccine? Yes   Zostavax completed No   Shingrix Completed?: No.    Education has been provided regarding the importance of this vaccine. Patient has been advised to call insurance company to determine out of pocket expense if they have not yet received this vaccine. Advised may also receive vaccine at local pharmacy or Health Dept. Verbalized acceptance and understanding.  Screening Tests Health Maintenance  Topic Date Due   Zoster Vaccines- Shingrix (1 of 2) Never done   FOOT EXAM  03/26/2016   MAMMOGRAM  09/16/2020   INFLUENZA VACCINE  10/02/2020   COVID-19 Vaccine (5 - Booster for Pfizer series) 11/07/2020   HEMOGLOBIN A1C  12/05/2020   TETANUS/TDAP  04/16/2021   COLONOSCOPY (Pts 45-44yrs Insurance coverage will need to be confirmed)  04/22/2021    OPHTHALMOLOGY EXAM  04/24/2021   DEXA SCAN  Completed   Hepatitis C Screening  Completed   PNA vac Low Risk Adult  Completed   HPV VACCINES  Aged Out    Health Maintenance  Health Maintenance Due  Topic Date Due   Zoster Vaccines- Shingrix (1 of 2) Never done   FOOT EXAM  03/26/2016    Colorectal cancer screening: Type of screening: Colonoscopy. Completed 04/22/2016. Repeat every 5 years  Mammogram status: Completed Bilateral 09/17/2019. Repeat every year  Bone Density status: Ordered today. Pt provided with contact info and advised to call to schedule appt.  Lung Cancer Screening: (Low Dose CT Chest recommended if Age 33-80 years, 30 pack-year currently smoking OR have quit w/in 15years.) does not qualify.     Additional Screening:  Hepatitis C Screening: Completed 12/19/2014  Vision Screening: Recommended annual ophthalmology exams for early detection of glaucoma and other disorders of the eye. Is the patient up to date with their annual eye exam?  Yes  Who is the provider or what is the name of the office in which the patient attends annual eye exams? Dr. Trinna Post  Dental Screening: Recommended annual dental exams for proper oral hygiene  Community Resource Referral / Chronic Care Management: CRR required this visit?  Yes For food assistance  CCM required this visit?  No      Plan:     I have personally reviewed and noted the following in the patient's chart:  Medical and social history Use of alcohol, tobacco or illicit drugs  Current medications and supplements including opioid prescriptions.  Functional ability and status Nutritional status Physical activity Advanced directives List of other physicians Hospitalizations, surgeries, and ER visits in previous 12 months Vitals Screenings to include cognitive, depression, and falls Referrals and appointments  In addition, I have reviewed and discussed with patient certain preventive protocols, quality  metrics, and best practice recommendations. A written personalized care plan for preventive services as well as general preventive health recommendations were provided to patient.     Marta Antu, LPN   4/38/3818  Nurse Health Advisor  Nurse Notes: None

## 2020-08-25 ENCOUNTER — Telehealth: Payer: Self-pay

## 2020-08-25 NOTE — Telephone Encounter (Signed)
   Telephone encounter was:  Successful.  08/25/2020 Name: Brenda [name] MRN: [id number] DOB: [birth year]  Brenda [name] is a 75 y.o. year old female who is a primary care patient of Mosie Lukes, MD . The community resource team was consulted for assistance with [name] guide performed the following interventions: Spoke with patient confirmed email address [email or web address]. Research scientist (life sciences) and mobile meals of [employer]..  Follow Up Plan:  Care guide will follow up with patient by phone over the next 7 days.  Elissia Spiewak, AAS Paralegal, [place] [facility]  [address], [place] [address] ??[email or web address]  ?? [phone]   [email or web address]

## 2020-08-29 ENCOUNTER — Telehealth: Payer: Self-pay

## 2020-08-29 NOTE — Telephone Encounter (Signed)
   Telephone encounter was:  Unsuccessful.  08/29/2020 Name: Brenda [name] MRN: [id number] DOB: 02-10-1946  Unsuccessful outbound call made today to assist with:  Left message on home and cell voicemail to return my call regarding resources emailed 6/24 for local food pantries.  Outreach Attempt:  2nd Attempt  A HIPAA compliant voice message was left requesting a return call.  Instructed patient to call back at [phone].  Kemi Gell, AAS Paralegal, [place] [facility]  [address], [place] [address] ??[email or web address]  ?? [phone]   [email or web address]

## 2020-08-30 ENCOUNTER — Telehealth: Payer: Self-pay

## 2020-08-30 NOTE — Telephone Encounter (Signed)
   Telephone encounter was:  Unsuccessful.  08/30/2020 Name: Brenda [name] MRN: [id number] DOB: [birth year]  Unsuccessful outbound call made today to assist with:   Left message on home and cell voicemail to return my call regarding resources emailed 6/24 for local food pantries.  Outreach Attempt:  3rd Attempt.  Referral closed unable to contact patient.  A HIPAA compliant voice message was left requesting a return call.  Instructed patient to call back at [phone].  Marri Mcneff, AAS Paralegal, [place] [facility]  [address], [place] [address] ??[email or web address]  ?? [phone]   [email or web address]

## 2020-09-13 ENCOUNTER — Other Ambulatory Visit: Payer: Self-pay

## 2020-09-13 ENCOUNTER — Telehealth: Payer: Self-pay | Admitting: Family Medicine

## 2020-09-13 DIAGNOSIS — N8110 Cystocele, unspecified: Secondary | ICD-10-CM

## 2020-09-13 NOTE — Telephone Encounter (Signed)
Referral in

## 2020-09-13 NOTE — Telephone Encounter (Signed)
The patient needs the urology referral to be re-sent. patient waiting too long to schedule an appt. Please notify the patient when you are finished.

## 2020-09-13 NOTE — Progress Notes (Signed)
Amb

## 2020-09-18 ENCOUNTER — Ambulatory Visit (HOSPITAL_BASED_OUTPATIENT_CLINIC_OR_DEPARTMENT_OTHER): Payer: Medicare Other

## 2020-10-02 ENCOUNTER — Encounter (HOSPITAL_BASED_OUTPATIENT_CLINIC_OR_DEPARTMENT_OTHER): Payer: Self-pay

## 2020-10-02 ENCOUNTER — Ambulatory Visit (HOSPITAL_BASED_OUTPATIENT_CLINIC_OR_DEPARTMENT_OTHER)
Admission: RE | Admit: 2020-10-02 | Discharge: 2020-10-02 | Disposition: A | Discharge status: Home / Self Care | Payer: Medicare Other | Source: Ambulatory Visit | Attending: Family Medicine | Admitting: Family Medicine

## 2020-10-02 ENCOUNTER — Other Ambulatory Visit: Payer: Self-pay

## 2020-10-02 DIAGNOSIS — Z1231 Encounter for screening mammogram for malignant neoplasm of breast: Secondary | ICD-10-CM

## 2020-10-02 DIAGNOSIS — M85851 Other specified disorders of bone density and structure, right thigh: Secondary | ICD-10-CM | POA: Diagnosis not present

## 2020-10-02 DIAGNOSIS — Z780 Asymptomatic menopausal state: Secondary | ICD-10-CM | POA: Insufficient documentation

## 2020-10-05 ENCOUNTER — Other Ambulatory Visit: Payer: Self-pay | Admitting: Family Medicine

## 2020-10-05 DIAGNOSIS — R928 Other abnormal and inconclusive findings on diagnostic imaging of breast: Secondary | ICD-10-CM

## 2020-10-18 ENCOUNTER — Other Ambulatory Visit: Payer: Self-pay | Admitting: Family Medicine

## 2020-10-20 ENCOUNTER — Other Ambulatory Visit: Payer: Self-pay

## 2020-10-20 ENCOUNTER — Ambulatory Visit
Admission: RE | Admit: 2020-10-20 | Discharge: 2020-10-20 | Disposition: A | Discharge status: Home / Self Care | Payer: Medicare Other | Source: Ambulatory Visit | Attending: Family Medicine | Admitting: Family Medicine

## 2020-10-20 ENCOUNTER — Other Ambulatory Visit: Payer: Self-pay | Admitting: Family Medicine

## 2020-10-20 DIAGNOSIS — R928 Other abnormal and inconclusive findings on diagnostic imaging of breast: Secondary | ICD-10-CM

## 2020-10-20 DIAGNOSIS — R921 Mammographic calcification found on diagnostic imaging of breast: Secondary | ICD-10-CM | POA: Diagnosis not present

## 2020-10-20 DIAGNOSIS — R922 Inconclusive mammogram: Secondary | ICD-10-CM | POA: Diagnosis not present

## 2020-11-10 DIAGNOSIS — E1136 Type 2 diabetes mellitus with diabetic cataract: Secondary | ICD-10-CM | POA: Diagnosis not present

## 2020-11-10 DIAGNOSIS — H119 Unspecified disorder of conjunctiva: Secondary | ICD-10-CM | POA: Diagnosis not present

## 2020-11-10 DIAGNOSIS — E119 Type 2 diabetes mellitus without complications: Secondary | ICD-10-CM | POA: Diagnosis not present

## 2020-11-10 DIAGNOSIS — H25813 Combined forms of age-related cataract, bilateral: Secondary | ICD-10-CM | POA: Diagnosis not present

## 2020-11-10 DIAGNOSIS — H2513 Age-related nuclear cataract, bilateral: Secondary | ICD-10-CM | POA: Diagnosis not present

## 2020-11-22 ENCOUNTER — Other Ambulatory Visit: Payer: Self-pay | Admitting: Family Medicine

## 2020-11-22 IMAGING — MG DIGITAL SCREENING BILATERAL MAMMOGRAM WITH TOMO AND CAD
6 of 10 series · 6 of 30 positions shown · non-contrast
Comparison: Previous exam(s).

CLINICAL DATA: Screening.

EXAM:
DIGITAL SCREENING BILATERAL MAMMOGRAM WITH TOMO AND CAD

[R MLO synth-2D]
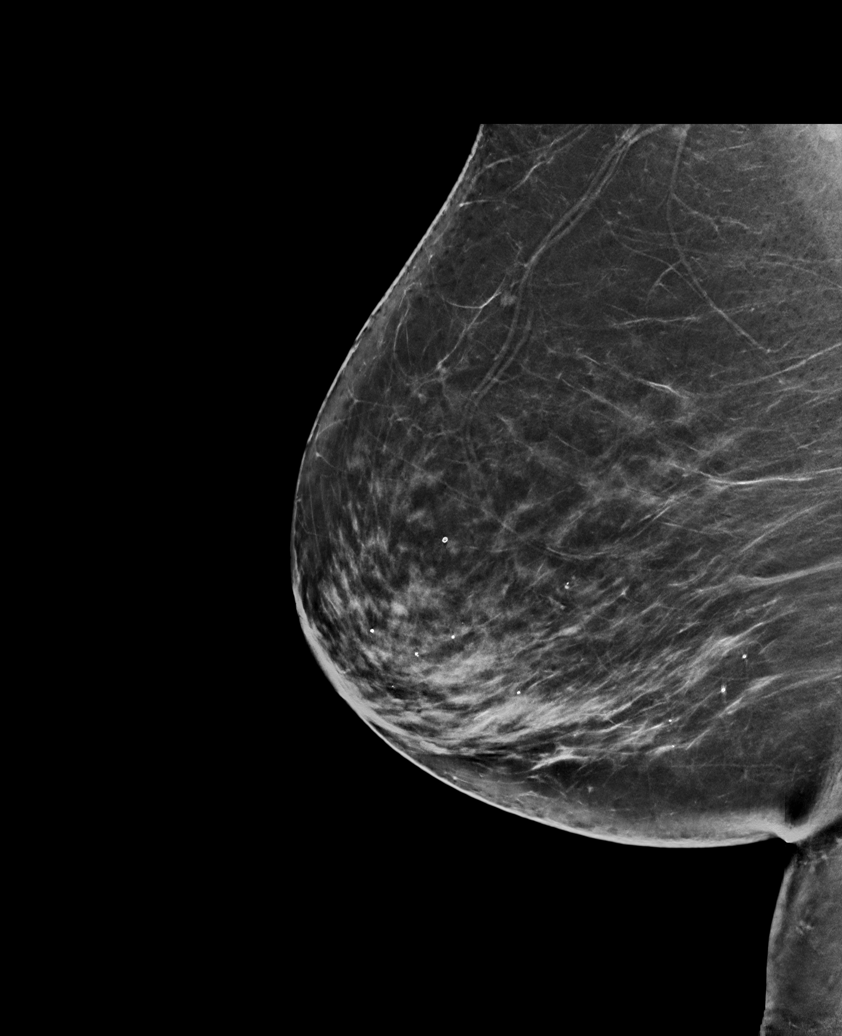

[L MLO synth-2D]
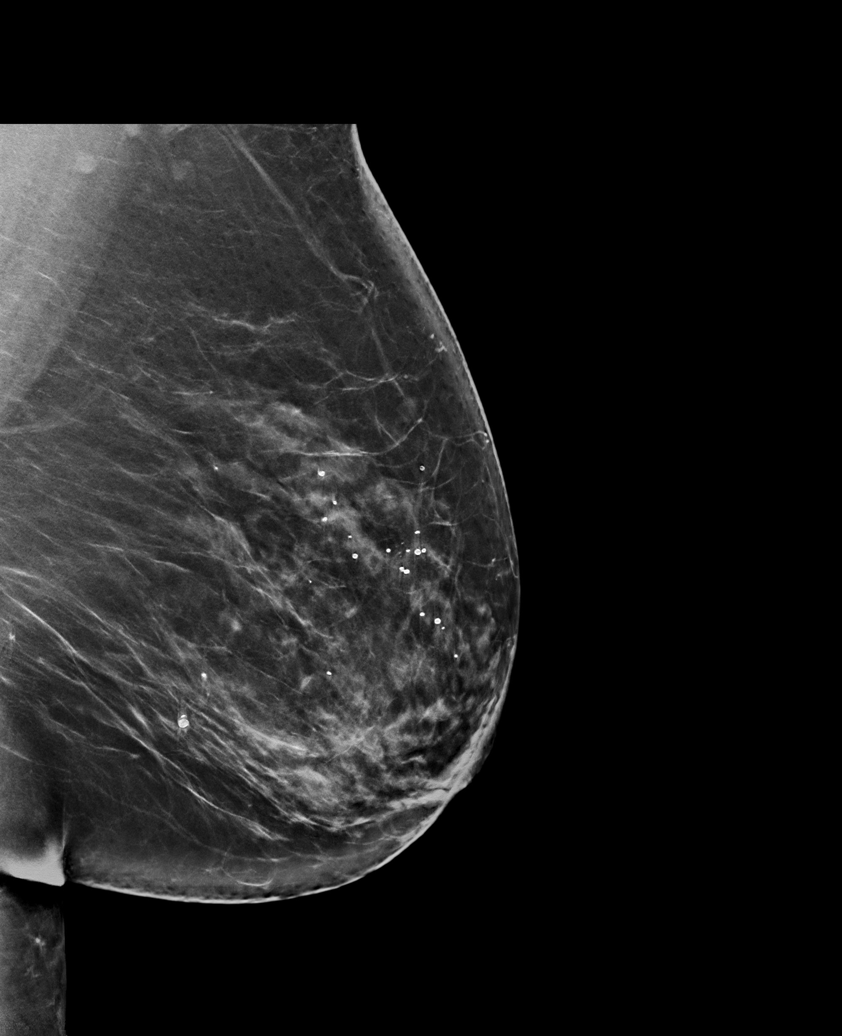

[L CC synth-2D]
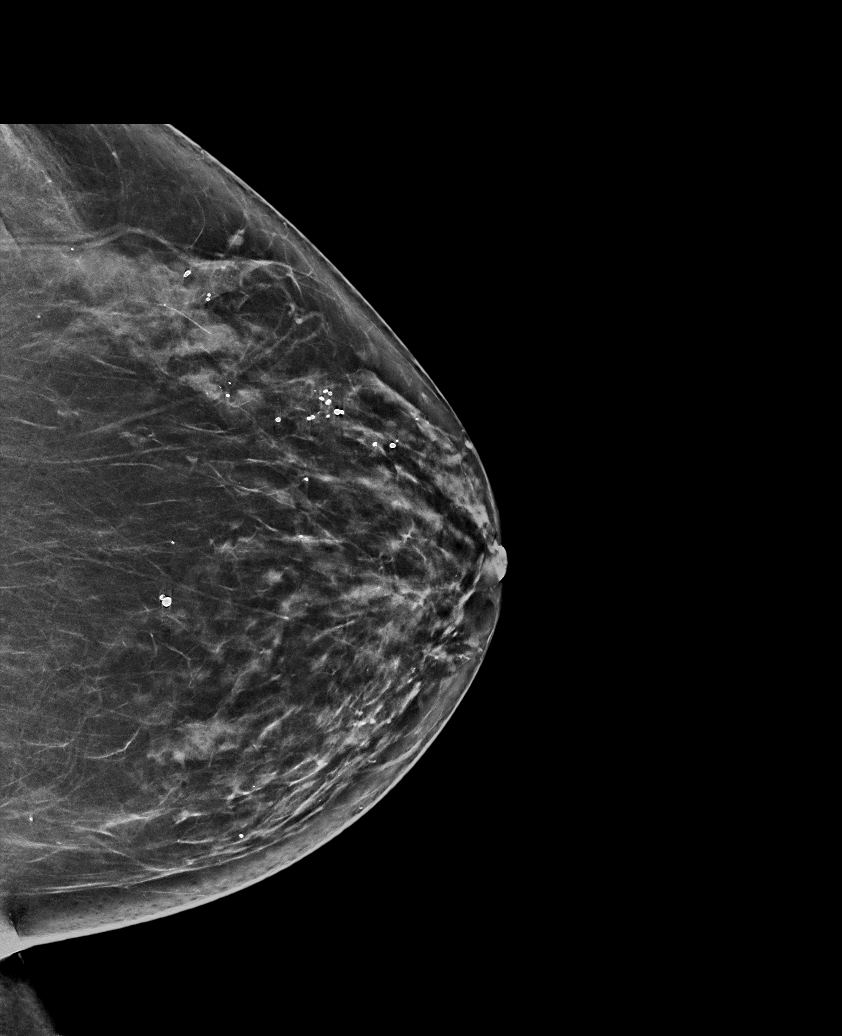

[L XCCL synth-2D]
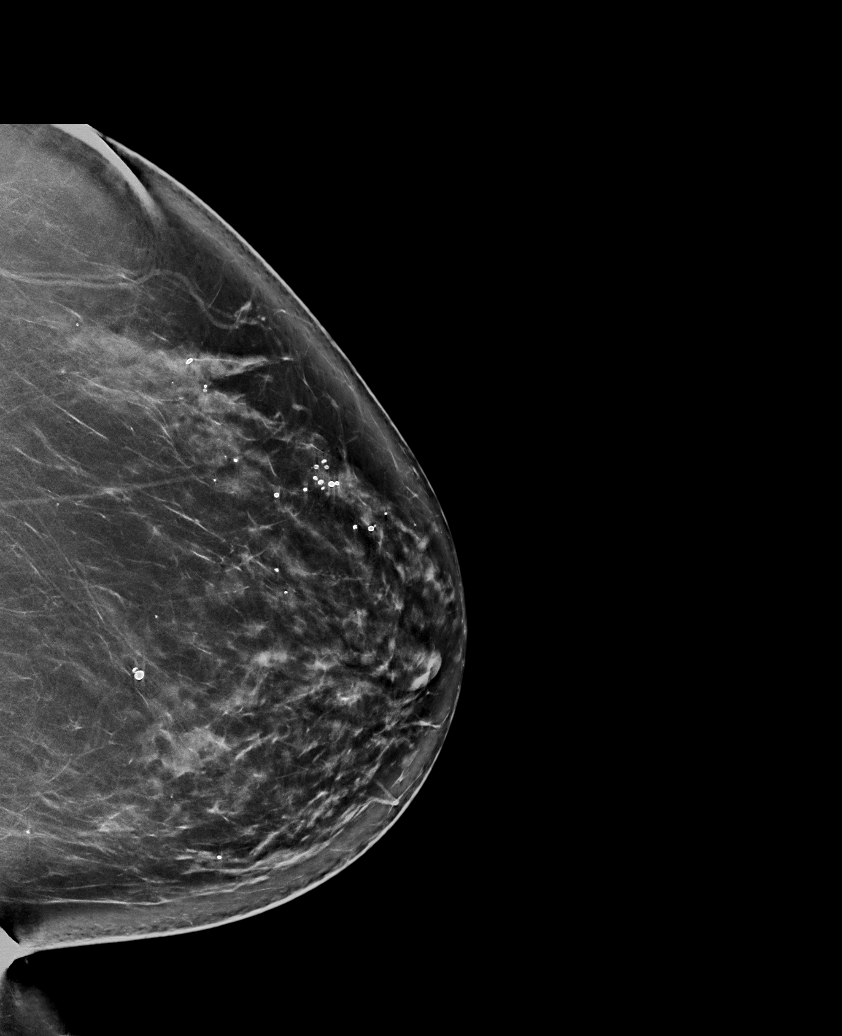

[R CC synth-2D]
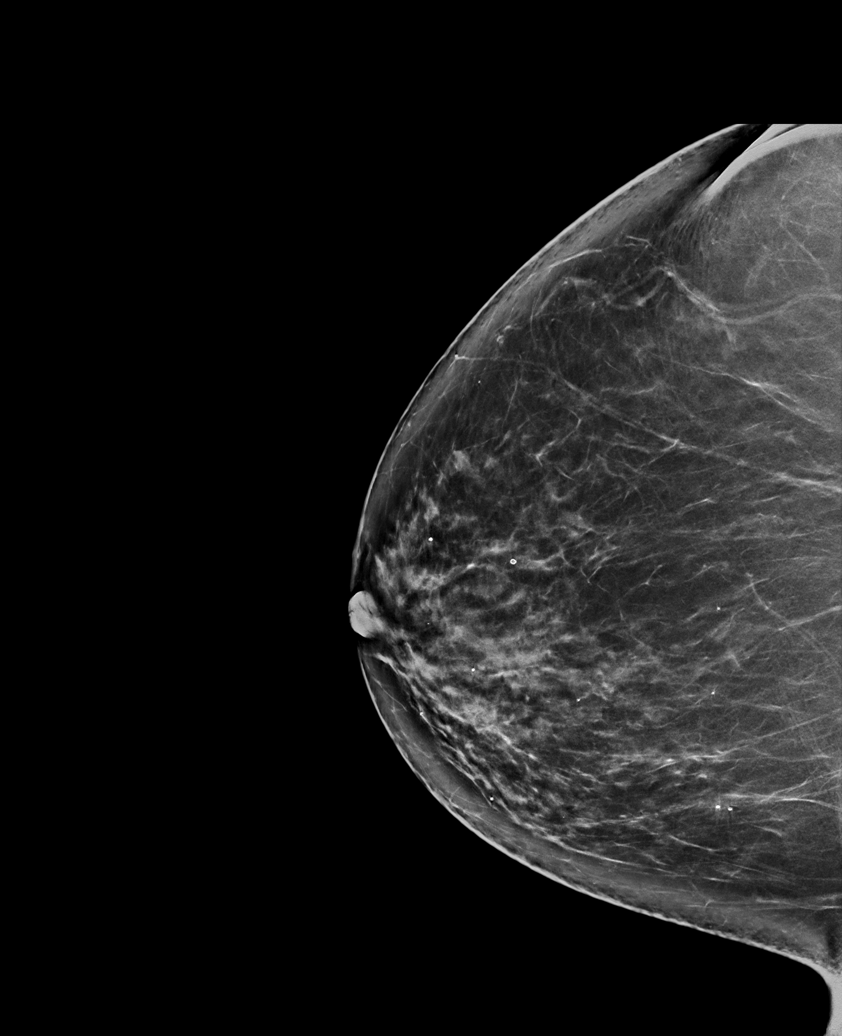

[R CC tomo · tomo slice 41/81.0]
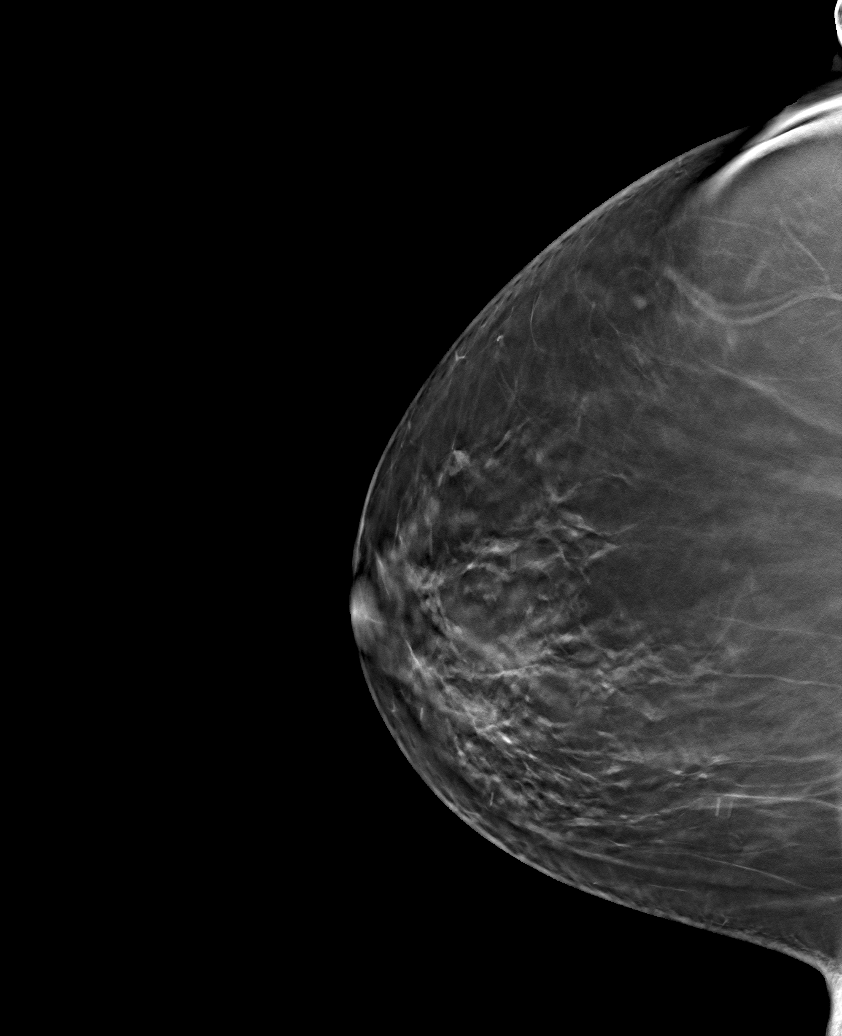

[6 of 30 positions shown; findings below may reference images not displayed]

ACR Breast Density Category b: There are scattered areas of
fibroglandular density.
FINDINGS: There are no findings suspicious for malignancy. Images were
processed with CAD.
IMPRESSION: No mammographic evidence of malignancy. A result letter of this
screening mammogram will be mailed directly to the patient.

RECOMMENDATION:
Screening mammogram in one year. (Code:[id number])

BI-RADS CATEGORY  1: Negative.

## 2021-01-29 ENCOUNTER — Other Ambulatory Visit: Payer: Self-pay

## 2021-01-30 ENCOUNTER — Ambulatory Visit (INDEPENDENT_AMBULATORY_CARE_PROVIDER_SITE_OTHER): Payer: Medicare Other | Admitting: Family Medicine

## 2021-01-30 ENCOUNTER — Encounter: Payer: Self-pay | Admitting: Family Medicine

## 2021-01-30 ENCOUNTER — Other Ambulatory Visit: Payer: Self-pay

## 2021-01-30 VITALS — BP 112/74 | HR 105 | Temp 98.3°F | Resp 16 | Ht 67.0 in | Wt 217.6 lb

## 2021-01-30 DIAGNOSIS — E1169 Type 2 diabetes mellitus with other specified complication: Secondary | ICD-10-CM

## 2021-01-30 DIAGNOSIS — Z0000 Encounter for general adult medical examination without abnormal findings: Secondary | ICD-10-CM

## 2021-01-30 DIAGNOSIS — E782 Mixed hyperlipidemia: Secondary | ICD-10-CM

## 2021-01-30 DIAGNOSIS — I517 Cardiomegaly: Secondary | ICD-10-CM

## 2021-01-30 DIAGNOSIS — R32 Unspecified urinary incontinence: Secondary | ICD-10-CM

## 2021-01-30 DIAGNOSIS — E669 Obesity, unspecified: Secondary | ICD-10-CM | POA: Diagnosis not present

## 2021-01-30 DIAGNOSIS — M8580 Other specified disorders of bone density and structure, unspecified site: Secondary | ICD-10-CM | POA: Diagnosis not present

## 2021-01-30 DIAGNOSIS — R0609 Other forms of dyspnea: Secondary | ICD-10-CM | POA: Diagnosis not present

## 2021-01-30 DIAGNOSIS — Z86010 Personal history of colonic polyps: Secondary | ICD-10-CM | POA: Diagnosis not present

## 2021-01-30 DIAGNOSIS — I10 Essential (primary) hypertension: Secondary | ICD-10-CM | POA: Diagnosis not present

## 2021-01-30 DIAGNOSIS — F4321 Adjustment disorder with depressed mood: Secondary | ICD-10-CM

## 2021-01-30 HISTORY — DX: Cardiomegaly: I51.7

## 2021-01-30 MED ORDER — TIRZEPATIDE 2.5 MG/0.5ML ~~LOC~~ SOAJ: 2.5000 mg | SUBCUTANEOUS | 0 refills | Status: DC

## 2021-01-30 NOTE — Assessment & Plan Note (Signed)
Tolerating statin, encouraged heart healthy diet, avoid trans fats, minimize simple carbs and saturated fats. Increase exercise as tolerated 

## 2021-01-30 NOTE — Assessment & Plan Note (Signed)
Moderate on last check but noting some dyspnea. Repeat echo

## 2021-01-30 NOTE — Assessment & Plan Note (Addendum)
Encouraged DASH or MIND diet, decrease po intake and increase exercise as tolerated. Needs 7-8 hours of sleep nightly. Avoid trans fats, eat small, frequent meals every 4-5 hours with lean proteins, complex carbs and healthy fats. Minimize simple carbs, high fat foods and processed foods. Agrees to try [facility] prescription.

## 2021-01-30 NOTE — Assessment & Plan Note (Signed)
Her daughter died in 2020. She has had good support and she is supporting her grandson.  She feels she can look forward now.

## 2021-01-30 NOTE — Assessment & Plan Note (Signed)
Encouraged to get adequate exercise, calcium and vitamin d intake 

## 2021-01-30 NOTE — Assessment & Plan Note (Signed)
Sees urology on 12/9

## 2021-01-30 NOTE — Assessment & Plan Note (Signed)
Well controlled, no changes to meds. Encouraged heart healthy diet such as the DASH diet and exercise as tolerated.  °

## 2021-01-30 NOTE — Assessment & Plan Note (Signed)
hgba1c acceptable, minimize simple carbs. Increase exercise as tolerated. Continue current meds 

## 2021-01-30 NOTE — Assessment & Plan Note (Signed)
Patient encouraged to maintain heart healthy diet, regular exercise, adequate sleep. Consider daily probiotics. Take medications as prescribed. Is switching to an AARP Medicare Advantage Plan next year to get her Shingrix shots paid for. Labs ordered and reviewed. Colonoscopy in 2018 at [facility] repeat in 2023. MGM done in 2022 repeat next year. Has aged out of Paps and her last pap was normal 

## 2021-01-30 NOTE — Patient Instructions (Signed)
Preventive Care 40 Years and Older, Female Preventive care refers to lifestyle choices and visits with your health care provider that can promote health and wellness. Preventive care visits are also called wellness exams. What can I expect for my preventive care visit? Counseling Your health care provider may ask you questions about your: Medical history, including: Past medical problems. Family medical history. Pregnancy and menstrual history. History of falls. Current health, including: Memory and ability to understand (cognition). Emotional well-being. Home life and relationship well-being. Sexual activity and sexual health. Lifestyle, including: Alcohol, nicotine or tobacco, and drug use. Access to firearms. Diet, exercise, and sleep habits. Work and work Statistician. Sunscreen use. Safety issues such as seatbelt and bike helmet use. Physical exam Your health care provider will check your: Height and weight. These may be used to calculate your BMI (body mass index). BMI is a measurement that tells if you are at a healthy weight. Waist circumference. This measures the distance around your waistline. This measurement also tells if you are at a healthy weight and may help predict your risk of certain diseases, such as type 2 diabetes and high blood pressure. Heart rate and blood pressure. Body temperature. Skin for abnormal spots. What immunizations do I need? Vaccines are usually given at various ages, according to a schedule. Your health care provider will recommend vaccines for you based on your age, medical history, and lifestyle or other factors, such as travel or where you work. What tests do I need? Screening Your health care provider may recommend screening tests for certain conditions. This may include: Lipid and cholesterol levels. Hepatitis C test. Hepatitis B test. HIV (human immunodeficiency virus) test. STI (sexually transmitted infection) testing, if you are at  risk. Lung cancer screening. Colorectal cancer screening. Diabetes screening. This is done by checking your blood sugar (glucose) after you have not eaten for a while (fasting). Mammogram. Talk with your health care provider about how often you should have regular mammograms. BRCA-related cancer screening. This may be done if you have a family history of breast, ovarian, tubal, or peritoneal cancers. Bone density scan. This is done to screen for osteoporosis. Talk with your health care provider about your test results, treatment options, and if necessary, the need for more tests. Follow these instructions at home: Eating and drinking  Eat a diet that includes fresh fruits and vegetables, whole grains, lean protein, and low-fat dairy products. Limit your intake of foods with high amounts of sugar, saturated fats, and salt. Take vitamin and mineral supplements as recommended by your health care provider. Do not drink alcohol if your health care provider tells you not to drink. If you drink alcohol: Limit how much you have to 0-1 drink a day. Know how much alcohol is in your drink. In the [place] one drink equals one 12 oz bottle of beer (355 mL), one 5 oz glass of wine (148 mL), or one 1 oz glass of hard liquor (44 mL). Lifestyle Brush your teeth every morning and night with fluoride toothpaste. Floss one time each day. Exercise for at least 30 minutes 5 or more days each week. Do not use any products that contain nicotine or tobacco. These products include cigarettes, chewing tobacco, and vaping devices, such as e-cigarettes. If you need help quitting, ask your health care provider. Do not use drugs. If you are sexually active, practice safe sex. Use a condom or other form of protection in order to prevent STIs. Take aspirin only as told by your  health care provider. Make sure that you understand how much to take and what form to take. Work with your health care provider to find out whether it  is safe and beneficial for you to take aspirin daily. Ask your health care provider if you need to take a cholesterol-lowering medicine (statin). Find healthy ways to manage stress, such as: Meditation, yoga, or listening to music. Journaling. Talking to a trusted person. Spending time with friends and family. Minimize exposure to UV radiation to reduce your risk of skin cancer. Safety Always wear your seat belt while driving or riding in a vehicle. Do not drive: If you have been drinking alcohol. Do not ride with someone who has been drinking. When you are tired or distracted. While texting. If you have been using any mind-altering substances or drugs. Wear a helmet and other protective equipment during sports activities. If you have firearms in your house, make sure you follow all gun safety procedures. What's next? Visit your health care provider once a year for an annual wellness visit. Ask your health care provider how often you should have your eyes and teeth checked. Stay up to date on all vaccines. This information is not intended to replace advice given to you by your health care provider. Make sure you discuss any questions you have with your health care provider. Document Revised: 08/16/2020 Document Reviewed: 08/16/2020 Elsevier Patient Education  [place].

## 2021-01-31 DIAGNOSIS — Z860100 Personal history of colon polyps, unspecified: Secondary | ICD-10-CM | POA: Insufficient documentation

## 2021-01-31 HISTORY — DX: Personal history of colonic polyps: Z86.010

## 2021-01-31 HISTORY — DX: Personal history of colon polyps, unspecified: Z86.0100

## 2021-01-31 LAB — CBC
HCT: 41.3 % (ref 36.0–46.0)
Hemoglobin: 13.5 g/dL (ref 12.0–15.0)
MCHC: 32.7 g/dL (ref 30.0–36.0)
MCV: 91.9 fl (ref 78.0–100.0)
Platelets: 380 10*3/uL (ref 150.0–400.0)
RBC: 4.49 Mil/uL (ref 3.87–5.11)
RDW: 14.7 % (ref 11.5–15.5)
WBC: 6 10*3/uL (ref 4.0–10.5)

## 2021-01-31 LAB — COMPREHENSIVE METABOLIC PANEL
ALT: 13 U/L (ref 0–35)
AST: 15 U/L (ref 0–37)
Albumin: 4.2 g/dL (ref 3.5–5.2)
Alkaline Phosphatase: 72 U/L (ref 39–117)
BUN: 29 mg/dL — ABNORMAL HIGH (ref 6–23)
CO2: 25 mEq/L (ref 19–32)
Calcium: 9.8 mg/dL (ref 8.4–10.5)
Chloride: 102 mEq/L (ref 96–112)
Creatinine, Ser: 1.1 mg/dL (ref 0.40–1.20)
GFR: 49.23 mL/min — ABNORMAL LOW (ref >60.00)
Glucose, Bld: 138 mg/dL — ABNORMAL HIGH (ref 70–99)
Potassium: 3.9 mEq/L (ref 3.5–5.1)
Sodium: 140 mEq/L (ref 135–145)
Total Bilirubin: 0.4 mg/dL (ref 0.2–1.2)
Total Protein: 7.2 g/dL (ref 6.0–8.3)

## 2021-01-31 LAB — TSH: TSH: 1.49 u[IU]/mL (ref 0.35–5.50)

## 2021-01-31 LAB — LIPID PANEL
Cholesterol: 165 mg/dL (ref 0–200)
HDL: 44.6 mg/dL (ref >39.00)
NonHDL: 120.31
Total CHOL/HDL Ratio: 4
Triglycerides: 208 mg/dL — ABNORMAL HIGH (ref 0.0–149.0)
VLDL: 41.6 mg/dL — ABNORMAL HIGH (ref 0.0–40.0)

## 2021-01-31 LAB — HEMOGLOBIN A1C: Hgb A1c MFr Bld: 7.4 % — ABNORMAL HIGH (ref 4.6–6.5)

## 2021-01-31 LAB — LDL CHOLESTEROL, DIRECT: Direct LDL: 100 mg/dL

## 2021-01-31 NOTE — Progress Notes (Signed)
Subjective:    Patient ID: Brenda [name], female    DOB: 06-10-1945, 75 y.o.   MRN: [id number]  Chief Complaint  Patient presents with   Annual Exam    HPI Patient is in today for annual preventative exam and follow up on chronic medical concerns. She feels well today. No recent febrile illness or hospitalizations. She is try ing to eat well and stay active but acknowledges she is not consistent. No c/o polyuria or polydipsia. Denies CP/palp/SOB/HA/congestion/fevers/GI or GU c/o. Taking meds as prescribed   Past Medical History:  Diagnosis Date   Depression    counseling   Diabetes mellitus type 2 in obese ([place]) 01/31/2013   Esophageal reflux 08/24/2012   GERD (gastroesophageal reflux disease)    History of chicken pox    childhood age 36   Hyperlipidemia    2011   Hypertension    Left hip pain 03/19/2016   Medicare annual wellness visit, subsequent 12/25/2014   Numerous moles 05/09/2015   Obesity 10/17/2010   Osteopenia 03/19/2016   Other and unspecified hyperlipidemia 04/26/2012   Other and unspecified hyperlipidemia 08/24/2012   Preventative health care 03/24/2016   Sacral fracture ([place]) 05/27/2016   Seasonal allergies    Tinea pedis 03/24/2016   Urinary incontinence 08/24/2012    Past Surgical History:  Procedure Laterality Date   BLADDER REPAIR     2002   TOTAL ABDOMINAL HYSTERECTOMY     2002    Family History  Problem Relation Age of Onset   Colon cancer Sister 66   Breast cancer Sister 24   Heart disease Father 57   Hypertension Father    Dementia Mother    Cancer Paternal Grandfather        bone   Obesity Daughter     Social History   Socioeconomic History   Marital status: Divorced    Spouse name: Not on file   Number of children: Not on file   Years of education: Not on file   Highest education level: Not on file  Occupational History   Not on file  Tobacco Use   Smoking status: Former    Packs/day: 0.20    Years: 15.00    Pack years: 3.00     Types: Cigarettes   Smokeless tobacco: Never   Tobacco comments:    quit 10 years ago  Vaping Use   Vaping Use: Never used  Substance and Sexual Activity   Alcohol use: No    Alcohol/week: 0.0 standard drinks   Drug use: No   Sexual activity: Never  Other Topics Concern   Not on file  Social History Narrative   Not on file   Social Determinants of Health   Financial Resource Strain: Medium Risk   Difficulty of Paying Living Expenses: Somewhat hard  Food Insecurity: Food Insecurity Present   Worried About Charity fundraiser in the Last Year: Often true   Arboriculturist in the Last Year: Never true  Transportation Needs: No Transportation Needs   Lack of Transportation (Medical): No   Lack of Transportation (Non-Medical): No  Physical Activity: Inactive   Days of Exercise per Week: 0 days   Minutes of Exercise per Session: 0 min  Stress: No Stress Concern Present   Feeling of Stress : Not at all  Social Connections: Moderately Isolated   Frequency of Communication with Friends and Family: More than three times a week   Frequency of Social Gatherings with Friends and  Family: More than three times a week   Attends Religious Services: More than 4 times per year   Active Member of Clubs or Organizations: No   Attends Archivist Meetings: Never   Marital Status: Divorced  Human resources officer Violence: Not At Risk   Fear of Current or Ex-Partner: No   Emotionally Abused: No   Physically Abused: No   Sexually Abused: No    Outpatient Medications Prior to Visit  Medication Sig Dispense Refill   acetaminophen (TYLENOL) 650 MG CR tablet Take 1,300 mg by mouth daily.     ALPRAZolam (XANAX) 0.25 MG tablet Take 0.5-1 tablets (0.125-0.25 mg total) by mouth 2 (two) times daily as needed for anxiety. 20 tablet 1   aspirin 81 MG tablet Take 81 mg by mouth daily.     atorvastatin (LIPITOR) 80 MG tablet TAKE 1 TABLET BY MOUTH  DAILY 90 tablet 3   Cyanocobalamin (VITAMIN B 12  PO) Take by mouth.     cyclobenzaprine (FLEXERIL) 10 MG tablet Take 1 tablet by mouth 2 (two) times daily as needed for muscle spasms.     diclofenac (VOLTAREN) 75 MG EC tablet TAKE 1 TABLET BY MOUTH  DAILY AS NEEDED FOR  MODERATE PAIN 90 tablet 3   docusate sodium (COLACE) 100 MG capsule Take 100 mg by mouth 2 (two) times daily.     escitalopram (LEXAPRO) 20 MG tablet TAKE 2 TABLETS BY MOUTH  DAILY 180 tablet 1   famotidine (PEPCID) 40 MG tablet TAKE 1 TABLET BY MOUTH  DAILY 90 tablet 1   irbesartan (AVAPRO) 150 MG tablet TAKE 1 TABLET BY MOUTH  DAILY 90 tablet 1   KRILL OIL PO Take 1 capsule by mouth daily.     OVER THE COUNTER MEDICATION Hemorrohoid supporsitories-As directed     Probiotic Product (PROBIOTIC DAILY) CAPS Take 1 capsule by mouth daily.     psyllium (METAMUCIL) 58.6 % powder Take 1 packet by mouth 3 (three) times daily.     triamterene-hydrochlorothiazide (MAXZIDE-25) 37.5-25 MG tablet TAKE 1 TABLET BY MOUTH  DAILY 90 tablet 1   TURMERIC PO Take 1 capsule by mouth daily.     benzonatate (TESSALON) 100 MG capsule Take 1 capsule (100 mg total) by mouth 3 (three) times daily as needed. 20 capsule 0   traMADol (ULTRAM) 50 MG tablet Take 50 mg by mouth every 6 (six) hours as needed. for pain (Patient not taking: Reported on 08/23/2020)     No facility-administered medications prior to visit.    Allergies  Allergen Reactions   Meloxicam Nausea Only and Nausea And Vomiting    Review of Systems  Constitutional:  Positive for malaise/fatigue. Negative for chills and fever.  HENT:  Negative for congestion and hearing loss.   Eyes:  Negative for discharge.  Respiratory:  Negative for cough, sputum production and shortness of breath.   Cardiovascular:  Negative for chest pain, palpitations and leg swelling.  Gastrointestinal:  Negative for abdominal pain, blood in stool, constipation, diarrhea, heartburn, nausea and vomiting.  Genitourinary:  Negative for dysuria, frequency,  hematuria and urgency.  Musculoskeletal:  Negative for back pain, falls and myalgias.  Skin:  Negative for rash.  Neurological:  Negative for dizziness, sensory change, loss of consciousness, weakness and headaches.  Endo/Heme/Allergies:  Negative for environmental allergies. Does not bruise/bleed easily.  Psychiatric/Behavioral:  Negative for depression and suicidal ideas. The patient is not nervous/anxious and does not have insomnia.       Objective:  Physical Exam Constitutional:      General: She is not in acute distress.    Appearance: She is well-developed.  HENT:     Head: Normocephalic and atraumatic.  Eyes:     Conjunctiva/sclera: Conjunctivae normal.  Neck:     Thyroid: No thyromegaly.  Cardiovascular:     Rate and Rhythm: Normal rate and regular rhythm.     Heart sounds: Normal heart sounds. No murmur heard. Pulmonary:     Effort: Pulmonary effort is normal. No respiratory distress.     Breath sounds: Normal breath sounds.  Abdominal:     General: Bowel sounds are normal. There is no distension.     Palpations: Abdomen is soft. There is no mass.     Tenderness: There is no abdominal tenderness.  Musculoskeletal:     Cervical back: Neck supple.  Lymphadenopathy:     Cervical: No cervical adenopathy.  Skin:    General: Skin is warm and dry.  Neurological:     Mental Status: She is alert and oriented to person, place, and time.  Psychiatric:        Behavior: Behavior normal.    BP 112/74   Pulse (!) 105   Temp 98.3 F (36.8 C)   Resp 16   Ht 5\' 7"  (1.702 m)   Wt 217 lb 9.6 oz (98.7 kg)   SpO2 92%   BMI 34.08 kg/m  Wt Readings from Last 3 Encounters:  01/30/21 217 lb 9.6 oz (98.7 kg)  08/23/20 223 lb 9.6 oz (101.4 kg)  06/05/20 223 lb 6.4 oz (101.3 kg)    Diabetic Foot Exam - Simple   No data filed    Lab Results  Component Value Date   WBC 5.5 06/05/2020   HGB 14.9 06/05/2020   HCT 44.2 06/05/2020   PLT 352.0 06/05/2020   GLUCOSE 124 (H)  06/21/2020   CHOL 152 06/05/2020   TRIG 90.0 06/05/2020   HDL 55.30 06/05/2020   LDLCALC 79 06/05/2020   ALT 20 06/21/2020   AST 18 06/21/2020   NA 139 06/21/2020   K 4.0 06/21/2020   CL 101 06/21/2020   CREATININE 1.25 (H) 06/21/2020   BUN 31 (H) 06/21/2020   CO2 27 06/21/2020   TSH 1.82 06/05/2020   HGBA1C 7.5 (H) 06/05/2020   MICROALBUR 2.3 (H) 03/27/2015    Lab Results  Component Value Date   TSH 1.82 06/05/2020   Lab Results  Component Value Date   WBC 5.5 06/05/2020   HGB 14.9 06/05/2020   HCT 44.2 06/05/2020   MCV 91.1 06/05/2020   PLT 352.0 06/05/2020   Lab Results  Component Value Date   NA 139 06/21/2020   K 4.0 06/21/2020   CO2 27 06/21/2020   GLUCOSE 124 (H) 06/21/2020   BUN 31 (H) 06/21/2020   CREATININE 1.25 (H) 06/21/2020   BILITOT 0.3 06/21/2020   ALKPHOS 83 06/21/2020   AST 18 06/21/2020   ALT 20 06/21/2020   PROT 6.8 06/21/2020   ALBUMIN 3.9 06/21/2020   CALCIUM 9.2 06/21/2020   GFR 42.41 (L) 06/21/2020   Lab Results  Component Value Date   CHOL 152 06/05/2020   Lab Results  Component Value Date   HDL 55.30 06/05/2020   Lab Results  Component Value Date   LDLCALC 79 06/05/2020   Lab Results  Component Value Date   TRIG 90.0 06/05/2020   Lab Results  Component Value Date   CHOLHDL 3 06/05/2020   Lab Results  Component Value Date   HGBA1C 7.5 (H) 06/05/2020       Assessment & Plan:   Problem List Items Addressed This Visit     Hypertension    Well controlled, no changes to meds. Encouraged heart healthy diet such as the DASH diet and exercise as tolerated.       Relevant Orders   CBC   Comprehensive metabolic panel   TSH   Obesity    Encouraged DASH or MIND diet, decrease po intake and increase exercise as tolerated. Needs 7-8 hours of sleep nightly. Avoid trans fats, eat small, frequent meals every 4-5 hours with lean proteins, complex carbs and healthy fats. Minimize simple carbs, high fat foods and processed  foods. Agrees to try [facility] prescription.      Relevant Medications   tirzepatide [facility]) 2.5 MG/0.5ML Pen   Hyperlipidemia    Tolerating statin, encouraged heart healthy diet, avoid trans fats, minimize simple carbs and saturated fats. Increase exercise as tolerated      Relevant Orders   Lipid panel   Urinary incontinence    Sees urology on 12/9       Diabetes mellitus type 2 in obese ([facility])    hgba1c acceptable, minimize simple carbs. Increase exercise as tolerated. Continue current meds      Relevant Medications   tirzepatide (MOUNJARO) 2.5 MG/0.5ML Pen   Other Relevant Orders   Hemoglobin A1c   DOE (dyspnea on exertion)   Relevant Orders   ECHOCARDIOGRAM COMPLETE   Osteopenia    Encouraged to get adequate exercise, calcium and vitamin d intake      Preventative health care    Patient encouraged to maintain heart healthy diet, regular exercise, adequate sleep. Consider daily probiotics. Take medications as prescribed. Is switching to an [facility] next year to get her Shingrix shots paid for. Labs ordered and reviewed. Colonoscopy in 2016/05/27 at [facility] repeat in 05-27-2021. MGM done in 05-27-20 repeat next year. Has aged out of Paps and her last pap was normal      Feeling grief    Her daughter died in 05-28-18. She has had good support and she is supporting her grandson.  She feels she can look forward now.       LVH (left ventricular hypertrophy)    Moderate on last check but noting some dyspnea. Repeat echo      History of colon polyps - Primary    I have discontinued Caterine Mcmeans. Mcdonnell's traMADol. I am also having her start on tirzepatide. Additionally, I am having her maintain her aspirin, Probiotic Daily, TURMERIC PO, acetaminophen, OVER THE COUNTER MEDICATION, Cyanocobalamin (VITAMIN B 12 PO), ALPRAZolam, KRILL OIL PO, psyllium, docusate sodium, benzonatate, diclofenac, cyclobenzaprine, atorvastatin, triamterene-hydrochlorothiazide, famotidine, escitalopram, and  irbesartan.  Meds ordered this encounter  Medications   tirzepatide (MOUNJARO) 2.5 MG/0.5ML Pen    Sig: Inject 2.5 mg into the skin once a week.    Dispense:  2 mL    Refill:  0     Penni Homans, MD

## 2021-03-07 ENCOUNTER — Ambulatory Visit (HOSPITAL_BASED_OUTPATIENT_CLINIC_OR_DEPARTMENT_OTHER)
Admission: RE | Admit: 2021-03-07 | Discharge: 2021-03-07 | Disposition: A | Discharge status: Home / Self Care | Payer: Medicare Other | Source: Ambulatory Visit | Attending: Family Medicine | Admitting: Family Medicine

## 2021-03-07 ENCOUNTER — Other Ambulatory Visit: Payer: Self-pay | Admitting: Family Medicine

## 2021-03-07 ENCOUNTER — Other Ambulatory Visit: Payer: Self-pay

## 2021-03-07 DIAGNOSIS — E1169 Type 2 diabetes mellitus with other specified complication: Secondary | ICD-10-CM

## 2021-03-07 DIAGNOSIS — I77810 Thoracic aortic ectasia: Secondary | ICD-10-CM

## 2021-03-07 DIAGNOSIS — E782 Mixed hyperlipidemia: Secondary | ICD-10-CM

## 2021-03-07 DIAGNOSIS — R0609 Other forms of dyspnea: Secondary | ICD-10-CM | POA: Diagnosis not present

## 2021-03-07 DIAGNOSIS — I10 Essential (primary) hypertension: Secondary | ICD-10-CM

## 2021-03-07 DIAGNOSIS — I517 Cardiomegaly: Secondary | ICD-10-CM

## 2021-03-07 LAB — ECHOCARDIOGRAM COMPLETE
AR max vel: 2.55 cm2
AV Area VTI: 2.58 cm2
AV Area mean vel: 2.68 cm2
AV Mean grad: 3 mmHg
AV Peak grad: 6.1 mmHg
Ao pk vel: 1.23 m/s
Area-P 1/2: 2.61 cm2
S' Lateral: 2.4 cm

## 2021-03-07 MED ORDER — PERFLUTREN LIPID MICROSPHERE
1.0000 mL | INTRAVENOUS | Status: AC | PRN
  Administered 2021-03-07: 2 mL via INTRAVENOUS

## 2021-03-22 DIAGNOSIS — F32A Depression, unspecified: Secondary | ICD-10-CM | POA: Insufficient documentation

## 2021-03-27 ENCOUNTER — Ambulatory Visit: Payer: Medicare Other | Admitting: Cardiology

## 2021-03-27 ENCOUNTER — Encounter: Payer: Self-pay | Admitting: Cardiology

## 2021-03-27 ENCOUNTER — Other Ambulatory Visit: Payer: Self-pay

## 2021-03-27 VITALS — BP 110/74 | HR 91 | Ht 67.0 in | Wt 213.0 lb

## 2021-03-27 DIAGNOSIS — E782 Mixed hyperlipidemia: Secondary | ICD-10-CM | POA: Diagnosis not present

## 2021-03-27 DIAGNOSIS — I517 Cardiomegaly: Secondary | ICD-10-CM

## 2021-03-27 DIAGNOSIS — I10 Essential (primary) hypertension: Secondary | ICD-10-CM | POA: Diagnosis not present

## 2021-03-27 NOTE — Progress Notes (Signed)
Cardiology Office Note:    Date:  03/27/2021   ID:  Brenda [name], DOB 15-Sep-1945, MRN [id number]  PCP:  Mosie Lukes, MD  Cardiologist:  Shirlee More, MD    Referring MD: Mosie Lukes, MD    ASSESSMENT:    1. Primary hypertension   2. Mixed hyperlipidemia   3. LVH (left ventricular hypertrophy)    PLAN:    In order of problems listed above:  From a cardiology perspective doing well her blood pressure is well controlled on appropriate therapy including proximal and distal diuretic and ARB she will continue to monitor at home we discussed tips for blood pressure checks and I told her when she gets a new cuff to switch from wrist to arm. Stable lipids at target continue over-the-counter fish oil supplement and her statin She has mild stable hypertensive heart disease has not progressed and no findings of heart failure   Next appointment: 1 year at her request I also told her that if she wanted to see Laurann Montana, NP again that she is at the [place] office.   Medication Adjustments/Labs and Tests Ordered: Current medicines are reviewed at length with the patient today.  Concerns regarding medicines are outlined above.  Orders Placed This Encounter  Procedures   EKG 12-Lead   No orders of the defined types were placed in this encounter.   Chief Complaint  Patient presents with   Follow-up   Hypertension    History of Present Illness:    Brenda [name] is a 76 y.o. female with a hx of hypertension hyperlipidemia lower extremity edema not due to heart failure last seen 12/30/2018 by Laurann Montana, NP.  Previously seen by Dr. Stanford Breed her echocardiogram showed hypertensive heart disease and moderate LVH previous myocardial fusion perfusion study 2015 showed no findings of ischemia.  Shortness of breath was felt to be multifactorial including obstructive sleep apnea obesity and deconditioning.  Compliance with diet, lifestyle and medications: Yes  She is  concerned because of the labs and seeing cardiology. I reviewed her recent echocardiogram and I think it is reassuring she has mild changes of hypertensive heart disease no finding of heart failure. Aorta is very mildly enlarged not uncommon in her age group rarely progressive and I reassured her Her EKG shows the same pattern of QS in 3 and aVF that she has had in the past possible inferior infarction very poor predictive accuracy and she has no pattern of MI on her echocardiogram and I would not repeat an ischemia evaluation Home blood pressure runs in the range of 120/70 She is interested in weight loss and is considering given Trulicity She is not having chest pain edema shortness of breath or palpitation.  Had a recent echocardiogram performed 03/07/2021 again showing changes of hypertensive heart disease mild thickening or concentric LVH normal systolic function grade 1 diastolic dysfunction normal right ventricular size and function very mild enlargement of the ascending aorta 37 mm and no significant valvular abnormality.  Left ventricular diastolic pressure did not appear increased Past Medical History:  Diagnosis Date   Depression    counseling   Diabetes mellitus type 2 in obese ([place]) 01/31/2013   Esophageal reflux 08/24/2012   GERD (gastroesophageal reflux disease)    History of chicken pox    childhood age 17   Hyperlipidemia    2011   Hypertension    Left hip pain 03/19/2016   Medicare annual wellness visit, subsequent 12/25/2014   Numerous moles  05/09/2015   Obesity 10/17/2010   Osteopenia 03/19/2016   Other and unspecified hyperlipidemia 04/26/2012   Other and unspecified hyperlipidemia 08/24/2012   Preventative health care 03/24/2016   Sacral fracture ([place]) 05/27/2016   Seasonal allergies    Tinea pedis 03/24/2016   Urinary incontinence 08/24/2012    Past Surgical History:  Procedure Laterality Date   BLADDER REPAIR     2002   TOTAL ABDOMINAL HYSTERECTOMY     2002     Current Medications: Current Meds  Medication Sig   acetaminophen (TYLENOL) 650 MG CR tablet Take 1,300 mg by mouth daily.   ALPRAZolam (XANAX) 0.25 MG tablet Take 0.5-1 tablets (0.125-0.25 mg total) by mouth 2 (two) times daily as needed for anxiety.   aspirin 81 MG tablet Take 81 mg by mouth daily.   atorvastatin (LIPITOR) 80 MG tablet TAKE 1 TABLET BY MOUTH  DAILY   benzonatate (TESSALON) 100 MG capsule Take 1 capsule (100 mg total) by mouth 3 (three) times daily as needed. (Patient taking differently: Take 100 mg by mouth 3 (three) times daily as needed for cough.)   Cyanocobalamin (VITAMIN B 12 PO) Take 1,000 Units by mouth daily.   cyclobenzaprine (FLEXERIL) 10 MG tablet Take 1 tablet by mouth 2 (two) times daily as needed for muscle spasms.   docusate sodium (COLACE) 100 MG capsule Take 100 mg by mouth 2 (two) times daily.   escitalopram (LEXAPRO) 20 MG tablet TAKE 2 TABLETS BY MOUTH  DAILY   famotidine (PEPCID) 40 MG tablet TAKE 1 TABLET BY MOUTH  DAILY   irbesartan (AVAPRO) 150 MG tablet TAKE 1 TABLET BY MOUTH  DAILY   KRILL OIL PO Take 1 capsule by mouth daily.   OVER THE COUNTER MEDICATION as needed (Hemorrohoids). Hemorrohoid supporsitories-As directed   Probiotic Product (PROBIOTIC DAILY) CAPS Take 1 capsule by mouth daily.   triamterene-hydrochlorothiazide (MAXZIDE-25) 37.5-25 MG tablet TAKE 1 TABLET BY MOUTH  DAILY   TURMERIC PO Take 1 capsule by mouth daily.     Allergies:   Meloxicam   Social History   Socioeconomic History   Marital status: Divorced    Spouse name: Not on file   Number of children: Not on file   Years of education: Not on file   Highest education level: Not on file  Occupational History   Not on file  Tobacco Use   Smoking status: Former    Packs/day: 0.20    Years: 15.00    Pack years: 3.00    Types: Cigarettes    Passive exposure: Past   Smokeless tobacco: Never   Tobacco comments:    quit 10 years ago  Vaping Use   Vaping Use:  Never used  Substance and Sexual Activity   Alcohol use: No    Alcohol/week: 0.0 standard drinks   Drug use: No   Sexual activity: Never  Other Topics Concern   Not on file  Social History Narrative   Not on file   Social Determinants of Health   Financial Resource Strain: Medium Risk   Difficulty of Paying Living Expenses: Somewhat hard  Food Insecurity: Food Insecurity Present   Worried About Charity fundraiser in the Last Year: Often true   Arboriculturist in the Last Year: Never true  Transportation Needs: No Transportation Needs   Lack of Transportation (Medical): No   Lack of Transportation (Non-Medical): No  Physical Activity: Inactive   Days of Exercise per Week: 0 days   Minutes  of Exercise per Session: 0 min  Stress: No Stress Concern Present   Feeling of Stress : Not at all  Social Connections: Moderately Isolated   Frequency of Communication with Friends and Family: More than three times a week   Frequency of Social Gatherings with Friends and Family: More than three times a week   Attends Religious Services: More than 4 times per year   Active Member of [employer] or Organizations: No   Attends Music therapist: Never   Marital Status: Divorced     Family History: The patient's family history includes Breast cancer (age of onset: 95) in her sister; Cancer in her paternal grandfather; Colon cancer (age of onset: 9) in her sister; Dementia in her mother; Heart disease (age of onset: 59) in her father; Hypertension in her father; Obesity in her daughter. ROS:   Please see the history of present illness.    All other systems reviewed and are negative.  EKGs/Labs/Other Studies Reviewed:    The following studies were reviewed today:  EKG:  EKG ordered today and personally reviewed.  The ekg ordered today demonstrates sinus rhythm QS in 3 aVF consider possible inferior infarction but poor predictive accuracy and unchanged from her previous EKG 2 years  ago  Recent Labs: 01/30/2021: ALT 13; BUN 29; Creatinine, Ser 1.10; Hemoglobin 13.5; Platelets 380.0; Potassium 3.9; Sodium 140; TSH 1.49  Recent Lipid Panel    Component Value Date/Time   CHOL 165 01/30/2021 1430   CHOL 186 02/02/2018 1035   TRIG 208.0 (H) 01/30/2021 1430   HDL 44.60 01/30/2021 1430   HDL 47 02/02/2018 1035   CHOLHDL 4 01/30/2021 1430   VLDL 41.6 (H) 01/30/2021 1430   LDLCALC 79 06/05/2020 1549   LDLCALC 121 (H) 02/02/2018 1035   LDLDIRECT 100.0 01/30/2021 1430    Physical Exam:    VS:  BP 110/74 (BP Location: Left Arm)    Pulse 91    Ht 5\' 7"  (1.702 m)    Wt 213 lb (96.6 kg)    SpO2 98%    BMI 33.36 kg/m     Wt Readings from Last 3 Encounters:  03/27/21 213 lb (96.6 kg)  01/30/21 217 lb 9.6 oz (98.7 kg)  08/23/20 223 lb 9.6 oz (101.4 kg)     GEN:  Well nourished, well developed in no acute distress HEENT: Normal NECK: No JVD; No carotid bruits LYMPHATICS: No lymphadenopathy CARDIAC: RRR, no murmurs, rubs, gallops RESPIRATORY:  Clear to auscultation without rales, wheezing or rhonchi  ABDOMEN: Soft, non-tender, non-distended MUSCULOSKELETAL:  No edema; No deformity  SKIN: Warm and dry NEUROLOGIC:  Alert and oriented x 3 PSYCHIATRIC:  Normal affect    Signed, Shirlee More, MD  03/27/2021 8:58 AM    [place]

## 2021-03-27 NOTE — Patient Instructions (Signed)

## 2021-03-29 DIAGNOSIS — M47816 Spondylosis without myelopathy or radiculopathy, lumbar region: Secondary | ICD-10-CM | POA: Diagnosis not present

## 2021-03-29 DIAGNOSIS — M5136 Other intervertebral disc degeneration, lumbar region: Secondary | ICD-10-CM | POA: Diagnosis not present

## 2021-03-29 DIAGNOSIS — G8929 Other chronic pain: Secondary | ICD-10-CM | POA: Diagnosis not present

## 2021-03-29 DIAGNOSIS — M549 Dorsalgia, unspecified: Secondary | ICD-10-CM | POA: Diagnosis not present

## 2021-03-29 DIAGNOSIS — M545 Low back pain, unspecified: Secondary | ICD-10-CM | POA: Diagnosis not present

## 2021-04-03 DIAGNOSIS — G8929 Other chronic pain: Secondary | ICD-10-CM | POA: Diagnosis not present

## 2021-04-03 DIAGNOSIS — M25562 Pain in left knee: Secondary | ICD-10-CM | POA: Diagnosis not present

## 2021-04-04 DIAGNOSIS — E1169 Type 2 diabetes mellitus with other specified complication: Secondary | ICD-10-CM | POA: Diagnosis not present

## 2021-04-04 DIAGNOSIS — E1122 Type 2 diabetes mellitus with diabetic chronic kidney disease: Secondary | ICD-10-CM | POA: Diagnosis not present

## 2021-04-04 DIAGNOSIS — E669 Obesity, unspecified: Secondary | ICD-10-CM | POA: Diagnosis not present

## 2021-04-04 DIAGNOSIS — M1712 Unilateral primary osteoarthritis, left knee: Secondary | ICD-10-CM | POA: Diagnosis not present

## 2021-04-04 DIAGNOSIS — N183 Chronic kidney disease, stage 3 unspecified: Secondary | ICD-10-CM | POA: Diagnosis not present

## 2021-04-10 ENCOUNTER — Other Ambulatory Visit: Payer: Self-pay

## 2021-04-10 MED ORDER — ESCITALOPRAM OXALATE 20 MG PO TABS: 40.0000 mg | ORAL_TABLET | Freq: Every day | ORAL | 1 refills | Status: DC

## 2021-04-10 MED ORDER — ATORVASTATIN CALCIUM 80 MG PO TABS: 80.0000 mg | ORAL_TABLET | Freq: Every day | ORAL | 3 refills | Status: DC

## 2021-04-10 MED ORDER — TRIAMTERENE-HCTZ 37.5-25 MG PO TABS: 1.0000 | ORAL_TABLET | Freq: Every day | ORAL | 1 refills | Status: DC

## 2021-04-10 MED ORDER — IRBESARTAN 150 MG PO TABS: 150.0000 mg | ORAL_TABLET | Freq: Every day | ORAL | 1 refills | Status: DC

## 2021-04-10 MED ORDER — FAMOTIDINE 40 MG PO TABS: 40.0000 mg | ORAL_TABLET | Freq: Every day | ORAL | 1 refills | Status: DC

## 2021-04-14 DIAGNOSIS — M5126 Other intervertebral disc displacement, lumbar region: Secondary | ICD-10-CM | POA: Diagnosis not present

## 2021-04-14 DIAGNOSIS — M5136 Other intervertebral disc degeneration, lumbar region: Secondary | ICD-10-CM | POA: Diagnosis not present

## 2021-04-14 DIAGNOSIS — M5137 Other intervertebral disc degeneration, lumbosacral region: Secondary | ICD-10-CM | POA: Diagnosis not present

## 2021-04-14 DIAGNOSIS — M47816 Spondylosis without myelopathy or radiculopathy, lumbar region: Secondary | ICD-10-CM | POA: Diagnosis not present

## 2021-04-14 DIAGNOSIS — M545 Low back pain, unspecified: Secondary | ICD-10-CM | POA: Diagnosis not present

## 2021-04-14 DIAGNOSIS — G8929 Other chronic pain: Secondary | ICD-10-CM | POA: Diagnosis not present

## 2021-04-16 DIAGNOSIS — M1712 Unilateral primary osteoarthritis, left knee: Secondary | ICD-10-CM | POA: Diagnosis not present

## 2021-04-23 DIAGNOSIS — G8929 Other chronic pain: Secondary | ICD-10-CM | POA: Diagnosis not present

## 2021-04-23 DIAGNOSIS — M47816 Spondylosis without myelopathy or radiculopathy, lumbar region: Secondary | ICD-10-CM | POA: Diagnosis not present

## 2021-04-23 DIAGNOSIS — M545 Low back pain, unspecified: Secondary | ICD-10-CM | POA: Diagnosis not present

## 2021-04-26 ENCOUNTER — Other Ambulatory Visit: Payer: Self-pay | Admitting: Family Medicine

## 2021-04-26 ENCOUNTER — Ambulatory Visit
Admission: RE | Admit: 2021-04-26 | Discharge: 2021-04-26 | Disposition: A | Discharge status: Home / Self Care | Payer: Medicare HMO | Source: Ambulatory Visit | Attending: Family Medicine | Admitting: Family Medicine

## 2021-04-26 DIAGNOSIS — R928 Other abnormal and inconclusive findings on diagnostic imaging of breast: Secondary | ICD-10-CM

## 2021-04-26 DIAGNOSIS — R6889 Other general symptoms and signs: Secondary | ICD-10-CM | POA: Diagnosis not present

## 2021-04-26 DIAGNOSIS — R922 Inconclusive mammogram: Secondary | ICD-10-CM | POA: Diagnosis not present

## 2021-05-09 ENCOUNTER — Other Ambulatory Visit: Payer: Self-pay | Admitting: Family Medicine

## 2021-05-11 DIAGNOSIS — E1136 Type 2 diabetes mellitus with diabetic cataract: Secondary | ICD-10-CM | POA: Diagnosis not present

## 2021-05-11 DIAGNOSIS — H119 Unspecified disorder of conjunctiva: Secondary | ICD-10-CM | POA: Diagnosis not present

## 2021-05-11 DIAGNOSIS — Z888 Allergy status to other drugs, medicaments and biological substances status: Secondary | ICD-10-CM | POA: Diagnosis not present

## 2021-05-11 DIAGNOSIS — H2513 Age-related nuclear cataract, bilateral: Secondary | ICD-10-CM | POA: Diagnosis not present

## 2021-05-11 DIAGNOSIS — H52203 Unspecified astigmatism, bilateral: Secondary | ICD-10-CM | POA: Diagnosis not present

## 2021-05-11 DIAGNOSIS — H43813 Vitreous degeneration, bilateral: Secondary | ICD-10-CM | POA: Diagnosis not present

## 2021-05-11 DIAGNOSIS — H524 Presbyopia: Secondary | ICD-10-CM | POA: Diagnosis not present

## 2021-05-11 DIAGNOSIS — R6889 Other general symptoms and signs: Secondary | ICD-10-CM | POA: Diagnosis not present

## 2021-05-11 DIAGNOSIS — H5203 Hypermetropia, bilateral: Secondary | ICD-10-CM | POA: Diagnosis not present

## 2021-05-15 DIAGNOSIS — M48061 Spinal stenosis, lumbar region without neurogenic claudication: Secondary | ICD-10-CM | POA: Diagnosis not present

## 2021-05-15 DIAGNOSIS — Z79891 Long term (current) use of opiate analgesic: Secondary | ICD-10-CM | POA: Diagnosis not present

## 2021-05-15 DIAGNOSIS — M25552 Pain in left hip: Secondary | ICD-10-CM | POA: Diagnosis not present

## 2021-05-15 DIAGNOSIS — M47816 Spondylosis without myelopathy or radiculopathy, lumbar region: Secondary | ICD-10-CM | POA: Diagnosis not present

## 2021-05-15 DIAGNOSIS — M7138 Other bursal cyst, other site: Secondary | ICD-10-CM | POA: Diagnosis not present

## 2021-05-15 DIAGNOSIS — M5116 Intervertebral disc disorders with radiculopathy, lumbar region: Secondary | ICD-10-CM | POA: Diagnosis not present

## 2021-05-22 DIAGNOSIS — M5116 Intervertebral disc disorders with radiculopathy, lumbar region: Secondary | ICD-10-CM | POA: Diagnosis not present

## 2021-05-28 NOTE — Progress Notes (Addendum)
Subjective:    Patient ID: Brenda [name], female    DOB: 07-Oct-1945, 76 y.o.   MRN: [id number]  Chief Complaint  Patient presents with   Follow-up    HPI Patient is in today for a follow up. She is doing well today. No recent febrile illness or hospitalizations. No complaints of polydipsia but she does continue to struggle with urinary incontinence and has yet to make it to the urology office. No dysuria or hematuria. She tries to maintain a heart healthy diet and stay active most day. Denies CP/palp/SOB/HA/congestion/fevers/GI or GU c/o. Taking meds as prescribed   Past Medical History:  Diagnosis Date   Depression    counseling   Diabetes mellitus type 2 in obese (HCC) 01/31/2013   Esophageal reflux 08/24/2012   GERD (gastroesophageal reflux disease)    History of chicken pox    childhood age 24   Hyperlipidemia    2011   Hypertension    Left hip pain 03/19/2016   Medicare annual wellness visit, subsequent 12/25/2014   Numerous moles 05/09/2015   Obesity 10/17/2010   Osteopenia 03/19/2016   Other and unspecified hyperlipidemia 04/26/2012   Other and unspecified hyperlipidemia 08/24/2012   Preventative health care 03/24/2016   Sacral fracture (HCC) 05/27/2016   Seasonal allergies    Tinea pedis 03/24/2016   Urinary incontinence 08/24/2012    Past Surgical History:  Procedure Laterality Date   BLADDER REPAIR     2002   TOTAL ABDOMINAL HYSTERECTOMY     2002    Family History  Problem Relation Age of Onset   Colon cancer Sister 62   Breast cancer Sister 3   Heart disease Father 25   Hypertension Father    Dementia Mother    Cancer Paternal Grandfather        bone   Obesity Daughter     Social History   Socioeconomic History   Marital status: Divorced    Spouse name: Not on file   Number of children: Not on file   Years of education: Not on file   Highest education level: Not on file  Occupational History   Not on file  Tobacco Use   Smoking status: Former     Packs/day: 0.20    Years: 15.00    Pack years: 3.00    Types: Cigarettes    Passive exposure: Past   Smokeless tobacco: Never   Tobacco comments:    quit 10 years ago  Vaping Use   Vaping Use: Never used  Substance and Sexual Activity   Alcohol use: No    Alcohol/week: 0.0 standard drinks   Drug use: No   Sexual activity: Never  Other Topics Concern   Not on file  Social History Narrative   Not on file   Social Determinants of Health   Financial Resource Strain: Medium Risk   Difficulty of Paying Living Expenses: Somewhat hard  Food Insecurity: Food Insecurity Present   Worried About Programme researcher, broadcasting/film/video in the Last Year: Often true   Barista in the Last Year: Never true  Transportation Needs: No Transportation Needs   Lack of Transportation (Medical): No   Lack of Transportation (Non-Medical): No  Physical Activity: Inactive   Days of Exercise per Week: 0 days   Minutes of Exercise per Session: 0 min  Stress: No Stress Concern Present   Feeling of Stress : Not at all  Social Connections: Moderately Isolated   Frequency of Communication with  Friends and Family: More than three times a week   Frequency of Social Gatherings with Friends and Family: More than three times a week   Attends Religious Services: More than 4 times per year   Active Member of [employer] or Organizations: No   Attends Banker Meetings: Never   Marital Status: Divorced  Catering manager Violence: Not At Risk   Fear of Current or Ex-Partner: No   Emotionally Abused: No   Physically Abused: No   Sexually Abused: No    Outpatient Medications Prior to Visit  Medication Sig Dispense Refill   acetaminophen (TYLENOL) 650 MG CR tablet Take 1,300 mg by mouth daily.     ALPRAZolam (XANAX) 0.25 MG tablet Take 0.5-1 tablets (0.125-0.25 mg total) by mouth 2 (two) times daily as needed for anxiety. 20 tablet 1   aspirin 81 MG tablet Take 81 mg by mouth daily.     atorvastatin (LIPITOR) 80  MG tablet Take 1 tablet (80 mg total) by mouth daily. 90 tablet 3   benzonatate (TESSALON) 100 MG capsule Take 1 capsule (100 mg total) by mouth 3 (three) times daily as needed. (Patient taking differently: Take 100 mg by mouth 3 (three) times daily as needed for cough.) 20 capsule 0   Cyanocobalamin (VITAMIN B 12 PO) Take 1,000 Units by mouth daily.     cyclobenzaprine (FLEXERIL) 10 MG tablet Take 1 tablet by mouth 2 (two) times daily as needed for muscle spasms.     docusate sodium (COLACE) 100 MG capsule Take 100 mg by mouth 2 (two) times daily.     escitalopram (LEXAPRO) 20 MG tablet TAKE 2 TABLETS BY MOUTH  DAILY 180 tablet 1   famotidine (PEPCID) 40 MG tablet TAKE 1 TABLET BY MOUTH  DAILY 90 tablet 1   irbesartan (AVAPRO) 150 MG tablet TAKE 1 TABLET BY MOUTH  DAILY 90 tablet 1   KRILL OIL PO Take 1 capsule by mouth daily.     OVER THE COUNTER MEDICATION as needed (Hemorrohoids). Hemorrohoid supporsitories-As directed     Probiotic Product (PROBIOTIC DAILY) CAPS Take 1 capsule by mouth daily.     triamterene-hydrochlorothiazide (MAXZIDE-25) 37.5-25 MG tablet TAKE 1 TABLET BY MOUTH  DAILY 90 tablet 1   TURMERIC PO Take 1 capsule by mouth daily.     famotidine (PEPCID) 40 MG tablet Take 1 tablet by mouth daily.     cyanocobalamin 1000 MCG tablet Take 1 tablet by mouth daily.     No facility-administered medications prior to visit.    Allergies  Allergen Reactions   Meloxicam Nausea Only and Nausea And Vomiting    Review of Systems  Constitutional:  Negative for fever and malaise/fatigue.  HENT:  Negative for congestion.   Eyes:  Negative for blurred vision.  Respiratory:  Negative for shortness of breath.   Cardiovascular:  Negative for chest pain, palpitations and leg swelling.  Gastrointestinal:  Negative for abdominal pain, blood in stool and nausea.  Genitourinary:  Negative for dysuria and frequency.  Musculoskeletal:  Negative for falls.  Skin:  Negative for rash.   Neurological:  Negative for dizziness, loss of consciousness and headaches.  Endo/Heme/Allergies:  Negative for environmental allergies.  Psychiatric/Behavioral:  Negative for depression. The patient is nervous/anxious.       Objective:    Physical Exam Constitutional:      General: She is not in acute distress.    Appearance: She is well-developed.  HENT:     Head: Normocephalic and atraumatic.  Eyes:     Conjunctiva/sclera: Conjunctivae normal.  Neck:     Thyroid: No thyromegaly.  Cardiovascular:     Rate and Rhythm: Normal rate and regular rhythm.     Heart sounds: Normal heart sounds. No murmur heard. Pulmonary:     Effort: Pulmonary effort is normal. No respiratory distress.     Breath sounds: Normal breath sounds.  Abdominal:     General: Bowel sounds are normal. There is no distension.     Palpations: Abdomen is soft. There is no mass.     Tenderness: There is no abdominal tenderness.  Musculoskeletal:     Cervical back: Neck supple.  Lymphadenopathy:     Cervical: No cervical adenopathy.  Skin:    General: Skin is warm and dry.  Neurological:     Mental Status: She is alert and oriented to person, place, and time.  Psychiatric:        Behavior: Behavior normal.    BP 118/70 (BP Location: Left Arm, Patient Position: Sitting, Cuff Size: Normal)   Pulse 91   Resp 20   Ht 5\' 7"  (1.702 m)   Wt 212 lb 9.6 oz (96.4 kg)   SpO2 97%   BMI 33.30 kg/m  Wt Readings from Last 3 Encounters:  05/29/21 212 lb 9.6 oz (96.4 kg)  03/27/21 213 lb (96.6 kg)  01/30/21 217 lb 9.6 oz (98.7 kg)    Diabetic Foot Exam - Simple   No data filed    Lab Results  Component Value Date   WBC 6.8 05/29/2021   HGB 13.8 05/29/2021   HCT 41.6 05/29/2021   PLT 425.0 (H) 05/29/2021   GLUCOSE 75 05/29/2021   CHOL 155 05/29/2021   TRIG 280.0 (H) 05/29/2021   HDL 44.10 05/29/2021   LDLDIRECT 91.0 05/29/2021   LDLCALC 79 06/05/2020   ALT 13 05/29/2021   AST 13 05/29/2021   NA 137  05/29/2021   K 4.3 05/29/2021   CL 98 05/29/2021   CREATININE 0.92 05/29/2021   BUN 35 (H) 05/29/2021   CO2 29 05/29/2021   TSH 1.90 05/29/2021   HGBA1C 7.8 (H) 05/29/2021   MICROALBUR 1.2 05/29/2021    Lab Results  Component Value Date   TSH 1.90 05/29/2021   Lab Results  Component Value Date   WBC 6.8 05/29/2021   HGB 13.8 05/29/2021   HCT 41.6 05/29/2021   MCV 90.3 05/29/2021   PLT 425.0 (H) 05/29/2021   Lab Results  Component Value Date   NA 137 05/29/2021   K 4.3 05/29/2021   CO2 29 05/29/2021   GLUCOSE 75 05/29/2021   BUN 35 (H) 05/29/2021   CREATININE 0.92 05/29/2021   BILITOT 0.4 05/29/2021   ALKPHOS 118 (H) 05/29/2021   AST 13 05/29/2021   ALT 13 05/29/2021   PROT 7.1 05/29/2021   ALBUMIN 4.1 05/29/2021   CALCIUM 9.6 05/29/2021   GFR 60.87 05/29/2021   Lab Results  Component Value Date   CHOL 155 05/29/2021   Lab Results  Component Value Date   HDL 44.10 05/29/2021   Lab Results  Component Value Date   LDLCALC 79 06/05/2020   Lab Results  Component Value Date   TRIG 280.0 (H) 05/29/2021   Lab Results  Component Value Date   CHOLHDL 4 05/29/2021   Lab Results  Component Value Date   HGBA1C 7.8 (H) 05/29/2021       Assessment & Plan:   Problem List Items Addressed This Visit  Hypertension - Primary    Well controlled, no changes to meds. Encouraged heart healthy diet such as the DASH diet and exercise as tolerated.       Relevant Orders   CBC (Completed)   Comprehensive metabolic panel (Completed)   TSH (Completed)   Obesity    Maintain heart healthy diet DASH or MIND diet, decrease po intake and increase exercise as tolerated. Needs 7-8 hours of sleep nightly. Avoid trans fats, eat small, frequent meals every 4-5 hours with lean proteins, complex carbs and healthy fats. Minimize simple carbs, high fat foods and processed foods      Relevant Orders   CBC (Completed)   Comprehensive metabolic panel (Completed)   Anxiety     Uses Alprazolam infrequently but it is helpful when she needs it.       Colon cancer screening   Relevant Orders   Ambulatory referral to Gastroenterology   Hyperlipidemia    Encourage heart healthy diet such as MIND or DASH diet, increase exercise, avoid trans fats, simple carbohydrates and processed foods, consider a krill or fish or flaxseed oil cap daily. Tolerating Atorvastatin.       Urinary incontinence    Has not made it to urology yet. Will place a new referral to urogynecology for further evaluation      Relevant Orders   Ambulatory referral to Urogynecology   Diabetes mellitus type 2 in obese (HCC)    hgba1c acceptable but increasing will add Metformin 500 mg po qam, minimize simple carbs. Increase exercise as tolerated. Continue current meds      Relevant Orders   Hemoglobin A1c (Completed)   Microalbumin / creatinine urine ratio (Completed)   DOE (dyspnea on exertion)   Osteopenia    Encouraged to get adequate exercise, calcium and vitamin d intake      Preventative health care   Relevant Orders   CBC (Completed)   Comprehensive metabolic panel (Completed)   Lipid panel (Completed)   History of colon polyps   Other Visit Diagnoses     Hyperlipidemia associated with type 2 diabetes mellitus (HCC)       Relevant Orders   Lipid panel (Completed)   TSH (Completed)   LDL cholesterol, direct (Completed)       I have discontinued Jaymeson Bakeman. Onley's cyanocobalamin. I am also having her maintain her aspirin, Probiotic Daily, TURMERIC PO, acetaminophen, OVER THE COUNTER MEDICATION, Cyanocobalamin (VITAMIN B 12 PO), ALPRAZolam, KRILL OIL PO, docusate sodium, benzonatate, cyclobenzaprine, atorvastatin, escitalopram, famotidine, triamterene-hydrochlorothiazide, and irbesartan.  No orders of the defined types were placed in this encounter.  Bradd Canary, MD

## 2021-05-29 ENCOUNTER — Encounter: Payer: Self-pay | Admitting: Family Medicine

## 2021-05-29 ENCOUNTER — Ambulatory Visit (INDEPENDENT_AMBULATORY_CARE_PROVIDER_SITE_OTHER): Payer: Medicare HMO | Admitting: Family Medicine

## 2021-05-29 VITALS — BP 118/70 | HR 91 | Resp 20 | Ht 67.0 in | Wt 212.6 lb

## 2021-05-29 DIAGNOSIS — E1169 Type 2 diabetes mellitus with other specified complication: Secondary | ICD-10-CM

## 2021-05-29 DIAGNOSIS — M8580 Other specified disorders of bone density and structure, unspecified site: Secondary | ICD-10-CM | POA: Diagnosis not present

## 2021-05-29 DIAGNOSIS — R0609 Other forms of dyspnea: Secondary | ICD-10-CM | POA: Diagnosis not present

## 2021-05-29 DIAGNOSIS — E785 Hyperlipidemia, unspecified: Secondary | ICD-10-CM | POA: Diagnosis not present

## 2021-05-29 DIAGNOSIS — Z1211 Encounter for screening for malignant neoplasm of colon: Secondary | ICD-10-CM

## 2021-05-29 DIAGNOSIS — I10 Essential (primary) hypertension: Secondary | ICD-10-CM

## 2021-05-29 DIAGNOSIS — Z86010 Personal history of colonic polyps: Secondary | ICD-10-CM

## 2021-05-29 DIAGNOSIS — E669 Obesity, unspecified: Secondary | ICD-10-CM

## 2021-05-29 DIAGNOSIS — F419 Anxiety disorder, unspecified: Secondary | ICD-10-CM

## 2021-05-29 DIAGNOSIS — E782 Mixed hyperlipidemia: Secondary | ICD-10-CM | POA: Diagnosis not present

## 2021-05-29 DIAGNOSIS — R32 Unspecified urinary incontinence: Secondary | ICD-10-CM | POA: Diagnosis not present

## 2021-05-29 DIAGNOSIS — Z0000 Encounter for general adult medical examination without abnormal findings: Secondary | ICD-10-CM

## 2021-05-29 NOTE — Patient Instructions (Addendum)
Shingrix is the new shingles shot, 2 shots over 2-6 months, confirm coverage with insurance and document, then can return here for shots with nurse appt or at pharmacy  ? ?Urinary Incontinence ?Urinary incontinence refers to a condition in which a person is unable to control where and when to pass urine. A person with this condition will urinate involuntarily. This means that the person urinates when he or she does not mean to. ?What are the causes? ?This condition may be caused by: ?Medicines. ?Infections. ?Constipation. ?Overactive bladder muscles. ?Weak bladder muscles. ?Weak pelvic floor muscles. These muscles provide support for the bladder, intestine, and, in women, the uterus. ?Enlarged prostate in men. The prostate is a gland near the bladder. When it gets too big, it can pinch the urethra. With the urethra blocked, the bladder can weaken and lose the ability to empty properly. ?Surgery. ?Emotional factors, such as anxiety, stress, or post-traumatic stress disorder (PTSD). ?Spinal cord injury, nerve injury, or other neurological conditions. ?Pelvic organ prolapse. This happens in women when organs move out of place and into the vagina. This movement can prevent the bladder and urethra from working properly. ?What increases the risk? ?The following factors may make you more likely to develop this condition: ?Age. The older you are, the higher the risk. ?Obesity. ?Being physically inactive. ?Pregnancy and childbirth. ?Menopause. ?Diseases that affect the nerves or spinal cord. ?Long-term, or chronic, coughing. This can increase pressure on the bladder and pelvic floor muscles. ?What are the signs or symptoms? ?Symptoms may vary depending on the type of urinary incontinence you have. They include: ?A sudden urge to urinate, and passing urine involuntarily before you can get to a bathroom (urge incontinence). ?Suddenly passing urine when doing activities that force urine to pass, such as coughing, laughing,  exercising, or sneezing (stress incontinence). ?Needing to urinate often but urinating only a small amount, or constantly dribbling urine (overflow incontinence). ?Urinating because you cannot get to the bathroom in time due to a physical disability, such as arthritis or injury, or due to a communication or thinking problem, such as Alzheimer's disease (functional incontinence). ?How is this diagnosed? ?This condition may be diagnosed based on: ?Your medical history. ?A physical exam. ?Tests, such as: ?Urine tests. ?X-rays of your kidney and bladder. ?Ultrasound. ?CT scan. ?Cystoscopy. In this procedure, a health care provider inserts a tube with a light and camera (cystoscope) through the urethra and into the bladder to check for problems. ?Urodynamic testing. These tests assess how well the bladder, urethra, and sphincter can store and release urine. There are different types of urodynamic tests, and they vary depending on what the test is measuring. ?To help diagnose your condition, your health care provider may recommend that you keep a log of when you urinate and how much you urinate. ?How is this treated? ?Treatment for this condition depends on the type of incontinence that you have and its cause. Treatment may include: ?Lifestyle changes, such as: ?Quitting smoking. ?Maintaining a healthy weight. ?Staying active. Try to get 150 minutes of moderate-intensity exercise every week. Ask your health care provider which activities are safe for you. ?Eating a healthy diet. ?Avoid high-fat foods, like fried foods. ?Avoid refined carbohydrates like white bread and white rice. ?Limit how much alcohol and caffeine you drink. ?Increase your fiber intake. Healthy sources of fiber include beans, whole grains, and fresh fruits and vegetables. ?Behavioral changes, such as: ?Pelvic floor muscle exercises. ?Bladder training, such as lengthening the amount of time between bathroom breaks,  or using the bathroom at regular  intervals. ?Using techniques to suppress bladder urges. This can include distraction techniques or controlled breathing exercises. ?Medicines, such as: ?Medicines to relax the bladder muscles and prevent bladder spasms. ?Medicines to help slow or prevent the growth of a man's prostate. ?Botox injections. These can help relax the bladder muscles. ?Treatments, such as: ?Using pulses of electricity to help change bladder reflexes (electrical nerve stimulation). ?For women, using a medical device to prevent urine leaks. This is a small, tampon-like, disposable device that is inserted into the urethra. ?Injecting collagen or carbon beads (bulking agents) into the urinary sphincter. These can help thicken tissue and close the bladder opening. ?Surgery. ?Follow these instructions at home: ?Lifestyle ?Limit alcohol and caffeine. These can fill your bladder quickly and irritate it. ?Keep yourself clean to help prevent odors and skin damage. Ask your health care provider about special skin creams and cleansers that can protect the skin from urine. ?Consider wearing pads or adult diapers. Make sure to change them regularly, and always change them right after experiencing incontinence. ?General instructions ?Take over-the-counter and prescription medicines only as told by your health care provider. ?Use the bathroom about every 3-4 hours, even if you do not feel the need to urinate. Try to empty your bladder completely every time. After urinating, wait a minute. Then try to urinate again. ?Make sure you are in a relaxed position while urinating. ?If your incontinence is caused by nerve problems, keep a log of the medicines you take and the times you go to the bathroom. ?Keep all follow-up visits. This is important. ?Where to find more information ?[employer] of Diabetes and Digestive and Kidney Diseases: [employer] ?American Urology Association: www.urologyhealth.org ?Contact a health care provider if: ?You have  pain that gets worse. ?Your incontinence gets worse. ?Get help right away if: ?You have a fever or chills. ?You are unable to urinate. ?You have redness in your groin area or down your legs. ?Summary ?Urinary incontinence refers to a condition in which a person is unable to control where and when to pass urine. ?This condition may be caused by medicines, infection, weak bladder muscles, weak pelvic floor muscles, enlargement of the prostate (in men), or surgery. ?Factors such as older age, obesity, pregnancy and childbirth, menopause, neurological diseases, and chronic coughing may increase your risk for developing this condition. ?Types of urinary incontinence include urge incontinence, stress incontinence, overflow incontinence, and functional incontinence. ?This condition is usually treated first with lifestyle and behavioral changes, such as quitting smoking, eating a healthier diet, and doing regular pelvic floor exercises. Other treatment options include medicines, bulking agents, medical devices, electrical nerve stimulation, or surgery. ?This information is not intended to replace advice given to you by your health care provider. Make sure you discuss any questions you have with your health care provider. ?Document Revised: 09/24/2019 Document Reviewed: 09/24/2019 ?Elsevier Patient Education ? [place]. ? ?

## 2021-05-30 LAB — MICROALBUMIN / CREATININE URINE RATIO
Creatinine,U: 130.6 mg/dL
Microalb Creat Ratio: 0.9 mg/g (ref 0.0–30.0)
Microalb, Ur: 1.2 mg/dL (ref 0.0–1.9)

## 2021-05-30 LAB — COMPREHENSIVE METABOLIC PANEL
ALT: 13 U/L (ref 0–35)
AST: 13 U/L (ref 0–37)
Albumin: 4.1 g/dL (ref 3.5–5.2)
Alkaline Phosphatase: 118 U/L — ABNORMAL HIGH (ref 39–117)
BUN: 35 mg/dL — ABNORMAL HIGH (ref 6–23)
CO2: 29 mEq/L (ref 19–32)
Calcium: 9.6 mg/dL (ref 8.4–10.5)
Chloride: 98 mEq/L (ref 96–112)
Creatinine, Ser: 0.92 mg/dL (ref 0.40–1.20)
GFR: 60.87 mL/min (ref >60.00)
Glucose, Bld: 75 mg/dL (ref 70–99)
Potassium: 4.3 mEq/L (ref 3.5–5.1)
Sodium: 137 mEq/L (ref 135–145)
Total Bilirubin: 0.4 mg/dL (ref 0.2–1.2)
Total Protein: 7.1 g/dL (ref 6.0–8.3)

## 2021-05-30 LAB — LIPID PANEL
Cholesterol: 155 mg/dL (ref 0–200)
HDL: 44.1 mg/dL (ref >39.00)
NonHDL: 111.03
Total CHOL/HDL Ratio: 4
Triglycerides: 280 mg/dL — ABNORMAL HIGH (ref 0.0–149.0)
VLDL: 56 mg/dL — ABNORMAL HIGH (ref 0.0–40.0)

## 2021-05-30 LAB — TSH: TSH: 1.9 u[IU]/mL (ref 0.35–5.50)

## 2021-05-30 LAB — CBC
HCT: 41.6 % (ref 36.0–46.0)
Hemoglobin: 13.8 g/dL (ref 12.0–15.0)
MCHC: 33 g/dL (ref 30.0–36.0)
MCV: 90.3 fl (ref 78.0–100.0)
Platelets: 425 10*3/uL — ABNORMAL HIGH (ref 150.0–400.0)
RBC: 4.61 Mil/uL (ref 3.87–5.11)
RDW: 14.9 % (ref 11.5–15.5)
WBC: 6.8 10*3/uL (ref 4.0–10.5)

## 2021-05-30 LAB — HEMOGLOBIN A1C: Hgb A1c MFr Bld: 7.8 % — ABNORMAL HIGH (ref 4.6–6.5)

## 2021-05-30 LAB — LDL CHOLESTEROL, DIRECT: Direct LDL: 91 mg/dL

## 2021-05-30 NOTE — Assessment & Plan Note (Signed)
Well controlled, no changes to meds. Encouraged heart healthy diet such as the DASH diet and exercise as tolerated.  °

## 2021-05-30 NOTE — Assessment & Plan Note (Signed)
Encourage heart healthy diet such as MIND or DASH diet, increase exercise, avoid trans fats, simple carbohydrates and processed foods, consider a krill or fish or flaxseed oil cap daily. Tolerating Atorvastatin 

## 2021-05-30 NOTE — Assessment & Plan Note (Signed)
hgba1c acceptable but increasing will add Metformin 500 mg po qam, minimize simple carbs. Increase exercise as tolerated. Continue current meds ?

## 2021-05-30 NOTE — Assessment & Plan Note (Signed)
Encouraged to get adequate exercise, calcium and vitamin d intake 

## 2021-05-30 NOTE — Assessment & Plan Note (Signed)
Maintain heart healthy diet DASH or MIND diet, decrease po intake and increase exercise as tolerated. Needs 7-8 hours of sleep nightly. Avoid trans fats, eat small, frequent meals every 4-5 hours with lean proteins, complex carbs and healthy fats. Minimize simple carbs, high fat foods and processed foods ?

## 2021-05-30 NOTE — Assessment & Plan Note (Signed)
Has not made it to urology yet. Will place a new referral to urogynecology for further evaluation ?

## 2021-05-30 NOTE — Assessment & Plan Note (Signed)
Uses Alprazolam infrequently but it is helpful when she needs it.  ?

## 2021-06-01 MED ORDER — METFORMIN HCL 500 MG PO TABS: ORAL_TABLET | ORAL | 3 refills | Status: DC

## 2021-06-01 NOTE — Addendum Note (Signed)
Addended by: Lynnea Ferrier R on: 06/01/2021 08:58 AM ? ? Modules accepted: Orders ? ?

## 2021-06-29 DIAGNOSIS — M5116 Intervertebral disc disorders with radiculopathy, lumbar region: Secondary | ICD-10-CM | POA: Diagnosis not present

## 2021-06-29 DIAGNOSIS — R7303 Prediabetes: Secondary | ICD-10-CM | POA: Diagnosis not present

## 2021-06-29 DIAGNOSIS — Z79891 Long term (current) use of opiate analgesic: Secondary | ICD-10-CM | POA: Diagnosis not present

## 2021-06-29 DIAGNOSIS — M47816 Spondylosis without myelopathy or radiculopathy, lumbar region: Secondary | ICD-10-CM | POA: Diagnosis not present

## 2021-08-13 ENCOUNTER — Telehealth: Payer: Self-pay | Admitting: Family Medicine

## 2021-08-13 NOTE — Telephone Encounter (Signed)
Pharmacy updated.

## 2021-08-13 NOTE — Telephone Encounter (Signed)
Pt called stating she has changed insurance and needs her meds refilled and sent to the following mail service pharmacy going forward.   [facility] Fax: [phone] Phone: [phone]

## 2021-08-24 ENCOUNTER — Other Ambulatory Visit: Payer: Self-pay

## 2021-08-24 DIAGNOSIS — E1169 Type 2 diabetes mellitus with other specified complication: Secondary | ICD-10-CM

## 2021-08-24 MED ORDER — METFORMIN HCL 500 MG PO TABS: ORAL_TABLET | ORAL | 3 refills | Status: DC

## 2021-08-24 MED ORDER — TRIAMTERENE-HCTZ 37.5-25 MG PO TABS
1.0000 | ORAL_TABLET | Freq: Every day | ORAL | 1 refills | Status: DC
Start: 2021-08-24 — End: 2022-04-10

## 2021-08-24 MED ORDER — IRBESARTAN 150 MG PO TABS: 150.0000 mg | ORAL_TABLET | Freq: Every day | ORAL | 1 refills | Status: DC

## 2021-08-24 MED ORDER — FAMOTIDINE 40 MG PO TABS: 40.0000 mg | ORAL_TABLET | Freq: Every day | ORAL | 1 refills | Status: DC

## 2021-08-24 MED ORDER — ATORVASTATIN CALCIUM 80 MG PO TABS: 80.0000 mg | ORAL_TABLET | Freq: Every day | ORAL | 3 refills | Status: DC

## 2021-08-24 MED ORDER — ESCITALOPRAM OXALATE 20 MG PO TABS
40.0000 mg | ORAL_TABLET | Freq: Every day | ORAL | 1 refills | Status: DC
Start: 2021-08-24 — End: 2022-05-13

## 2021-08-28 ENCOUNTER — Ambulatory Visit (INDEPENDENT_AMBULATORY_CARE_PROVIDER_SITE_OTHER): Payer: Medicare HMO

## 2021-08-28 VITALS — BP 128/84 | HR 87 | Temp 98.1°F | Resp 16 | Ht 67.0 in | Wt 210.0 lb

## 2021-08-28 DIAGNOSIS — Z0000 Encounter for general adult medical examination without abnormal findings: Secondary | ICD-10-CM

## 2021-08-28 NOTE — Progress Notes (Signed)
Subjective:   Brenda [name] is a 76 y.o. female who presents for Medicare Annual (Subsequent) preventive examination.  Review of Systems     Cardiac Risk Factors include: advanced age (>8mn, >>73women);diabetes mellitus;hypertension;dyslipidemia;obesity (BMI >30kg/m2)     Objective:    Today's Vitals   08/28/21 1350  BP: 128/84  Pulse: 87  Resp: 16  Temp: 98.1 F (36.7 C)  SpO2: 98%  Weight: 210 lb (95.3 kg)  Height: '5\' 7"'$  (1.702 m)  PainSc: 8    Body mass index is 32.89 kg/m.     08/28/2021    1:48 PM 08/23/2020    2:29 PM 05/24/2019    1:16 PM 05/18/2018    3:27 PM 03/29/2016    1:22 PM 12/25/2014   11:06 PM 05/01/2014    8:38 PM  Advanced Directives  Does Patient Have a Medical Advance Directive? No No No Yes No No No  Does patient want to make changes to medical advance directive?    Yes (MAU/Ambulatory/Procedural Areas - Information given)     Would patient like information on creating a medical advance directive? No - Patient declined Yes (MAU/Ambulatory/Procedural Areas - Information given) No - Patient declined  Yes (MAU/Ambulatory/Procedural Areas - Information given) Yes - Educational materials given Yes - Educational materials given    Current Medications (verified) Outpatient Encounter Medications as of 08/28/2021  Medication Sig   acetaminophen (TYLENOL) 650 MG CR tablet Take 1,300 mg by mouth daily.   ALPRAZolam (XANAX) 0.25 MG tablet Take 0.5-1 tablets (0.125-0.25 mg total) by mouth 2 (two) times daily as needed for anxiety.   aspirin 81 MG tablet Take 81 mg by mouth daily.   atorvastatin (LIPITOR) 80 MG tablet Take 1 tablet (80 mg total) by mouth daily.   benzonatate (TESSALON) 100 MG capsule Take 1 capsule (100 mg total) by mouth 3 (three) times daily as needed. (Patient taking differently: Take 100 mg by mouth 3 (three) times daily as needed for cough.)   Cyanocobalamin (VITAMIN B 12 PO) Take 1,000 Units by mouth daily.   cyclobenzaprine (FLEXERIL) 10  MG tablet Take 1 tablet by mouth 2 (two) times daily as needed for muscle spasms.   docusate sodium (COLACE) 100 MG capsule Take 100 mg by mouth 2 (two) times daily.   escitalopram (LEXAPRO) 20 MG tablet Take 2 tablets (40 mg total) by mouth daily.   famotidine (PEPCID) 40 MG tablet Take 1 tablet (40 mg total) by mouth daily.   irbesartan (AVAPRO) 150 MG tablet Take 1 tablet (150 mg total) by mouth daily.   KRILL OIL PO Take 1 capsule by mouth daily.   metFORMIN (GLUCOPHAGE) 500 MG tablet TAKE 1 TABLET ('500MG'$ ) DAILY BEFORE LUNCH.   OVER THE COUNTER MEDICATION as needed (Hemorrohoids). Hemorrohoid supporsitories-As directed   Probiotic Product (PROBIOTIC DAILY) CAPS Take 1 capsule by mouth daily.   triamterene-hydrochlorothiazide (MAXZIDE-25) 37.5-25 MG tablet Take 1 tablet by mouth daily.   TURMERIC PO Take 1 capsule by mouth daily.   No facility-administered encounter medications on file as of 08/28/2021.    Allergies (verified) Meloxicam   History: Past Medical History:  Diagnosis Date   Depression    counseling   Diabetes mellitus type 2 in [place] ([place] 01/31/2013   Esophageal reflux 08/24/2012   GERD (gastroesophageal reflux disease)    History of chicken pox    childhood age 76  Hyperlipidemia    2011   Hypertension    Left hip pain 03/19/2016  Medicare annual wellness visit, subsequent 12/25/2014   Numerous moles 05/09/2015   Obesity 10/17/2010   Osteopenia 03/19/2016   Other and unspecified hyperlipidemia 04/26/2012   Other and unspecified hyperlipidemia 08/24/2012   Preventative health care 03/24/2016   Sacral fracture ([place]) 05/27/2016   Seasonal allergies    Tinea pedis 03/24/2016   Urinary incontinence 08/24/2012   Past Surgical History:  Procedure Laterality Date   BLADDER REPAIR     2002   TOTAL ABDOMINAL HYSTERECTOMY     2002   Family History  Problem Relation Age of Onset   Colon cancer Sister 37   Breast cancer Sister 33   Heart disease Father 63    Hypertension Father    Dementia Mother    Cancer Paternal Grandfather        bone   Obesity Daughter    Social History   Socioeconomic History   Marital status: Divorced    Spouse name: Not on file   Number of children: Not on file   Years of education: Not on file   Highest education level: Not on file  Occupational History   Not on file  Tobacco Use   Smoking status: Former    Packs/day: 0.20    Years: 15.00    Total pack years: 3.00    Types: Cigarettes    Passive exposure: Past   Smokeless tobacco: Never   Tobacco comments:    quit 10 years ago  Vaping Use   Vaping Use: Never used  Substance and Sexual Activity   Alcohol use: No    Alcohol/week: 0.0 standard drinks of alcohol   Drug use: No   Sexual activity: Never  Other Topics Concern   Not on file  Social History Narrative   Not on file   Social Determinants of Health   Financial Resource Strain: Medium Risk (08/23/2020)   Overall Financial Resource Strain (CARDIA)    Difficulty of Paying Living Expenses: Somewhat hard  Food Insecurity: Food Insecurity Present (08/23/2020)   Hunger Vital Sign    Worried About Running Out of Food in the Last Year: Often true    Ran Out of Food in the Last Year: Never true  Transportation Needs: No Transportation Needs (08/23/2020)   PRAPARE - Hydrologist (Medical): No    Lack of Transportation (Non-Medical): No  Physical Activity: Inactive (08/23/2020)   Exercise Vital Sign    Days of Exercise per Week: 0 days    Minutes of Exercise per Session: 0 min  Stress: No Stress Concern Present (08/23/2020)   [place]    Feeling of Stress : Not at all  Social Connections: Moderately Isolated (08/23/2020)   Social Connection and Isolation Panel [NHANES]    Frequency of Communication with Friends and Family: More than three times a week    Frequency of Social Gatherings with Friends and  Family: More than three times a week    Attends Religious Services: More than 4 times per year    Active Member of [employer] or Organizations: No    Attends Music therapist: Never    Marital Status: Divorced    Tobacco Counseling Counseling given: Not Answered Tobacco comments: quit 10 years ago   Clinical Intake:  Pre-visit preparation completed: Yes  Pain : 0-10 Pain Score: 8  Pain Type: Chronic pain Pain Location: Knee Pain Descriptors / Indicators: Aching, Dull, Sore Pain Onset: More than a month  ago Pain Frequency: Constant     BMI - recorded: 32.89 Nutritional Status: BMI > 30  Obese Nutritional Risks: None Diabetes: Yes CBG done?: No Did pt. bring in CBG monitor from home?: No  How often do you need to have someone help you when you read instructions, pamphlets, or other written materials from your doctor or pharmacy?: 1 - Never  Diabetic?yes Nutrition Risk Assessment:  Has the patient had any N/V/D within the last 2 months?  No  Does the patient have any non-healing wounds?  No  Has the patient had any unintentional weight loss or weight gain?  No   Diabetes:  Is the patient diabetic?  Yes  If diabetic, was a CBG obtained today?  No  Did the patient bring in their glucometer from home?  No  How often do you monitor your CBG's? N/A.   Financial Strains and Diabetes Management:  Are you having any financial strains with the device, your supplies or your medication? No .  Does the patient want to be seen by Chronic Care Management for management of their diabetes?  No  Would the patient like to be referred to a Nutritionist or for Diabetic Management?  No   Diabetic Exams:  Diabetic Eye Exam: Overdue for diabetic eye exam. Pt has been advised about the importance in completing this exam. Patient advised to call and schedule an eye exam. Diabetic Foot Exam: Overdue, Pt has been advised about the importance in completing this exam. Pt is  scheduled for diabetic foot exam on N/A.   Interpreter Needed?: No  Information entered by :: [place] of Daily Living    08/28/2021    1:58 PM  In your present state of health, do you have any difficulty performing the following activities:  Hearing? 0  Vision? 0  Difficulty concentrating or making decisions? 0  Walking or climbing stairs? 0  Dressing or bathing? 0  Doing errands, shopping? 0  Preparing Food and eating ? N  Using the Toilet? N  In the past six months, have you accidently leaked urine? Y  Do you have problems with loss of bowel control? N  Managing your Medications? N  Managing your Finances? N  Housekeeping or managing your Housekeeping? N    Patient Care Team: Mosie Lukes, MD as PCP - General (Family Medicine) Stanford Breed Denice Bors, MD as PCP - Cardiology (Cardiology)  Indicate any recent Medical Services you may have received from other than [facility] providers in the past year (date may be approximate).     Assessment:   This is a routine wellness examination for [place].  Hearing/Vision screen No results found.  Dietary issues and exercise activities discussed: Current Exercise Habits: The patient does not participate in regular exercise at present, Exercise limited by: orthopedic condition(s)   Goals Addressed   None    Depression Screen    08/28/2021    1:50 PM 05/29/2021    2:56 PM 08/23/2020    2:32 PM 06/05/2020    3:25 PM 05/24/2019    1:21 PM 05/18/2018    3:30 PM 08/29/2017    8:03 AM  PHQ 2/9 Scores  PHQ - 2 Score 0 0 0 0 0 0 0  PHQ- 9 Score  0         Fall Risk    08/28/2021    1:48 PM 05/29/2021    2:59 PM 08/23/2020    2:32 PM 06/05/2020    3:26 PM  05/24/2019    1:20 PM  Fall Risk   Falls in the past year? 0 0 0 0 0  Number falls in past yr: 0 0 0  0  Injury with Fall? 0 0 0  0  Risk for fall due to : No Fall Risks      Follow up Falls evaluation completed Falls evaluation completed Falls prevention discussed   Education provided;Falls prevention discussed    FALL RISK PREVENTION PERTAINING TO THE HOME:  Any stairs in or around the home? Yes  If so, are there any without handrails? No  Home free of loose throw rugs in walkways, pet beds, electrical cords, etc? Yes  Adequate lighting in your home to reduce risk of falls? Yes   ASSISTIVE DEVICES UTILIZED TO PREVENT FALLS:  Life alert? No  Use of a cane, walker or w/c? No  Grab bars in the bathroom? No  Shower chair or bench in shower? No  Elevated toilet seat or a handicapped toilet? No   TIMED UP AND GO:  Was the test performed? Yes .  Length of time to ambulate 10 feet: 10 sec.   Gait steady and fast without use of assistive device  Cognitive Function:    03/29/2016    1:24 PM  MMSE - Mini Mental State Exam  Orientation to time 5  Orientation to Place 5  Registration 3  Attention/ Calculation 5  Recall 3  Language- name 2 objects 2  Language- repeat 1  Language- follow 3 step command 3  Language- read & follow direction 1  Write a sentence 1  Copy design 1  Total score 30        08/28/2021    2:04 PM  6CIT Screen  What Year? 0 points  What month? 0 points  What time? 0 points  Count back from 20 0 points  Months in reverse 2 points  Repeat phrase 10 points  Total Score 12 points    Immunizations Immunization History  Administered Date(s) Administered   Fluad Quad(high Dose 65+) 10/26/2018   Influenza Split 01/01/2010, 01/16/2011, 11/25/2011, 01/03/2014   Influenza Whole 12/31/2012   Influenza, High Dose Seasonal PF 11/15/2016, 10/26/2018   Influenza,inj,Quad PF,6+ Mos 12/20/2013, 12/19/2014   Influenza-Unspecified 11/09/2015, 11/25/2017, 11/17/2019, 01/02/2021   PFIZER(Purple Top)SARS-COV-2 Vaccination 04/08/2019, 04/29/2019, 12/12/2019, 07/07/2020   Pneumococcal Conjugate-13 01/25/2013   Pneumococcal Polysaccharide-23 12/21/2007, 12/19/2014   Tdap 04/17/2011    TDAP status: Due, Education has been  provided regarding the importance of this vaccine. Advised may receive this vaccine at local pharmacy or Health Dept. Aware to provide a copy of the vaccination record if obtained from local pharmacy or Health Dept. Verbalized acceptance and understanding.  Flu Vaccine status: Up to date  Pneumococcal vaccine status: Up to date  Covid-19 vaccine status: Information provided on how to obtain vaccines.   Qualifies for Shingles Vaccine? Yes   Zostavax completed No   Shingrix Completed?: No.    Education has been provided regarding the importance of this vaccine. Patient has been advised to call insurance company to determine out of pocket expense if they have not yet received this vaccine. Advised may also receive vaccine at local pharmacy or Health Dept. Verbalized acceptance and understanding.  Screening Tests Health Maintenance  Topic Date Due   FOOT EXAM  03/26/2016   COVID-19 Vaccine (5 - Booster for Pfizer series) 09/01/2020   COLONOSCOPY (Pts 45-60yr Insurance coverage will need to be confirmed)  04/22/2021   OPHTHALMOLOGY EXAM  04/24/2021   Zoster Vaccines- Shingrix (1 of 2) 08/29/2021 (Originally 10/19/1964)   TETANUS/TDAP  05/30/2022 (Originally 04/16/2021)   INFLUENZA VACCINE  10/02/2021   MAMMOGRAM  10/20/2021   HEMOGLOBIN A1C  11/29/2021   Pneumonia Vaccine 87+ Years old  Completed   DEXA SCAN  Completed   Hepatitis C Screening  Completed   HPV VACCINES  Aged Out    Health Maintenance  Health Maintenance Due  Topic Date Due   FOOT EXAM  03/26/2016   COVID-19 Vaccine (5 - Booster for Pfizer series) 09/01/2020   COLONOSCOPY (Pts 45-89yr Insurance coverage will need to be confirmed)  04/22/2021   OPHTHALMOLOGY EXAM  04/24/2021    Colorectal cancer screening: Referral to GI placed will schedule. Pt aware the office will call re: appt.  Mammogram status: Completed 10/20/20. Repeat every year  Bone Density status: Completed 10/02/20. Results reflect: Bone density results:  OSTEOPENIA. Repeat every 2 years.  Lung Cancer Screening: (Low Dose CT Chest recommended if Age 76-80years, 30 pack-year currently smoking OR have quit w/in 15years.) does not qualify.   Lung Cancer Screening Referral: n/a  Additional Screening:  Hepatitis C Screening: does qualify; Completed 12/19/14  Vision Screening: Recommended annual ophthalmology exams for early detection of glaucoma and other disorders of the eye. Is the patient up to date with their annual eye exam?  Yes  Who is the provider or what is the name of the office in which the patient attends annual eye exams? Dr. FTrinna PostIf pt is not established with a provider, would they like to be referred to a provider to establish care? Yes .   Dental Screening: Recommended annual dental exams for proper oral hygiene  Community Resource Referral / Chronic Care Management: CRR required this visit?  No   CCM required this visit?  No      Plan:     I have personally reviewed and noted the following in the patient's chart:   Medical and social history Use of alcohol, tobacco or illicit drugs  Current medications and supplements including opioid prescriptions.  Functional ability and status Nutritional status Physical activity Advanced directives List of other physicians Hospitalizations, surgeries, and ER visits in previous 12 months Vitals Screenings to include cognitive, depression, and falls Referrals and appointments  In addition, I have reviewed and discussed with patient certain preventive protocols, quality metrics, and best practice recommendations. A written personalized care plan for preventive services as well as general preventive health recommendations were provided to patient.     SDuard BradyChism, [place]  08/28/2021   Nurse Notes: none

## 2021-09-27 DIAGNOSIS — M47816 Spondylosis without myelopathy or radiculopathy, lumbar region: Secondary | ICD-10-CM | POA: Diagnosis not present

## 2021-09-27 DIAGNOSIS — M5416 Radiculopathy, lumbar region: Secondary | ICD-10-CM | POA: Diagnosis not present

## 2021-09-27 DIAGNOSIS — M5136 Other intervertebral disc degeneration, lumbar region: Secondary | ICD-10-CM | POA: Diagnosis not present

## 2021-10-02 ENCOUNTER — Ambulatory Visit: Payer: Medicare HMO | Admitting: Obstetrics and Gynecology

## 2021-10-02 ENCOUNTER — Ambulatory Visit: Payer: Medicare HMO | Admitting: Family Medicine

## 2021-10-03 DIAGNOSIS — Z7984 Long term (current) use of oral hypoglycemic drugs: Secondary | ICD-10-CM | POA: Diagnosis not present

## 2021-10-03 DIAGNOSIS — N183 Chronic kidney disease, stage 3 unspecified: Secondary | ICD-10-CM | POA: Diagnosis not present

## 2021-10-03 DIAGNOSIS — M1712 Unilateral primary osteoarthritis, left knee: Secondary | ICD-10-CM | POA: Diagnosis not present

## 2021-10-03 DIAGNOSIS — E1122 Type 2 diabetes mellitus with diabetic chronic kidney disease: Secondary | ICD-10-CM | POA: Diagnosis not present

## 2021-11-02 DIAGNOSIS — E119 Type 2 diabetes mellitus without complications: Secondary | ICD-10-CM | POA: Diagnosis not present

## 2021-11-02 DIAGNOSIS — H52203 Unspecified astigmatism, bilateral: Secondary | ICD-10-CM | POA: Diagnosis not present

## 2021-11-02 DIAGNOSIS — H524 Presbyopia: Secondary | ICD-10-CM | POA: Diagnosis not present

## 2021-11-02 DIAGNOSIS — H5213 Myopia, bilateral: Secondary | ICD-10-CM | POA: Diagnosis not present

## 2021-11-02 DIAGNOSIS — H5203 Hypermetropia, bilateral: Secondary | ICD-10-CM | POA: Diagnosis not present

## 2021-11-02 DIAGNOSIS — H2513 Age-related nuclear cataract, bilateral: Secondary | ICD-10-CM | POA: Diagnosis not present

## 2021-11-02 DIAGNOSIS — H43393 Other vitreous opacities, bilateral: Secondary | ICD-10-CM | POA: Diagnosis not present

## 2021-11-02 DIAGNOSIS — Z0100 Encounter for examination of eyes and vision without abnormal findings: Secondary | ICD-10-CM | POA: Diagnosis not present

## 2021-11-02 DIAGNOSIS — H1189 Other specified disorders of conjunctiva: Secondary | ICD-10-CM | POA: Diagnosis not present

## 2021-11-02 DIAGNOSIS — Z7984 Long term (current) use of oral hypoglycemic drugs: Secondary | ICD-10-CM | POA: Diagnosis not present

## 2021-11-06 ENCOUNTER — Telehealth: Payer: Self-pay | Admitting: Family Medicine

## 2021-11-06 ENCOUNTER — Other Ambulatory Visit: Payer: Self-pay

## 2021-11-06 DIAGNOSIS — E669 Obesity, unspecified: Secondary | ICD-10-CM

## 2021-11-06 MED ORDER — METFORMIN HCL 500 MG PO TABS: ORAL_TABLET | ORAL | 3 refills | Status: DC

## 2021-11-06 NOTE — Telephone Encounter (Signed)
Medication:   metFORMIN (GLUCOPHAGE) 500 MG tablet [[id number]]   Has the patient contacted their pharmacy? No. (If no, request that the patient contact the pharmacy for the refill.) (If yes, when and what did the pharmacy advise?)  Preferred Pharmacy (with phone number or street name):   [facility] [address], [place], [place] [address] P: [phone]  Agent: Please be advised that RX refills may take up to 3 business days. We ask that you follow-up with your pharmacy.

## 2021-11-09 ENCOUNTER — Other Ambulatory Visit: Payer: Self-pay | Admitting: Family Medicine

## 2021-11-09 DIAGNOSIS — Z1231 Encounter for screening mammogram for malignant neoplasm of breast: Secondary | ICD-10-CM

## 2021-11-15 ENCOUNTER — Telehealth: Payer: Self-pay

## 2021-11-15 NOTE — Telephone Encounter (Signed)
PA initiated via Covermymeds; KEY: [id number]. PA approved.   PA Case: [id number], Status: Approved, Coverage Starts on: 03/04/2021 12:00:00 AM, Coverage Ends on: 03/03/2022 12:00:00 AM. Questions? Contact [phone].

## 2021-12-10 ENCOUNTER — Ambulatory Visit
Admission: RE | Admit: 2021-12-10 | Discharge: 2021-12-10 | Disposition: A | Discharge status: Home / Self Care | Payer: Medicare HMO | Source: Ambulatory Visit | Attending: Family Medicine | Admitting: Family Medicine

## 2021-12-10 DIAGNOSIS — R6889 Other general symptoms and signs: Secondary | ICD-10-CM | POA: Diagnosis not present

## 2021-12-10 DIAGNOSIS — Z1231 Encounter for screening mammogram for malignant neoplasm of breast: Secondary | ICD-10-CM

## 2021-12-31 DIAGNOSIS — M519 Unspecified thoracic, thoracolumbar and lumbosacral intervertebral disc disorder: Secondary | ICD-10-CM | POA: Diagnosis not present

## 2021-12-31 DIAGNOSIS — M47816 Spondylosis without myelopathy or radiculopathy, lumbar region: Secondary | ICD-10-CM | POA: Diagnosis not present

## 2021-12-31 DIAGNOSIS — Z791 Long term (current) use of non-steroidal anti-inflammatories (NSAID): Secondary | ICD-10-CM | POA: Diagnosis not present

## 2021-12-31 DIAGNOSIS — E1122 Type 2 diabetes mellitus with diabetic chronic kidney disease: Secondary | ICD-10-CM | POA: Diagnosis not present

## 2021-12-31 DIAGNOSIS — M1990 Unspecified osteoarthritis, unspecified site: Secondary | ICD-10-CM | POA: Diagnosis not present

## 2021-12-31 DIAGNOSIS — G8929 Other chronic pain: Secondary | ICD-10-CM | POA: Diagnosis not present

## 2021-12-31 DIAGNOSIS — M545 Low back pain, unspecified: Secondary | ICD-10-CM | POA: Diagnosis not present

## 2021-12-31 DIAGNOSIS — N189 Chronic kidney disease, unspecified: Secondary | ICD-10-CM | POA: Diagnosis not present

## 2021-12-31 DIAGNOSIS — Z79891 Long term (current) use of opiate analgesic: Secondary | ICD-10-CM | POA: Diagnosis not present

## 2022-01-22 ENCOUNTER — Other Ambulatory Visit: Payer: Self-pay

## 2022-01-23 ENCOUNTER — Encounter: Payer: Self-pay | Admitting: Cardiology

## 2022-01-23 ENCOUNTER — Ambulatory Visit: Payer: Medicare HMO | Attending: Cardiology | Admitting: Cardiology

## 2022-01-23 VITALS — BP 116/72 | HR 92 | Ht 67.0 in | Wt 208.1 lb

## 2022-01-23 DIAGNOSIS — I10 Essential (primary) hypertension: Secondary | ICD-10-CM

## 2022-01-23 DIAGNOSIS — E1169 Type 2 diabetes mellitus with other specified complication: Secondary | ICD-10-CM

## 2022-01-23 DIAGNOSIS — E782 Mixed hyperlipidemia: Secondary | ICD-10-CM

## 2022-01-23 DIAGNOSIS — E669 Obesity, unspecified: Secondary | ICD-10-CM

## 2022-01-23 DIAGNOSIS — R0609 Other forms of dyspnea: Secondary | ICD-10-CM

## 2022-01-23 MED ORDER — METOPROLOL TARTRATE 100 MG PO TABS: 100.0000 mg | ORAL_TABLET | Freq: Once | ORAL | 0 refills | Status: DC

## 2022-01-23 NOTE — Patient Instructions (Addendum)
Medication Instructions:  Your physician recommends that you continue on your current medications as directed. Please refer to the Current Medication list given to you today.   *If you need a refill on your cardiac medications before your next appointment, please call your pharmacy*   Lab Work: Your physician recommends that you return for lab work in: 1 week prior to your CT scan.   [facility] [address] in [place]. They also close daily for lunch for 12-1.  or [place] [address] 2nd floor M-W 8-11:30 and 1-4:30 and Thursday and Friday 8-11:30.   If you have labs (blood work) drawn today and your tests are completely normal, you will receive your results only by: [place] (if you have MyChart) OR A paper copy in the mail If you have any lab test that is abnormal or we need to change your treatment, we will call you to review the results.   Testing/Procedures:   Your cardiac CT will be scheduled at one of the below locations:   [facility] [address] [place], [place] [address] [phone]   At [facility], please arrive at the [facility] Children's Entrance (Entrance C2) of [facility] 30 minutes prior to test start time. You can use the FREE valet parking offered at entrance C (encouraged to control the heart rate for the test)  Proceed to the [facility] Radiology Department (first floor) to check-in and test prep.  All radiology patients and guests should use entrance C2 at [facility], accessed from [facility], even though the hospital's physical address listed is [address].      Please follow these instructions carefully (unless otherwise directed):  On the Night Before the Test: Be sure to Drink plenty of water. Do not consume any caffeinated/decaffeinated beverages or chocolate 12 hours prior to your test. Do not take any antihistamines 12 hours prior to your  test.  On the Day of the Test: Drink plenty of water until 1 hour prior to the test. Do not eat any food 4 hours prior to the test. You may take your regular medications prior to the test.  Take metoprolol (Lopressor) two hours prior to test. FEMALES- please wear underwire-free bra if available, avoid dresses & tight clothing       After the Test: Drink plenty of water. After receiving IV contrast, you may experience a mild flushed feeling. This is normal. On occasion, you may experience a mild rash up to 24 hours after the test. This is not dangerous. If this occurs, you can take Benadryl 25 mg and increase your fluid intake. If you experience trouble breathing, this can be serious. If it is severe call 911 IMMEDIATELY. If it is mild, please call our office. If you take any of these medications: Glipizide/Metformin, Avandament, Glucavance, please do not take 48 hours after completing test unless otherwise instructed.  We will call to schedule your test 2-4 weeks out understanding that some insurance companies will need an authorization prior to the service being performed.   For non-scheduling related questions, please contact the cardiac imaging nurse navigator should you have any questions/concerns: Marchia Bond, Cardiac Imaging Nurse Navigator Gordy Clement, Cardiac Imaging Nurse Navigator [facility] Direct Office Dial: [phone]   For scheduling needs, including cancellations and rescheduling, please call [place], [phone].   Your physician has requested that you have an echocardiogram. Echocardiography is a painless test  that uses sound waves to create images of your heart. It provides your doctor with information about the size and shape of your heart and how well your heart's chambers and valves are working. This procedure takes approximately one hour. There are no restrictions for this procedure. Please do NOT wear cologne, perfume,  aftershave, or lotions (deodorant is allowed). Please arrive 15 minutes prior to your appointment time.  Follow-Up: At [facility], you and your health needs are our priority.  As part of our continuing mission to provide you with exceptional heart care, we have created designated Provider Care Teams.  These Care Teams include your primary Cardiologist (physician) and Advanced Practice Providers (APPs -  Physician Assistants and Nurse Practitioners) who all work together to provide you with the care you need, when you need it.  We recommend signing up for the patient portal called "MyChart".  Sign up information is provided on this After Visit Summary.  MyChart is used to connect with patients for Virtual Visits (Telemedicine).  Patients are able to view lab/test results, encounter notes, upcoming appointments, etc.  Non-urgent messages can be sent to your provider as well.   To learn more about what you can do with MyChart, go to [email or web address].    Your next appointment:   12 month(s)  The format for your next appointment:   In Person  Provider:   Jyl Heinz, MD   Other Instructions Cardiac CT Angiogram A cardiac CT angiogram is a procedure to look at the heart and the area around the heart. It may be done to help find the cause of chest pains or other symptoms of heart disease. During this procedure, a substance called contrast dye is injected into the blood vessels in the area to be checked. A large X-ray machine, called a CT scanner, then takes detailed pictures of the heart and the surrounding area. The procedure is also sometimes called a coronary CT angiogram, coronary artery scanning, or CTA. A cardiac CT angiogram allows the health care provider to see how well blood is flowing to and from the heart. The health care provider will be able to see if there are any problems, such as: Blockage or narrowing of the coronary arteries in the heart. Fluid around the  heart. Signs of weakness or disease in the muscles, valves, and tissues of the heart. Tell a health care provider about: Any allergies you have. This is especially important if you have had a previous allergic reaction to contrast dye. All medicines you are taking, including vitamins, herbs, eye drops, creams, and over-the-counter medicines. Any blood disorders you have. Any surgeries you have had. Any medical conditions you have. Whether you are pregnant or may be pregnant. Any anxiety disorders, chronic pain, or other conditions you have that may increase your stress or prevent you from lying still. What are the risks? Generally, this is a safe procedure. However, problems may occur, including: Bleeding. Infection. Allergic reactions to medicines or dyes. Damage to other structures or organs. Kidney damage from the contrast dye that is used. Increased risk of cancer from radiation exposure. This risk is low. Talk with your health care provider about: The risks and benefits of testing. How you can receive the lowest dose of radiation. What happens before the procedure? Wear comfortable clothing and remove any jewelry, glasses, dentures, and hearing aids. Follow instructions from your health care provider about eating and drinking. This may include: For 12 hours before the procedure -- avoid caffeine. This includes  tea, coffee, soda, energy drinks, and diet pills. Drink plenty of water or other fluids that do not have caffeine in them. Being well hydrated can prevent complications. For 4-6 hours before the procedure -- stop eating and drinking. The contrast dye can cause nausea, but this is less likely if your stomach is empty. Ask your health care provider about changing or stopping your regular medicines. This is especially important if you are taking diabetes medicines, blood thinners, or medicines to treat problems with erections (erectile dysfunction). What happens during the  procedure?  Hair on your chest may need to be removed so that small sticky patches called electrodes can be placed on your chest. These will transmit information that helps to monitor your heart during the procedure. An IV will be inserted into one of your veins. You might be given a medicine to control your heart rate during the procedure. This will help to ensure that good images are obtained. You will be asked to lie on an exam table. This table will slide in and out of the CT machine during the procedure. Contrast dye will be injected into the IV. You might feel warm, or you may get a metallic taste in your mouth. You will be given a medicine called nitroglycerin. This will relax or dilate the arteries in your heart. The table that you are lying on will move into the CT machine tunnel for the scan. The person running the machine will give you instructions while the scans are being done. You may be asked to: Keep your arms above your head. Hold your breath. Stay very still, even if the table is moving. When the scanning is complete, you will be moved out of the machine. The IV will be removed. The procedure may vary among health care providers and hospitals. What can I expect after the procedure? After your procedure, it is common to have: A metallic taste in your mouth from the contrast dye. A feeling of warmth. A headache from the nitroglycerin. Follow these instructions at home: Take over-the-counter and prescription medicines only as told by your health care provider. If you are told, drink enough fluid to keep your urine pale yellow. This will help to flush the contrast dye out of your body. Most people can return to their normal activities right after the procedure. Ask your health care provider what activities are safe for you. It is up to you to get the results of your procedure. Ask your health care provider, or the department that is doing the procedure, when your results will  be ready. Keep all follow-up visits as told by your health care provider. This is important. Contact a health care provider if: You have any symptoms of allergy to the contrast dye. These include: Shortness of breath. Rash or hives. A racing heartbeat. Summary A cardiac CT angiogram is a procedure to look at the heart and the area around the heart. It may be done to help find the cause of chest pains or other symptoms of heart disease. During this procedure, a large X-ray machine, called a CT scanner, takes detailed pictures of the heart and the surrounding area after a contrast dye has been injected into blood vessels in the area. Ask your health care provider about changing or stopping your regular medicines before the procedure. This is especially important if you are taking diabetes medicines, blood thinners, or medicines to treat erectile dysfunction. If you are told, drink enough fluid to keep your urine pale  yellow. This will help to flush the contrast dye out of your body. This information is not intended to replace advice given to you by your health care provider. Make sure you discuss any questions you have with your health care provider. Document Revised: 10/14/2018 Document Reviewed: 10/14/2018 Elsevier Patient Education  [place].

## 2022-01-23 NOTE — Progress Notes (Signed)
Cardiology Office Note:    Date:  01/23/2022   ID:  [name], DOB Jan 23, 1946, MRN [id number]  PCP:  Mosie Lukes, MD  Cardiologist:  Jenean Lindau, MD   Referring MD: Mosie Lukes, MD    ASSESSMENT:    1. Primary hypertension   2. Diabetes mellitus type 2 in obese ([place])   3. Mixed hyperlipidemia   4. Class 2 severe obesity due to excess calories with serious comorbidity in adult, unspecified BMI ([place])   5. DOE (dyspnea on exertion)    PLAN:    In order of problems listed above:  Primary prevention stressed with patient.  Importance of compliance with diet medication stressed and she vocalized understanding. Dyspnea on exertion: This is concerning.  Patient has multiple risk factors for coronary artery disease and I certainly want to evaluate her for obstructive coronary artery disease.  Various modalities were explained.  She prefers CT coronary angiography with FFR and I will set this up for her. Essential hypertension: Blood pressure is stable and diet was emphasized. Mixed dyslipidemia, obesity and diabetes mellitus: Lifestyle modification stressed.  Patient promises to do better. Patient will be seen in follow-up appointment in 6 months or earlier if the patient has any concerns    Medication Adjustments/Labs and Tests Ordered: Current medicines are reviewed at length with the patient today.  Concerns regarding medicines are outlined above.  No orders of the defined types were placed in this encounter.  No orders of the defined types were placed in this encounter.    No chief complaint on file.    History of Present Illness:    Brenda [name] is a 76 y.o. female.  Patient is previously unknown to me.  He has past medical history of essential hypertension, dyslipidemia, diabetes mellitus, obesity and she leads a sedentary lifestyle.  She is seen my partner in the past.  Upon history taking she mentions to me that she leads a sedentary lifestyle and has  dyspnea on exertion.  This appears to have progressed.  No chest pain.  No orthopnea or PND.  She is concerned about this.  At the time of my evaluation, the patient is alert awake oriented and in no distress.  Past Medical History:  Diagnosis Date   Anxiety 10/17/2010   Arthritis 11/25/2011   Carpal tunnel syndrome on right 01/20/2012   Cervical cancer screening 04/26/2013   Menarche at 14 regular  Menopause surgical at 89  G1P1 s/p SVD  Partial hysterectomy for fibroids with bladder tack  [place]  No h/o abnormal pap, last pap prior to hysterectomy  Mgm UTD, no concerns   Colon cancer screening 04/17/2011   Depression    counseling   Diabetes mellitus type 2 in obese ([place]) 01/31/2013   DOE (dyspnea on exertion) 12/26/2013   Evaluated  in Pulmonary clinic/ [facility]/ [place] 01/03/2014   - 01/03/2014  Walked RA  @ mod pace x 2 laps @ 185 ft each stopped due to fatigue> sob, no desat   Evaluate for OSA (obstructive sleep apnea) 02/14/2014   Feeling grief 09/30/2018   GERD (gastroesophageal reflux disease)    History of chicken pox    childhood age 36   History of colon polyps 01/31/2021   Hyperlipidemia    2011   Hypertension    Left hip pain 03/19/2016   Left knee pain 01/05/2019   Lower back injury, initial encounter 04/02/2016   Lumbar disc disease with radiculopathy 01/20/2012  LVH (left ventricular hypertrophy) 01/30/2021   Medicare annual wellness visit, subsequent 12/25/2014   Muscle spasm 12/25/2014   Numerous moles 05/09/2015   Obesity 10/17/2010   Osteoarthritis of left glenohumeral joint 12/05/2015   Osteopenia 03/19/2016   Oxygen desaturation during sleep 05/24/2014   Preventative health care 03/24/2016   Seasonal allergies    Urinary incontinence 08/24/2012    Past Surgical History:  Procedure Laterality Date   BLADDER REPAIR     2002   TOTAL ABDOMINAL HYSTERECTOMY     2002    Current Medications: Current Meds  Medication Sig   acetaminophen  (TYLENOL) 650 MG CR tablet Take 1,300 mg by mouth daily.   ALPRAZolam (XANAX) 0.25 MG tablet Take 0.5-1 tablets (0.125-0.25 mg total) by mouth 2 (two) times daily as needed for anxiety.   aspirin 81 MG tablet Take 81 mg by mouth daily.   atorvastatin (LIPITOR) 80 MG tablet Take 1 tablet (80 mg total) by mouth daily.   benzonatate (TESSALON) 100 MG capsule Take 1 capsule (100 mg total) by mouth 3 (three) times daily as needed.   celecoxib (CELEBREX) 100 MG capsule Take 100 mg by mouth daily.   Cyanocobalamin (VITAMIN B 12 PO) Take 1,000 Units by mouth daily.   cyclobenzaprine (FLEXERIL) 10 MG tablet Take 1 tablet by mouth 2 (two) times daily as needed for muscle spasms.   docusate sodium (COLACE) 100 MG capsule Take 100 mg by mouth 2 (two) times daily.   escitalopram (LEXAPRO) 20 MG tablet Take 2 tablets (40 mg total) by mouth daily.   famotidine (PEPCID) 40 MG tablet Take 1 tablet (40 mg total) by mouth daily.   irbesartan (AVAPRO) 150 MG tablet Take 1 tablet (150 mg total) by mouth daily.   KRILL OIL PO Take 1 capsule by mouth daily.   metFORMIN (GLUCOPHAGE) 500 MG tablet TAKE 1 TABLET ('500MG'$ ) DAILY BEFORE LUNCH.   OVER THE COUNTER MEDICATION Place 1 suppository rectally as needed (Hemorrohoids). Hemorrohoid supporsitories-As directed   Probiotic Product (PROBIOTIC DAILY) CAPS Take 1 capsule by mouth daily.   traMADol (ULTRAM) 50 MG tablet Take 1 tablet by mouth every 6 (six) hours as needed for moderate pain or severe pain.   triamterene-hydrochlorothiazide (MAXZIDE-25) 37.5-25 MG tablet Take 1 tablet by mouth daily.   TURMERIC PO Take 1 capsule by mouth daily.     Allergies:   Meloxicam   Social History   Socioeconomic History   Marital status: Divorced    Spouse name: Not on file   Number of children: Not on file   Years of education: Not on file   Highest education level: Not on file  Occupational History   Not on file  Tobacco Use   Smoking status: Former    Packs/day: 0.20     Years: 15.00    Total pack years: 3.00    Types: Cigarettes    Passive exposure: Past   Smokeless tobacco: Never   Tobacco comments:    quit 10 years ago  Vaping Use   Vaping Use: Never used  Substance and Sexual Activity   Alcohol use: No    Alcohol/week: 0.0 standard drinks of alcohol   Drug use: No   Sexual activity: Never  Other Topics Concern   Not on file  Social History Narrative   Not on file   Social Determinants of Health   Financial Resource Strain: Medium Risk (08/23/2020)   Overall Financial Resource Strain (CARDIA)    Difficulty of Paying Living Expenses:  Somewhat hard  Food Insecurity: Food Insecurity Present (08/23/2020)   Hunger Vital Sign    Worried About Running Out of Food in the Last Year: Often true    Ran Out of Food in the Last Year: Never true  Transportation Needs: No Transportation Needs (08/23/2020)   PRAPARE - Hydrologist (Medical): No    Lack of Transportation (Non-Medical): No  Physical Activity: Inactive (08/23/2020)   Exercise Vital Sign    Days of Exercise per Week: 0 days    Minutes of Exercise per Session: 0 min  Stress: No Stress Concern Present (08/23/2020)   [place]    Feeling of Stress : Not at all  Social Connections: Moderately Isolated (08/23/2020)   Social Connection and Isolation Panel [NHANES]    Frequency of Communication with Friends and Family: More than three times a week    Frequency of Social Gatherings with Friends and Family: More than three times a week    Attends Religious Services: More than 4 times per year    Active Member of [employer] or Organizations: No    Attends Music therapist: Never    Marital Status: Divorced     Family History: The patient's family history includes Breast cancer (age of onset: 67) in her sister; Cancer in her paternal grandfather; Colon cancer (age of onset: 77) in her sister;  Dementia in her mother; Heart disease (age of onset: 56) in her father; Hypertension in her father; Obesity in her daughter.  ROS:   Please see the history of present illness.    All other systems reviewed and are negative.  EKGs/Labs/Other Studies Reviewed:    The following studies were reviewed today: EKG reveals sinus rhythm and nonspecific ST-T changes   Recent Labs: 05/29/2021: ALT 13; BUN 35; Creatinine, Ser 0.92; Hemoglobin 13.8; Platelets 425.0; Potassium 4.3; Sodium 137; TSH 1.90  Recent Lipid Panel    Component Value Date/Time   CHOL 155 05/29/2021 1547   CHOL 186 02/02/2018 1035   TRIG 280.0 (H) 05/29/2021 1547   HDL 44.10 05/29/2021 1547   HDL 47 02/02/2018 1035   CHOLHDL 4 05/29/2021 1547   VLDL 56.0 (H) 05/29/2021 1547   LDLCALC 79 06/05/2020 1549   LDLCALC 121 (H) 02/02/2018 1035   LDLDIRECT 91.0 05/29/2021 1547    Physical Exam:    VS:  BP 116/72   Pulse 92   Ht '5\' 7"'$  (1.702 m)   Wt 208 lb 1.9 oz (94.4 kg)   SpO2 95%   BMI 32.60 kg/m     Wt Readings from Last 3 Encounters:  01/23/22 208 lb 1.9 oz (94.4 kg)  08/28/21 210 lb (95.3 kg)  05/29/21 212 lb 9.6 oz (96.4 kg)     GEN: Patient is in no acute distress HEENT: Normal NECK: No JVD; No carotid bruits LYMPHATICS: No lymphadenopathy CARDIAC: Hear sounds regular, 2/6 systolic murmur at the apex. RESPIRATORY:  Clear to auscultation without rales, wheezing or rhonchi  ABDOMEN: Soft, non-tender, non-distended MUSCULOSKELETAL:  No edema; No deformity  SKIN: Warm and dry NEUROLOGIC:  Alert and oriented x 3 PSYCHIATRIC:  Normal affect   Signed, Jenean Lindau, MD  01/23/2022 3:15 PM    [facility]

## 2022-01-31 ENCOUNTER — Encounter: Payer: Medicare HMO | Admitting: Family Medicine

## 2022-02-05 ENCOUNTER — Encounter: Payer: Self-pay | Admitting: Obstetrics and Gynecology

## 2022-02-05 ENCOUNTER — Ambulatory Visit (INDEPENDENT_AMBULATORY_CARE_PROVIDER_SITE_OTHER): Payer: Medicare HMO | Admitting: Obstetrics and Gynecology

## 2022-02-05 VITALS — Ht 64.0 in | Wt 196.0 lb

## 2022-02-05 DIAGNOSIS — N8110 Cystocele, unspecified: Secondary | ICD-10-CM

## 2022-02-05 DIAGNOSIS — N993 Prolapse of vaginal vault after hysterectomy: Secondary | ICD-10-CM | POA: Diagnosis not present

## 2022-02-05 DIAGNOSIS — N3941 Urge incontinence: Secondary | ICD-10-CM

## 2022-02-05 DIAGNOSIS — N393 Stress incontinence (female) (male): Secondary | ICD-10-CM

## 2022-02-05 DIAGNOSIS — R350 Frequency of micturition: Secondary | ICD-10-CM

## 2022-02-05 DIAGNOSIS — R6889 Other general symptoms and signs: Secondary | ICD-10-CM | POA: Diagnosis not present

## 2022-02-05 LAB — POCT URINALYSIS DIPSTICK
Bilirubin, UA: NEGATIVE
Blood, UA: NEGATIVE
Glucose, UA: NEGATIVE
Ketones, UA: NEGATIVE
Leukocytes, UA: NEGATIVE
Nitrite, UA: NEGATIVE
Protein, UA: NEGATIVE
Spec Grav, UA: 1.025 (ref 1.010–1.025)
Urobilinogen, UA: 0.2 E.U./dL
pH, UA: 5.5 (ref 5.0–8.0)

## 2022-02-05 NOTE — Progress Notes (Signed)
Subjective:   By signing my name below, I, Kellie Simmering, attest that this documentation has been prepared under the direction and in the presence of Mosie Lukes, MD., 02/07/2022.     Patient ID: Brenda [name], female    DOB: [birth year], 76 y.o.   MRN: [id number]  Chief Complaint  Patient presents with   Annual Exam    Annual Exam   HPI Patient is in today for a comprehensive physical exam and follow up on chronic medical concerns. Denies CP/ palp/ SOB/ HA/ congestion/fevers/GI or GU c/o.  Cardiology Patient has an upcoming CT scan of the heart on 02/19/2023. Her cardiologist arranged the CT scan due to fatigue from walking and associated pain in the hips and knees.  Dental UTD on dental care.  Diabetes Mellitus Patient's last HGBA1C was elevated. She currently takes Metformin 500 mg but does not check her blood glucose regularly. She has gained weight since her last visit and is considering weight loss medications. Lab Results  Component Value Date   HGBA1C 6.9 (H) 02/07/2022   Wt Readings from Last 3 Encounters:  02/07/22 211 lb (95.7 kg)  02/05/22 196 lb (88.9 kg)  01/23/22 208 lb 1.9 oz (94.4 kg)   FHx Her 40 year old sister Vickii Chafe passed away on 2022-02-23 from dementia and COVID-27. 71 year old sister Olly had breast cancer. 6 year old sister Opal Sidles has Thyroid disease. 80 year old sister Grayce Sessions has anxiety/depression.  Past Medical History:  Diagnosis Date   Anxiety 10/17/2010   Arthritis 11/25/2011   Carpal tunnel syndrome on right 01/20/2012   Cervical cancer screening 04/26/2013   Menarche at 14 regular  Menopause surgical at 50  G1P1 s/p SVD  Partial hysterectomy for fibroids with bladder tack  Tubal  No h/o abnormal pap, last pap prior to hysterectomy  Mgm UTD, no concerns   Colon cancer screening 04/17/2011   Depression    counseling   Diabetes mellitus type 2 in obese ([place]) 01/31/2013   DOE (dyspnea on exertion) 12/26/2013   Evaluated  in Pulmonary  clinic/ [facility] [place] 01/03/2014   - 01/03/2014  Walked RA  @ mod pace x 2 laps @ 185 ft each stopped due to fatigue> sob, no desat   Evaluate for OSA (obstructive sleep apnea) 02/14/2014   Feeling grief 09/30/2018   GERD (gastroesophageal reflux disease)    History of chicken pox    childhood age 73   History of colon polyps 01/31/2021   Hyperlipidemia    2011   Hypertension    Left hip pain 03/19/2016   Left knee pain 01/05/2019   Lower back injury, initial encounter 04/02/2016   Lumbar disc disease with radiculopathy 01/20/2012   LVH (left ventricular hypertrophy) 01/30/2021   Medicare annual wellness visit, subsequent 12/25/2014   Muscle spasm 12/25/2014   Numerous moles 05/09/2015   Obesity 10/17/2010   Osteoarthritis of left glenohumeral joint 12/05/2015   Osteopenia 03/19/2016   Oxygen desaturation during sleep 05/24/2014   Preventative health care 03/24/2016   Seasonal allergies    Urinary incontinence 08/24/2012   Past Surgical History:  Procedure Laterality Date   BLADDER REPAIR     2002   TOTAL ABDOMINAL HYSTERECTOMY     2002   Family History  Problem Relation Age of Onset   Dementia Mother    Heart disease Father 56   Hypertension Father    Colon cancer Sister 3   Dementia Sister    Breast cancer Sister 52   Thyroid disease Sister  Depression Sister        anxiety   Obesity Daughter    Cancer Paternal Grandfather        bone   Social History   Socioeconomic History   Marital status: Divorced    Spouse name: Not on file   Number of children: Not on file   Years of education: Not on file   Highest education level: Not on file  Occupational History   Not on file  Tobacco Use   Smoking status: Former    Packs/day: 0.20    Years: 15.00    Total pack years: 3.00    Types: Cigarettes    Passive exposure: Past   Smokeless tobacco: Never   Tobacco comments:    quit 10 years ago  Vaping Use   Vaping Use: Never used  Substance and  Sexual Activity   Alcohol use: No    Alcohol/week: 0.0 standard drinks of alcohol   Drug use: No   Sexual activity: Yes  Other Topics Concern   Not on file  Social History Narrative   Not on file   Social Determinants of Health   Financial Resource Strain: Medium Risk (08/23/2020)   Overall Financial Resource Strain (CARDIA)    Difficulty of Paying Living Expenses: Somewhat hard  Food Insecurity: Food Insecurity Present (08/23/2020)   Hunger Vital Sign    Worried About Running Out of Food in the Last Year: Often true    Ran Out of Food in the Last Year: Never true  Transportation Needs: No Transportation Needs (08/23/2020)   PRAPARE - Hydrologist (Medical): No    Lack of Transportation (Non-Medical): No  Physical Activity: Inactive (08/23/2020)   Exercise Vital Sign    Days of Exercise per Week: 0 days    Minutes of Exercise per Session: 0 min  Stress: No Stress Concern Present (08/23/2020)   [place]    Feeling of Stress : Not at all  Social Connections: Moderately Isolated (08/23/2020)   Social Connection and Isolation Panel [NHANES]    Frequency of Communication with Friends and Family: More than three times a week    Frequency of Social Gatherings with Friends and Family: More than three times a week    Attends Religious Services: More than 4 times per year    Active Member of [employer] or Organizations: No    Attends Archivist Meetings: Never    Marital Status: Divorced  Human resources officer Violence: Not At Risk (08/23/2020)   Humiliation, Afraid, Rape, and Kick questionnaire    Fear of Current or Ex-Partner: No    Emotionally Abused: No    Physically Abused: No    Sexually Abused: No   Outpatient Medications Prior to Visit  Medication Sig Dispense Refill   acetaminophen (TYLENOL) 650 MG CR tablet Take 1,300 mg by mouth daily.     ALPRAZolam (XANAX) 0.25 MG tablet Take  0.5-1 tablets (0.125-0.25 mg total) by mouth 2 (two) times daily as needed for anxiety. 20 tablet 1   aspirin 81 MG tablet Take 81 mg by mouth daily.     atorvastatin (LIPITOR) 80 MG tablet Take 1 tablet (80 mg total) by mouth daily. 90 tablet 3   celecoxib (CELEBREX) 100 MG capsule Take 100 mg by mouth daily.     Cyanocobalamin (VITAMIN B 12 PO) Take 1,000 Units by mouth daily.     escitalopram (LEXAPRO) 20 MG tablet Take  2 tablets (40 mg total) by mouth daily. 180 tablet 1   famotidine (PEPCID) 40 MG tablet Take 1 tablet (40 mg total) by mouth daily. 90 tablet 1   irbesartan (AVAPRO) 150 MG tablet Take 1 tablet (150 mg total) by mouth daily. 90 tablet 1   metFORMIN (GLUCOPHAGE) 500 MG tablet TAKE 1 TABLET (500MG) DAILY BEFORE LUNCH. 30 tablet 3   OVER THE COUNTER MEDICATION Place 1 suppository rectally as needed (Hemorrohoids). Hemorrohoid supporsitories-As directed     Probiotic Product (PROBIOTIC DAILY) CAPS Take 1 capsule by mouth daily.     traMADol (ULTRAM) 50 MG tablet Take 1 tablet by mouth every 6 (six) hours as needed for moderate pain or severe pain.     triamterene-hydrochlorothiazide (MAXZIDE-25) 37.5-25 MG tablet Take 1 tablet by mouth daily. 90 tablet 1   TURMERIC PO Take 1 capsule by mouth daily.     metoprolol tartrate (LOPRESSOR) 100 MG tablet Take 1 tablet (100 mg total) by mouth once for 1 dose. Take 2 hours prior to your CT if your heart rate is greater than 55 1 tablet 0   No facility-administered medications prior to visit.   Allergies  Allergen Reactions   Meloxicam Nausea Only and Nausea And Vomiting   Review of Systems  Constitutional:  Negative for chills and fever.  HENT:  Negative for congestion.   Respiratory:  Negative for shortness of breath.   Cardiovascular:  Negative for chest pain and palpitations.  Gastrointestinal:  Negative for abdominal pain, blood in stool, constipation, diarrhea, nausea and vomiting.  Genitourinary:  Negative for dysuria,  frequency, hematuria and urgency.  Skin:           Neurological:  Negative for headaches.      Objective:    Physical Exam Constitutional:      General: She is not in acute distress.    Appearance: Normal appearance. She is normal weight. She is not ill-appearing.  HENT:     Head: Normocephalic and atraumatic.     Right Ear: Tympanic membrane, ear canal and external ear normal.     Left Ear: Tympanic membrane, ear canal and external ear normal.     Nose: Nose normal.     Mouth/Throat:     Mouth: Mucous membranes are moist.     Pharynx: Oropharynx is clear.  Eyes:     General:        Right eye: No discharge.        Left eye: No discharge.     Extraocular Movements: Extraocular movements intact.     Right eye: No nystagmus.     Left eye: No nystagmus.     Pupils: Pupils are equal, round, and reactive to light.  Neck:     Vascular: No carotid bruit.  Cardiovascular:     Rate and Rhythm: Normal rate and regular rhythm.     Pulses: Normal pulses.     Heart sounds: Normal heart sounds. No murmur heard.    No gallop.  Pulmonary:     Effort: Pulmonary effort is normal. No respiratory distress.     Breath sounds: Normal breath sounds. No wheezing or rales.  Abdominal:     General: Bowel sounds are normal.     Palpations: Abdomen is soft.     Tenderness: There is no abdominal tenderness. There is no guarding.  Musculoskeletal:        General: Normal range of motion.     Cervical back: Normal range of motion.  Right lower leg: No edema.     Left lower leg: No edema.     Comments: Muscle strength 5/5 on upper and lower extremities.   Lymphadenopathy:     Cervical: No cervical adenopathy.  Skin:    General: Skin is warm and dry.  Neurological:     Mental Status: She is alert and oriented to person, place, and time.     Sensory: Sensation is intact.     Motor: Motor function is intact.     Coordination: Coordination is intact.     Deep Tendon Reflexes:     Reflex  Scores:      Patellar reflexes are 2+ on the right side and 2+ on the left side. Psychiatric:        Mood and Affect: Mood normal.        Behavior: Behavior normal.        Judgment: Judgment normal.    BP 122/74 (BP Location: Right Arm, Patient Position: Sitting, Cuff Size: Normal)   Pulse 84   Temp 97.9 F (36.6 C) (Oral)   Resp 16   Ht _0  (1.702 m)   Wt 211 lb (95.7 kg)   SpO2 96%   BMI 33.05 kg/m  Wt Readings from Last 3 Encounters:  02/07/22 211 lb (95.7 kg)  02/05/22 196 lb (88.9 kg)  01/23/22 208 lb 1.9 oz (94.4 kg)   Diabetic Foot Exam - Simple   No data filed    Lab Results  Component Value Date   WBC 5.2 02/07/2022   HGB 13.3 02/07/2022   HCT 39.6 02/07/2022   PLT 363.0 02/07/2022   GLUCOSE 105 (H) 02/07/2022   CHOL 134 02/07/2022   TRIG 81.0 02/07/2022   HDL 51.50 02/07/2022   LDLDIRECT 91.0 05/29/2021   LDLCALC 66 02/07/2022   ALT 13 02/07/2022   AST 16 02/07/2022   NA 139 02/07/2022   K 4.0 02/07/2022   CL 106 02/07/2022   CREATININE 1.05 02/07/2022   BUN 31 (H) 02/07/2022   CO2 26 02/07/2022   TSH 1.85 02/07/2022   HGBA1C 6.9 (H) 02/07/2022   MICROALBUR 1.2 05/29/2021   Lab Results  Component Value Date   TSH 1.85 02/07/2022   Lab Results  Component Value Date   WBC 5.2 02/07/2022   HGB 13.3 02/07/2022   HCT 39.6 02/07/2022   MCV 89.8 02/07/2022   PLT 363.0 02/07/2022   Lab Results  Component Value Date   NA 139 02/07/2022   K 4.0 02/07/2022   CO2 26 02/07/2022   GLUCOSE 105 (H) 02/07/2022   BUN 31 (H) 02/07/2022   CREATININE 1.05 02/07/2022   BILITOT 0.3 02/07/2022   ALKPHOS 91 02/07/2022   AST 16 02/07/2022   ALT 13 02/07/2022   PROT 7.4 02/07/2022   ALBUMIN 4.1 02/07/2022   CALCIUM 9.1 02/07/2022   GFR 51.69 (L) 02/07/2022   Lab Results  Component Value Date   CHOL 134 02/07/2022   Lab Results  Component Value Date   HDL 51.50 02/07/2022   Lab Results  Component Value Date   LDLCALC 66 02/07/2022   Lab  Results  Component Value Date   TRIG 81.0 02/07/2022   Lab Results  Component Value Date   CHOLHDL 3 02/07/2022   Lab Results  Component Value Date   HGBA1C 6.9 (H) 02/07/2022      Assessment & Plan:   Colonoscopy: Last completed on 04/22/2016.  Patient is interested in making an appointment for a  colonoscopy in 03/2022.  DEXA: Last completed on 10/02/2020. The BMD measured at Femur Neck Right is 0.867 g/cm2 with a T-score of -1.2. This patient is considered OSTEOPENIC according to [facility]) criteria. L- 4 was excluded due to degenerative changes. Compared with the prior study on, 09/12/2017 the BMD of the total mean shows(no statistically signficant change. The scan quality is good. Repeat in 5 years.  Mammogram: Last completed on 12/10/2021. No mammographic evidence of malignancy.  Repeat in 1 year.  Pap Smear: Last completed on 04/26/2013. Normal results.   Advanced Care Planning Patient has been provided with advanced care planning documents.  Diet/Exercise Patient has been informed about eating healthy foods, hydrating, and meeting a minimum of 4000 daily steps.   Immunizations Patient has been informed about receiving RSV and Tetanus immunizations. Immunization History  Administered Date(s) Administered   Fluad Quad(high Dose 65+) 10/26/2018, 12/19/2021   Influenza Split 01/01/2010, 01/16/2011, 11/25/2011, 01/03/2014   Influenza Whole 12/31/2012   Influenza, High Dose Seasonal PF 11/15/2016, 10/26/2018   Influenza,inj,Quad PF,6+ Mos 12/20/2013, 12/19/2014   Influenza-Unspecified 11/09/2015, 11/25/2017, 11/17/2019, 01/02/2021   PFIZER(Purple Top)SARS-COV-2 Vaccination 04/08/2019, 04/29/2019, 12/12/2019, 07/07/2020   Pfizer Covid-19 Vaccine Bivalent Booster 63yr & up 12/19/2021   Pneumococcal Conjugate-13 01/25/2013   Pneumococcal Polysaccharide-23 12/21/2007, 12/19/2014   Tdap 04/17/2011   Zoster, Unspecified 08/16/2021, 11/21/2021    Medications A glucometer, lancets, and test strips have been prescribed today. Patient will consider weight loss medications.  Labs Routine lab work will be performed today.  Problem List Items Addressed This Visit     Hypertension    Well controlled, no changes to meds. Encouraged heart healthy diet such as the DASH diet and exercise as tolerated.       Relevant Orders   CBC with Differential/Platelet (Completed)   Comprehensive metabolic panel (Completed)   Obesity    Encouraged DASH or MIND diet, decrease po intake and increase exercise as tolerated. Needs 7-8 hours of sleep nightly. Avoid trans fats, eat small, frequent meals every 4-5 hours with lean proteins, complex carbs and healthy fats. Minimize simple carbs, high fat foods and processed foods, can consider a GIP med       Hyperlipidemia    Encourage heart healthy diet such as MIND or DASH diet, increase exercise, avoid trans fats, simple carbohydrates and processed foods, consider a krill or fish or flaxseed oil cap daily. Tolerating Atorvastatin.       Relevant Orders   TSH (Completed)   Lipid panel (Completed)   Diabetes mellitus type 2 in obese ([facility] - Primary    hgba1c acceptable but increasing will add Metformin 500 mg po qam, minimize simple carbs. Increase exercise as tolerated. Continue current meds. Given rx for glucometer, lancets and test strips, check sugars daily and as needed      Relevant Orders   Hemoglobin A1c (Completed)   Osteopenia    Encouraged to get adequate exercise, calcium and vitamin d intake       Preventative health care    Patient encouraged to maintain heart healthy diet, regular exercise, adequate sleep. Consider daily probiotics. Take medications as prescribed. Is switching to an ABee Cavenext year to get her Shingrix shots paid for. Labs ordered and reviewed[facility] MGM done in 2022 repeat next year. Has aged out of Paps and  her last pap was normal      Relevant Orders   CBC with Differential/Platelet (Completed)  Comprehensive metabolic panel (Completed)   Meds ordered this encounter  Medications   blood glucose meter kit and supplies    Sig: Dispense based on patient and insurance preference. Use Prn to check blood suagr    Dispense:  1 each    Refill:  1    Order Specific Question:   Number of strips    Answer:   100    Order Specific Question:   Number of lancets    Answer:   100   I, Penni Homans, MD, personally preformed the services described in this documentation.  All medical record entries made by the scribe were at my direction and in my presence.  I have reviewed the chart and discharge instructions (if applicable) and agree that the record reflects my personal performance and is accurate and complete. 02/07/2022  I,Mohammed Iqbal,acting as a scribe for Penni Homans, MD.,have documented all relevant documentation on the behalf of Penni Homans, MD,as directed by  Penni Homans, MD while in the presence of Penni Homans, MD.  Penni Homans, MD

## 2022-02-05 NOTE — Patient Instructions (Addendum)
Try to urinate every 2-3 hours to help with bladder emptying.   You have a stage 2 (out of 4) prolapse.  We discussed the fact that it is not life threatening but there are several treatment options. For treatment of pelvic organ prolapse, we discussed options for management including expectant management, conservative management, and surgical management, such as Kegels, a pessary, pelvic floor physical therapy, and specific surgical procedures.      URODYNAMICS (UDS) TEST INFORMATION  IMPORTANT: Please try to arrive with a comfortably full bladder!    What is UDS? Urodynamics is a bladder test used to evaluate how your bladder and urethra (tube you urinate out of) work to help find out the cause of your bladder symptoms and evaluate your bladder function in order to make the best treatment plan for you.   What to expect? A nurse will perform the test and will be with you during the entire exam. First we will have to empty your bladder on a special toilet.  After you have emptied your bladder, very small catheters (plastic tubing) will be placed into your bladder and into your vagina (or rectum). These special small catheters measure pressure to help measure your bladder function.  Your bladder will be gently filled with water and you will be asked to cough and strain at several different points during the test.   You will then be asked to empty your bladder in the special toilet with the catheters in place. Most patients can urinate (pee) easily with the catheters in place since the catheters are so small. In total this procedure lasts about 45 minutes to 1 hour.  After your test is completed, you will return (or possibly be seen the same day) to review the results, talk about treatment options and make a plan moving forward.

## 2022-02-05 NOTE — Progress Notes (Signed)
[facility] New Patient Evaluation and Consultation  Referring Provider: Mosie Lukes, MD PCP: Mosie Lukes, MD Date of Service: 02/05/2022  SUBJECTIVE Chief Complaint: New Patient (Initial Visit) Brenda [name] is a 76 y.o. female here for a consult for prolapse.//)  History of Present Illness: Brenda [name] is a 76 y.o. Black or African-American female seen in consultation at the request of Dr. Charlett Blake for evaluation of incontinence.    Review of records from Dr Charlett Blake significant for: Hs some polydipsa and urinary incontinence. DM controlled with metformin- A1c on 05/2021 was 7.8%  Urinary Symptoms: Leaks urine with cough/ sneeze, lifting, going from sitting to standing, with movement to the bathroom, and with urgency. UUI > SUI Leaks a few time(s) per day.  Pad use: 3 liners/ mini-pads per day.   She is bothered by her UI symptoms. Has a history of a sling- unsure if mesh was used. Was done at the same time as the hysterectomy.  Day time voids 2.  Nocturia: 1 times per night to void. Voiding dysfunction: she does not empty her bladder well.  does not use a catheter to empty bladder.  When urinating, she feels a weak stream and the need to urinate multiple times in a row Drinks: 2-3 bottles water per day, sometimes will have coke  UTIs:  0  UTI's in the last year.   Denies history of blood in urine and kidney or bladder stones  Pelvic Organ Prolapse Symptoms:                  She Admits to a feeling of a bulge the vaginal area. It has been present for a few years. Has been worsening.  She Admits to seeing a bulge.  This bulge is bothersome.   Bowel Symptom: Bowel movements: 1 time(s) per day Stool consistency: soft  Straining: no.  Splinting: no.  Incomplete evacuation: no.  She Denies accidental bowel leakage / fecal incontinence Bowel regimen: fiber   Sexual Function Sexually active: yes, occasional  Pain with sex: No  Pelvic Pain Denies pelvic  pain    Past Medical History:  Past Medical History:  Diagnosis Date   Anxiety 10/17/2010   Arthritis 11/25/2011   Carpal tunnel syndrome on right 01/20/2012   Cervical cancer screening 04/26/2013   Menarche at 14 regular  Menopause surgical at 67  G1P1 s/p SVD  Partial hysterectomy for fibroids with bladder tack  Tubal  No h/o abnormal pap, last pap prior to hysterectomy  Mgm UTD, no concerns   Colon cancer screening 04/17/2011   Depression    counseling   Diabetes mellitus type 2 in obese ([place]) 01/31/2013   DOE (dyspnea on exertion) 12/26/2013   Evaluated  in Pulmonary clinic/ [facility]/ [facility] 01/03/2014   - 01/03/2014  Walked RA  @ mod pace x 2 laps @ 185 ft each stopped due to fatigue> sob, no desat   Evaluate for OSA (obstructive sleep apnea) 02/14/2014   Feeling grief 09/30/2018   GERD (gastroesophageal reflux disease)    History of chicken pox    childhood age 64   History of colon polyps 01/31/2021   Hyperlipidemia    2011   Hypertension    Left hip pain 03/19/2016   Left knee pain 01/05/2019   Lower back injury, initial encounter 04/02/2016   Lumbar disc disease with radiculopathy 01/20/2012   LVH (left ventricular hypertrophy) 01/30/2021   Medicare annual wellness visit, subsequent 12/25/2014   Muscle  spasm 12/25/2014   Numerous moles 05/09/2015   Obesity 10/17/2010   Osteoarthritis of left glenohumeral joint 12/05/2015   Osteopenia 03/19/2016   Oxygen desaturation during sleep 05/24/2014   Preventative health care 03/24/2016   Seasonal allergies    Urinary incontinence 08/24/2012     Past Surgical History:   Past Surgical History:  Procedure Laterality Date   BLADDER REPAIR     2002   TOTAL ABDOMINAL HYSTERECTOMY     2002     Past OB/GYN History: OB History  Gravida Para Term Preterm AB Living  '1 1 1        '$ SAB IAB Ectopic Multiple Live Births          1    # Outcome Date GA Lbr Len/2nd Weight Sex Delivery Anes PTL Lv  1 Term       Vag-Spont       S/p hysterectomy   Medications: She has a current medication list which includes the following prescription(s): acetaminophen, alprazolam, aspirin, atorvastatin, celecoxib, cyanocobalamin, escitalopram, famotidine, irbesartan, metformin, OVER THE COUNTER MEDICATION, probiotic daily, tramadol, triamterene-hydrochlorothiazide, turmeric, and metoprolol tartrate.   Allergies: Patient is allergic to meloxicam.   Social History:  Social History   Tobacco Use   Smoking status: Former    Packs/day: 0.20    Years: 15.00    Total pack years: 3.00    Types: Cigarettes    Passive exposure: Past   Smokeless tobacco: Never   Tobacco comments:    quit 10 years ago  Vaping Use   Vaping Use: Never used  Substance Use Topics   Alcohol use: No    Alcohol/week: 0.0 standard drinks of alcohol   Drug use: No    Relationship status: single She is not employed. Regular exercise: No History of abuse: No  Family History:   Family History  Problem Relation Age of Onset   Dementia Mother    Heart disease Father 67   Hypertension Father    Colon cancer Sister 30   Breast cancer Sister 2   Obesity Daughter    Cancer Paternal Grandfather        bone     Review of Systems: Review of Systems  Constitutional:  Negative for fever, malaise/fatigue and weight loss.  Respiratory:  Positive for shortness of breath. Negative for cough and wheezing.   Cardiovascular:  Negative for chest pain, palpitations and leg swelling.  Gastrointestinal:  Negative for abdominal pain and blood in stool.  Genitourinary:  Negative for dysuria.  Musculoskeletal:  Positive for myalgias.  Skin:  Negative for rash.  Neurological:  Negative for dizziness and headaches.  Endo/Heme/Allergies:  Does not bruise/bleed easily.  Psychiatric/Behavioral:  Negative for depression. The patient is nervous/anxious.      OBJECTIVE Physical Exam: Vitals:   02/05/22 1455  Weight: 196 lb (88.9 kg)  Height: 5'  4" (1.626 m)    Physical Exam Constitutional:      General: She is not in acute distress. Pulmonary:     Effort: Pulmonary effort is normal.  Abdominal:     General: There is no distension.     Palpations: Abdomen is soft.     Tenderness: There is no abdominal tenderness. There is no rebound.  Musculoskeletal:        General: No swelling. Normal range of motion.  Skin:    General: Skin is warm and dry.     Findings: No rash.  Neurological:     Mental Status: She is alert  and oriented to person, place, and time.  Psychiatric:        Mood and Affect: Mood normal.        Behavior: Behavior normal.      GU / Detailed Urogynecologic Evaluation:  Pelvic Exam: Normal external female genitalia; Bartholin's and Skene's glands normal in appearance; urethral meatus normal in appearance, no urethral masses or discharge.   CST: negative  s/p hysterectomy: Speculum exam reveals normal vaginal mucosa with  atrophy and normal vaginal cuff.  Adnexa no mass, fullness, tenderness.     Pelvic floor strength II/V  Pelvic floor musculature: Right levator non-tender, Right obturator non-tender, Left levator non-tender, Left obturator non-tender  POP-Q:   POP-Q  0                                            Aa   0                                           Ba  -5                                              C   4                                            Gh  4                                            Pb  8.5                                            tvl   -2.5                                            Ap  -2.5                                            Bp                                                 D      Rectal Exam:  Normal external rectum  Post-Void Residual (PVR) by Bladder Scan: In order to evaluate bladder emptying, we discussed obtaining a postvoid residual and she agreed to this procedure.  Procedure: Urethra prepped with betadine and straight cath  performed. A PVR of 10 ml was obtained by bladder scan.  Laboratory  Results: POC urine: negative  ASSESSMENT AND PLAN Ms. Eble is a 76 y.o. with:  1. Prolapse of anterior vaginal wall   2. Vaginal vault prolapse after hysterectomy   3. Urge incontinence   4. SUI (stress urinary incontinence, female)    Stage II anterior, Stage I posterior, Stage I apical prolapse - For treatment of pelvic organ prolapse, we discussed options for management including expectant management, conservative management, and surgical management, such as Kegels, a pessary, pelvic floor physical therapy, and specific surgical procedures. - She is interested in surgical treatment. We discussed two options for prolapse repair:  1) vaginal repair without mesh - Pros - safer, no mesh complications - Cons - not as strong as mesh repair, higher risk of recurrence  2) laparoscopic repair with mesh - Pros - stronger, better long-term success - Cons - risks of mesh implant (erosion into vagina or bladder, adhering to the rectum, pain) - these risks are lower than with a vaginal mesh but still exist - Handouts provided on options - Has cardiac CT scheduled in Jan for dyspnea on exertion. Discussed need for cardiac clearance prior to surgery and A1c <8.   2. Urge incontinence - She is voiding minimally throughout the day (2x). We discussed timed voiding every 2-3 hours to avoid bladder getting too full.  - Unclear if she has decreased sensation, will have her undergo urodynamic testing to further evaluate.   3. SUI -For treatment of stress urinary incontinence,  non-surgical options include expectant management, weight loss, physical therapy, as well as a pessary.  Surgical options include a midurethral sling, Burch urethropexy, and transurethral injection of a bulking agent. - briefly discussed treatment if she is pursuing surgery for prolapse. Will reassess options after urodynamic testing.   Return for  urodynamics  Jaquita Folds, MD

## 2022-02-06 NOTE — Assessment & Plan Note (Signed)
Encourage heart healthy diet such as MIND or DASH diet, increase exercise, avoid trans fats, simple carbohydrates and processed foods, consider a krill or fish or flaxseed oil cap daily. Tolerating Atorvastatin 

## 2022-02-06 NOTE — Assessment & Plan Note (Signed)
Well controlled, no changes to meds. Encouraged heart healthy diet such as the DASH diet and exercise as tolerated.  °

## 2022-02-06 NOTE — Assessment & Plan Note (Signed)
Patient encouraged to maintain heart healthy diet, regular exercise, adequate sleep. Consider daily probiotics. Take medications as prescribed. Is switching to an [facility] next year to get her Shingrix shots paid for. Labs ordered and reviewed. Colonoscopy in 2018 at [facility] repeat in 2023. MGM done in 2022 repeat next year. Has aged out of Paps and her last pap was normal

## 2022-02-06 NOTE — Assessment & Plan Note (Addendum)
hgba1c acceptable but increasing will add Metformin 500 mg po qam, minimize simple carbs. Increase exercise as tolerated. Continue current meds. Given rx for glucometer, lancets and test strips, check sugars daily and as needed

## 2022-02-07 ENCOUNTER — Ambulatory Visit (INDEPENDENT_AMBULATORY_CARE_PROVIDER_SITE_OTHER): Payer: Medicare HMO | Admitting: Family Medicine

## 2022-02-07 ENCOUNTER — Encounter: Payer: Self-pay | Admitting: Family Medicine

## 2022-02-07 VITALS — BP 122/74 | HR 84 | Temp 97.9°F | Resp 16 | Ht 67.0 in | Wt 211.0 lb

## 2022-02-07 DIAGNOSIS — E1169 Type 2 diabetes mellitus with other specified complication: Secondary | ICD-10-CM

## 2022-02-07 DIAGNOSIS — Z0000 Encounter for general adult medical examination without abnormal findings: Secondary | ICD-10-CM

## 2022-02-07 DIAGNOSIS — I10 Essential (primary) hypertension: Secondary | ICD-10-CM | POA: Diagnosis not present

## 2022-02-07 DIAGNOSIS — M8580 Other specified disorders of bone density and structure, unspecified site: Secondary | ICD-10-CM | POA: Diagnosis not present

## 2022-02-07 DIAGNOSIS — E782 Mixed hyperlipidemia: Secondary | ICD-10-CM

## 2022-02-07 DIAGNOSIS — E669 Obesity, unspecified: Secondary | ICD-10-CM | POA: Diagnosis not present

## 2022-02-07 MED ORDER — BLOOD GLUCOSE METER KIT: PACK | 1 refills | Status: AC

## 2022-02-07 NOTE — Patient Instructions (Addendum)
Brenda [name], Ozempic consider in January  PURE and MIND or DASH  Preventive Care 3 Years and Older, Female Preventive care refers to lifestyle choices and visits with your health care provider that can promote health and wellness. Preventive care visits are also called wellness exams. What can I expect for my preventive care visit? Counseling Your health care provider may ask you questions about your: Medical history, including: Past medical problems. Family medical history. Pregnancy and menstrual history. History of falls. Current health, including: Memory and ability to understand (cognition). Emotional well-being. Home life and relationship well-being. Sexual activity and sexual health. Lifestyle, including: Alcohol, nicotine or tobacco, and drug use. Access to firearms. Diet, exercise, and sleep habits. Work and work Statistician. Sunscreen use. Safety issues such as seatbelt and bike helmet use. Physical exam Your health care provider will check your: Height and weight. These may be used to calculate your BMI (body mass index). BMI is a measurement that tells if you are at a healthy weight. Waist circumference. This measures the distance around your waistline. This measurement also tells if you are at a healthy weight and may help predict your risk of certain diseases, such as type 2 diabetes and high blood pressure. Heart rate and blood pressure. Body temperature. Skin for abnormal spots. What immunizations do I need?  Vaccines are usually given at various ages, according to a schedule. Your health care provider will recommend vaccines for you based on your age, medical history, and lifestyle or other factors, such as travel or where you work. What tests do I need? Screening Your health care provider may recommend screening tests for certain conditions. This may include: Lipid and cholesterol levels. Hepatitis C test. Hepatitis B test. HIV (human immunodeficiency virus)  test. STI (sexually transmitted infection) testing, if you are at risk. Lung cancer screening. Colorectal cancer screening. Diabetes screening. This is done by checking your blood sugar (glucose) after you have not eaten for a while (fasting). Mammogram. Talk with your health care provider about how often you should have regular mammograms. BRCA-related cancer screening. This may be done if you have a family history of breast, ovarian, tubal, or peritoneal cancers. Bone density scan. This is done to screen for osteoporosis. Talk with your health care provider about your test results, treatment options, and if necessary, the need for more tests. Follow these instructions at home: Eating and drinking  Eat a diet that includes fresh fruits and vegetables, whole grains, lean protein, and low-fat dairy products. Limit your intake of foods with high amounts of sugar, saturated fats, and salt. Take vitamin and mineral supplements as recommended by your health care provider. Do not drink alcohol if your health care provider tells you not to drink. If you drink alcohol: Limit how much you have to 0-1 drink a day. Know how much alcohol is in your drink. In the [place] one drink equals one 12 oz bottle of beer (355 mL), one 5 oz glass of wine (148 mL), or one 1 oz glass of hard liquor (44 mL). Lifestyle Brush your teeth every morning and night with fluoride toothpaste. Floss one time each day. Exercise for at least 30 minutes 5 or more days each week. Do not use any products that contain nicotine or tobacco. These products include cigarettes, chewing tobacco, and vaping devices, such as e-cigarettes. If you need help quitting, ask your health care provider. Do not use drugs. If you are sexually active, practice safe sex. Use a condom or other form of  protection in order to prevent STIs. Take aspirin only as told by your health care provider. Make sure that you understand how much to take and what form to  take. Work with your health care provider to find out whether it is safe and beneficial for you to take aspirin daily. Ask your health care provider if you need to take a cholesterol-lowering medicine (statin). Find healthy ways to manage stress, such as: Meditation, yoga, or listening to music. Journaling. Talking to a trusted person. Spending time with friends and family. Minimize exposure to UV radiation to reduce your risk of skin cancer. Safety Always wear your seat belt while driving or riding in a vehicle. Do not drive: If you have been drinking alcohol. Do not ride with someone who has been drinking. When you are tired or distracted. While texting. If you have been using any mind-altering substances or drugs. Wear a helmet and other protective equipment during sports activities. If you have firearms in your house, make sure you follow all gun safety procedures. What's next? Visit your health care provider once a year for an annual wellness visit. Ask your health care provider how often you should have your eyes and teeth checked. Stay up to date on all vaccines. This information is not intended to replace advice given to you by your health care provider. Make sure you discuss any questions you have with your health care provider. Document Revised: 08/16/2020 Document Reviewed: 08/16/2020 Elsevier Patient Education  [place].

## 2022-02-08 LAB — COMPREHENSIVE METABOLIC PANEL
ALT: 13 U/L (ref 0–35)
AST: 16 U/L (ref 0–37)
Albumin: 4.1 g/dL (ref 3.5–5.2)
Alkaline Phosphatase: 91 U/L (ref 39–117)
BUN: 31 mg/dL — ABNORMAL HIGH (ref 6–23)
CO2: 26 mEq/L (ref 19–32)
Calcium: 9.1 mg/dL (ref 8.4–10.5)
Chloride: 106 mEq/L (ref 96–112)
Creatinine, Ser: 1.05 mg/dL (ref 0.40–1.20)
GFR: 51.69 mL/min — ABNORMAL LOW (ref >60.00)
Glucose, Bld: 105 mg/dL — ABNORMAL HIGH (ref 70–99)
Potassium: 4 mEq/L (ref 3.5–5.1)
Sodium: 139 mEq/L (ref 135–145)
Total Bilirubin: 0.3 mg/dL (ref 0.2–1.2)
Total Protein: 7.4 g/dL (ref 6.0–8.3)

## 2022-02-08 LAB — CBC WITH DIFFERENTIAL/PLATELET
Basophils Absolute: 0.1 10*3/uL (ref 0.0–0.1)
Basophils Relative: 1.3 % (ref 0.0–3.0)
Eosinophils Absolute: 0.2 10*3/uL (ref 0.0–0.7)
Eosinophils Relative: 4 % (ref 0.0–5.0)
HCT: 39.6 % (ref 36.0–46.0)
Hemoglobin: 13.3 g/dL (ref 12.0–15.0)
Lymphocytes Relative: 40.5 % (ref 12.0–46.0)
Lymphs Abs: 2.1 10*3/uL (ref 0.7–4.0)
MCHC: 33.5 g/dL (ref 30.0–36.0)
MCV: 89.8 fl (ref 78.0–100.0)
Monocytes Absolute: 0.5 10*3/uL (ref 0.1–1.0)
Monocytes Relative: 9.2 % (ref 3.0–12.0)
Neutro Abs: 2.4 10*3/uL (ref 1.4–7.7)
Neutrophils Relative %: 45 % (ref 43.0–77.0)
Platelets: 363 10*3/uL (ref 150.0–400.0)
RBC: 4.4 Mil/uL (ref 3.87–5.11)
RDW: 15.7 % — ABNORMAL HIGH (ref 11.5–15.5)
WBC: 5.2 10*3/uL (ref 4.0–10.5)

## 2022-02-08 LAB — LIPID PANEL
Cholesterol: 134 mg/dL (ref 0–200)
HDL: 51.5 mg/dL (ref >39.00)
LDL Cholesterol: 66 mg/dL (ref 0–99)
NonHDL: 82.14
Total CHOL/HDL Ratio: 3
Triglycerides: 81 mg/dL (ref 0.0–149.0)
VLDL: 16.2 mg/dL (ref 0.0–40.0)

## 2022-02-08 LAB — TSH: TSH: 1.85 u[IU]/mL (ref 0.35–5.50)

## 2022-02-08 LAB — HEMOGLOBIN A1C: Hgb A1c MFr Bld: 6.9 % — ABNORMAL HIGH (ref 4.6–6.5)

## 2022-02-09 NOTE — Assessment & Plan Note (Signed)
Encouraged DASH or MIND diet, decrease po intake and increase exercise as tolerated. Needs 7-8 hours of sleep nightly. Avoid trans fats, eat small, frequent meals every 4-5 hours with lean proteins, complex carbs and healthy fats. Minimize simple carbs, high fat foods and processed foods, can consider a GIP med

## 2022-02-09 NOTE — Assessment & Plan Note (Signed)
Encouraged to get adequate exercise, calcium and vitamin d intake 

## 2022-02-11 ENCOUNTER — Telehealth: Payer: Self-pay | Admitting: Family Medicine

## 2022-02-11 NOTE — Telephone Encounter (Signed)
Patient notified of labs from 02/07/22

## 2022-02-11 NOTE — Telephone Encounter (Signed)
Patient returned call regarding labs

## 2022-02-12 NOTE — Addendum Note (Signed)
Addended by: Elita Quick on: 02/12/2022 12:22 PM   Modules accepted: Orders

## 2022-02-15 ENCOUNTER — Telehealth (HOSPITAL_COMMUNITY): Payer: Self-pay | Admitting: Emergency Medicine

## 2022-02-15 ENCOUNTER — Telehealth (HOSPITAL_COMMUNITY): Payer: Self-pay | Admitting: *Deleted

## 2022-02-15 NOTE — Telephone Encounter (Signed)
Attempted to call patient regarding upcoming cardiac CT appointment. °Left message on voicemail with name and callback number °Navarro Nine RN Navigator Cardiac Imaging °[facility] °[phone] Office °[phone] Cell ° °

## 2022-02-15 NOTE — Telephone Encounter (Signed)
Reaching out to patient to offer assistance regarding upcoming cardiac imaging study; pt verbalizes understanding of appt date/time, parking situation and where to check in, pre-test NPO status and medications ordered, and verified current allergies; name and call back number provided for further questions should they arise  Sharvil Hoey RN Navigator Cardiac Imaging [facility] [phone] office [phone] cell  Patient to take 100mg metoprolol tartrate two hours prior to her cardiac CT scan. She is aware to arrive at 3:30pm. 

## 2022-02-18 ENCOUNTER — Ambulatory Visit (HOSPITAL_COMMUNITY)
Admission: RE | Admit: 2022-02-18 | Discharge: 2022-02-18 | Disposition: A | Discharge status: Home / Self Care | Payer: Medicare HMO | Source: Ambulatory Visit | Attending: Cardiology | Admitting: Cardiology

## 2022-02-18 DIAGNOSIS — I2510 Atherosclerotic heart disease of native coronary artery without angina pectoris: Secondary | ICD-10-CM | POA: Insufficient documentation

## 2022-02-18 DIAGNOSIS — R6889 Other general symptoms and signs: Secondary | ICD-10-CM | POA: Diagnosis not present

## 2022-02-18 DIAGNOSIS — R0609 Other forms of dyspnea: Secondary | ICD-10-CM | POA: Diagnosis not present

## 2022-02-18 MED ORDER — NITROGLYCERIN 0.4 MG SL SUBL
SUBLINGUAL_TABLET | SUBLINGUAL | Status: AC
  Filled 2022-02-18: qty 2

## 2022-02-18 MED ORDER — NITROGLYCERIN 0.4 MG SL SUBL
0.8000 mg | SUBLINGUAL_TABLET | Freq: Once | SUBLINGUAL | Status: AC
  Administered 2022-02-18: 0.8 mg via SUBLINGUAL

## 2022-02-18 MED ORDER — IOHEXOL 350 MG/ML SOLN
100.0000 mL | Freq: Once | INTRAVENOUS | Status: AC | PRN
  Administered 2022-02-18: 100 mL via INTRAVENOUS

## 2022-02-18 MED ORDER — METOPROLOL TARTRATE 5 MG/5ML IV SOLN
INTRAVENOUS | Status: DC
Start: 2022-02-18 — End: 2022-02-18
  Filled 2022-02-18: qty 10

## 2022-02-20 ENCOUNTER — Telehealth: Payer: Self-pay

## 2022-02-20 NOTE — Telephone Encounter (Signed)
Called pt was advised and follow up appt  Was made

## 2022-02-27 ENCOUNTER — Ambulatory Visit (INDEPENDENT_AMBULATORY_CARE_PROVIDER_SITE_OTHER): Payer: Medicare HMO | Admitting: Obstetrics and Gynecology

## 2022-02-27 ENCOUNTER — Encounter: Payer: Self-pay | Admitting: Obstetrics and Gynecology

## 2022-02-27 VITALS — BP 111/73 | HR 88

## 2022-02-27 DIAGNOSIS — N393 Stress incontinence (female) (male): Secondary | ICD-10-CM

## 2022-02-27 DIAGNOSIS — N3941 Urge incontinence: Secondary | ICD-10-CM

## 2022-02-27 DIAGNOSIS — R6889 Other general symptoms and signs: Secondary | ICD-10-CM | POA: Diagnosis not present

## 2022-02-27 LAB — POCT URINALYSIS DIPSTICK
Bilirubin, UA: NEGATIVE
Blood, UA: NEGATIVE
Glucose, UA: NEGATIVE
Ketones, UA: NEGATIVE
Leukocytes, UA: NEGATIVE
Nitrite, UA: NEGATIVE
Protein, UA: POSITIVE — AB
Spec Grav, UA: 1.025 (ref 1.010–1.025)
Urobilinogen, UA: 0.2 E.U./dL
pH, UA: 7.5 (ref 5.0–8.0)

## 2022-02-27 NOTE — Progress Notes (Addendum)
[facility] Urodynamics Procedure  Referring Physician: Mosie Lukes, MD Date of Procedure: 02/27/2022  Brenda [name] is a 76 y.o. female who presents for urodynamic evaluation. Indication(s) for study: SUI and UUI  Vital Signs: BP 111/73   Pulse 88   Laboratory Results: A catheterized urine specimen revealed:  POC urine: Positive for protein but negative for all other components.    Voiding Diary: Not Done  Procedure Timeout:  The correct patient was verified and the correct procedure was verified. The patient was in the correct position and safety precautions were reviewed based on at the patient's history.  Urodynamic Procedure A 66F dual lumen urodynamics catheter was placed under sterile conditions into the patient's bladder. A 66F catheter was placed into the rectum in order to measure abdominal pressure. EMG patches were placed in the appropriate position.  All connections were confirmed and calibrations/adjusted made. Saline was instilled into the bladder through the dual lumen catheters.  Cough/valsalva pressures were measured periodically during filling.  Patient was allowed to void.  The bladder was then emptied of its residual.  UROFLOW: Patient was unable to void for initial uroflow  CMG: This was performed with sterile water in the sitting position at a fill rate of 20-30 mL/min.    First sensation of fullness was 186 mLs,  First urge was 191 mLs,  Strong urge was 215 mLs and  Capacity was 253 mLs  Stress incontinence was demonstrated while patient was standing  Highest negative barrier CLPP was 146 cmH20 at 215 ml. Lowest positive Barrier CLPP was 135 cmH20 at 215 ml while standing.  Highest negative barrier VLPP 111cm H20 at 156m.   Detrusor function was overactive, with no phasic contractions seen.  The first occurred at 263 mL to 33 cm of water and was associated with leakage.  Compliance:  Normal. End fill detrusor pressure was 6.3cmH20.   Calculated compliance was 40.268mcmH20  UPP: MUCP with barrier reduction was 43 cm of water.    MICTURITION STUDY: Voiding was performed with reduction using scopettes in the sitting position.  Pdet at Qmax was 145.9 cm of water.  Qmax was 38.8 mL/sec.  It was a normal pattern initially, but patient felt there was more in the bladder and she attempted to urinate more. This attempt at further urination caused for small trickling patterns which could be interpreted as an interrupted void.She voided 218 mL and had a residual of 1 mL.  It was a volitional void, sustained detrusor contraction was present and abdominal straining was present, especially during her attempt to void more after the initial void.   EMG: This was performed with patches.  She had voluntary contractions, recruitment with fill was present and urethral sphincter was relaxed with void.Of note, the patch did fall off during her last void so the tracing is not an accurate description during her void, but the study had multiple data points to support findings.   The details of the procedure with the study tracings have been scanned into EPIC.   Urodynamic Impression:  1. Sensation was reduced; capacity was reduced 2. Stress Incontinence was demonstrated at normal pressures; 3. Detrusor Overactivity was demonstrated with leakage. 4. Emptying was normal with a normal PVR, a sustained detrusor contraction present,  abdominal straining present, normal urethral sphincter activity on EMG.  Plan: - The patient will follow up  to discuss the findings and treatment options.

## 2022-02-27 NOTE — Patient Instructions (Signed)

## 2022-03-01 ENCOUNTER — Other Ambulatory Visit: Payer: Self-pay | Admitting: Family Medicine

## 2022-03-01 DIAGNOSIS — E1169 Type 2 diabetes mellitus with other specified complication: Secondary | ICD-10-CM

## 2022-03-06 ENCOUNTER — Telehealth: Payer: Self-pay | Admitting: Family Medicine

## 2022-03-06 ENCOUNTER — Ambulatory Visit (HOSPITAL_BASED_OUTPATIENT_CLINIC_OR_DEPARTMENT_OTHER)
Admission: RE | Admit: 2022-03-06 | Discharge: 2022-03-06 | Disposition: A | Discharge status: Home / Self Care | Payer: Medicare HMO | Source: Ambulatory Visit | Attending: Cardiology | Admitting: Cardiology

## 2022-03-06 DIAGNOSIS — R0609 Other forms of dyspnea: Secondary | ICD-10-CM | POA: Diagnosis not present

## 2022-03-06 LAB — ECHOCARDIOGRAM COMPLETE
AR max vel: 2.01 cm2
AV Area VTI: 2.12 cm2
AV Area mean vel: 2.08 cm2
AV Mean grad: 3 mmHg
AV Peak grad: 5.4 mmHg
Ao pk vel: 1.16 m/s
Area-P 1/2: 1.79 cm2
S' Lateral: 1.7 cm
Single Plane A2C EF: 49.2 %

## 2022-03-06 NOTE — Telephone Encounter (Signed)
Patient brought in form for blyth to fil out Placed in bin up front Patient would like [place] to fax it & also pick up a copy for herself  Fax: [phone]

## 2022-03-07 NOTE — Telephone Encounter (Signed)
Have form in Dr.Blyth in bin to  Sign and fax will have copy with ready

## 2022-03-13 ENCOUNTER — Encounter: Payer: Self-pay | Admitting: Obstetrics and Gynecology

## 2022-03-13 ENCOUNTER — Ambulatory Visit (INDEPENDENT_AMBULATORY_CARE_PROVIDER_SITE_OTHER): Payer: Medicare HMO | Admitting: Obstetrics and Gynecology

## 2022-03-13 VITALS — BP 140/70 | HR 78

## 2022-03-13 DIAGNOSIS — N393 Stress incontinence (female) (male): Secondary | ICD-10-CM | POA: Diagnosis not present

## 2022-03-13 DIAGNOSIS — N993 Prolapse of vaginal vault after hysterectomy: Secondary | ICD-10-CM

## 2022-03-13 DIAGNOSIS — N8110 Cystocele, unspecified: Secondary | ICD-10-CM | POA: Diagnosis not present

## 2022-03-13 DIAGNOSIS — R6889 Other general symptoms and signs: Secondary | ICD-10-CM | POA: Diagnosis not present

## 2022-03-13 DIAGNOSIS — N3281 Overactive bladder: Secondary | ICD-10-CM

## 2022-03-13 NOTE — Patient Instructions (Addendum)
Plan for surgery: Exam under anesthesia, anterior repair, sacrospinous fixation, midurethral sling, cystoscopy  General Surgical Risks: For all procedures, there are risks of bleeding, infection, damage to surrounding organs including but not limited to bowel, bladder, blood vessels, ureters and nerves, and need for further surgery if an injury were to occur. These risks are all low with minimally invasive surgery.   There are risks of numbness and weakness at any body site or buttock/rectal pain.  It is possible that baseline pain can be worsened by surgery, either with or without mesh. If surgery is vaginal, there is also a low risk of possible conversion to laparoscopy or open abdominal incision where indicated. Very rare risks include blood transfusion, blood clot, heart attack, pneumonia, or death.   There is also a risk of short-term postoperative urinary retention with need to use a catheter. About half of patients need to go home from surgery with a catheter, which is then later removed in the office. The risk of long-term need for a catheter is very low. There is also a risk of worsening of overactive bladder.   Sling: The effectiveness of a midurethral vaginal mesh sling is approximately 85%, and thus, there will be times when you may leak urine after surgery, especially if your bladder is full or if you have a strong cough. There is a balance between making the sling tight enough to treat your leakage but not too tight so that you have long-term difficulty emptying your bladder. A mesh sling will not directly treat overactive bladder/urge incontinence and may worsen it.  There is an FDA safety notification on vaginal mesh procedures for prolapse but NOT mesh slings. We have extensive experience and training with mesh placement and we have close postoperative follow up to identify any potential complications from mesh. It is important to realize that this mesh is a permanent implant that cannot  be easily removed. There are rare risks of mesh exposure (2-4%), pain with intercourse (0-7%), and infection (<1%). The risk of mesh exposure if more likely in a woman with risks for poor healing (prior radiation, poorly controlled diabetes, or immunocompromised). The risk of new or worsened chronic pain after mesh implant is more common in women with baseline chronic pain and/or poorly controlled anxiety or depression. Approximately 2-4% of patients will experience longer-term post-operative voiding dysfunction that may require surgical revision of the sling. We also reviewed that postoperatively, her stream may not be as strong as before surgery.   Prolapse (with or without mesh): Risk factors for surgical failure  include things that put pressure on your pelvis and the surgical repair, including obesity, chronic cough, and heavy lifting or straining (including lifting children or adults, straining on the toilet, or lifting heavy objects such as furniture or anything weighing >25 lbs. Risks of recurrence is 20-30% with vaginal native tissue repair and a less than 10% with sacrocolpopexy with mesh.

## 2022-03-13 NOTE — Progress Notes (Unsigned)
[facility] Return Visit  SUBJECTIVE  History of Present Illness: Brenda [name] is a 77 y.o. female seen in follow-up for prolapse and incontinence. She underwent urodynamic testing.   Had recent [facility] on 02/07/22- 6.9%. She also had recent coronary CT.   Has prior urethral sling.   Urodynamic Impression:  1. Sensation was reduced; capacity was reduced 2. Stress Incontinence was demonstrated at normal pressures; 3. Detrusor Overactivity was demonstrated with leakage. 4. Emptying was normal with a normal PVR, a sustained detrusor contraction present,  abdominal straining present, normal urethral sphincter activity on EMG.  Past Medical History: Patient  has a past medical history of Anxiety (10/17/2010), Arthritis (11/25/2011), Carpal tunnel syndrome on right (01/20/2012), Cervical cancer screening (04/26/2013), Colon cancer screening (04/17/2011), Depression, Diabetes mellitus type 2 in obese ([place]) (01/31/2013), DOE (dyspnea on exertion) (12/26/2013), Evaluate for OSA (obstructive sleep apnea) (02/14/2014), Feeling grief (09/30/2018), GERD (gastroesophageal reflux disease), History of chicken pox, History of colon polyps (01/31/2021), Hyperlipidemia, Hypertension, Left hip pain (03/19/2016), Left knee pain (01/05/2019), Lower back injury, initial encounter (04/02/2016), Lumbar disc disease with radiculopathy (01/20/2012), LVH (left ventricular hypertrophy) (01/30/2021), Medicare annual wellness visit, subsequent (12/25/2014), Muscle spasm (12/25/2014), Numerous moles (05/09/2015), Obesity (10/17/2010), Osteoarthritis of left glenohumeral joint (12/05/2015), Osteopenia (03/19/2016), Oxygen desaturation during sleep (05/24/2014), Preventative health care (03/24/2016), Seasonal allergies, and Urinary incontinence (08/24/2012).   Past Surgical History: She  has a past surgical history that includes Total abdominal hysterectomy and Bladder repair.   Medications: She has a current  medication list which includes the following prescription(s): acetaminophen, alprazolam, aspirin, atorvastatin, blood glucose meter kit and supplies, celecoxib, cyanocobalamin, escitalopram, famotidine, irbesartan, metformin, metoprolol tartrate, OVER THE COUNTER MEDICATION, probiotic daily, tramadol, triamterene-hydrochlorothiazide, and turmeric.   Allergies: Patient is allergic to meloxicam.   Social History: Patient  reports that she has quit smoking. Her smoking use included cigarettes. She has a 3.00 pack-year smoking history. She has been exposed to tobacco smoke. She has never used smokeless tobacco. She reports that she does not drink alcohol and does not use drugs.      OBJECTIVE     Physical Exam: Vitals:   03/13/22 1446  BP: (!) 140/70  Pulse: 78   Gen: No apparent distress, A&O x 3.  Detailed Urogynecologic Evaluation:  Deferred. Prior exam showed:  POP-Q (02/06/23)   0                                            Aa   0                                           Ba   -5                                              C    4                                            Gh   4  Pb   8.5                                            tvl    -2.5                                            Ap   -2.5                                            Bp                                                  D      ASSESSMENT AND PLAN    Brenda [name] is a 77 y.o. with:  1. Prolapse of anterior vaginal wall   2. Vaginal vault prolapse after hysterectomy   3. SUI (stress urinary incontinence, female)   4. Overactive bladder    - Discussed results of urodynamic testing which showed both SUI and DO. She is not as bothered by the urgency and prefers not to start medication for this at this time. For the SUI, we discussed the option of another sling vs urethral bulking. Her sling was done over 20 years ago so reasonable to perform another one at the  time of prolapse repair.   Plan for surgery: Exam under anesthesia, anterior repair, sacrospinous fixation, midurethral sling, cystoscopy  - We reviewed the patient's specific anatomic and functional findings, with the assistance of diagrams, and together finalized the above procedure. The planned surgical procedures were discussed along with the surgical risks outlined below, which were also provided on a detailed handout. Additional treatment options including expectant management, conservative management, medical management were discussed where appropriate.  We reviewed the benefits and risks of each treatment option.   General Surgical Risks: For all procedures, there are risks of bleeding, infection, damage to surrounding organs including but not limited to bowel, bladder, blood vessels, ureters and nerves, and need for further surgery if an injury were to occur. These risks are all low with minimally invasive surgery.   There are risks of numbness and weakness at any body site or buttock/rectal pain.  It is possible that baseline pain can be worsened by surgery, either with or without mesh. If surgery is vaginal, there is also a low risk of possible conversion to laparoscopy or open abdominal incision where indicated. Very rare risks include blood transfusion, blood clot, heart attack, pneumonia, or death.   There is also a risk of short-term postoperative urinary retention with need to use a catheter. About half of patients need to go home from surgery with a catheter, which is then later removed in the office. The risk of long-term need for a catheter is very low. There is also a risk of worsening of overactive bladder.   Sling: The effectiveness of a midurethral vaginal mesh sling is approximately 85%, and thus, there will be times when you may leak urine after surgery, especially if your bladder is full  or if you have a strong cough. There is a balance between making the sling tight enough to  treat your leakage but not too tight so that you have long-term difficulty emptying your bladder. A mesh sling will not directly treat overactive bladder/urge incontinence and may worsen it.  There is an FDA safety notification on vaginal mesh procedures for prolapse but NOT mesh slings. We have extensive experience and training with mesh placement and we have close postoperative follow up to identify any potential complications from mesh. It is important to realize that this mesh is a permanent implant that cannot be easily removed. There are rare risks of mesh exposure (2-4%), pain with intercourse (0-7%), and infection (<1%). The risk of mesh exposure if more likely in a woman with risks for poor healing (prior radiation, poorly controlled diabetes, or immunocompromised). The risk of new or worsened chronic pain after mesh implant is more common in women with baseline chronic pain and/or poorly controlled anxiety or depression. Approximately 2-4% of patients will experience longer-term post-operative voiding dysfunction that may require surgical revision of the sling. We also reviewed that postoperatively, her stream may not be as strong as before surgery.   Prolapse (with or without mesh): Risk factors for surgical failure  include things that put pressure on your pelvis and the surgical repair, including obesity, chronic cough, and heavy lifting or straining (including lifting children or adults, straining on the toilet, or lifting heavy objects such as furniture or anything weighing >25 lbs. Risks of recurrence is 20-30% with vaginal native tissue repair and a less than 10% with sacrocolpopexy with mesh.     - For preop Visit:  She is required to have a visit within 30 days of her surgery.   - Medical clearance: required Letter sent to Dr Geraldo Pitter (Cardiology- [place]) requesting risk stratification and medical optimization.  - Anticoagulant use: No - Medicaid Hysterectomy form: n/a - Accepts  blood transfusion: Yes - Expected length of stay: outpatient  Request sent for surgery scheduling.   Jaquita Folds, MD

## 2022-03-14 ENCOUNTER — Telehealth: Payer: Self-pay

## 2022-03-14 ENCOUNTER — Encounter: Payer: Self-pay | Admitting: Obstetrics and Gynecology

## 2022-03-14 NOTE — Telephone Encounter (Signed)
   Pre-operative Risk Assessment    Patient Name: Brenda [name]  DOB: [birth year] MRN: [id number]      Request for Surgical Clearance    Procedure:   pelvic organ prolapse and stress incontinence  Date of Surgery:  Clearance TBD                                 Surgeon:  Dr. Sherlene Shams Surgeon's Group or Practice Name:  [facility] Phone number:  [phone] Fax number:  [phone]   Type of Clearance Requested:   - Medical    Type of Anesthesia:  General    Additional requests/questions:    SignedLowella Grip   03/14/2022, 4:39 PM

## 2022-03-15 NOTE — Telephone Encounter (Signed)
Primary Cardiologist:Rajan Reuel Derby, MD  Seen by Dr. Geraldo Pitter on 01/23/22 with symptom of DOE. Had echocardiogram and CCTA which were unremarkable. Recommend virtual visit to reassess patient's symptoms. Preoperative team, please contact this patient and set up a phone call appointment for further preoperative risk assessment. Please obtain consent and complete medication review. Thank you for your help.   I confirm that guidance regarding antiplatelet and oral anticoagulation therapy has been completed and, if necessary, noted below (none requested).   Emmaline Life, NP-C  03/15/2022, 8:38 AM [address], [address] Office [phone] Fax [phone]

## 2022-03-15 NOTE — Telephone Encounter (Signed)
Left message for the pt to call back to set up tele pre op appt 

## 2022-03-18 ENCOUNTER — Telehealth: Payer: Self-pay

## 2022-03-18 NOTE — Telephone Encounter (Signed)
Patient agreeable with virtual appointment; consent given and med list reviewed.

## 2022-03-18 NOTE — Telephone Encounter (Signed)
  Patient Consent for Virtual Visit         Brenda [name] has provided verbal consent on 03/18/2022 for a virtual visit (video or telephone).   CONSENT FOR VIRTUAL VISIT FOR:  Brenda [name]  By participating in this virtual visit I agree to the following:  I hereby voluntarily request, consent and authorize [place] and its employed or contracted physicians, physician assistants, nurse practitioners or other licensed health care professionals (the Practitioner), to provide me with telemedicine health care services (the "Services") as deemed necessary by the treating Practitioner. I acknowledge and consent to receive the Services by the Practitioner via telemedicine. I understand that the telemedicine visit will involve communicating with the Practitioner through live audiovisual communication technology and the disclosure of certain medical information by electronic transmission. I acknowledge that I have been given the opportunity to request an in-person assessment or other available alternative prior to the telemedicine visit and am voluntarily participating in the telemedicine visit.  I understand that I have the right to withhold or withdraw my consent to the use of telemedicine in the course of my care at any time, without affecting my right to future care or treatment, and that the Practitioner or I may terminate the telemedicine visit at any time. I understand that I have the right to inspect all information obtained and/or recorded in the course of the telemedicine visit and may receive copies of available information for a reasonable fee.  I understand that some of the potential risks of receiving the Services via telemedicine include:  Delay or interruption in medical evaluation due to technological equipment failure or disruption; Information transmitted may not be sufficient (e.g. poor resolution of images) to allow for appropriate medical decision making by the Practitioner;  and/or  In rare instances, security protocols could fail, causing a breach of personal health information.  Furthermore, I acknowledge that it is my responsibility to provide information about my medical history, conditions and care that is complete and accurate to the best of my ability. I acknowledge that Practitioner's advice, recommendations, and/or decision may be based on factors not within their control, such as incomplete or inaccurate data provided by me or distortions of diagnostic images or specimens that may result from electronic transmissions. I understand that the practice of medicine is not an exact science and that Practitioner makes no warranties or guarantees regarding treatment outcomes. I acknowledge that a copy of this consent can be made available to me via my patient portal ([place]), or I can request a printed copy by calling the office of [facility].    I understand that my insurance will be billed for this visit.   I have read or had this consent read to me. I understand the contents of this consent, which adequately explains the benefits and risks of the Services being provided via telemedicine.  I have been provided ample opportunity to ask questions regarding this consent and the Services and have had my questions answered to my satisfaction. I give my informed consent for the services to be provided through the use of telemedicine in my medical care

## 2022-03-25 NOTE — Progress Notes (Signed)
Virtual Visit via Telephone Note   Because of Brenda [name]'s co-morbid illnesses, she is at least at moderate risk for complications without adequate follow up.  This format is felt to be most appropriate for this patient at this time.  The patient did not have access to video technology/had technical difficulties with video requiring transitioning to audio format only (telephone).  All issues noted in this document were discussed and addressed.  No physical exam could be performed with this format.  Please refer to the patient's chart for her consent to telehealth for [facility].  Evaluation Performed:  Preoperative cardiovascular risk assessment _____________   Date:  03/25/2022   Patient ID:  Brenda [name], DOB 1945/03/27, MRN [id number] Patient Location:  Home Provider location:   Office  Primary Care Provider:  Mosie Lukes, MD Primary Cardiologist:  Jenean Lindau, MD  Chief Complaint / Patient Profile   77 y.o. y/o female with a h/o HTN, type 2 diabetes, HLD, obesity, coronary CTA with calcium score of 205 (70th %) with mild nonobstructive CAD, who is pending pelvic organ prolapse and stress incontinence repair and presents today for telephonic preoperative cardiovascular risk assessment.  History of Present Illness    Brenda [name] is a 77 y.o. female who presents via audio/video conferencing for a telehealth visit today.  Pt was last seen in cardiology clinic on 01/23/22 by Dr. Geraldo Pitter.  At that time Brenda [name] was scheduled to undergo echo and CCTA for symptoms of DOE. The tests were unremarkable. The patient is now pending procedure as outlined above. Since her last visit, she denies chest pain, shortness of breath, lower extremity edema, fatigue, palpitations, melena, hematuria, hemoptysis, diaphoresis, weakness, presyncope, syncope, orthopnea, and PND. She is walking outside for exercise 15-20 minutes several days per week as well as remaining active around  her home.   Past Medical History    Past Medical History:  Diagnosis Date   Anxiety 10/17/2010   Arthritis 11/25/2011   Carpal tunnel syndrome on right 01/20/2012   Cervical cancer screening 04/26/2013   Menarche at 14 regular  Menopause surgical at 82  G1P1 s/p SVD  Partial hysterectomy for fibroids with bladder tack  Tubal  No h/o abnormal pap, last pap prior to hysterectomy  Mgm UTD, no concerns   Colon cancer screening 04/17/2011   Depression    counseling   Diabetes mellitus type 2 in obese ([place]) 01/31/2013   DOE (dyspnea on exertion) 12/26/2013   Evaluated  in Pulmonary clinic/ [facility]/ [place] 01/03/2014   - 01/03/2014  Walked RA  @ mod pace x 2 laps @ 185 ft each stopped due to fatigue> sob, no desat   Evaluate for OSA (obstructive sleep apnea) 02/14/2014   Feeling grief 09/30/2018   GERD (gastroesophageal reflux disease)    History of chicken pox    childhood age 54   History of colon polyps 01/31/2021   Hyperlipidemia    2011   Hypertension    Left hip pain 03/19/2016   Left knee pain 01/05/2019   Lower back injury, initial encounter 04/02/2016   Lumbar disc disease with radiculopathy 01/20/2012   LVH (left ventricular hypertrophy) 01/30/2021   Medicare annual wellness visit, subsequent 12/25/2014   Muscle spasm 12/25/2014   Numerous moles 05/09/2015   Obesity 10/17/2010   Osteoarthritis of left glenohumeral joint 12/05/2015   Osteopenia 03/19/2016   Oxygen desaturation during sleep 05/24/2014   Preventative health care 03/24/2016   Seasonal  allergies    Urinary incontinence 08/24/2012   Past Surgical History:  Procedure Laterality Date   BLADDER REPAIR     2002   TOTAL ABDOMINAL HYSTERECTOMY     2002    Allergies  Allergies  Allergen Reactions   Meloxicam Nausea Only and Nausea And Vomiting    Home Medications    Prior to Admission medications   Medication Sig Start Date End Date Taking? Authorizing Provider  acetaminophen (TYLENOL) 650  MG CR tablet Take 1,300 mg by mouth daily.    [provider]  aspirin 81 MG tablet Take 81 mg by mouth daily.    [provider]  atorvastatin (LIPITOR) 80 MG tablet Take 1 tablet (80 mg total) by mouth daily. 08/24/21   Mosie Lukes, MD  blood glucose meter kit and supplies Dispense based on patient and insurance preference. Use Prn to check blood suagr 02/07/22   Mosie Lukes, MD  celecoxib (CELEBREX) 100 MG capsule Take 100 mg by mouth daily. 12/14/21   [provider]  Cyanocobalamin (VITAMIN B 12 PO) Take 1,000 Units by mouth daily.    [provider]  escitalopram (LEXAPRO) 20 MG tablet Take 2 tablets (40 mg total) by mouth daily. 08/24/21   Mosie Lukes, MD  famotidine (PEPCID) 40 MG tablet Take 1 tablet (40 mg total) by mouth daily. 08/24/21   Mosie Lukes, MD  irbesartan (AVAPRO) 150 MG tablet Take 1 tablet (150 mg total) by mouth daily. 08/24/21   Mosie Lukes, MD  metFORMIN (GLUCOPHAGE) 500 MG tablet TAKE ONE TABLET BY MOUTH BEFORE LUNCH 03/01/22   Mosie Lukes, MD  Probiotic Product (PROBIOTIC DAILY) CAPS Take 1 capsule by mouth daily.    [provider]  traMADol (ULTRAM) 50 MG tablet Take 1 tablet by mouth every 6 (six) hours as needed for moderate pain or severe pain. 01/22/22   [provider]  triamterene-hydrochlorothiazide (MAXZIDE-25) 37.5-25 MG tablet Take 1 tablet by mouth daily. 08/24/21   Mosie Lukes, MD  TURMERIC PO Take 1 capsule by mouth daily.    [provider]    Physical Exam    Vital Signs:  Brenda [name] does not have vital signs available for review today.  Given telephonic nature of communication, physical exam is limited. AAOx3. NAD. Normal affect.  Speech and respirations are unlabored.  Accessory Clinical Findings    None  Assessment & Plan    1.  Preoperative Cardiovascular Risk Assessment: The patient is doing well from a cardiac perspective. Therefore, based on ACC/AHA  guidelines, the patient would be at acceptable risk for the planned procedure without further cardiovascular testing. According to the Revised Cardiac Risk Index (RCRI), her Perioperative Risk of Major Cardiac Event is (%): 0.4 Her Functional Capacity in METs is: 6.61 according to the Duke Activity Status Index (DASI).  The patient was advised that if she develops new symptoms prior to surgery to contact our office to arrange for a follow-up visit, and she verbalized understanding.  A copy of this note will be routed to requesting surgeon.  Time:   Today, I have spent 10 minutes with the patient with telehealth technology discussing medical history, symptoms, and management plan.    Emmaline Life, NP-C  04/01/2022, 2:21 PM [address] Office [phone] Fax [phone]

## 2022-04-01 ENCOUNTER — Encounter: Payer: Self-pay | Admitting: Nurse Practitioner

## 2022-04-01 ENCOUNTER — Ambulatory Visit: Payer: Medicare HMO | Attending: Cardiology | Admitting: Nurse Practitioner

## 2022-04-01 DIAGNOSIS — Z01810 Encounter for preprocedural cardiovascular examination: Secondary | ICD-10-CM

## 2022-04-02 NOTE — Progress Notes (Unsigned)
[facility] Pre-Operative H&P  Subjective Chief Complaint: Brenda [name] presents for a preoperative encounter.   History of Present Illness: Brenda [name] is a 77 y.o. female who presents for preoperative visit.  She is scheduled to undergo Exam under anesthesia, anterior repair, sacrospinous fixation, midurethral sling, cystoscopy  on 05/02/2022.  Her symptoms include prolapse of anterior vaginal wall, vaginal vault prolapse after hysterectomy, SUI, UUI, and she was was found to have Stage II anterior, Stage I posterior, Stage I apical prolapse.   Urodynamics showed: 1. Sensation was reduced; capacity was reduced 2. Stress Incontinence was demonstrated at normal pressures; 3. Detrusor Overactivity was demonstrated with leakage. 4. Emptying was normal with a normal PVR, a sustained detrusor contraction present,  abdominal straining present, normal urethral sphincter activity on EMG.  Past Medical History:  Diagnosis Date   Anxiety 10/17/2010   Arthritis 11/25/2011   Carpal tunnel syndrome on right 01/20/2012   Cervical cancer screening 04/26/2013   Menarche at 14 regular  Menopause surgical at 89  G1P1 s/p SVD  Partial hysterectomy for fibroids with bladder tack  Tubal  No h/o abnormal pap, last pap prior to hysterectomy  Mgm UTD, no concerns   Colon cancer screening 04/17/2011   Depression    counseling   Diabetes mellitus type 2 in obese ([place]) 01/31/2013   DOE (dyspnea on exertion) 12/26/2013   Evaluated  in Pulmonary clinic/  [facility]/ [facility] 01/03/2014   - 01/03/2014  Walked RA  @ mod pace x 2 laps @ 185 ft each stopped due to fatigue> sob, no desat   Evaluate for OSA (obstructive sleep apnea) 02/14/2014   Feeling grief 09/30/2018   GERD (gastroesophageal reflux disease)    History of chicken pox    childhood age 3   History of colon polyps 01/31/2021   Hyperlipidemia    2011   Hypertension    Left hip pain 03/19/2016   Left knee pain 01/05/2019   Lower  back injury, initial encounter 04/02/2016   Lumbar disc disease with radiculopathy 01/20/2012   LVH (left ventricular hypertrophy) 01/30/2021   Medicare annual wellness visit, subsequent 12/25/2014   Muscle spasm 12/25/2014   Numerous moles 05/09/2015   Obesity 10/17/2010   Osteoarthritis of left glenohumeral joint 12/05/2015   Osteopenia 03/19/2016   Oxygen desaturation during sleep 05/24/2014   Preventative health care 03/24/2016   Seasonal allergies    Urinary incontinence 08/24/2012     Past Surgical History:  Procedure Laterality Date   BLADDER REPAIR     2002   TOTAL ABDOMINAL HYSTERECTOMY     2002    is allergic to meloxicam.   Family History  Problem Relation Age of Onset   Dementia Mother    Heart disease Father 62   Hypertension Father    Colon cancer Sister 26   Dementia Sister    Breast cancer Sister 38   Thyroid disease Sister    Depression Sister        anxiety   Obesity Daughter    Cancer Paternal Grandfather        bone    Social History   Tobacco Use   Smoking status: Former    Packs/day: 0.20    Years: 15.00    Total pack years: 3.00    Types: Cigarettes    Passive exposure: Past   Smokeless tobacco: Never   Tobacco comments:    quit 10 years ago  Vaping Use   Vaping Use: Never used  Substance Use Topics   Alcohol use: No    Alcohol/week: 0.0 standard drinks of alcohol   Drug use: No     Review of Systems was negative for a full 10 system review except as noted in the History of Present Illness.   Current Outpatient Medications:    acetaminophen (TYLENOL) 650 MG CR tablet, Take 1,300 mg by mouth daily., Disp: , Rfl:    aspirin 81 MG tablet, Take 81 mg by mouth daily., Disp: , Rfl:    atorvastatin (LIPITOR) 80 MG tablet, Take 1 tablet (80 mg total) by mouth daily., Disp: 90 tablet, Rfl: 3   blood glucose meter kit and supplies, Dispense based on patient and insurance preference. Use Prn to check blood suagr, Disp: 1 each, Rfl: 1    celecoxib (CELEBREX) 100 MG capsule, Take 100 mg by mouth daily., Disp: , Rfl:    Cyanocobalamin (VITAMIN B 12 PO), Take 1,000 Units by mouth daily., Disp: , Rfl:    escitalopram (LEXAPRO) 20 MG tablet, Take 2 tablets (40 mg total) by mouth daily., Disp: 180 tablet, Rfl: 1   famotidine (PEPCID) 40 MG tablet, Take 1 tablet (40 mg total) by mouth daily., Disp: 90 tablet, Rfl: 1   irbesartan (AVAPRO) 150 MG tablet, Take 1 tablet (150 mg total) by mouth daily., Disp: 90 tablet, Rfl: 1   metFORMIN (GLUCOPHAGE) 500 MG tablet, TAKE ONE TABLET BY MOUTH BEFORE LUNCH, Disp: 30 tablet, Rfl: 0   Probiotic Product (PROBIOTIC DAILY) CAPS, Take 1 capsule by mouth daily., Disp: , Rfl:    traMADol (ULTRAM) 50 MG tablet, Take 1 tablet by mouth every 6 (six) hours as needed for moderate pain or severe pain., Disp: , Rfl:    triamterene-hydrochlorothiazide (MAXZIDE-25) 37.5-25 MG tablet, Take 1 tablet by mouth daily., Disp: 90 tablet, Rfl: 1   TURMERIC PO, Take 1 capsule by mouth daily., Disp: , Rfl:    Objective There were no vitals filed for this visit.  Gen: NAD CV: S1 S2 RRR Lungs: Clear to auscultation bilaterally Abd: soft, nontender   Previous Pelvic Exam showed: Pelvic Exam: Normal external female genitalia; Bartholin's and Skene's glands normal in appearance; urethral meatus normal in appearance, no urethral masses or discharge.    CST: negative   s/p hysterectomy: Speculum exam reveals normal vaginal mucosa with  atrophy and normal vaginal cuff.  Adnexa no mass, fullness, tenderness.       Pelvic floor strength II/V   Pelvic floor musculature: Right levator non-tender, Right obturator non-tender, Left levator non-tender, Left obturator non-tender   POP-Q:    POP-Q   0                                            Aa   0                                           Ba   -5                                              C    4  Gh   4                                             Pb   8.5                                            tvl    -2.5                                            Ap   -2.5                                            Bp                                                  D          Rectal Exam:  Normal external rectum    Assessment/ Plan  Assessment: The patient is a 77 y.o. year old scheduled to undergo Exam under anesthesia, anterior repair, sacrospinous fixation, midurethral sling, cystoscopy. Written consent was obtained for these procedures.  Plan: General Surgical Consent: The patient has previously been counseled on alternative treatments, and the decision by the patient and provider was to proceed with the procedure listed above.  For all procedures, there are risks of bleeding, infection, damage to surrounding organs including but not limited to bowel, bladder, blood vessels, ureters and nerves, and need for further surgery if an injury were to occur. These risks are all low with minimally invasive surgery.   There are risks of numbness and weakness at any body site or buttock/rectal pain.  It is possible that baseline pain can be worsened by surgery, either with or without mesh. If surgery is vaginal, there is also a low risk of possible conversion to laparoscopy or open abdominal incision where indicated. Very rare risks include blood transfusion, blood clot, heart attack, pneumonia, or death.   There is also a risk of short-term postoperative urinary retention with need to use a catheter. About half of patients need to go home from surgery with a catheter, which is then later removed in the office. The risk of long-term need for a catheter is very low. There is also a risk of worsening of overactive bladder.   Sling: The effectiveness of a midurethral vaginal mesh sling is approximately 85%, and thus, there will be times when you may leak urine after surgery, especially if your bladder is full or if  you have a strong cough. There is a balance between making the sling tight enough to treat your leakage but not too tight so that you have long-term difficulty emptying your bladder. A mesh sling will not directly treat overactive bladder/urge incontinence and may worsen it.  There is an FDA safety notification on vaginal mesh procedures for prolapse but NOT mesh slings. We have extensive experience and training with  mesh placement and we have close postoperative follow up to identify any potential complications from mesh. It is important to realize that this mesh is a permanent implant that cannot be easily removed. There are rare risks of mesh exposure (2-4%), pain with intercourse (0-7%), and infection (<1%). The risk of mesh exposure if more likely in a woman with risks for poor healing (prior radiation, poorly controlled diabetes, or immunocompromised). The risk of new or worsened chronic pain after mesh implant is more common in women with baseline chronic pain and/or poorly controlled anxiety or depression. Approximately 2-4% of patients will experience longer-term post-operative voiding dysfunction that may require surgical revision of the sling. We also reviewed that postoperatively, her stream may not be as strong as before surgery.   Prolapse (with or without mesh): Risk factors for surgical failure  include things that put pressure on your pelvis and the surgical repair, including obesity, chronic cough, and heavy lifting or straining (including lifting children or adults, straining on the toilet, or lifting heavy objects such as furniture or anything weighing >25 lbs. Risks of recurrence is 20-30% with vaginal native tissue repair and a less than 10% with sacrocolpopexy with mesh.    We discussed consent for blood products. Risks for blood transfusion include allergic reactions, other reactions that can affect different body organs and managed accordingly, transmission of infectious diseases such  as HIV or Hepatitis. However, the blood is screened. Patient consents for blood products.***  Pre-operative instructions:  She was instructed to not take Aspirin/NSAIDs x 7days prior to surgery. She may continue her '81mg'$  ASA.*** Antibiotic prophylaxis was ordered as indicated.  Catheter use: Patient will go home with foley if needed after post-operative voiding trial.  Post-operative instructions:  She was provided with specific post-operative instructions, including precautions and signs/symptoms for which we would recommend contacting us, in addition to daytime and after-hours contact phone numbers. This was provided on a handout.   Post-operative medications: ***Prescriptions for motrin, tylenol, miralax, and oxycodone were sent to her pharmacy. Discussed using ibuprofen and tylenol on a schedule to limit use of narcotics.   Laboratory testing:  We will check labs: ***. Day of surgery UPT***  Preoperative clearance:  She {does/does not:19886} require surgical clearance.    Post-operative follow-up:  A post-operative appointment will be made for 6 weeks from the date of surgery. If she needs a post-operative nurse visit for a voiding trial, that will be set up after she leaves the hospital.    Patient will call the clinic or use MyChart should anything change or any new issues arise.   Berton Mount, NP   ***OR orders

## 2022-04-04 ENCOUNTER — Ambulatory Visit (INDEPENDENT_AMBULATORY_CARE_PROVIDER_SITE_OTHER): Payer: Medicare HMO | Admitting: Obstetrics and Gynecology

## 2022-04-04 ENCOUNTER — Encounter: Payer: Self-pay | Admitting: Obstetrics and Gynecology

## 2022-04-04 VITALS — BP 121/64 | HR 78 | Wt 210.0 lb

## 2022-04-04 DIAGNOSIS — N993 Prolapse of vaginal vault after hysterectomy: Secondary | ICD-10-CM | POA: Diagnosis not present

## 2022-04-04 DIAGNOSIS — Z01818 Encounter for other preprocedural examination: Secondary | ICD-10-CM

## 2022-04-04 DIAGNOSIS — N393 Stress incontinence (female) (male): Secondary | ICD-10-CM

## 2022-04-04 DIAGNOSIS — N3281 Overactive bladder: Secondary | ICD-10-CM | POA: Diagnosis not present

## 2022-04-04 DIAGNOSIS — R6889 Other general symptoms and signs: Secondary | ICD-10-CM | POA: Diagnosis not present

## 2022-04-04 MED ORDER — ACETAMINOPHEN 500 MG PO TABS: 500.0000 mg | ORAL_TABLET | Freq: Four times a day (QID) | ORAL | 0 refills | Status: AC | PRN

## 2022-04-04 MED ORDER — IBUPROFEN 600 MG PO TABS: 600.0000 mg | ORAL_TABLET | Freq: Four times a day (QID) | ORAL | 0 refills | Status: DC | PRN

## 2022-04-04 MED ORDER — POLYETHYLENE GLYCOL 3350 17 GM/SCOOP PO POWD: 17.0000 g | Freq: Every day | ORAL | 0 refills | Status: DC

## 2022-04-04 MED ORDER — GABAPENTIN 100 MG PO CAPS: 100.0000 mg | ORAL_CAPSULE | Freq: Three times a day (TID) | ORAL | 0 refills | Status: DC

## 2022-04-04 NOTE — H&P (Signed)
[facility] H&P  Subjective Chief Complaint: Brenda [name] presents for a preoperative encounter.   History of Present Illness: Brenda [name] is a 77 y.o. female who presents for preoperative visit.  She is scheduled to undergo Exam under anesthesia, anterior repair, sacrospinous fixation, midurethral sling, cystoscopy  on 05/02/2022.  Her symptoms include prolapse of anterior vaginal wall, vaginal vault prolapse after hysterectomy, SUI, UUI, and she was was found to have Stage II anterior, Stage I posterior, Stage I apical prolapse.   Urodynamics showed: 1. Sensation was reduced; capacity was reduced 2. Stress Incontinence was demonstrated at normal pressures; 3. Detrusor Overactivity was demonstrated with leakage. 4. Emptying was normal with a normal PVR, a sustained detrusor contraction present,  abdominal straining present, normal urethral sphincter activity on EMG.  Past Medical History:  Diagnosis Date   Anxiety 10/17/2010   Arthritis 11/25/2011   Carpal tunnel syndrome on right 01/20/2012   Cervical cancer screening 04/26/2013   Menarche at 14 regular  Menopause surgical at 69  G1P1 s/p SVD  Partial hysterectomy for fibroids with bladder tack  Tubal  No h/o abnormal pap, last pap prior to hysterectomy  Mgm UTD, no concerns   Colon cancer screening 04/17/2011   Depression    counseling   Diabetes mellitus type 2 in obese ([place]) 01/31/2013   DOE (dyspnea on exertion) 12/26/2013   Evaluated  in Pulmonary clinic/ [facility] 01/03/2014   - 01/03/2014  Walked RA  @ mod pace x 2 laps @ 185 ft each stopped due to fatigue> sob, no desat   Evaluate for OSA (obstructive sleep apnea) 02/14/2014   Feeling grief 09/30/2018   GERD (gastroesophageal reflux disease)    History of chicken pox    childhood age 63   History of colon polyps 01/31/2021   Hyperlipidemia    2011   Hypertension    Left hip pain 03/19/2016   Left knee pain 01/05/2019   Lower back injury,  initial encounter 04/02/2016   Lumbar disc disease with radiculopathy 01/20/2012   LVH (left ventricular hypertrophy) 01/30/2021   Medicare annual wellness visit, subsequent 12/25/2014   Muscle spasm 12/25/2014   Numerous moles 05/09/2015   Obesity 10/17/2010   Osteoarthritis of left glenohumeral joint 12/05/2015   Osteopenia 03/19/2016   Oxygen desaturation during sleep 05/24/2014   Preventative health care 03/24/2016   Seasonal allergies    Urinary incontinence 08/24/2012     Past Surgical History:  Procedure Laterality Date   BLADDER REPAIR     2002   TOTAL ABDOMINAL HYSTERECTOMY     2002    is allergic to meloxicam.   Family History  Problem Relation Age of Onset   Dementia Mother    Heart disease Father 31   Hypertension Father    Colon cancer Sister 53   Dementia Sister    Breast cancer Sister 19   Thyroid disease Sister    Depression Sister        anxiety   Obesity Daughter    Cancer Paternal Grandfather        bone    Social History   Tobacco Use   Smoking status: Former    Packs/day: 0.20    Years: 15.00    Total pack years: 3.00    Types: Cigarettes    Passive exposure: Past   Smokeless tobacco: Never   Tobacco comments:    quit 10 years ago  Vaping Use   Vaping Use: Never used  Substance  Use Topics   Alcohol use: No    Alcohol/week: 0.0 standard drinks of alcohol   Drug use: No     Review of Systems was negative for a full 10 system review except as noted in the History of Present Illness.  No current facility-administered medications for this encounter.  Current Outpatient Medications:    acetaminophen (TYLENOL) 500 MG tablet, Take 1 tablet (500 mg total) by mouth every 6 (six) hours as needed (pain)., Disp: 30 tablet, Rfl: 0   acetaminophen (TYLENOL) 650 MG CR tablet, Take 1,300 mg by mouth daily., Disp: , Rfl:    aspirin 81 MG tablet, Take 81 mg by mouth daily., Disp: , Rfl:    atorvastatin (LIPITOR) 80 MG tablet, Take 1 tablet (80  mg total) by mouth daily., Disp: 90 tablet, Rfl: 3   blood glucose meter kit and supplies, Dispense based on patient and insurance preference. Use Prn to check blood suagr, Disp: 1 each, Rfl: 1   celecoxib (CELEBREX) 100 MG capsule, Take 100 mg by mouth daily., Disp: , Rfl:    Cyanocobalamin (VITAMIN B 12 PO), Take 1,000 Units by mouth daily., Disp: , Rfl:    escitalopram (LEXAPRO) 20 MG tablet, Take 2 tablets (40 mg total) by mouth daily., Disp: 180 tablet, Rfl: 1   famotidine (PEPCID) 40 MG tablet, Take 1 tablet (40 mg total) by mouth daily., Disp: 90 tablet, Rfl: 1   gabapentin (NEURONTIN) 100 MG capsule, Take 1 capsule (100 mg total) by mouth 3 (three) times daily., Disp: 30 capsule, Rfl: 0   ibuprofen (ADVIL) 600 MG tablet, Take 1 tablet (600 mg total) by mouth every 6 (six) hours as needed., Disp: 30 tablet, Rfl: 0   irbesartan (AVAPRO) 150 MG tablet, Take 1 tablet (150 mg total) by mouth daily., Disp: 90 tablet, Rfl: 1   metFORMIN (GLUCOPHAGE) 500 MG tablet, TAKE ONE TABLET BY MOUTH BEFORE LUNCH, Disp: 30 tablet, Rfl: 0   polyethylene glycol powder (GLYCOLAX/MIRALAX) 17 GM/SCOOP powder, Take 17 g by mouth daily. Drink 17g (1 scoop) dissolved in water per day., Disp: 255 g, Rfl: 0   Probiotic Product (PROBIOTIC DAILY) CAPS, Take 1 capsule by mouth daily., Disp: , Rfl:    traMADol (ULTRAM) 50 MG tablet, Take 1 tablet by mouth every 6 (six) hours as needed for moderate pain or severe pain., Disp: , Rfl:    triamterene-hydrochlorothiazide (MAXZIDE-25) 37.5-25 MG tablet, Take 1 tablet by mouth daily., Disp: 90 tablet, Rfl: 1   TURMERIC PO, Take 1 capsule by mouth daily., Disp: , Rfl:    Objective There were no vitals filed for this visit.  Gen: NAD CV: S1 S2 RRR Lungs: Clear to auscultation bilaterally Abd: soft, nontender   Previous Pelvic Exam showed: Pelvic Exam: Normal external female genitalia; Bartholin's and Skene's glands normal in appearance; urethral meatus normal in appearance,  no urethral masses or discharge.    CST: negative   s/p hysterectomy: Speculum exam reveals normal vaginal mucosa with  atrophy and normal vaginal cuff.  Adnexa no mass, fullness, tenderness.       Pelvic floor strength II/V   Pelvic floor musculature: Right levator non-tender, Right obturator non-tender, Left levator non-tender, Left obturator non-tender   POP-Q:    POP-Q   0  Aa   0                                           Ba   -5                                              C    4                                            Gh   4                                            Pb   8.5                                            tvl    -2.5                                            Ap   -2.5                                            Bp                                                  D          Rectal Exam:  Normal external rectum    Assessment/ Plan  Assessment: The patient is a 77 y.o. year old scheduled to undergo Exam under anesthesia, anterior repair, sacrospinous fixation, midurethral sling, cystoscopy. Verbal consent was obtained for these procedures.   Berton Mount, NP

## 2022-04-04 NOTE — Patient Instructions (Signed)
Pick up medications from pharmacy prior to surgery; do not take them until after surgery.   Please feel free to call or send a MyChart message if you have questions or concerns.

## 2022-04-05 ENCOUNTER — Telehealth: Payer: Self-pay | Admitting: Pharmacist

## 2022-04-05 NOTE — Telephone Encounter (Signed)
Patient appearing on report for [employer] [facility] - Hypertension Control report due to documented ambulatory blood pressure of 140/70 on 03/13/2022. Next appointment with PCP is 04/08/2022.  Noted that patient is getting most medication from [facility] order for 90 days but metformin for only 30 days at [facility]   Also noted that she had discussion with PCP about Mounjaro or GLP1 in December 2023.  If cost is an issue we could screen for LIS or patient assistance program for 2024 if Dr Charlett Blake still feels that these agents would be appropriate for Mrs. [place].   Current antihypertensives:  irbesartan '150mg'$  daily - refill dates - 08/28/2021 for 90 days, 11/13/2021 for 90 days and 01/21/22 for 90 days (based on this refill history should have enough irbesartan on hand to last until mid March 2024)  triamterene-hydrochlorothiazide 37.5-'25mg'$  daily - 08/28/2021 for 90 days, 11/13/2021 for 90 days and 01/21/22 for 90 days (based on this refill history should have enough triamterene-hydrochlorothiazide on hand to last until mid March 2024)  First attempted outreach to patient to discuss hypertension control and medication management was unsuccessful. Left VM with my CB# [phone] or [phone]  Cherre Robins, PharmD Clinical Pharmacist [facility]

## 2022-04-07 NOTE — Assessment & Plan Note (Signed)
Well controlled, no changes to meds. Encouraged heart healthy diet such as the DASH diet and exercise as tolerated.  

## 2022-04-07 NOTE — Assessment & Plan Note (Signed)
[id number] acceptable but increasing will add Metformin 500 mg po qam, minimize simple carbs. Increase exercise as tolerated. Continue current meds. Given rx for glucometer, lancets and test strips, check sugars daily and as needed

## 2022-04-07 NOTE — Assessment & Plan Note (Signed)
Encourage heart healthy diet such as MIND or DASH diet, increase exercise, avoid trans fats, simple carbohydrates and processed foods, consider a krill or fish or flaxseed oil cap daily. Tolerating Atorvastatin 

## 2022-04-07 NOTE — Assessment & Plan Note (Signed)
Hydrate and monitor 

## 2022-04-07 NOTE — Assessment & Plan Note (Signed)
Encouraged to get adequate exercise, calcium and vitamin d intake 

## 2022-04-08 ENCOUNTER — Ambulatory Visit (INDEPENDENT_AMBULATORY_CARE_PROVIDER_SITE_OTHER): Payer: Medicare HMO | Admitting: Family Medicine

## 2022-04-08 VITALS — BP 118/74 | HR 93 | Temp 97.5°F | Resp 16 | Ht 67.0 in | Wt 215.4 lb

## 2022-04-08 DIAGNOSIS — E1169 Type 2 diabetes mellitus with other specified complication: Secondary | ICD-10-CM | POA: Diagnosis not present

## 2022-04-08 DIAGNOSIS — M8580 Other specified disorders of bone density and structure, unspecified site: Secondary | ICD-10-CM | POA: Diagnosis not present

## 2022-04-08 DIAGNOSIS — I10 Essential (primary) hypertension: Secondary | ICD-10-CM

## 2022-04-08 DIAGNOSIS — E782 Mixed hyperlipidemia: Secondary | ICD-10-CM | POA: Diagnosis not present

## 2022-04-08 DIAGNOSIS — E669 Obesity, unspecified: Secondary | ICD-10-CM

## 2022-04-08 DIAGNOSIS — M62838 Other muscle spasm: Secondary | ICD-10-CM

## 2022-04-08 NOTE — Patient Instructions (Signed)
RSV, Respiratory Syncital Virus vaccine, Arexvy  Tetanus booster any time 2 weeks or more after Arexvy  Hypertension, Adult High blood pressure (hypertension) is when the force of blood pumping through the arteries is too strong. The arteries are the blood vessels that carry blood from the heart throughout the body. Hypertension forces the heart to work harder to pump blood and may cause arteries to become narrow or stiff. Untreated or uncontrolled hypertension can lead to a heart attack, heart failure, a stroke, kidney disease, and other problems. A blood pressure reading consists of a higher number over a lower number. Ideally, your blood pressure should be below 120/80. The first ("top") number is called the systolic pressure. It is a measure of the pressure in your arteries as your heart beats. The second ("bottom") number is called the diastolic pressure. It is a measure of the pressure in your arteries as the heart relaxes. What are the causes? The exact cause of this condition is not known. There are some conditions that result in high blood pressure. What increases the risk? Certain factors may make you more likely to develop high blood pressure. Some of these risk factors are under your control, including: Smoking. Not getting enough exercise or physical activity. Being overweight. Having too much fat, sugar, calories, or salt (sodium) in your diet. Drinking too much alcohol. Other risk factors include: Having a personal history of heart disease, diabetes, high cholesterol, or kidney disease. Stress. Having a family history of high blood pressure and high cholesterol. Having obstructive sleep apnea. Age. The risk increases with age. What are the signs or symptoms? High blood pressure may not cause symptoms. Very high blood pressure (hypertensive crisis) may cause: Headache. Fast or irregular heartbeats (palpitations). Shortness of breath. Nosebleed. Nausea and vomiting. Vision  changes. Severe chest pain, dizziness, and seizures. How is this diagnosed? This condition is diagnosed by measuring your blood pressure while you are seated, with your arm resting on a flat surface, your legs uncrossed, and your feet flat on the floor. The cuff of the blood pressure monitor will be placed directly against the skin of your upper arm at the level of your heart. Blood pressure should be measured at least twice using the same arm. Certain conditions can cause a difference in blood pressure between your right and left arms. If you have a high blood pressure reading during one visit or you have normal blood pressure with other risk factors, you may be asked to: Return on a different day to have your blood pressure checked again. Monitor your blood pressure at home for 1 week or longer. If you are diagnosed with hypertension, you may have other blood or imaging tests to help your health care provider understand your overall risk for other conditions. How is this treated? This condition is treated by making healthy lifestyle changes, such as eating healthy foods, exercising more, and reducing your alcohol intake. You may be referred for counseling on a healthy diet and physical activity. Your health care provider may prescribe medicine if lifestyle changes are not enough to get your blood pressure under control and if: Your systolic blood pressure is above 130. Your diastolic blood pressure is above 80. Your personal target blood pressure may vary depending on your medical conditions, your age, and other factors. Follow these instructions at home: Eating and drinking  Eat a diet that is high in fiber and potassium, and low in sodium, added sugar, and fat. An example of this eating plan  is called the DASH diet. DASH stands for Dietary Approaches to Stop Hypertension. To eat this way: Eat plenty of fresh fruits and vegetables. Try to fill one half of your plate at each meal with fruits and  vegetables. Eat whole grains, such as whole-wheat pasta, brown rice, or whole-grain bread. Fill about one fourth of your plate with whole grains. Eat or drink low-fat dairy products, such as skim milk or low-fat yogurt. Avoid fatty cuts of meat, processed or cured meats, and poultry with skin. Fill about one fourth of your plate with lean proteins, such as fish, chicken without skin, beans, eggs, or tofu. Avoid pre-made and processed foods. These tend to be higher in sodium, added sugar, and fat. Reduce your daily sodium intake. Many people with hypertension should eat less than 1,500 mg of sodium a day. Do not drink alcohol if: Your health care provider tells you not to drink. You are pregnant, may be pregnant, or are planning to become pregnant. If you drink alcohol: Limit how much you have to: 0-1 drink a day for women. 0-2 drinks a day for men. Know how much alcohol is in your drink. In the [place] one drink equals one 12 oz bottle of beer (355 mL), one 5 oz glass of wine (148 mL), or one 1 oz glass of hard liquor (44 mL). Lifestyle  Work with your health care provider to maintain a healthy body weight or to lose weight. Ask what an ideal weight is for you. Get at least 30 minutes of exercise that causes your heart to beat faster (aerobic exercise) most days of the week. Activities may include walking, swimming, or biking. Include exercise to strengthen your muscles (resistance exercise), such as Pilates or lifting weights, as part of your weekly exercise routine. Try to do these types of exercises for 30 minutes at least 3 days a week. Do not use any products that contain nicotine or tobacco. These products include cigarettes, chewing tobacco, and vaping devices, such as e-cigarettes. If you need help quitting, ask your health care provider. Monitor your blood pressure at home as told by your health care provider. Keep all follow-up visits. This is important. Medicines Take  over-the-counter and prescription medicines only as told by your health care provider. Follow directions carefully. Blood pressure medicines must be taken as prescribed. Do not skip doses of blood pressure medicine. Doing this puts you at risk for problems and can make the medicine less effective. Ask your health care provider about side effects or reactions to medicines that you should watch for. Contact a health care provider if you: Think you are having a reaction to a medicine you are taking. Have headaches that keep coming back (recurring). Feel dizzy. Have swelling in your ankles. Have trouble with your vision. Get help right away if you: Develop a severe headache or confusion. Have unusual weakness or numbness. Feel faint. Have severe pain in your chest or abdomen. Vomit repeatedly. Have trouble breathing. These symptoms may be an emergency. Get help right away. Call 911. Do not wait to see if the symptoms will go away. Do not drive yourself to the hospital. Summary Hypertension is when the force of blood pumping through your arteries is too strong. If this condition is not controlled, it may put you at risk for serious complications. Your personal target blood pressure may vary depending on your medical conditions, your age, and other factors. For most people, a normal blood pressure is less than 120/80. Hypertension is  treated with lifestyle changes, medicines, or a combination of both. Lifestyle changes include losing weight, eating a healthy, low-sodium diet, exercising more, and limiting alcohol. This information is not intended to replace advice given to you by your health care provider. Make sure you discuss any questions you have with your health care provider. Document Revised: 12/26/2020 Document Reviewed: 12/26/2020 Elsevier Patient Education  [place].

## 2022-04-08 NOTE — Progress Notes (Addendum)
Subjective:   By signing my name below, I, Madelin Rear, attest that this documentation has been prepared under the direction and in the presence of Mosie Lukes., MD. 04/08/2022.   Patient ID: Brenda [name], female    DOB: [birth year], 77 y.o.   MRN: [id number]  Chief Complaint  Patient presents with   Follow-up    Follow up    HPI Patient is in today for an office visit.  Generally she is feeling well. Her blood pressure is well controlled in clinic today. Last week it was reportedly 122/78 at another clinic visit. BP Readings from Last 3 Encounters:  04/08/22 118/74  04/04/22 121/64  03/13/22 (!) 140/70   Recently she denies any significant acid reflux.  She is scheduled to have anterior bladder repair with sacrospinous fixation on 05/02/22, to be performed by Dr. Wannetta Sender. In April she will have a colonoscopy.  She continues to work on weight loss. She follows a healthy diet and regularly drinks protein shakes. Also takes fish oil supplements. She reports her weight last week was closer to 196 lbs. Wt Readings from Last 3 Encounters:  04/08/22 215 lb 6.4 oz (97.7 kg)  04/04/22 210 lb (95.3 kg)  02/07/22 211 lb (95.7 kg)   We discussed the new RSV vaccination. She is also due for Tdap (last received 04/17/2011.  On 03/15/22 she lost her sister to dementia. She was 63 yo.  Reports no recent febrile illness or hospitalizations. Denies CP/ palp/ SOB/ HA/ congestion/fevers/GI c/o. Taking meds as prescribed.    Past Medical History:  Diagnosis Date   Anxiety 10/17/2010   Arthritis 11/25/2011   Carpal tunnel syndrome on right 01/20/2012   Cervical cancer screening 04/26/2013   Menarche at 14 regular  Menopause surgical at 80  G1P1 s/p SVD  Partial hysterectomy for fibroids with bladder tack  Tubal  No h/o abnormal pap, last pap prior to hysterectomy  Mgm UTD, no concerns   Colon cancer screening 04/17/2011   Depression    counseling   Diabetes mellitus type 2 in obese  ([place]) 01/31/2013   DOE (dyspnea on exertion) 12/26/2013   Evaluated  in Pulmonary clinic/ [facility]/ [facility] 01/03/2014   - 01/03/2014  Walked RA  @ mod pace x 2 laps @ 185 ft each stopped due to fatigue> sob, no desat   Evaluate for OSA (obstructive sleep apnea) 02/14/2014   Feeling grief 09/30/2018   GERD (gastroesophageal reflux disease)    History of chicken pox    childhood age 45   History of colon polyps 01/31/2021   Hyperlipidemia    2011   Hypertension    Left hip pain 03/19/2016   Left knee pain 01/05/2019   Lower back injury, initial encounter 04/02/2016   Lumbar disc disease with radiculopathy 01/20/2012   LVH (left ventricular hypertrophy) 01/30/2021   Medicare annual wellness visit, subsequent 12/25/2014   Muscle spasm 12/25/2014   Numerous moles 05/09/2015   Obesity 10/17/2010   Osteoarthritis of left glenohumeral joint 12/05/2015   Osteopenia 03/19/2016   Oxygen desaturation during sleep 05/24/2014   Preventative health care 03/24/2016   Seasonal allergies    Urinary incontinence 08/24/2012    Past Surgical History:  Procedure Laterality Date   BLADDER REPAIR     2002   TOTAL ABDOMINAL HYSTERECTOMY     2002    Family History  Problem Relation Age of Onset   Dementia Mother    Heart disease Father 55   Hypertension Father  Colon cancer Sister 21   Dementia Sister    Breast cancer Sister 79   Thyroid disease Sister    Depression Sister        anxiety   Obesity Daughter    Cancer Paternal Grandfather        bone    Social History   Socioeconomic History   Marital status: Divorced    Spouse name: Not on file   Number of children: Not on file   Years of education: Not on file   Highest education level: Not on file  Occupational History   Not on file  Tobacco Use   Smoking status: Former    Packs/day: 0.20    Years: 15.00    Total pack years: 3.00    Types: Cigarettes    Passive exposure: Past   Smokeless tobacco: Never   Tobacco  comments:    quit 10 years ago  Vaping Use   Vaping Use: Never used  Substance and Sexual Activity   Alcohol use: No    Alcohol/week: 0.0 standard drinks of alcohol   Drug use: No   Sexual activity: Yes  Other Topics Concern   Not on file  Social History Narrative   Not on file   Social Determinants of Health   Financial Resource Strain: Medium Risk (08/23/2020)   Overall Financial Resource Strain (CARDIA)    Difficulty of Paying Living Expenses: Somewhat hard  Food Insecurity: Food Insecurity Present (08/23/2020)   Hunger Vital Sign    Worried About Running Out of Food in the Last Year: Often true    Ran Out of Food in the Last Year: Never true  Transportation Needs: No Transportation Needs (08/23/2020)   PRAPARE - Hydrologist (Medical): No    Lack of Transportation (Non-Medical): No  Physical Activity: Inactive (08/23/2020)   Exercise Vital Sign    Days of Exercise per Week: 0 days    Minutes of Exercise per Session: 0 min  Stress: No Stress Concern Present (08/23/2020)   [place]    Feeling of Stress : Not at all  Social Connections: Moderately Isolated (08/23/2020)   Social Connection and Isolation Panel [NHANES]    Frequency of Communication with Friends and Family: More than three times a week    Frequency of Social Gatherings with Friends and Family: More than three times a week    Attends Religious Services: More than 4 times per year    Active Member of [employer] or Organizations: No    Attends Archivist Meetings: Never    Marital Status: Divorced  Human resources officer Violence: Not At Risk (08/23/2020)   Humiliation, Afraid, Rape, and Kick questionnaire    Fear of Current or Ex-Partner: No    Emotionally Abused: No    Physically Abused: No    Sexually Abused: No    Outpatient Medications Prior to Visit  Medication Sig Dispense Refill   acetaminophen (TYLENOL) 500  MG tablet Take 1 tablet (500 mg total) by mouth every 6 (six) hours as needed (pain). 30 tablet 0   acetaminophen (TYLENOL) 650 MG CR tablet Take 1,300 mg by mouth daily.     aspirin 81 MG tablet Take 81 mg by mouth daily.     blood glucose meter kit and supplies Dispense based on patient and insurance preference. Use Prn to check blood suagr 1 each 1   celecoxib (CELEBREX) 100 MG capsule Take 100  mg by mouth daily.     Cyanocobalamin (VITAMIN B 12 PO) Take 1,000 Units by mouth daily.     escitalopram (LEXAPRO) 20 MG tablet Take 2 tablets (40 mg total) by mouth daily. 180 tablet 1   gabapentin (NEURONTIN) 100 MG capsule Take 1 capsule (100 mg total) by mouth 3 (three) times daily. 30 capsule 0   ibuprofen (ADVIL) 600 MG tablet Take 1 tablet (600 mg total) by mouth every 6 (six) hours as needed. 30 tablet 0   polyethylene glycol powder (GLYCOLAX/MIRALAX) 17 GM/SCOOP powder Take 17 g by mouth daily. Drink 17g (1 scoop) dissolved in water per day. 255 g 0   Probiotic Product (PROBIOTIC DAILY) CAPS Take 1 capsule by mouth daily.     traMADol (ULTRAM) 50 MG tablet Take 1 tablet by mouth every 6 (six) hours as needed for moderate pain or severe pain.     TURMERIC PO Take 1 capsule by mouth daily.     atorvastatin (LIPITOR) 80 MG tablet Take 1 tablet (80 mg total) by mouth daily. 90 tablet 3   famotidine (PEPCID) 40 MG tablet Take 1 tablet (40 mg total) by mouth daily. 90 tablet 1   irbesartan (AVAPRO) 150 MG tablet Take 1 tablet (150 mg total) by mouth daily. 90 tablet 1   metFORMIN (GLUCOPHAGE) 500 MG tablet TAKE ONE TABLET BY MOUTH BEFORE LUNCH 30 tablet 0   triamterene-hydrochlorothiazide (MAXZIDE-25) 37.5-25 MG tablet Take 1 tablet by mouth daily. 90 tablet 1   No facility-administered medications prior to visit.    Allergies  Allergen Reactions   Meloxicam Nausea Only and Nausea And Vomiting    Review of Systems  Constitutional:  Negative for fever.  HENT:  Negative for congestion, sinus  pain and sore throat.   Respiratory:  Negative for cough, shortness of breath and wheezing.   Cardiovascular:  Negative for chest pain and palpitations.  Gastrointestinal:  Negative for blood in stool, constipation, diarrhea, nausea and vomiting.  Genitourinary:  Negative for dysuria, frequency and hematuria.  Musculoskeletal:  Negative for joint pain and myalgias.       Objective:    Physical Exam Constitutional:      Appearance: Normal appearance.  HENT:     Head: Normocephalic and atraumatic.     Right Ear: Tympanic membrane, ear canal and external ear normal.     Left Ear: Tympanic membrane, ear canal and external ear normal.  Eyes:     Extraocular Movements: Extraocular movements intact.     Pupils: Pupils are equal, round, and reactive to light.  Cardiovascular:     Rate and Rhythm: Normal rate and regular rhythm.     Heart sounds: Normal heart sounds. No murmur heard.    No gallop.  Pulmonary:     Effort: Pulmonary effort is normal. No respiratory distress.     Breath sounds: Normal breath sounds. No wheezing or rales.  Abdominal:     General: Bowel sounds are normal. There is no distension.     Palpations: Abdomen is soft.     Tenderness: There is no abdominal tenderness. There is no guarding.  Musculoskeletal:        General: Normal range of motion.  Skin:    General: Skin is warm and dry.  Neurological:     General: No focal deficit present.     Mental Status: She is alert and oriented to person, place, and time.  Psychiatric:        Mood and Affect: Mood  normal.        Behavior: Behavior normal.     BP 118/74 (BP Location: Right Arm, Patient Position: Sitting, Cuff Size: Normal)   Pulse 93   Temp (!) 97.5 F (36.4 C) (Oral)   Resp 16   Ht 5' 7"$  (1.702 m)   Wt 215 lb 6.4 oz (97.7 kg)   SpO2 95%   BMI 33.74 kg/m  Wt Readings from Last 3 Encounters:  04/08/22 215 lb 6.4 oz (97.7 kg)  04/04/22 210 lb (95.3 kg)  02/07/22 211 lb (95.7 kg)    Diabetic  Foot Exam - Simple   No data filed    Lab Results  Component Value Date   WBC 5.2 02/07/2022   HGB 13.3 02/07/2022   HCT 39.6 02/07/2022   PLT 363.0 02/07/2022   GLUCOSE 105 (H) 02/07/2022   CHOL 134 02/07/2022   TRIG 81.0 02/07/2022   HDL 51.50 02/07/2022   LDLDIRECT 91.0 05/29/2021   LDLCALC 66 02/07/2022   ALT 13 02/07/2022   AST 16 02/07/2022   NA 139 02/07/2022   K 4.0 02/07/2022   CL 106 02/07/2022   CREATININE 1.05 02/07/2022   BUN 31 (H) 02/07/2022   CO2 26 02/07/2022   TSH 1.85 02/07/2022   HGBA1C 6.9 (H) 02/07/2022   MICROALBUR 1.2 05/29/2021    Lab Results  Component Value Date   TSH 1.85 02/07/2022   Lab Results  Component Value Date   WBC 5.2 02/07/2022   HGB 13.3 02/07/2022   HCT 39.6 02/07/2022   MCV 89.8 02/07/2022   PLT 363.0 02/07/2022   Lab Results  Component Value Date   NA 139 02/07/2022   K 4.0 02/07/2022   CO2 26 02/07/2022   GLUCOSE 105 (H) 02/07/2022   BUN 31 (H) 02/07/2022   CREATININE 1.05 02/07/2022   BILITOT 0.3 02/07/2022   ALKPHOS 91 02/07/2022   AST 16 02/07/2022   ALT 13 02/07/2022   PROT 7.4 02/07/2022   ALBUMIN 4.1 02/07/2022   CALCIUM 9.1 02/07/2022   GFR 51.69 (L) 02/07/2022   Lab Results  Component Value Date   CHOL 134 02/07/2022   Lab Results  Component Value Date   HDL 51.50 02/07/2022   Lab Results  Component Value Date   LDLCALC 66 02/07/2022   Lab Results  Component Value Date   TRIG 81.0 02/07/2022   Lab Results  Component Value Date   CHOLHDL 3 02/07/2022   Lab Results  Component Value Date   HGBA1C 6.9 (H) 02/07/2022       Assessment & Plan:   Problem List Items Addressed This Visit     Diabetes mellitus type 2 in obese ([place]) - Primary    hgba1c acceptable but increasing will add Metformin 500 mg po qam, minimize simple carbs. Increase exercise as tolerated. Continue current meds. Given rx for glucometer, lancets and test strips, check sugars daily and as needed       Hyperlipidemia    Encourage heart healthy diet such as MIND or DASH diet, increase exercise, avoid trans fats, simple carbohydrates and processed foods, consider a krill or fish or flaxseed oil cap daily. Tolerating Atorvastatin.       Hypertension    Well controlled, no changes to meds. Encouraged heart healthy diet such as the DASH diet and exercise as tolerated.       Muscle spasm    Hydrate and monitor       Osteopenia    Encouraged to get  adequate exercise, calcium and vitamin d intake         No orders of the defined types were placed in this encounter.   I, Penni Homans, MD, personally preformed the services described in this documentation.  All medical record entries made by the scribe were at my direction and in my presence.  I have reviewed the chart and discharge instructions (if applicable) and agree that the record reflects my personal performance and is accurate and complete. 04/08/2022.  I,Mathew Stumpf,acting as a Education administrator for Penni Homans, MD.,have documented all relevant documentation on the behalf of Penni Homans, MD,as directed by  Penni Homans, MD while in the presence of Penni Homans, MD.   Penni Homans, MD

## 2022-04-08 NOTE — Progress Notes (Deleted)
MyChart Video Visit    Virtual Visit via Video Note   This visit type was conducted due to national recommendations for restrictions regarding the COVID-19 Pandemic (e.g. social distancing) in an effort to limit this patient's exposure and mitigate transmission in our community. This patient is at least at moderate risk for complications without adequate follow up. This format is felt to be most appropriate for this patient at this time. Physical exam was limited by quality of the video and audio technology used for the visit. *** was able to get the patient set up on a video visit.  Patient location: *** Patient and provider in visit Provider location: Office  I discussed the limitations of evaluation and management by telemedicine and the availability of in person appointments. The patient expressed understanding and agreed to proceed.  Visit Date: 04/08/2022  Today's healthcare provider: Penni Homans, MD     Subjective:    Patient ID: Brenda [name], female    DOB: 02-18-1946, 77 y.o.   MRN: [id number]  Chief Complaint  Patient presents with   Follow-up    Follow up    HPI Patient is in today for ***  Past Medical History:  Diagnosis Date   Anxiety 10/17/2010   Arthritis 11/25/2011   Carpal tunnel syndrome on right 01/20/2012   Cervical cancer screening 04/26/2013   Menarche at 14 regular  Menopause surgical at 38  G1P1 s/p SVD  Partial hysterectomy for fibroids with bladder tack  Tubal  No h/o abnormal pap, last pap prior to hysterectomy  Mgm UTD, no concerns   Colon cancer screening 04/17/2011   Depression    counseling   Diabetes mellitus type 2 in obese ([place]) 01/31/2013   DOE (dyspnea on exertion) 12/26/2013   Evaluated  in Pulmonary clinic/ [facility]/ [place] 01/03/2014   - 01/03/2014  Walked RA  @ mod pace x 2 laps @ 185 ft each stopped due to fatigue> sob, no desat   Evaluate for OSA (obstructive sleep apnea) 02/14/2014   Feeling grief 09/30/2018   GERD  (gastroesophageal reflux disease)    History of chicken pox    childhood age 77   History of colon polyps 01/31/2021   Hyperlipidemia    2011   Hypertension    Left hip pain 03/19/2016   Left knee pain 01/05/2019   Lower back injury, initial encounter 04/02/2016   Lumbar disc disease with radiculopathy 01/20/2012   LVH (left ventricular hypertrophy) 01/30/2021   Medicare annual wellness visit, subsequent 12/25/2014   Muscle spasm 12/25/2014   Numerous moles 05/09/2015   Obesity 10/17/2010   Osteoarthritis of left glenohumeral joint 12/05/2015   Osteopenia 03/19/2016   Oxygen desaturation during sleep 05/24/2014   Preventative health care 03/24/2016   Seasonal allergies    Urinary incontinence 08/24/2012    Past Surgical History:  Procedure Laterality Date   BLADDER REPAIR     2002   TOTAL ABDOMINAL HYSTERECTOMY     2002    Family History  Problem Relation Age of Onset   Dementia Mother    Heart disease Father 30   Hypertension Father    Colon cancer Sister 32   Dementia Sister    Breast cancer Sister 92   Thyroid disease Sister    Depression Sister        anxiety   Obesity Daughter    Cancer Paternal Grandfather        bone    Social History   Socioeconomic History  Marital status: Divorced    Spouse name: Not on file   Number of children: Not on file   Years of education: Not on file   Highest education level: Not on file  Occupational History   Not on file  Tobacco Use   Smoking status: Former    Packs/day: 0.20    Years: 15.00    Total pack years: 3.00    Types: Cigarettes    Passive exposure: Past   Smokeless tobacco: Never   Tobacco comments:    quit 10 years ago  Vaping Use   Vaping Use: Never used  Substance and Sexual Activity   Alcohol use: No    Alcohol/week: 0.0 standard drinks of alcohol   Drug use: No   Sexual activity: Yes  Other Topics Concern   Not on file  Social History Narrative   Not on file   Social Determinants of  Health   Financial Resource Strain: Medium Risk (08/23/2020)   Overall Financial Resource Strain (CARDIA)    Difficulty of Paying Living Expenses: Somewhat hard  Food Insecurity: Food Insecurity Present (08/23/2020)   Hunger Vital Sign    Worried About Running Out of Food in the Last Year: Often true    Ran Out of Food in the Last Year: Never true  Transportation Needs: No Transportation Needs (08/23/2020)   PRAPARE - Hydrologist (Medical): No    Lack of Transportation (Non-Medical): No  Physical Activity: Inactive (08/23/2020)   Exercise Vital Sign    Days of Exercise per Week: 0 days    Minutes of Exercise per Session: 0 min  Stress: No Stress Concern Present (08/23/2020)   [place]    Feeling of Stress : Not at all  Social Connections: Moderately Isolated (08/23/2020)   Social Connection and Isolation Panel [NHANES]    Frequency of Communication with Friends and Family: More than three times a week    Frequency of Social Gatherings with Friends and Family: More than three times a week    Attends Religious Services: More than 4 times per year    Active Member of [employer] or Organizations: No    Attends Archivist Meetings: Never    Marital Status: Divorced  Human resources officer Violence: Not At Risk (08/23/2020)   Humiliation, Afraid, Rape, and Kick questionnaire    Fear of Current or Ex-Partner: No    Emotionally Abused: No    Physically Abused: No    Sexually Abused: No    Outpatient Medications Prior to Visit  Medication Sig Dispense Refill   acetaminophen (TYLENOL) 500 MG tablet Take 1 tablet (500 mg total) by mouth every 6 (six) hours as needed (pain). 30 tablet 0   acetaminophen (TYLENOL) 650 MG CR tablet Take 1,300 mg by mouth daily.     aspirin 81 MG tablet Take 81 mg by mouth daily.     atorvastatin (LIPITOR) 80 MG tablet Take 1 tablet (80 mg total) by mouth daily. 90  tablet 3   blood glucose meter kit and supplies Dispense based on patient and insurance preference. Use Prn to check blood suagr 1 each 1   celecoxib (CELEBREX) 100 MG capsule Take 100 mg by mouth daily.     Cyanocobalamin (VITAMIN B 12 PO) Take 1,000 Units by mouth daily.     escitalopram (LEXAPRO) 20 MG tablet Take 2 tablets (40 mg total) by mouth daily. 180 tablet 1   famotidine (  PEPCID) 40 MG tablet Take 1 tablet (40 mg total) by mouth daily. 90 tablet 1   gabapentin (NEURONTIN) 100 MG capsule Take 1 capsule (100 mg total) by mouth 3 (three) times daily. 30 capsule 0   ibuprofen (ADVIL) 600 MG tablet Take 1 tablet (600 mg total) by mouth every 6 (six) hours as needed. 30 tablet 0   irbesartan (AVAPRO) 150 MG tablet Take 1 tablet (150 mg total) by mouth daily. 90 tablet 1   metFORMIN (GLUCOPHAGE) 500 MG tablet TAKE ONE TABLET BY MOUTH BEFORE LUNCH 30 tablet 0   polyethylene glycol powder (GLYCOLAX/MIRALAX) 17 GM/SCOOP powder Take 17 g by mouth daily. Drink 17g (1 scoop) dissolved in water per day. 255 g 0   Probiotic Product (PROBIOTIC DAILY) CAPS Take 1 capsule by mouth daily.     traMADol (ULTRAM) 50 MG tablet Take 1 tablet by mouth every 6 (six) hours as needed for moderate pain or severe pain.     triamterene-hydrochlorothiazide (MAXZIDE-25) 37.5-25 MG tablet Take 1 tablet by mouth daily. 90 tablet 1   TURMERIC PO Take 1 capsule by mouth daily.     No facility-administered medications prior to visit.    Allergies  Allergen Reactions   Meloxicam Nausea Only and Nausea And Vomiting    ROS     Objective:    Physical Exam  BP 118/74 (BP Location: Right Arm, Patient Position: Sitting, Cuff Size: Normal)   Pulse 93   Temp (!) 97.5 F (36.4 C) (Oral)   Resp 16   Ht 5' 7"$  (1.702 m)   Wt 215 lb 6.4 oz (97.7 kg)   SpO2 95%   BMI 33.74 kg/m  Wt Readings from Last 3 Encounters:  04/08/22 215 lb 6.4 oz (97.7 kg)  04/04/22 210 lb (95.3 kg)  02/07/22 211 lb (95.7 kg)        Assessment & Plan:  Diabetes mellitus type 2 in obese [facility]) Assessment & Plan: hgba1c acceptable but increasing will add Metformin 500 mg po qam, minimize simple carbs. Increase exercise as tolerated. Continue current meds. Given rx for glucometer, lancets and test strips, check sugars daily and as needed   Mixed hyperlipidemia Assessment & Plan: Encourage heart healthy diet such as MIND or DASH diet, increase exercise, avoid trans fats, simple carbohydrates and processed foods, consider a krill or fish or flaxseed oil cap daily. Tolerating Atorvastatin.    Primary hypertension Assessment & Plan: Well controlled, no changes to meds. Encouraged heart healthy diet such as the DASH diet and exercise as tolerated.    Osteopenia, unspecified location Assessment & Plan: Encouraged to get adequate exercise, calcium and vitamin d intake    Muscle spasm Assessment & Plan: Hydrate and monitor       I discussed the assessment and treatment plan with the patient. The patient was provided an opportunity to ask questions and all were answered. The patient agreed with the plan and demonstrated an understanding of the instructions.   The patient was advised to call back or seek an in-person evaluation if the symptoms worsen or if the condition fails to improve as anticipated.  Penni Homans, MD [facility] [place] (phone) [phone] (fax)  [place]

## 2022-04-10 ENCOUNTER — Other Ambulatory Visit: Payer: Self-pay | Admitting: Family Medicine

## 2022-04-11 ENCOUNTER — Other Ambulatory Visit: Payer: Self-pay | Admitting: Family Medicine

## 2022-04-11 DIAGNOSIS — E669 Obesity, unspecified: Secondary | ICD-10-CM

## 2022-04-14 NOTE — Progress Notes (Deleted)
Subjective:    Patient ID: Brenda [name], female    DOB: [birth year], 77 y.o.   MRN: [id number]  Chief Complaint  Patient presents with   Follow-up    Follow up    HPI Patient is in today for follow up on chronic medical concerns. No recent febrile illness or acute hospitalizations. No complaints of polyuria or polydipsia. She is trying to stay active and eat well. Denies CP/palp/SOB/HA/congestion/fevers/GI or GU c/o. Taking meds as prescribed   Past Medical History:  Diagnosis Date   Anxiety 10/17/2010   Arthritis 11/25/2011   Carpal tunnel syndrome on right 01/20/2012   Cervical cancer screening 04/26/2013   Menarche at 14 regular  Menopause surgical at 27  G1P1 s/p SVD  Partial hysterectomy for fibroids with bladder tack  Tubal  No h/o abnormal pap, last pap prior to hysterectomy  Mgm UTD, no concerns   Colon cancer screening 04/17/2011   Depression    counseling   Diabetes mellitus type 2 in obese ([place]) 01/31/2013   DOE (dyspnea on exertion) 12/26/2013   Evaluated  in Pulmonary clinic/ [facility]/ [place] 01/03/2014   - 01/03/2014  Walked RA  @ mod pace x 2 laps @ 185 ft each stopped due to fatigue> sob, no desat   Evaluate for OSA (obstructive sleep apnea) 02/14/2014   Feeling grief 09/30/2018   GERD (gastroesophageal reflux disease)    History of chicken pox    childhood age 52   History of colon polyps 01/31/2021   Hyperlipidemia    2011   Hypertension    Left hip pain 03/19/2016   Left knee pain 01/05/2019   Lower back injury, initial encounter 04/02/2016   Lumbar disc disease with radiculopathy 01/20/2012   LVH (left ventricular hypertrophy) 01/30/2021   Medicare annual wellness visit, subsequent 12/25/2014   Muscle spasm 12/25/2014   Numerous moles 05/09/2015   Obesity 10/17/2010   Osteoarthritis of left glenohumeral joint 12/05/2015   Osteopenia 03/19/2016   Oxygen desaturation during sleep 05/24/2014   Preventative health care 03/24/2016   Seasonal  allergies    Urinary incontinence 08/24/2012    Past Surgical History:  Procedure Laterality Date   BLADDER REPAIR     2002   TOTAL ABDOMINAL HYSTERECTOMY     2002    Family History  Problem Relation Age of Onset   Dementia Mother    Heart disease Father 26   Hypertension Father    Colon cancer Sister 67   Dementia Sister    Breast cancer Sister 71   Thyroid disease Sister    Depression Sister        anxiety   Obesity Daughter    Cancer Paternal Grandfather        bone    Social History   Socioeconomic History   Marital status: Divorced    Spouse name: Not on file   Number of children: Not on file   Years of education: Not on file   Highest education level: Not on file  Occupational History   Not on file  Tobacco Use   Smoking status: Former    Packs/day: 0.20    Years: 15.00    Total pack years: 3.00    Types: Cigarettes    Passive exposure: Past   Smokeless tobacco: Never   Tobacco comments:    quit 10 years ago  Vaping Use   Vaping Use: Never used  Substance and Sexual Activity   Alcohol use: No  Alcohol/week: 0.0 standard drinks of alcohol   Drug use: No   Sexual activity: Yes  Other Topics Concern   Not on file  Social History Narrative   Not on file   Social Determinants of Health   Financial Resource Strain: Medium Risk (08/23/2020)   Overall Financial Resource Strain (CARDIA)    Difficulty of Paying Living Expenses: Somewhat hard  Food Insecurity: Food Insecurity Present (08/23/2020)   Hunger Vital Sign    Worried About Running Out of Food in the Last Year: Often true    Ran Out of Food in the Last Year: Never true  Transportation Needs: No Transportation Needs (08/23/2020)   PRAPARE - Hydrologist (Medical): No    Lack of Transportation (Non-Medical): No  Physical Activity: Inactive (08/23/2020)   Exercise Vital Sign    Days of Exercise per Week: 0 days    Minutes of Exercise per Session: 0 min  Stress: No  Stress Concern Present (08/23/2020)   [place]    Feeling of Stress : Not at all  Social Connections: Moderately Isolated (08/23/2020)   Social Connection and Isolation Panel [NHANES]    Frequency of Communication with Friends and Family: More than three times a week    Frequency of Social Gatherings with Friends and Family: More than three times a week    Attends Religious Services: More than 4 times per year    Active Member of [employer] or Organizations: No    Attends Archivist Meetings: Never    Marital Status: Divorced  Human resources officer Violence: Not At Risk (08/23/2020)   Humiliation, Afraid, Rape, and Kick questionnaire    Fear of Current or Ex-Partner: No    Emotionally Abused: No    Physically Abused: No    Sexually Abused: No    Outpatient Medications Prior to Visit  Medication Sig Dispense Refill   acetaminophen (TYLENOL) 500 MG tablet Take 1 tablet (500 mg total) by mouth every 6 (six) hours as needed (pain). 30 tablet 0   acetaminophen (TYLENOL) 650 MG CR tablet Take 1,300 mg by mouth daily.     aspirin 81 MG tablet Take 81 mg by mouth daily.     blood glucose meter kit and supplies Dispense based on patient and insurance preference. Use Prn to check blood suagr 1 each 1   celecoxib (CELEBREX) 100 MG capsule Take 100 mg by mouth daily.     Cyanocobalamin (VITAMIN B 12 PO) Take 1,000 Units by mouth daily.     escitalopram (LEXAPRO) 20 MG tablet Take 2 tablets (40 mg total) by mouth daily. 180 tablet 1   gabapentin (NEURONTIN) 100 MG capsule Take 1 capsule (100 mg total) by mouth 3 (three) times daily. 30 capsule 0   ibuprofen (ADVIL) 600 MG tablet Take 1 tablet (600 mg total) by mouth every 6 (six) hours as needed. 30 tablet 0   polyethylene glycol powder (GLYCOLAX/MIRALAX) 17 GM/SCOOP powder Take 17 g by mouth daily. Drink 17g (1 scoop) dissolved in water per day. 255 g 0   Probiotic Product (PROBIOTIC  DAILY) CAPS Take 1 capsule by mouth daily.     traMADol (ULTRAM) 50 MG tablet Take 1 tablet by mouth every 6 (six) hours as needed for moderate pain or severe pain.     TURMERIC PO Take 1 capsule by mouth daily.     atorvastatin (LIPITOR) 80 MG tablet Take 1 tablet (80 mg total)  by mouth daily. 90 tablet 3   famotidine (PEPCID) 40 MG tablet Take 1 tablet (40 mg total) by mouth daily. 90 tablet 1   irbesartan (AVAPRO) 150 MG tablet Take 1 tablet (150 mg total) by mouth daily. 90 tablet 1   metFORMIN (GLUCOPHAGE) 500 MG tablet TAKE ONE TABLET BY MOUTH BEFORE LUNCH 30 tablet 0   triamterene-hydrochlorothiazide (MAXZIDE-25) 37.5-25 MG tablet Take 1 tablet by mouth daily. 90 tablet 1   No facility-administered medications prior to visit.    Allergies  Allergen Reactions   Meloxicam Nausea Only and Nausea And Vomiting    Review of Systems  Constitutional:  Negative for fever and malaise/fatigue.  HENT:  Negative for congestion.   Eyes:  Negative for blurred vision.  Respiratory:  Negative for shortness of breath.   Cardiovascular:  Negative for chest pain, palpitations and leg swelling.  Gastrointestinal:  Negative for abdominal pain, blood in stool and nausea.  Genitourinary:  Negative for dysuria and frequency.  Musculoskeletal:  Negative for falls.  Skin:  Negative for rash.  Neurological:  Negative for dizziness, loss of consciousness and headaches.  Endo/Heme/Allergies:  Negative for environmental allergies.  Psychiatric/Behavioral:  Negative for depression. The patient is not nervous/anxious.        Objective:    Physical Exam Constitutional:      General: She is not in acute distress.    Appearance: Normal appearance. She is well-developed. She is not toxic-appearing.  HENT:     Head: Normocephalic and atraumatic.     Right Ear: External ear normal.     Left Ear: External ear normal.     Nose: Nose normal.  Eyes:     General:        Right eye: No discharge.        Left  eye: No discharge.     Conjunctiva/sclera: Conjunctivae normal.  Neck:     Thyroid: No thyromegaly.  Cardiovascular:     Rate and Rhythm: Normal rate and regular rhythm.     Heart sounds: Normal heart sounds. No murmur heard. Pulmonary:     Effort: Pulmonary effort is normal. No respiratory distress.     Breath sounds: Normal breath sounds.  Abdominal:     General: Bowel sounds are normal.     Palpations: Abdomen is soft.     Tenderness: There is no abdominal tenderness. There is no guarding.  Musculoskeletal:        General: Normal range of motion.     Cervical back: Neck supple.  Lymphadenopathy:     Cervical: No cervical adenopathy.  Skin:    General: Skin is warm and dry.  Neurological:     Mental Status: She is alert and oriented to person, place, and time.  Psychiatric:        Mood and Affect: Mood normal.        Behavior: Behavior normal.        Thought Content: Thought content normal.        Judgment: Judgment normal.     BP 118/74 (BP Location: Right Arm, Patient Position: Sitting, Cuff Size: Normal)   Pulse 93   Temp (!) 97.5 F (36.4 C) (Oral)   Resp 16   Ht 5' 7"$  (1.702 m)   Wt 215 lb 6.4 oz (97.7 kg)   SpO2 95%   BMI 33.74 kg/m  Wt Readings from Last 3 Encounters:  04/08/22 215 lb 6.4 oz (97.7 kg)  04/04/22 210 lb (95.3 kg)  02/07/22 211  lb (95.7 kg)    Diabetic Foot Exam - Simple   No data filed    Lab Results  Component Value Date   WBC 5.2 02/07/2022   HGB 13.3 02/07/2022   HCT 39.6 02/07/2022   PLT 363.0 02/07/2022   GLUCOSE 105 (H) 02/07/2022   CHOL 134 02/07/2022   TRIG 81.0 02/07/2022   HDL 51.50 02/07/2022   LDLDIRECT 91.0 05/29/2021   LDLCALC 66 02/07/2022   ALT 13 02/07/2022   AST 16 02/07/2022   NA 139 02/07/2022   K 4.0 02/07/2022   CL 106 02/07/2022   CREATININE 1.05 02/07/2022   BUN 31 (H) 02/07/2022   CO2 26 02/07/2022   TSH 1.85 02/07/2022   HGBA1C 6.9 (H) 02/07/2022   MICROALBUR 1.2 05/29/2021    Lab Results   Component Value Date   TSH 1.85 02/07/2022   Lab Results  Component Value Date   WBC 5.2 02/07/2022   HGB 13.3 02/07/2022   HCT 39.6 02/07/2022   MCV 89.8 02/07/2022   PLT 363.0 02/07/2022   Lab Results  Component Value Date   NA 139 02/07/2022   K 4.0 02/07/2022   CO2 26 02/07/2022   GLUCOSE 105 (H) 02/07/2022   BUN 31 (H) 02/07/2022   CREATININE 1.05 02/07/2022   BILITOT 0.3 02/07/2022   ALKPHOS 91 02/07/2022   AST 16 02/07/2022   ALT 13 02/07/2022   PROT 7.4 02/07/2022   ALBUMIN 4.1 02/07/2022   CALCIUM 9.1 02/07/2022   GFR 51.69 (L) 02/07/2022   Lab Results  Component Value Date   CHOL 134 02/07/2022   Lab Results  Component Value Date   HDL 51.50 02/07/2022   Lab Results  Component Value Date   LDLCALC 66 02/07/2022   Lab Results  Component Value Date   TRIG 81.0 02/07/2022   Lab Results  Component Value Date   CHOLHDL 3 02/07/2022   Lab Results  Component Value Date   HGBA1C 6.9 (H) 02/07/2022       Assessment & Plan:  Diabetes mellitus type 2 in obese [facility]) Assessment & Plan: hgba1c acceptable but increasing will add Metformin 500 mg po qam, minimize simple carbs. Increase exercise as tolerated. Continue current meds. Given rx for glucometer, lancets and test strips, check sugars daily and as needed   Mixed hyperlipidemia Assessment & Plan: Encourage heart healthy diet such as MIND or DASH diet, increase exercise, avoid trans fats, simple carbohydrates and processed foods, consider a krill or fish or flaxseed oil cap daily. Tolerating Atorvastatin.    Primary hypertension Assessment & Plan: Well controlled, no changes to meds. Encouraged heart healthy diet such as the DASH diet and exercise as tolerated.    Osteopenia, unspecified location Assessment & Plan: Encouraged to get adequate exercise, calcium and vitamin d intake    Muscle spasm Assessment & Plan: Hydrate and monitor      Penni Homans, MD

## 2022-04-15 NOTE — Progress Notes (Signed)
Subjective:   By signing my name below, I, Brenda [name], attest that this documentation has been prepared under the direction and in the presence of Mosie Lukes., MD. 04/08/2022.   Patient ID: Brenda [name], female    DOB: [birth year], 77 y.o.   MRN: [id number]  Chief Complaint  Patient presents with   Follow-up    Follow up    HPI Patient is in today for an office visit.  Generally she is feeling well. Her blood pressure is well controlled in clinic today. Last week it was reportedly 122/78 at another clinic visit. BP Readings from Last 3 Encounters:  04/08/22 118/74  04/04/22 121/64  03/13/22 (!) 140/70   Recently she denies any significant acid reflux.  She is scheduled to have anterior bladder repair with sacrospinous fixation on 05/02/22, to be performed by Dr. Wannetta Sender. In April she will have a colonoscopy.  She continues to work on weight loss. She follows a healthy diet and regularly drinks protein shakes. Also takes fish oil supplements. She reports her weight last week was closer to 196 lbs. Wt Readings from Last 3 Encounters:  04/08/22 215 lb 6.4 oz (97.7 kg)  04/04/22 210 lb (95.3 kg)  02/07/22 211 lb (95.7 kg)   We discussed the new RSV vaccination. She is also due for Tdap (last received 04/17/2011.  On 03/15/22 she lost her sister to dementia. She was 18 yo.  Reports no recent febrile illness or hospitalizations. Denies CP/ palp/ SOB/ HA/ congestion/fevers/GI c/o. Taking meds as prescribed.    Past Medical History:  Diagnosis Date   Anxiety 10/17/2010   Arthritis 11/25/2011   Carpal tunnel syndrome on right 01/20/2012   Cervical cancer screening 04/26/2013   Menarche at 14 regular  Menopause surgical at 42  G1P1 s/p SVD  Partial hysterectomy for fibroids with bladder tack  Tubal  No h/o abnormal pap, last pap prior to hysterectomy  Mgm UTD, no concerns   Colon cancer screening 04/17/2011   Depression    counseling   Diabetes mellitus type 2 in obese  ([place]) 01/31/2013   DOE (dyspnea on exertion) 12/26/2013   Evaluated  in Pulmonary clinic/ [facility]/ [facility] 01/03/2014   - 01/03/2014  Walked RA  @ mod pace x 2 laps @ 185 ft each stopped due to fatigue> sob, no desat   Evaluate for OSA (obstructive sleep apnea) 02/14/2014   Feeling grief 09/30/2018   GERD (gastroesophageal reflux disease)    History of chicken pox    childhood age 25   History of colon polyps 01/31/2021   Hyperlipidemia    2011   Hypertension    Left hip pain 03/19/2016   Left knee pain 01/05/2019   Lower back injury, initial encounter 04/02/2016   Lumbar disc disease with radiculopathy 01/20/2012   LVH (left ventricular hypertrophy) 01/30/2021   Medicare annual wellness visit, subsequent 12/25/2014   Muscle spasm 12/25/2014   Numerous moles 05/09/2015   Obesity 10/17/2010   Osteoarthritis of left glenohumeral joint 12/05/2015   Osteopenia 03/19/2016   Oxygen desaturation during sleep 05/24/2014   Preventative health care 03/24/2016   Seasonal allergies    Urinary incontinence 08/24/2012    Past Surgical History:  Procedure Laterality Date   BLADDER REPAIR     2002   TOTAL ABDOMINAL HYSTERECTOMY     2002    Family History  Problem Relation Age of Onset   Dementia Mother    Heart disease Father 37   Hypertension Father  Colon cancer Sister 36   Dementia Sister    Breast cancer Sister 108   Thyroid disease Sister    Depression Sister        anxiety   Obesity Daughter    Cancer Paternal Grandfather        bone    Social History   Socioeconomic History   Marital status: Divorced    Spouse name: Not on file   Number of children: Not on file   Years of education: Not on file   Highest education level: Not on file  Occupational History   Not on file  Tobacco Use   Smoking status: Former    Packs/day: 0.20    Years: 15.00    Total pack years: 3.00    Types: Cigarettes    Passive exposure: Past   Smokeless tobacco: Never   Tobacco  comments:    quit 10 years ago  Vaping Use   Vaping Use: Never used  Substance and Sexual Activity   Alcohol use: No    Alcohol/week: 0.0 standard drinks of alcohol   Drug use: No   Sexual activity: Yes  Other Topics Concern   Not on file  Social History Narrative   Not on file   Social Determinants of Health   Financial Resource Strain: Medium Risk (08/23/2020)   Overall Financial Resource Strain (CARDIA)    Difficulty of Paying Living Expenses: Somewhat hard  Food Insecurity: Food Insecurity Present (08/23/2020)   Hunger Vital Sign    Worried About Running Out of Food in the Last Year: Often true    Ran Out of Food in the Last Year: Never true  Transportation Needs: No Transportation Needs (08/23/2020)   PRAPARE - Hydrologist (Medical): No    Lack of Transportation (Non-Medical): No  Physical Activity: Inactive (08/23/2020)   Exercise Vital Sign    Days of Exercise per Week: 0 days    Minutes of Exercise per Session: 0 min  Stress: No Stress Concern Present (08/23/2020)   [place]    Feeling of Stress : Not at all  Social Connections: Moderately Isolated (08/23/2020)   Social Connection and Isolation Panel [NHANES]    Frequency of Communication with Friends and Family: More than three times a week    Frequency of Social Gatherings with Friends and Family: More than three times a week    Attends Religious Services: More than 4 times per year    Active Member of [employer] or Organizations: No    Attends Archivist Meetings: Never    Marital Status: Divorced  Human resources officer Violence: Not At Risk (08/23/2020)   Humiliation, Afraid, Rape, and Kick questionnaire    Fear of Current or Ex-Partner: No    Emotionally Abused: No    Physically Abused: No    Sexually Abused: No    Outpatient Medications Prior to Visit  Medication Sig Dispense Refill   acetaminophen (TYLENOL) 500  MG tablet Take 1 tablet (500 mg total) by mouth every 6 (six) hours as needed (pain). 30 tablet 0   acetaminophen (TYLENOL) 650 MG CR tablet Take 1,300 mg by mouth daily.     aspirin 81 MG tablet Take 81 mg by mouth daily.     blood glucose meter kit and supplies Dispense based on patient and insurance preference. Use Prn to check blood suagr 1 each 1   celecoxib (CELEBREX) 100 MG capsule Take 100  mg by mouth daily.     Cyanocobalamin (VITAMIN B 12 PO) Take 1,000 Units by mouth daily.     escitalopram (LEXAPRO) 20 MG tablet Take 2 tablets (40 mg total) by mouth daily. 180 tablet 1   gabapentin (NEURONTIN) 100 MG capsule Take 1 capsule (100 mg total) by mouth 3 (three) times daily. 30 capsule 0   ibuprofen (ADVIL) 600 MG tablet Take 1 tablet (600 mg total) by mouth every 6 (six) hours as needed. 30 tablet 0   polyethylene glycol powder (GLYCOLAX/MIRALAX) 17 GM/SCOOP powder Take 17 g by mouth daily. Drink 17g (1 scoop) dissolved in water per day. 255 g 0   Probiotic Product (PROBIOTIC DAILY) CAPS Take 1 capsule by mouth daily.     traMADol (ULTRAM) 50 MG tablet Take 1 tablet by mouth every 6 (six) hours as needed for moderate pain or severe pain.     TURMERIC PO Take 1 capsule by mouth daily.     atorvastatin (LIPITOR) 80 MG tablet Take 1 tablet (80 mg total) by mouth daily. 90 tablet 3   famotidine (PEPCID) 40 MG tablet Take 1 tablet (40 mg total) by mouth daily. 90 tablet 1   irbesartan (AVAPRO) 150 MG tablet Take 1 tablet (150 mg total) by mouth daily. 90 tablet 1   metFORMIN (GLUCOPHAGE) 500 MG tablet TAKE ONE TABLET BY MOUTH BEFORE LUNCH 30 tablet 0   triamterene-hydrochlorothiazide (MAXZIDE-25) 37.5-25 MG tablet Take 1 tablet by mouth daily. 90 tablet 1   No facility-administered medications prior to visit.    Allergies  Allergen Reactions   Meloxicam Nausea Only and Nausea And Vomiting    Review of Systems  Constitutional:  Negative for fever.  HENT:  Negative for congestion, sinus  pain and sore throat.   Respiratory:  Negative for cough, shortness of breath and wheezing.   Cardiovascular:  Negative for chest pain and palpitations.  Gastrointestinal:  Negative for blood in stool, constipation, diarrhea, nausea and vomiting.  Genitourinary:  Negative for dysuria, frequency and hematuria.  Musculoskeletal:  Negative for joint pain and myalgias.       Objective:    Physical Exam Constitutional:      Appearance: Normal appearance.  HENT:     Head: Normocephalic and atraumatic.     Right Ear: Tympanic membrane, ear canal and external ear normal.     Left Ear: Tympanic membrane, ear canal and external ear normal.  Eyes:     Extraocular Movements: Extraocular movements intact.     Pupils: Pupils are equal, round, and reactive to light.  Cardiovascular:     Rate and Rhythm: Normal rate and regular rhythm.     Heart sounds: Normal heart sounds. No murmur heard.    No gallop.  Pulmonary:     Effort: Pulmonary effort is normal. No respiratory distress.     Breath sounds: Normal breath sounds. No wheezing or rales.  Abdominal:     General: Bowel sounds are normal. There is no distension.     Palpations: Abdomen is soft.     Tenderness: There is no abdominal tenderness. There is no guarding.  Musculoskeletal:        General: Normal range of motion.  Skin:    General: Skin is warm and dry.  Neurological:     General: No focal deficit present.     Mental Status: She is alert and oriented to person, place, and time.  Psychiatric:        Mood and Affect: Mood  normal.        Behavior: Behavior normal.     BP 118/74 (BP Location: Right Arm, Patient Position: Sitting, Cuff Size: Normal)   Pulse 93   Temp (!) 97.5 F (36.4 C) (Oral)   Resp 16   Ht 5' 7"$  (1.702 m)   Wt 215 lb 6.4 oz (97.7 kg)   SpO2 95%   BMI 33.74 kg/m  Wt Readings from Last 3 Encounters:  04/08/22 215 lb 6.4 oz (97.7 kg)  04/04/22 210 lb (95.3 kg)  02/07/22 211 lb (95.7 kg)    Diabetic  Foot Exam - Simple   No data filed    Lab Results  Component Value Date   WBC 5.2 02/07/2022   HGB 13.3 02/07/2022   HCT 39.6 02/07/2022   PLT 363.0 02/07/2022   GLUCOSE 105 (H) 02/07/2022   CHOL 134 02/07/2022   TRIG 81.0 02/07/2022   HDL 51.50 02/07/2022   LDLDIRECT 91.0 05/29/2021   LDLCALC 66 02/07/2022   ALT 13 02/07/2022   AST 16 02/07/2022   NA 139 02/07/2022   K 4.0 02/07/2022   CL 106 02/07/2022   CREATININE 1.05 02/07/2022   BUN 31 (H) 02/07/2022   CO2 26 02/07/2022   TSH 1.85 02/07/2022   HGBA1C 6.9 (H) 02/07/2022   MICROALBUR 1.2 05/29/2021    Lab Results  Component Value Date   TSH 1.85 02/07/2022   Lab Results  Component Value Date   WBC 5.2 02/07/2022   HGB 13.3 02/07/2022   HCT 39.6 02/07/2022   MCV 89.8 02/07/2022   PLT 363.0 02/07/2022   Lab Results  Component Value Date   NA 139 02/07/2022   K 4.0 02/07/2022   CO2 26 02/07/2022   GLUCOSE 105 (H) 02/07/2022   BUN 31 (H) 02/07/2022   CREATININE 1.05 02/07/2022   BILITOT 0.3 02/07/2022   ALKPHOS 91 02/07/2022   AST 16 02/07/2022   ALT 13 02/07/2022   PROT 7.4 02/07/2022   ALBUMIN 4.1 02/07/2022   CALCIUM 9.1 02/07/2022   GFR 51.69 (L) 02/07/2022   Lab Results  Component Value Date   CHOL 134 02/07/2022   Lab Results  Component Value Date   HDL 51.50 02/07/2022   Lab Results  Component Value Date   LDLCALC 66 02/07/2022   Lab Results  Component Value Date   TRIG 81.0 02/07/2022   Lab Results  Component Value Date   CHOLHDL 3 02/07/2022   Lab Results  Component Value Date   HGBA1C 6.9 (H) 02/07/2022       Assessment & Plan:   Problem List Items Addressed This Visit     Diabetes mellitus type 2 in obese ([place]) - Primary    hgba1c acceptable but increasing will add Metformin 500 mg po qam, minimize simple carbs. Increase exercise as tolerated. Continue current meds. Given rx for glucometer, lancets and test strips, check sugars daily and as needed       Hyperlipidemia    Encourage heart healthy diet such as MIND or DASH diet, increase exercise, avoid trans fats, simple carbohydrates and processed foods, consider a krill or fish or flaxseed oil cap daily. Tolerating Atorvastatin.       Hypertension    Well controlled, no changes to meds. Encouraged heart healthy diet such as the DASH diet and exercise as tolerated.       Muscle spasm    Hydrate and monitor       Osteopenia    Encouraged to get  adequate exercise, calcium and vitamin d intake         No orders of the defined types were placed in this encounter.   I, Penni Homans, MD, personally preformed the services described in this documentation.  All medical record entries made by the scribe were at my direction and in my presence.  I have reviewed the chart and discharge instructions (if applicable) and agree that the record reflects my personal performance and is accurate and complete. 04/08/2022.  I,Mathew Stumpf,acting as a Education administrator for Penni Homans, MD.,have documented all relevant documentation on the behalf of Penni Homans, MD,as directed by  Penni Homans, MD while in the presence of Penni Homans, MD.   Penni Homans, MD

## 2022-04-22 ENCOUNTER — Encounter: Payer: Self-pay | Admitting: *Deleted

## 2022-04-29 ENCOUNTER — Encounter (HOSPITAL_BASED_OUTPATIENT_CLINIC_OR_DEPARTMENT_OTHER): Payer: Self-pay | Admitting: Obstetrics and Gynecology

## 2022-04-29 NOTE — Progress Notes (Signed)
Spoke w/ via phone for pre-op interview--- pt Lab needs dos----   [employer], ekg            Lab results------ no COVID test -----patient states asymptomatic no test needed Arrive at ------- 0945 on 05-02-2022 NPO after MN NO Solid Food.  Clear liquids from MN until--- 0915 Med rec completed Medications to take morning of surgery ----- lexapro, tramadol, lipitor, pepcid Diabetic medication ----- do not take metformin Patient instructed no nail polish to be worn day of surgery Patient instructed to bring photo id and insurance card day of surgery Patient aware to have Driver (ride ) / caregiver    for 24 hours after surgery -- sister, jane  Patient Special Instructions ----- n/a Pre-Op special Istructions ----- n/a Patient verbalized understanding of instructions that were given at this phone interview. Patient denies shortness of breath, chest pain, fever, cough at this phone interview.

## 2022-05-02 ENCOUNTER — Encounter (HOSPITAL_BASED_OUTPATIENT_CLINIC_OR_DEPARTMENT_OTHER): Payer: Self-pay | Admitting: Obstetrics and Gynecology

## 2022-05-02 ENCOUNTER — Encounter (HOSPITAL_COMMUNITY): Payer: Self-pay | Admitting: Anesthesiology

## 2022-05-02 ENCOUNTER — Ambulatory Visit (HOSPITAL_BASED_OUTPATIENT_CLINIC_OR_DEPARTMENT_OTHER)
Admission: RE | Admit: 2022-05-02 | Payer: Medicare HMO | Source: Home / Self Care | Admitting: Obstetrics and Gynecology

## 2022-05-02 ENCOUNTER — Telehealth: Payer: Self-pay | Admitting: Obstetrics and Gynecology

## 2022-05-02 DIAGNOSIS — Z01818 Encounter for other preprocedural examination: Secondary | ICD-10-CM

## 2022-05-02 HISTORY — DX: Personal history of other benign neoplasm: Z86.018

## 2022-05-02 HISTORY — DX: Personal history of other diseases of the nervous system and sense organs: Z86.69

## 2022-05-02 HISTORY — DX: Other intervertebral disc degeneration, lumbar region without mention of lumbar back pain or lower extremity pain: M51.369

## 2022-05-02 HISTORY — DX: Mixed hyperlipidemia: E78.2

## 2022-05-02 HISTORY — DX: Cystocele, unspecified: N81.10

## 2022-05-02 HISTORY — DX: Atherosclerotic heart disease of native coronary artery without angina pectoris: I25.10

## 2022-05-02 HISTORY — DX: Other intervertebral disc degeneration, lumbar region: M51.36

## 2022-05-02 HISTORY — DX: Unspecified osteoarthritis, unspecified site: M19.90

## 2022-05-02 HISTORY — DX: Prolapse of vaginal vault after hysterectomy: N99.3

## 2022-05-02 SURGERY — ANTERIOR AND POSTERIOR REPAIR WITH SACROSPINOUS FIXATION
Anesthesia: General

## 2022-05-02 NOTE — Anesthesia Preprocedure Evaluation (Deleted)
Anesthesia Evaluation    Reviewed: Allergy & Precautions, Patient's Chart, lab work & pertinent test results, Unable to perform ROS - Chart review only  Airway        Dental   Pulmonary sleep apnea , former smoker          Cardiovascular hypertension, Pt. on medications + CAD and + DOE       Neuro/Psych  PSYCHIATRIC DISORDERS Anxiety Depression       GI/Hepatic Neg liver ROS,GERD  ,,  Endo/Other  diabetes    Renal/GU negative Renal ROS     Musculoskeletal  (+) Arthritis ,    Abdominal  (+) + obese  Peds  Hematology negative hematology ROS (+)   Anesthesia Other Findings   Reproductive/Obstetrics                              Anesthesia Physical Anesthesia Plan  ASA: 2  Anesthesia Plan: General   Post-op Pain Management: Tylenol PO (pre-op)* and Celebrex PO (pre-op)*   Induction: Intravenous  PONV Risk Score and Plan: 4 or greater and Ondansetron, Dexamethasone and Treatment may vary due to age or medical condition  Airway Management Planned: Oral ETT  Additional Equipment: None  Intra-op Plan:   Post-operative Plan: Extubation in OR  Informed Consent:   Plan Discussed with:   Anesthesia Plan Comments:          Anesthesia Quick Evaluation

## 2022-05-02 NOTE — Telephone Encounter (Signed)
Patient was relying on transportation to get her to the surgery center for her procedure today. But the [facility] broke down and they were supposed to send another one but they ended up cancelling the transport. Patient notified surgery center. Will have to reschedule her surgery and she understands this. Advised her that she can return to her regular diet.

## 2022-05-10 ENCOUNTER — Other Ambulatory Visit: Payer: Self-pay | Admitting: Family Medicine

## 2022-05-14 ENCOUNTER — Other Ambulatory Visit: Payer: Self-pay | Admitting: *Deleted

## 2022-05-14 DIAGNOSIS — E1169 Type 2 diabetes mellitus with other specified complication: Secondary | ICD-10-CM

## 2022-05-14 MED ORDER — TRUE METRIX BLOOD GLUCOSE TEST VI STRP: ORAL_STRIP | 1 refills | Status: DC

## 2022-05-14 MED ORDER — METFORMIN HCL 500 MG PO TABS: ORAL_TABLET | ORAL | 1 refills | Status: DC

## 2022-05-14 MED ORDER — ALCOHOL SWABS 70 % PADS: MEDICATED_PAD | 1 refills | Status: DC

## 2022-05-14 MED ORDER — TRUE METRIX AIR GLUCOSE METER W/DEVICE KIT: PACK | 0 refills | Status: DC

## 2022-05-14 MED ORDER — TRUEPLUS LANCETS 33G MISC: 1 refills | Status: DC

## 2022-05-28 ENCOUNTER — Encounter (HOSPITAL_BASED_OUTPATIENT_CLINIC_OR_DEPARTMENT_OTHER): Payer: Self-pay | Admitting: Obstetrics and Gynecology

## 2022-05-28 ENCOUNTER — Other Ambulatory Visit: Payer: Self-pay

## 2022-05-28 NOTE — Progress Notes (Signed)
Spoke w/ via phone for pre-op interview: patient   Lab needs dos: EKG and BMP Lab results: Echo 03/06/22 COVID test: patient states asymptomatic no test needed. Arrive at 0930 05/30/22. NPO after MN except clear liquids.Clear liquids from MN until 0830 Med rec completed. Medications to take morning of surgery: Lexapro and Pepcid Diabetic medication: NA Patient instructed no nail polish to be worn day of surgery. Patient instructed to bring photo id and insurance card day of surgery. Patient aware to have Driver (ride ) / caregiver for 24 hours after surgery. (Sister, Opal Sidles, to drive.) Patient Special Instructions: NA Pre-Op special Istructions: NA Patient verbalized understanding of instructions that were given at this phone interview. Patient denies shortness of breath, chest pain, fever, cough at this phone interview.

## 2022-05-30 ENCOUNTER — Telehealth: Payer: Self-pay | Admitting: Obstetrics and Gynecology

## 2022-05-30 ENCOUNTER — Ambulatory Visit (HOSPITAL_BASED_OUTPATIENT_CLINIC_OR_DEPARTMENT_OTHER): Payer: Medicare HMO

## 2022-05-30 ENCOUNTER — Other Ambulatory Visit: Payer: Self-pay

## 2022-05-30 ENCOUNTER — Other Ambulatory Visit: Payer: Self-pay | Admitting: Obstetrics and Gynecology

## 2022-05-30 ENCOUNTER — Encounter (HOSPITAL_BASED_OUTPATIENT_CLINIC_OR_DEPARTMENT_OTHER): Payer: Self-pay | Admitting: Obstetrics and Gynecology

## 2022-05-30 ENCOUNTER — Ambulatory Visit (HOSPITAL_BASED_OUTPATIENT_CLINIC_OR_DEPARTMENT_OTHER)
Admission: RE | Admit: 2022-05-30 | Discharge: 2022-05-30 | Disposition: A | Discharge status: Home / Self Care | Payer: Medicare HMO | Attending: Obstetrics and Gynecology | Admitting: Obstetrics and Gynecology

## 2022-05-30 ENCOUNTER — Encounter (HOSPITAL_BASED_OUTPATIENT_CLINIC_OR_DEPARTMENT_OTHER)
Admission: RE | Disposition: A | Discharge status: Home / Self Care | Payer: Self-pay | Source: Home / Self Care | Attending: Obstetrics and Gynecology

## 2022-05-30 DIAGNOSIS — I2510 Atherosclerotic heart disease of native coronary artery without angina pectoris: Secondary | ICD-10-CM | POA: Diagnosis not present

## 2022-05-30 DIAGNOSIS — G4730 Sleep apnea, unspecified: Secondary | ICD-10-CM | POA: Insufficient documentation

## 2022-05-30 DIAGNOSIS — N393 Stress incontinence (female) (male): Secondary | ICD-10-CM

## 2022-05-30 DIAGNOSIS — K219 Gastro-esophageal reflux disease without esophagitis: Secondary | ICD-10-CM | POA: Diagnosis not present

## 2022-05-30 DIAGNOSIS — I10 Essential (primary) hypertension: Secondary | ICD-10-CM | POA: Diagnosis not present

## 2022-05-30 DIAGNOSIS — N993 Prolapse of vaginal vault after hysterectomy: Secondary | ICD-10-CM

## 2022-05-30 DIAGNOSIS — Z09 Encounter for follow-up examination after completed treatment for conditions other than malignant neoplasm: Secondary | ICD-10-CM | POA: Insufficient documentation

## 2022-05-30 DIAGNOSIS — Z7984 Long term (current) use of oral hypoglycemic drugs: Secondary | ICD-10-CM | POA: Insufficient documentation

## 2022-05-30 DIAGNOSIS — Z01818 Encounter for other preprocedural examination: Secondary | ICD-10-CM

## 2022-05-30 DIAGNOSIS — Z87891 Personal history of nicotine dependence: Secondary | ICD-10-CM | POA: Insufficient documentation

## 2022-05-30 DIAGNOSIS — G8918 Other acute postprocedural pain: Secondary | ICD-10-CM

## 2022-05-30 DIAGNOSIS — E119 Type 2 diabetes mellitus without complications: Secondary | ICD-10-CM | POA: Diagnosis not present

## 2022-05-30 DIAGNOSIS — Z79899 Other long term (current) drug therapy: Secondary | ICD-10-CM

## 2022-05-30 DIAGNOSIS — N8110 Cystocele, unspecified: Secondary | ICD-10-CM | POA: Diagnosis not present

## 2022-05-30 HISTORY — PX: PERINEOPLASTY: SHX2218

## 2022-05-30 HISTORY — PX: BLADDER SUSPENSION: SHX72

## 2022-05-30 HISTORY — PX: ANTERIOR AND POSTERIOR REPAIR WITH SACROSPINOUS FIXATION: SHX6536

## 2022-05-30 HISTORY — PX: CYSTOSCOPY: SHX5120

## 2022-05-30 LAB — GLUCOSE, CAPILLARY
Glucose-Capillary: 124 mg/dL — ABNORMAL HIGH (ref 70–99)
Glucose-Capillary: 137 mg/dL — ABNORMAL HIGH (ref 70–99)

## 2022-05-30 SURGERY — ANTERIOR AND POSTERIOR REPAIR WITH SACROSPINOUS FIXATION
Anesthesia: General | Site: Vagina

## 2022-05-30 MED ORDER — OXYCODONE HCL 5 MG PO TABS: 5.0000 mg | ORAL_TABLET | ORAL | 0 refills | Status: DC | PRN

## 2022-05-30 MED ORDER — POVIDONE-IODINE 10 % EX SWAB: 2.0000 | Freq: Once | CUTANEOUS | Status: DC

## 2022-05-30 MED ORDER — FENTANYL CITRATE (PF) 100 MCG/2ML IJ SOLN
INTRAMUSCULAR | Status: AC
  Filled 2022-05-30: qty 2

## 2022-05-30 MED ORDER — GABAPENTIN 300 MG PO CAPS
ORAL_CAPSULE | ORAL | Status: AC
  Filled 2022-05-30: qty 1

## 2022-05-30 MED ORDER — LIDOCAINE-EPINEPHRINE 1 %-1:100000 IJ SOLN
INTRAMUSCULAR | Status: DC | PRN
  Administered 2022-05-30: 36 mL

## 2022-05-30 MED ORDER — STERILE WATER FOR IRRIGATION IR SOLN
Status: DC | PRN
  Administered 2022-05-30: 1000 mL

## 2022-05-30 MED ORDER — GABAPENTIN 300 MG PO CAPS
300.0000 mg | ORAL_CAPSULE | ORAL | Status: AC
  Administered 2022-05-30: 300 mg via ORAL

## 2022-05-30 MED ORDER — FLUORESCEIN SODIUM 10 % IV SOLN
INTRAVENOUS | Status: AC
  Filled 2022-05-30: qty 5

## 2022-05-30 MED ORDER — 0.9 % SODIUM CHLORIDE (POUR BTL) OPTIME
TOPICAL | Status: DC | PRN
  Administered 2022-05-30: 500 mL

## 2022-05-30 MED ORDER — ACETAMINOPHEN 500 MG PO TABS
ORAL_TABLET | ORAL | Status: AC
  Filled 2022-05-30: qty 2

## 2022-05-30 MED ORDER — PHENYLEPHRINE 80 MCG/ML (10ML) SYRINGE FOR IV PUSH (FOR BLOOD PRESSURE SUPPORT)
PREFILLED_SYRINGE | INTRAVENOUS | Status: DC | PRN
  Administered 2022-05-30: 160 ug via INTRAVENOUS
  Administered 2022-05-30: 80 ug via INTRAVENOUS
  Administered 2022-05-30 (×2): 160 ug via INTRAVENOUS
  Administered 2022-05-30 (×3): 80 ug via INTRAVENOUS

## 2022-05-30 MED ORDER — LIDOCAINE HCL (PF) 2 % IJ SOLN
INTRAMUSCULAR | Status: AC
  Filled 2022-05-30: qty 5

## 2022-05-30 MED ORDER — LIDOCAINE 2% (20 MG/ML) 5 ML SYRINGE
INTRAMUSCULAR | Status: DC | PRN
  Administered 2022-05-30: 80 mg via INTRAVENOUS

## 2022-05-30 MED ORDER — PHENAZOPYRIDINE HCL 100 MG PO TABS
ORAL_TABLET | ORAL | Status: AC
  Filled 2022-05-30: qty 1

## 2022-05-30 MED ORDER — FENTANYL CITRATE (PF) 100 MCG/2ML IJ SOLN
INTRAMUSCULAR | Status: DC | PRN
  Administered 2022-05-30 (×2): 50 ug via INTRAVENOUS

## 2022-05-30 MED ORDER — ACETAMINOPHEN 500 MG PO TABS
1000.0000 mg | ORAL_TABLET | ORAL | Status: AC
  Administered 2022-05-30: 1000 mg via ORAL

## 2022-05-30 MED ORDER — LACTATED RINGERS IV SOLN: INTRAVENOUS | Status: DC

## 2022-05-30 MED ORDER — PROPOFOL 10 MG/ML IV BOLUS
INTRAVENOUS | Status: DC | PRN
  Administered 2022-05-30: 160 mg via INTRAVENOUS
  Administered 2022-05-30: 40 mg via INTRAVENOUS

## 2022-05-30 MED ORDER — FENTANYL CITRATE (PF) 100 MCG/2ML IJ SOLN: 25.0000 ug | INTRAMUSCULAR | Status: DC | PRN

## 2022-05-30 MED ORDER — PHENAZOPYRIDINE HCL 100 MG PO TABS
ORAL_TABLET | ORAL | Status: AC
  Filled 2022-05-30: qty 2

## 2022-05-30 MED ORDER — CEFAZOLIN SODIUM-DEXTROSE 2-4 GM/100ML-% IV SOLN
INTRAVENOUS | Status: AC
  Filled 2022-05-30: qty 100

## 2022-05-30 MED ORDER — PHENYLEPHRINE 80 MCG/ML (10ML) SYRINGE FOR IV PUSH (FOR BLOOD PRESSURE SUPPORT)
PREFILLED_SYRINGE | INTRAVENOUS | Status: AC
  Filled 2022-05-30: qty 10

## 2022-05-30 MED ORDER — PHENAZOPYRIDINE HCL 100 MG PO TABS
200.0000 mg | ORAL_TABLET | ORAL | Status: AC
  Administered 2022-05-30: 200 mg via ORAL

## 2022-05-30 MED ORDER — ONDANSETRON HCL 4 MG/2ML IJ SOLN
INTRAMUSCULAR | Status: AC
  Filled 2022-05-30: qty 2

## 2022-05-30 MED ORDER — CEFAZOLIN SODIUM-DEXTROSE 2-4 GM/100ML-% IV SOLN
2.0000 g | INTRAVENOUS | Status: AC
  Administered 2022-05-30: 2 g via INTRAVENOUS

## 2022-05-30 MED ORDER — DEXAMETHASONE SODIUM PHOSPHATE 10 MG/ML IJ SOLN
INTRAMUSCULAR | Status: DC | PRN
  Administered 2022-05-30: 10 mg via INTRAVENOUS

## 2022-05-30 MED ORDER — ONDANSETRON HCL 4 MG/2ML IJ SOLN
INTRAMUSCULAR | Status: DC | PRN
  Administered 2022-05-30: 4 mg via INTRAVENOUS

## 2022-05-30 MED ORDER — PROPOFOL 10 MG/ML IV BOLUS
INTRAVENOUS | Status: AC
  Filled 2022-05-30: qty 20

## 2022-05-30 MED ORDER — SODIUM CHLORIDE 0.9 % IR SOLN
Status: DC | PRN
  Administered 2022-05-30: 300 mL via INTRAVESICAL

## 2022-05-30 SURGICAL SUPPLY — 48 items
ADH SKN CLS APL DERMABOND .7 (GAUZE/BANDAGES/DRESSINGS) ×3
AGENT HMST KT MTR STRL THRMB (HEMOSTASIS)
BLADE CLIPPER SENSICLIP SURGIC (BLADE) ×3 IMPLANT
BLADE SURG 15 STRL LF DISP TIS (BLADE) ×3 IMPLANT
BLADE SURG 15 STRL SS (BLADE) ×3
DERMABOND ADVANCED .7 DNX12 (GAUZE/BANDAGES/DRESSINGS) ×3 IMPLANT
DEVICE CAPIO SLIM SINGLE (INSTRUMENTS) IMPLANT
ELECT REM PT RETURN 9FT ADLT (ELECTROSURGICAL) ×3
ELECTRODE REM PT RTRN 9FT ADLT (ELECTROSURGICAL) IMPLANT
GAUZE 4X4 16PLY ~~LOC~~+RFID DBL (SPONGE) IMPLANT
GLOVE BIOGEL PI IND STRL 6.5 (GLOVE) ×3 IMPLANT
GLOVE BIOGEL PI IND STRL 7.0 (GLOVE) ×3 IMPLANT
GLOVE ECLIPSE 6.0 STRL STRAW (GLOVE) ×3 IMPLANT
GOWN STRL REUS W/TWL LRG LVL3 (GOWN DISPOSABLE) ×3 IMPLANT
HIBICLENS CHG 4% 4OZ BTL (MISCELLANEOUS) ×3 IMPLANT
HOLDER FOLEY CATH W/STRAP (MISCELLANEOUS) ×3 IMPLANT
IV NS 1000ML (IV SOLUTION) ×3
IV NS 1000ML BAXH (IV SOLUTION) IMPLANT
KIT TURNOVER CYSTO (KITS) ×3 IMPLANT
MANIFOLD NEPTUNE II (INSTRUMENTS) ×3 IMPLANT
NDL HYPO 22X1.5 SAFETY MO (MISCELLANEOUS) ×3 IMPLANT
NDL MAYO 6 CRC TAPER PT (NEEDLE) IMPLANT
NEEDLE HYPO 22X1.5 SAFETY MO (MISCELLANEOUS) ×3 IMPLANT
NEEDLE MAYO 6 CRC TAPER PT (NEEDLE) IMPLANT
NS IRRIG 1000ML POUR BTL (IV SOLUTION) ×3 IMPLANT
NS IRRIG 500ML POUR BTL (IV SOLUTION) IMPLANT
PACK CYSTO (CUSTOM PROCEDURE TRAY) ×3 IMPLANT
PACK VAGINAL WOMENS (CUSTOM PROCEDURE TRAY) ×3 IMPLANT
PAD OB MATERNITY 4.3X12.25 (PERSONAL CARE ITEMS) ×3 IMPLANT
RETRACTOR LONE STAR DISPOSABLE (INSTRUMENTS) ×3 IMPLANT
RETRACTOR STAY HOOK 5MM (MISCELLANEOUS) ×3 IMPLANT
SET IRRIG Y TYPE TUR BLADDER L (SET/KITS/TRAYS/PACK) ×3 IMPLANT
SLEEVE SCD COMPRESS KNEE MED (STOCKING) ×3 IMPLANT
SPIKE FLUID TRANSFER (MISCELLANEOUS) ×3 IMPLANT
SUCTION FRAZIER HANDLE 10FR (MISCELLANEOUS) ×3
SUCTION TUBE FRAZIER 10FR DISP (MISCELLANEOUS) ×3 IMPLANT
SURGIFLO W/THROMBIN 8M KIT (HEMOSTASIS) IMPLANT
SUT ABS MONO DBL WITH NDL 48IN (SUTURE) IMPLANT
SUT VIC AB 0 CT1 27 (SUTURE) ×3
SUT VIC AB 0 CT1 27XBRD ANBCTR (SUTURE) IMPLANT
SUT VIC AB 2-0 SH 27 (SUTURE) ×3
SUT VIC AB 2-0 SH 27XBRD (SUTURE) ×3 IMPLANT
SUT VICRYL 2-0 SH 8X27 (SUTURE) ×3 IMPLANT
SYR BULB EAR ULCER 3OZ GRN STR (SYRINGE) ×3 IMPLANT
SYS SLING ADV FIT BLUE TRNSVAG (Sling) IMPLANT
TOWEL OR 17X24 6PK STRL BLUE (TOWEL DISPOSABLE) ×3 IMPLANT
TRAY FOLEY W/BAG SLVR 14FR LF (SET/KITS/TRAYS/PACK) ×3 IMPLANT
WATER STERILE IRR 1000ML POUR (IV SOLUTION) IMPLANT

## 2022-05-30 NOTE — Progress Notes (Signed)
PACU NURSING NOTE: Void Trial - 336ml NS solution slowly instilled into bladder via current urinary drainage tube in place periop. Tolerated instillation very well. Urinary drainage tube then removed per procedure guidelines, client assisted to restroom. Client states she is unable to void, has a lot of urgency with mild pressure at bladder area, Bladder Scan performed x 3, avg amt noted 147ml's. Total IVF rec intra op and post op was 155ml's. MD called and informed of Void Trial results of less than 20mls of urine/blood. Per MD, reinsert 72fr Urinary Catheter and will follow up in MD office next week for re-evaluation. Order placed in chart.

## 2022-05-30 NOTE — Transfer of Care (Signed)
Immediate Anesthesia Transfer of Care Note  Patient: Brenda [name]  Procedure(s) Performed: ANTERIOR  REPAIR WITH SACROSPINOUS FIXATION (Vagina ) TRANSVAGINAL TAPE (TVT) PROCEDURE (Vagina ) CYSTOSCOPY (Bladder) PERINEOPLASTY (Vagina ) REPAIR OF ENTEROCELE (Vagina )  Patient Location: PACU  Anesthesia Type:General  Level of Consciousness: awake, alert , and oriented  Airway & Oxygen Therapy: Patient Spontanous Breathing and Patient connected to nasal cannula oxygen  Post-op Assessment: Report given to RN and Post -op Vital signs reviewed and stable  Post vital signs: Reviewed and stable  Last Vitals:  Vitals Value Taken Time  BP    Temp    Pulse 86 05/30/22 1312  Resp 16 05/30/22 1312  SpO2 91 % 05/30/22 1312  Vitals shown include unvalidated device data.  Last Pain:  Vitals:   05/30/22 0957  TempSrc: Oral  PainSc: 0-No pain      Patients Stated Pain Goal: 4 ([id number])  Complications: No notable events documented.

## 2022-05-30 NOTE — Anesthesia Preprocedure Evaluation (Addendum)
Anesthesia Evaluation  Patient identified by MRN, date of birth, ID band Patient awake    Reviewed: Allergy & Precautions, NPO status , Patient's Chart, lab work & pertinent test results  Airway Mallampati: II  TM Distance: >3 FB Neck ROM: Full  Mouth opening: Limited Mouth Opening  Dental no notable dental hx. (+) Teeth Intact, Dental Advisory Given   Pulmonary sleep apnea , former smoker   Pulmonary exam normal breath sounds clear to auscultation       Cardiovascular hypertension, + CAD  Normal cardiovascular exam Rhythm:Regular Rate:Normal  TTE 2024 1. Left ventricular ejection fraction, by estimation, is 60 to 65%. The  left ventricle has normal function. The left ventricle has no regional  wall motion abnormalities. Left ventricular diastolic parameters are  consistent with Grade I diastolic  dysfunction (impaired relaxation).   2. The aortic valve was not well visualized. Aortic valve regurgitation  is not visualized. Aortic valve sclerosis is present, with no evidence of  aortic valve stenosis.    Neuro/Psych  PSYCHIATRIC DISORDERS Anxiety Depression    negative neurological ROS     GI/Hepatic Neg liver ROS,GERD  ,,  Endo/Other  diabetes, Type 2, Oral Hypoglycemic Agents    Renal/GU negative Renal ROS  negative genitourinary   Musculoskeletal  (+) Arthritis ,    Abdominal   Peds  Hematology negative hematology ROS (+)   Anesthesia Other Findings   Reproductive/Obstetrics                             Anesthesia Physical Anesthesia Plan  ASA: 3  Anesthesia Plan: General   Post-op Pain Management: Tylenol PO (pre-op)*   Induction: Intravenous  PONV Risk Score and Plan: 3 and Ondansetron, Dexamethasone and Treatment may vary due to age or medical condition  Airway Management Planned: LMA  Additional Equipment:   Intra-op Plan:   Post-operative Plan: Extubation in  OR  Informed Consent: I have reviewed the patients History and Physical, chart, labs and discussed the procedure including the risks, benefits and alternatives for the proposed anesthesia with the patient or authorized representative who has indicated his/her understanding and acceptance.     Dental advisory given  Plan Discussed with: CRNA  Anesthesia Plan Comments:        Anesthesia Quick Evaluation

## 2022-05-30 NOTE — Anesthesia Procedure Notes (Signed)
Procedure Name: LMA Insertion Date/Time: 05/30/2022 11:39 AM  Performed by: Yazaira Speas D, CRNAPre-anesthesia Checklist: Patient identified, Emergency Drugs available, Suction available and Patient being monitored Patient Re-evaluated:Patient Re-evaluated prior to induction Oxygen Delivery Method: Circle system utilized Preoxygenation: Pre-oxygenation with 100% oxygen Induction Type: IV induction Ventilation: Mask ventilation without difficulty LMA: LMA inserted LMA Size: 4.0 Tube type: Oral Number of attempts: 1 Placement Confirmation: positive ETCO2 and breath sounds checked- equal and bilateral Tube secured with: Tape Dental Injury: Teeth and Oropharynx as per pre-operative assessment

## 2022-05-30 NOTE — Telephone Encounter (Signed)
Brenda [name] underwent anterior repair, sacrospinous fixation, perineorrhaphy, midurethral sling, cystoscopy on 05/30/22.   She failed her voiding trial.  370ml was backfilled into the bladder She was unable to void more than a few drops.   She was discharged with a catheter. Please call her for a routine post op check and to schedule a voiding trial on Monday 4/1. Thanks!  Jaquita Folds, MD

## 2022-05-30 NOTE — Discharge Instructions (Signed)

## 2022-05-30 NOTE — Op Note (Signed)
Operative Note  Preoperative Diagnosis: anterior vaginal prolapse, vaginal vault prolapse after hysterectomy, and stress urinary incontinence  Postoperative Diagnosis: same  Procedures performed:  Anterior repair, sacrospinous ligament fixation, perineorrhaphy, midurethral sling (Advantage Fit), cystoscopy  Implants:  Implant Name Type Inv. Item Serial No. Manufacturer Lot No. LRB No. Used Action  SYS SLING ADV FIT BLUE TRNSVAG - [id number] Sling SYS SLING ADV FIT BLUE TRNSVAG  [employer] [id number] N/A 1 Implanted    Attending Surgeon: Sherlene Shams, MD  Anesthesia: General LMA  Findings: 1. On vaginal exam, stage II anteroapical prolapse noted  2. On cystoscopy, normal bladder and urethra without injury, lesion or foreign body. Brisk bilateral ureteral efflux noted.    Specimens: none  Estimated blood loss: 50 mL  IV fluids: 1100 mL  Urine output: 50 mL  Complications: none  Procedure in Detail:  After informed consent was obtained, the patient was taken to the operating room where anesthesia was induced and found to be adequate. She was placed in dorsal lithotomy position, taking care to avoid any traction on the extremities, and then prepped and draped in the usual sterile fashion. A self-retaining lonestar retractor was placed using four elastic blue stays.  After a foley catheter was inserted into the urethra, the location of the midurethra was palpated. Two Allis clamps were along the anterior vaginal wall defect. 1% lidocaine with epinephrine was injected into the vaginal mucosa.  A vertical incision was made between these two Allis clamps with a 15 blade scalpel.  Allis clamps were placed along this incision and Metzenbaum scissors were used to undermine the vaginal mucosa along the incision.  The vaginal mucosa was then sharply dissected off to the vesicovaginal septum bilaterally to the level of the pubic rami.  The peritoneum was inadvertently entered at the vaginal  apex. A pursestring suture with 2-0 vicryl was used to close the enterocele sac. An additional pursesting was performed to reduced the enterocele bulge.   For the sacrospinous ligament fixation (SSLF), the ischial spine was accessed on the right side via dissection with Mayo scissors and blunt dissection.  The sacrospinous ligament was palpated. Two 0 PDS suture was then placed at the sacrospinous ligament two fingerbreadths medial to the ischial spine, in order to avoid the pudendal neurovascular bundle, using a Capio needle driver.  The PDS suture was attached to the vaginal epithelium on the ipsilateral side of the vaginal apex and held. Anterior plication of the vesicovaginal septum was then performed using plicating sutures of 2-0 Vicryl. The vaginal mucosal edges were trimmed and the incision reapproximated with 2-0 Vicryl in a running fashion. The SSLF suture was then tied down with excellent support of the anterior and apical vagina.  The retropubic midurethral sling was performed next. Two Allis clamps were placed lateral to the location of the midurethra. 1% lidocaine with epinephrine was injected into the vaginal mucosa and laterally.  Using a 15 blade scapel a small vertical incision was made in the vaginal mucosa. Metzenbaum scissors were used to dissect the vaginal mucosa off of the underlying muscularis and to create the periurethral tunnels. The trocar and attached sling were introduced into the right side of the vaginal incision, just inferior to the pubic symphysis on the right side. The trocar was guided through the endopelvic fascia and directly behind the pubic symphysis through the retropubic space, vertically.  The trocar was guided out through the suprapubic region, 2 fingerbreadths lateral to midline at the level of the pubic symphysis on  the ipsilateral side. The trocar was similarly placed on the left side.  The Foley catheter was removed.  A 70-degree cystoscope was introduced, and  360-degree inspection revealed no trauma or trocars in the bladder, with bilateral ureteral efflux.  The bladder was drained and the cystoscope was removed.  The Foley catheter was reinserted. The sling was brought to lie beneath the mid-urethra.  An empty needle driver was placed behind the sling to ensure a tension free placement. The trocars were removed.  The plastic sheath was removed from the sling and the distal ends of the sling were trimmed just below the level of the skin incisions. The skin incisions were closed with dermabond.  Hemostasis was noted.  Tension-free positioning of the sling was confirmed.  The vaginal incision was closed with 2-0 vicryl.  Attention was then turned to the perineum. Two allis clamps were placed at the introitus. The perineum was injected with 1% lidocaine with epinephrine. A diamond shaped incision was made over the perineum and excess skin was removed. Dissection was performed with Metzenbaum scissors to separate the mucosa from the underlying tissue. The perineal body was then reapproximated with three interrupted 0-vicryl sutures. The perineal skin was then closed with a 2-0 vicryl in a subcutaneous and running fashion. Irrigation was performed and good hemostasis was noted. The patient tolerated the procedure well.  She was awakened from anesthesia and transferred to the recovery room in stable condition. All counts were correct x 2.    Brenda Folds, MD

## 2022-05-30 NOTE — H&P (Signed)
[facility] H&P  Subjective Chief Complaint: BURNELL HARTL presents for a preoperative encounter.   History of Present Illness: TKIA BOLERJACK is a 77 y.o. female who presents for preoperative visit.  She is scheduled to undergo Exam under anesthesia, anterior repair, sacrospinous fixation, midurethral sling, cystoscopy  on 05/30/22.  Her symptoms include prolapse of anterior vaginal wall, vaginal vault prolapse after hysterectomy, SUI, UUI, and she was was found to have Stage II anterior, Stage I posterior, Stage I apical prolapse.   Urodynamics showed: 1. Sensation was reduced; capacity was reduced 2. Stress Incontinence was demonstrated at normal pressures; 3. Detrusor Overactivity was demonstrated with leakage. 4. Emptying was normal with a normal PVR, a sustained detrusor contraction present,  abdominal straining present, normal urethral sphincter activity on EMG.  Past Medical History:  Diagnosis Date   Anxiety 10/17/2010   Carpal tunnel syndrome on right 01/20/2012   Coronary artery calcification seen on CAT scan    cardiologist--- dr Geraldo Pitter   DDD (degenerative disc disease), lumbar    Depression    DOE (dyspnea on exertion)    GERD (gastroesophageal reflux disease)    History of obstructive sleep apnea    (04-29-2022  per pt was retested , no issue)  study in epic 05-01-2014 by dr Claiborne Billings [facility] 16.9 / hr w/ nocturnal oxygen saturation   History of uterine fibroid    Hyperlipidemia, mixed    2011   Hypertension    OA (osteoarthritis)    Osteopenia 03/19/2016   Prolapse of anterior vaginal wall    Prolapse of vaginal vault after hysterectomy    Seasonal allergies    SUI (stress urinary incontinence, female) 08/24/2012     Past Surgical History:  Procedure Laterality Date   COLONOSCOPY  2018   TOTAL VAGINAL HYSTERECTOMY  2004   @[facility] ;   W/   BILATERAL SALPINOOPHORECTOMY AND SLING PROCEDURE    is allergic to meloxicam.   Family History  Problem Relation  Age of Onset   Dementia Mother    Heart disease Father 35   Hypertension Father    Colon cancer Sister 41   Dementia Sister    Breast cancer Sister 59   Thyroid disease Sister    Depression Sister        anxiety   Obesity Daughter    Cancer Paternal Grandfather        bone    Social History   Tobacco Use   Smoking status: Former    Years: 63    Types: Cigarettes    Quit date: 1975    Years since quitting: 49.2   Smokeless tobacco: Never  Vaping Use   Vaping Use: Never used  Substance Use Topics   Alcohol use: Not Currently    Comment: very rare   Drug use: Never     Review of Systems was negative for a full 10 system review except as noted in the History of Present Illness.   Current Facility-Administered Medications:    ceFAZolin (ANCEF) IVPB 2g/100 mL premix, 2 g, Intravenous, On Call to OR, Jaquita Folds, MD   lactated ringers infusion, , Intravenous, Continuous, Lynda Rainwater, MD, Last Rate: 10 mL/hr at 05/30/22 1021, Continued from Pre-op at 05/30/22 1021   povidone-iodine 10 % swab 2 Application, 2 Application, Topical, Once, Jaquita Folds, MD   Objective Vitals:   05/30/22 0957  BP: (!) 152/89  Pulse: 80  Resp: 17  Temp: 97.7 F (36.5 C)  SpO2:  96%    Gen: NAD CV: S1 S2 RRR Lungs: Clear to auscultation bilaterally Abd: soft, nontender   Previous Pelvic Exam showed: Pelvic Exam: Normal external female genitalia; Bartholin's and Skene's glands normal in appearance; urethral meatus normal in appearance, no urethral masses or discharge.    CST: negative   s/p hysterectomy: Speculum exam reveals normal vaginal mucosa with  atrophy and normal vaginal cuff.  Adnexa no mass, fullness, tenderness.       Pelvic floor strength II/V   Pelvic floor musculature: Right levator non-tender, Right obturator non-tender, Left levator non-tender, Left obturator non-tender   POP-Q:    POP-Q   0                                             Aa   0                                           Ba   -5                                              C    4                                            Gh   4                                            Pb   8.5                                            tvl    -2.5                                            Ap   -2.5                                            Bp                                                  D          Rectal Exam:  Normal external rectum    Assessment/ Plan  Assessment: The patient is a 77 y.o. year old scheduled to undergo Exam under anesthesia, anterior repair, sacrospinous fixation, midurethral sling, cystoscopy. Verbal consent was obtained for these procedures. Jaquita Folds, MD

## 2022-05-31 NOTE — Anesthesia Postprocedure Evaluation (Signed)
Anesthesia Post Note  Patient: Brenda [name]  Procedure(s) Performed: ANTERIOR  REPAIR WITH SACROSPINOUS FIXATION (Vagina ) TRANSVAGINAL TAPE (TVT) PROCEDURE (Vagina ) CYSTOSCOPY (Bladder) PERINEOPLASTY (Vagina ) REPAIR OF ENTEROCELE (Vagina )     Patient location during evaluation: PACU Anesthesia Type: General Level of consciousness: awake and alert Pain management: pain level controlled Vital Signs Assessment: post-procedure vital signs reviewed and stable Respiratory status: spontaneous breathing, nonlabored ventilation, respiratory function stable and patient connected to nasal cannula oxygen Cardiovascular status: blood pressure returned to baseline and stable Postop Assessment: no apparent nausea or vomiting Anesthetic complications: no  No notable events documented.  Last Vitals:  Vitals:   05/30/22 1350 05/30/22 1540  BP: (!) 164/82 (!) 156/80  Pulse: 86 84  Resp: 18 18  Temp: 36.5 C 36.6 C  SpO2: 98% 99%    Last Pain:  Vitals:   05/30/22 1540  TempSrc: Oral  PainSc: 1    Pain Goal: Patients Stated Pain Goal: 4 (05/30/22 0957)                 Galileo Colello L Dameka Younker

## 2022-06-03 ENCOUNTER — Encounter (HOSPITAL_BASED_OUTPATIENT_CLINIC_OR_DEPARTMENT_OTHER): Payer: Self-pay | Admitting: Obstetrics and Gynecology

## 2022-06-03 ENCOUNTER — Ambulatory Visit (INDEPENDENT_AMBULATORY_CARE_PROVIDER_SITE_OTHER): Payer: Medicare HMO

## 2022-06-03 DIAGNOSIS — Z9889 Other specified postprocedural states: Secondary | ICD-10-CM

## 2022-06-03 NOTE — Progress Notes (Signed)
Urogyn Nurse Voiding Trial Note  Brenda [name] underwent ANTERIOR  REPAIR WITH SACROSPINOUS FIXATION (Vagina )  TRANSVAGINAL TAPE (TVT) PROCEDURE (Vagina )  CYSTOSCOPY (Bladder)  PERINEOPLASTY (Vagina )  REPAIR OF ENTEROCELE (Vagina ) on 05/30/2022.  She presents today for a voiding trial .   Patient was identified with 2 indicators. 358ml of NS was instilled into the bladder via the catheter. The catheter was removed and patient was instructed to void into the urinary hat. She voided 357ml. The post void residual measured by bladder scan was 6ml. She passed the voiding trial and a catheter was not replaced.   The patient received aftercare instructions and will follow up as scheduled.

## 2022-06-03 NOTE — Patient Instructions (Signed)
Please keep the next 2 post op appointment

## 2022-06-04 NOTE — Telephone Encounter (Signed)
Called and spoke to patient Patient reports she is urinating well and is not leaking. She had catheter removed yesterday and is doing well.  Patient reports she is having small amount of bowel movements and has taken her Miralax today and yesterday.  Reports her pain is in her back and buttock region. She reports she has been taking tylenol and ibuprofen. Has not been taking the narcotic.  Denies bleeding at this time.  She reports she has done some of the exercises and felt a little tired so took a break.  Overall she feels she is doing well and has our number to call if she needs anything.

## 2022-06-14 ENCOUNTER — Ambulatory Visit (INDEPENDENT_AMBULATORY_CARE_PROVIDER_SITE_OTHER): Payer: Medicare HMO | Admitting: Obstetrics and Gynecology

## 2022-06-14 ENCOUNTER — Encounter: Payer: Self-pay | Admitting: Obstetrics and Gynecology

## 2022-06-14 VITALS — BP 132/66 | HR 96

## 2022-06-14 DIAGNOSIS — R6889 Other general symptoms and signs: Secondary | ICD-10-CM | POA: Diagnosis not present

## 2022-06-14 DIAGNOSIS — Z9889 Other specified postprocedural states: Secondary | ICD-10-CM

## 2022-06-14 DIAGNOSIS — N952 Postmenopausal atrophic vaginitis: Secondary | ICD-10-CM

## 2022-06-14 MED ORDER — ESTRADIOL 0.1 MG/GM VA CREA: TOPICAL_CREAM | VAGINAL | 11 refills | Status: DC

## 2022-06-14 NOTE — Patient Instructions (Signed)
Start vaginal estrogen therapy nightly for two weeks then 2 times weekly at night for treatment of vaginal atrophy (dryness of the vaginal tissues).  Please let us know if the prescription is too expensive and we can look for alternative options.   

## 2022-06-14 NOTE — Progress Notes (Signed)
[facility]  Date of Visit: 06/14/2022  History of Present Illness: [name] is a 77 y.o. female scheduled today for a post-operative visit.   Surgery: s/p Anterior repair, sacrospinous ligament fixation, perineorrhaphy, midurethral sling (Advantage Fit), cystoscopy on 05/30/22  She did not pass her postoperative void trial. Returned 06/03/22 and passed voiding trial.   Postoperative course has been uncomplicated.   Today she reports pain has been well controlled. Has some arthritis in the back which has been acting up. Has some fatigue. Has a small amount of urinary leakage on the way to the bathroom in the middle of the night, but this has improved greatly, only has a small dribble. Having normal bowel movements, using the miralax.    Medications: She has a current medication list which includes the following prescription(s): [START ON 06/17/2022] estradiol, acetaminophen, acetaminophen, alcohol swabs, aspirin, atorvastatin, blood glucose meter kit and supplies, true metrix air glucose meter, celecoxib, cholecalciferol, cyanocobalamin, escitalopram, famotidine, gabapentin, true metrix blood glucose test, ibuprofen, irbesartan, krill oil, metformin, multiple vitamins-minerals, oxycodone, polyethylene glycol powder, probiotic daily, tramadol, triamterene-hydrochlorothiazide, trueplus lancets 33g, and turmeric.   Allergies: Patient is allergic to meloxicam.   Physical Exam: BP 132/66   Pulse 96    Suprapubic Incisions: healing well.  Pelvic Examination: Vagina: Incisions healing well. Sutures are present at incision line and there is not granulation tissue. No tenderness along the anterior or posterior vagina. No apical tenderness. No pelvic masses. No visible or palpable mesh.  POP-Q: deferred  ---------------------------------------------------------  Assessment and Plan:  1. Post-operative state   2. Vaginal atrophy     - Healing well but will start vaginal estrogen  0.5g nightly for two weeks then twice a week after.  - continue with pelvic rest and lifting restrictions - will repeat pop-q at post op visit.   Marguerita Beards, MD

## 2022-07-03 ENCOUNTER — Encounter: Payer: Self-pay | Admitting: Obstetrics and Gynecology

## 2022-07-03 ENCOUNTER — Ambulatory Visit (INDEPENDENT_AMBULATORY_CARE_PROVIDER_SITE_OTHER): Payer: Medicare HMO | Admitting: Obstetrics and Gynecology

## 2022-07-03 VITALS — BP 133/73 | HR 99

## 2022-07-03 DIAGNOSIS — R6889 Other general symptoms and signs: Secondary | ICD-10-CM | POA: Diagnosis not present

## 2022-07-03 DIAGNOSIS — N3281 Overactive bladder: Secondary | ICD-10-CM

## 2022-07-03 DIAGNOSIS — Z9889 Other specified postprocedural states: Secondary | ICD-10-CM

## 2022-07-03 MED ORDER — MIRABEGRON ER 25 MG PO TB24: 25.0000 mg | ORAL_TABLET | Freq: Every day | ORAL | 5 refills | Status: DC

## 2022-07-03 NOTE — Progress Notes (Signed)
[facility]  Date of Visit: 07/03/2022  History of Present Illness: Ms. Gaultney is a 77 y.o. female scheduled today for a post-operative visit.   Surgery: s/p Surgery: s/p Anterior repair, sacrospinous ligament fixation, perineorrhaphy, midurethral sling (Advantage Fit), cystoscopy on 05/30/22  She did not pass her postoperative void trial. Returned 06/03/22 and passed voiding trial.   Postoperative course has been uncomplicated.    Today she reports she has more back pain on the left. Has improved slightly. Taking celebrex. Was having some back issues prior to the surgery as well.   She is using the vaginal estrogen cream and it is working well for her.   UTI in the last 6 weeks? No  Pain?  Just in the back She has returned to her normal activity (except for postop restrictions) Vaginal bulge? No  Stress incontinence: No  Urgency/frequency: No  Urge incontinence: Yes - usually in the morning on the way to the bathroom, but has improved.  Voiding dysfunction: No  Bowel issues: No   Subjective Success: Do you usually have a bulge or something falling out that you can see or feel in the vaginal area? No  Retreatment Success: Any retreatment with surgery or pessary for any compartment? No   Medications: She has a current medication list which includes the following prescription(s): acetaminophen, acetaminophen, alcohol swabs, aspirin, atorvastatin, blood glucose meter kit and supplies, true metrix air glucose meter, celecoxib, cholecalciferol, cyanocobalamin, escitalopram, estradiol, famotidine, gabapentin, true metrix blood glucose test, ibuprofen, irbesartan, krill oil, metformin, mirabegron er, multiple vitamins-minerals, oxycodone, polyethylene glycol powder, probiotic daily, tramadol, triamterene-hydrochlorothiazide, trueplus lancets 33g, and turmeric.   Allergies: Patient is allergic to meloxicam.   Physical Exam: BP 133/73   Pulse 99    Suprapubic Incisions: healing  well.  Pelvic Examination: Vagina: Incisions healing well. Sutures are present at the perineal incision line and the apex and there is not granulation tissue. No tenderness along the anterior or posterior vagina. No apical tenderness. No pelvic masses. No visible or palpable mesh.  POP-Q: POP-Q  -3                                            Aa   -3                                           Ba  -9                                              C   3                                            Gh  4                                            Pb  9.5  tvl   -2                                            Ap  -2                                            Bp                                                 D     ---------------------------------------------------------  Assessment and Plan:  1. Post-operative state   2. Overactive bladder     - Well healed. Continue with vaginal estrogen twice a week - Can resume regular activity including exercise and intercourse,  if desired.  - Discussed avoidance of heavy lifting and straining long term to reduce the risk of recurrence.  - For OAB symptoms, she is interested in starting medication since has to wear large pads at night. Will prescribe Myrbetriq 25mg  daily. Samples provided.  - She will follow up with her PCP regarding her back pain.   Follow up 6 weeks for medication check.   Marguerita Beards, MD

## 2022-08-09 DIAGNOSIS — M4316 Spondylolisthesis, lumbar region: Secondary | ICD-10-CM | POA: Diagnosis not present

## 2022-08-09 DIAGNOSIS — M47816 Spondylosis without myelopathy or radiculopathy, lumbar region: Secondary | ICD-10-CM | POA: Diagnosis not present

## 2022-08-09 DIAGNOSIS — M5136 Other intervertebral disc degeneration, lumbar region: Secondary | ICD-10-CM | POA: Diagnosis not present

## 2022-08-15 ENCOUNTER — Ambulatory Visit: Payer: Medicare HMO | Admitting: Obstetrics and Gynecology

## 2022-08-30 ENCOUNTER — Ambulatory Visit (INDEPENDENT_AMBULATORY_CARE_PROVIDER_SITE_OTHER): Payer: Medicare HMO | Admitting: *Deleted

## 2022-08-30 VITALS — BP 147/85 | HR 80 | Ht 67.0 in | Wt 217.4 lb

## 2022-08-30 DIAGNOSIS — Z0000 Encounter for general adult medical examination without abnormal findings: Secondary | ICD-10-CM | POA: Diagnosis not present

## 2022-08-30 NOTE — Patient Instructions (Signed)
[name] , Thank you for taking time to come for your Medicare Wellness Visit. I appreciate your ongoing commitment to your health goals. Please review the following plan we discussed and let me know if I can assist you in the future.     This is a list of the screening recommended for you and due dates:  Health Maintenance  Topic Date Due   Zoster (Shingles) Vaccine (1 of 2) 10/19/1964   Complete foot exam   03/26/2016   DTaP/Tdap/Td vaccine (2 - Td or Tdap) 04/16/2021   Colon Cancer Screening  04/22/2021   COVID-19 Vaccine (6 - 2023-24 season) 02/13/2022   Eye exam for diabetics  05/12/2022   Yearly kidney health urinalysis for diabetes  05/30/2022   Hemoglobin A1C  08/09/2022   Flu Shot  10/03/2022   Mammogram  12/11/2022   Yearly kidney function blood test for diabetes  02/08/2023   Medicare Annual Wellness Visit  08/30/2023   Pneumonia Vaccine  Completed   DEXA scan (bone density measurement)  Completed   Hepatitis C Screening  Completed   HPV Vaccine  Aged Out    Next appointment: Follow up in one year for your annual wellness visit.   Preventive Care 77 Years and Older, Female Preventive care refers to lifestyle choices and visits with your health care provider that can promote health and wellness. What does preventive care include? A yearly physical exam. This is also called an annual well check. Dental exams once or twice a year. Routine eye exams. Ask your health care provider how often you should have your eyes checked. Personal lifestyle choices, including: Daily care of your teeth and gums. Regular physical activity. Eating a healthy diet. Avoiding tobacco and drug use. Limiting alcohol use. Practicing safe sex. Taking low-dose aspirin every day. Taking vitamin and mineral supplements as recommended by your health care provider. What happens during an annual well check? The services and screenings done by your health care provider during your annual well  check will depend on your age, overall health, lifestyle risk factors, and family history of disease. Counseling  Your health care provider may ask you questions about your: Alcohol use. Tobacco use. Drug use. Emotional well-being. Home and relationship well-being. Sexual activity. Eating habits. History of falls. Memory and ability to understand (cognition). Work and work Astronomer. Reproductive health. Screening  You may have the following tests or measurements: Height, weight, and BMI. Blood pressure. Lipid and cholesterol levels. These may be checked every 5 years, or more frequently if you are over 63 years old. Skin check. Lung cancer screening. You may have this screening every year starting at age 60 if you have a 30-pack-year history of smoking and currently smoke or have quit within the past 15 years. Fecal occult blood test (FOBT) of the stool. You may have this test every year starting at age 26. Flexible sigmoidoscopy or colonoscopy. You may have a sigmoidoscopy every 5 years or a colonoscopy every 10 years starting at age 55. Hepatitis C blood test. Hepatitis B blood test. Sexually transmitted disease (STD) testing. Diabetes screening. This is done by checking your blood sugar (glucose) after you have not eaten for a while (fasting). You may have this done every 1-3 years. Bone density scan. This is done to screen for osteoporosis. You may have this done starting at age 58. Mammogram. This may be done every 1-2 years. Talk to your health care provider about how often you should have regular mammograms. Talk with  your health care provider about your test results, treatment options, and if necessary, the need for more tests. Vaccines  Your health care provider may recommend certain vaccines, such as: Influenza vaccine. This is recommended every year. Tetanus, diphtheria, and acellular pertussis (Tdap, Td) vaccine. You may need a Td booster every 10 years. Zoster  vaccine. You may need this after age 10. Pneumococcal 13-valent conjugate (PCV13) vaccine. One dose is recommended after age 9. Pneumococcal polysaccharide (PPSV23) vaccine. One dose is recommended after age 77. Talk to your health care provider about which screenings and vaccines you need and how often you need them. This information is not intended to replace advice given to you by your health care provider. Make sure you discuss any questions you have with your health care provider. Document Released: 03/17/2015 Document Revised: 11/08/2015 Document Reviewed: 12/20/2014 Elsevier Interactive Patient Education  2017 [place] Prevention in the Home Falls can cause injuries. They can happen to people of all ages. There are many things you can do to make your home safe and to help prevent falls. What can I do on the outside of my home? Regularly fix the edges of walkways and driveways and fix any cracks. Remove anything that might make you trip as you walk through a door, such as a raised step or threshold. Trim any bushes or trees on the path to your home. Use bright outdoor lighting. Clear any walking paths of anything that might make someone trip, such as rocks or tools. Regularly check to see if handrails are loose or broken. Make sure that both sides of any steps have handrails. Any raised decks and porches should have guardrails on the edges. Have any leaves, snow, or ice cleared regularly. Use sand or salt on walking paths during winter. Clean up any spills in your garage right away. This includes oil or grease spills. What can I do in the bathroom? Use night lights. Install grab bars by the toilet and in the tub and shower. Do not use towel bars as grab bars. Use non-skid mats or decals in the tub or shower. If you need to sit down in the shower, use a plastic, non-slip stool. Keep the floor dry. Clean up any water that spills on the floor as soon as it happens. Remove  soap buildup in the tub or shower regularly. Attach bath mats securely with double-sided non-slip rug tape. Do not have throw rugs and other things on the floor that can make you trip. What can I do in the bedroom? Use night lights. Make sure that you have a light by your bed that is easy to reach. Do not use any sheets or blankets that are too big for your bed. They should not hang down onto the floor. Have a firm chair that has side arms. You can use this for support while you get dressed. Do not have throw rugs and other things on the floor that can make you trip. What can I do in the kitchen? Clean up any spills right away. Avoid walking on wet floors. Keep items that you use a lot in easy-to-reach places. If you need to reach something above you, use a strong step stool that has a grab bar. Keep electrical cords out of the way. Do not use floor polish or wax that makes floors slippery. If you must use wax, use non-skid floor wax. Do not have throw rugs and other things on the floor that can make you trip.  What can I do with my stairs? Do not leave any items on the stairs. Make sure that there are handrails on both sides of the stairs and use them. Fix handrails that are broken or loose. Make sure that handrails are as long as the stairways. Check any carpeting to make sure that it is firmly attached to the stairs. Fix any carpet that is loose or worn. Avoid having throw rugs at the top or bottom of the stairs. If you do have throw rugs, attach them to the floor with carpet tape. Make sure that you have a light switch at the top of the stairs and the bottom of the stairs. If you do not have them, ask someone to add them for you. What else can I do to help prevent falls? Wear shoes that: Do not have high heels. Have rubber bottoms. Are comfortable and fit you well. Are closed at the toe. Do not wear sandals. If you use a stepladder: Make sure that it is fully opened. Do not climb a  closed stepladder. Make sure that both sides of the stepladder are locked into place. Ask someone to hold it for you, if possible. Clearly mark and make sure that you can see: Any grab bars or handrails. First and last steps. Where the edge of each step is. Use tools that help you move around (mobility aids) if they are needed. These include: Canes. Walkers. Scooters. Crutches. Turn on the lights when you go into a dark area. Replace any light bulbs as soon as they burn out. Set up your furniture so you have a clear path. Avoid moving your furniture around. If any of your floors are uneven, fix them. If there are any pets around you, be aware of where they are. Review your medicines with your doctor. Some medicines can make you feel dizzy. This can increase your chance of falling. Ask your doctor what other things that you can do to help prevent falls. This information is not intended to replace advice given to you by your health care provider. Make sure you discuss any questions you have with your health care provider. Document Released: 12/15/2008 Document Revised: 07/27/2015 Document Reviewed: 03/25/2014 Elsevier Interactive Patient Education  2017 [employer].

## 2022-08-30 NOTE — Progress Notes (Signed)
Subjective:   Brenda [name] is a 77 y.o. female who presents for Medicare Annual (Subsequent) preventive examination.  Visit Complete: In person   Review of Systems     Cardiac Risk Factors include: advanced age (>74men, >8 women);obesity (BMI >30kg/m2);diabetes mellitus;dyslipidemia;hypertension     Objective:    Today's Vitals   08/30/22 1331 08/30/22 1404  BP: (!) 146/79 (!) 147/85  Pulse: 92 80  Weight: 217 lb 6.4 oz (98.6 kg)   Height: 5\' 7"  (1.702 m)    Body mass index is 34.05 kg/m.     08/30/2022    1:37 PM 05/30/2022    9:55 AM 08/28/2021    1:48 PM 08/23/2020    2:29 PM 05/24/2019    1:16 PM 05/18/2018    3:27 PM 03/29/2016    1:22 PM  Advanced Directives  Does Patient Have a Medical Advance Directive? No No No No No Yes No  Does patient want to make changes to medical advance directive?      Yes (MAU/Ambulatory/Procedural Areas - Information given)   Would patient like information on creating a medical advance directive? No - Patient declined No - Patient declined No - Patient declined Yes (MAU/Ambulatory/Procedural Areas - Information given) No - Patient declined  Yes (MAU/Ambulatory/Procedural Areas - Information given)    Current Medications (verified) Outpatient Encounter Medications as of 08/30/2022  Medication Sig   acetaminophen (TYLENOL) 500 MG tablet Take 1 tablet (500 mg total) by mouth every 6 (six) hours as needed (pain).   acetaminophen (TYLENOL) 650 MG CR tablet Take 1,300 mg by mouth daily.   Alcohol Swabs 70 % PADS Use as directed once a day.   aspirin 81 MG tablet Take 81 mg by mouth daily.   atorvastatin (LIPITOR) 80 MG tablet TAKE 1 TABLET EVERY DAY (Patient taking differently: Take 80 mg by mouth daily.)   blood glucose meter kit and supplies Dispense based on patient and insurance preference. Use Prn to check blood suagr   Blood Glucose Monitoring Suppl (TRUE METRIX AIR GLUCOSE METER) w/Device KIT Use to check glucose once a day.  Dx code:  E11.9   celecoxib (CELEBREX) 100 MG capsule Take 100 mg by mouth daily.   Cholecalciferol (VITAMIN D-3 PO) Take by mouth daily.   Cyanocobalamin (VITAMIN B 12 PO) Take 1,000 Units by mouth daily.   escitalopram (LEXAPRO) 20 MG tablet TAKE 2 TABLETS EVERY DAY   estradiol (ESTRACE) 0.1 MG/GM vaginal cream Place 0.5g nightly for two weeks then twice a week after   famotidine (PEPCID) 40 MG tablet TAKE 1 TABLET EVERY DAY (Patient taking differently: Take 40 mg by mouth daily.)   gabapentin (NEURONTIN) 100 MG capsule Take 1 capsule (100 mg total) by mouth 3 (three) times daily.   glucose blood (TRUE METRIX BLOOD GLUCOSE TEST) test strip Use to check glucose once a day.  Dx code: E11.9   ibuprofen (ADVIL) 600 MG tablet Take 1 tablet (600 mg total) by mouth every 6 (six) hours as needed.   irbesartan (AVAPRO) 150 MG tablet TAKE 1 TABLET EVERY DAY (Patient taking differently: Take 150 mg by mouth daily.)   KRILL OIL OMEGA-3 PO Take by mouth daily.   metFORMIN (GLUCOPHAGE) 500 MG tablet TAKE 1 TABLET BY MOUTH ONCE DAILY BEFORE  LUNCH   mirabegron ER (MYRBETRIQ) 25 MG TB24 tablet Take 1 tablet (25 mg total) by mouth daily.   Multiple Vitamins-Minerals (ALIVE WOMENS 50+ PO) Take by mouth daily.   polyethylene glycol powder (GLYCOLAX/MIRALAX)  17 GM/SCOOP powder Take 17 g by mouth daily. Drink 17g (1 scoop) dissolved in water per day.   Probiotic Product (PROBIOTIC DAILY) CAPS Take 1 capsule by mouth daily.   traMADol (ULTRAM) 50 MG tablet Take 1 tablet by mouth every 6 (six) hours as needed for moderate pain or severe pain.   triamterene-hydrochlorothiazide (MAXZIDE-25) 37.5-25 MG tablet TAKE 1 TABLET EVERY DAY (Patient taking differently: Take 1 tablet by mouth daily.)   TRUEplus Lancets 33G MISC Use to check glucose once a day.  Dx code: E11.9   TURMERIC PO Take 1 capsule by mouth daily.   [DISCONTINUED] oxyCODONE (OXY IR/ROXICODONE) 5 MG immediate release tablet Take 1 tablet (5 mg total) by mouth every  4 (four) hours as needed for severe pain.   No facility-administered encounter medications on file as of 08/30/2022.    Allergies (verified) Meloxicam   History: Past Medical History:  Diagnosis Date   Anxiety 10/17/2010   Carpal tunnel syndrome on right 01/20/2012   Coronary artery calcification seen on CAT scan    cardiologist--- dr Tomie China   DDD (degenerative disc disease), lumbar    Depression    DOE (dyspnea on exertion)    GERD (gastroesophageal reflux disease)    History of obstructive sleep apnea    (04-29-2022  per pt was retested , no issue)  study in epic 05-01-2014 by dr Tresa Endo [facility] 16.9 / hr w/ nocturnal oxygen saturation   History of uterine fibroid    Hyperlipidemia, mixed    2011   Hypertension    OA (osteoarthritis)    Osteopenia 03/19/2016   Prolapse of anterior vaginal wall    Prolapse of vaginal vault after hysterectomy    Seasonal allergies    SUI (stress urinary incontinence, female) 08/24/2012   Past Surgical History:  Procedure Laterality Date   ANTERIOR AND POSTERIOR REPAIR WITH SACROSPINOUS FIXATION N/A 05/30/2022   Procedure: ANTERIOR  REPAIR WITH SACROSPINOUS FIXATION;  Surgeon: Marguerita Beards, MD;  Location: [facility];  Service: Gynecology;  Laterality: N/A;   BLADDER SUSPENSION N/A 05/30/2022   Procedure: TRANSVAGINAL TAPE (TVT) PROCEDURE;  Surgeon: Marguerita Beards, MD;  Location: [facility];  Service: Gynecology;  Laterality: N/A;   COLONOSCOPY  2018   CYSTOSCOPY N/A 05/30/2022   Procedure: CYSTOSCOPY;  Surgeon: Marguerita Beards, MD;  Location: [facility];  Service: Gynecology;  Laterality: N/A;   PERINEOPLASTY  05/30/2022   Procedure: PERINEOPLASTY;  Surgeon: Marguerita Beards, MD;  Location: [facility];  Service: Gynecology;;   TOTAL VAGINAL HYSTERECTOMY  2004   @[facility] ;   W/   BILATERAL SALPINOOPHORECTOMY AND SLING PROCEDURE   Family History  Problem Relation  Age of Onset   Dementia Mother    Heart disease Father 25   Hypertension Father    Colon cancer Sister 2   Dementia Sister    Breast cancer Sister 39   Thyroid disease Sister    Depression Sister        anxiety   Obesity Daughter    Cancer Paternal Grandfather        bone   Social History   Socioeconomic History   Marital status: Divorced    Spouse name: Not on file   Number of children: Not on file   Years of education: Not on file   Highest education level: Not on file  Occupational History   Not on file  Tobacco Use   Smoking status: Former  Years: 15    Types: Cigarettes    Quit date: 60    Years since quitting: 49.5   Smokeless tobacco: Never  Vaping Use   Vaping Use: Never used  Substance and Sexual Activity   Alcohol use: Not Currently    Comment: very rare   Drug use: Never   Sexual activity: Yes  Other Topics Concern   Not on file  Social History Narrative   Not on file   Social Determinants of Health   Financial Resource Strain: Medium Risk (08/30/2022)   Overall Financial Resource Strain (CARDIA)    Difficulty of Paying Living Expenses: Somewhat hard  Food Insecurity: No Food Insecurity (08/30/2022)   Hunger Vital Sign    Worried About Running Out of Food in the Last Year: Never true    Ran Out of Food in the Last Year: Never true  Transportation Needs: No Transportation Needs (08/30/2022)   PRAPARE - Administrator, Civil Service (Medical): No    Lack of Transportation (Non-Medical): No  Physical Activity: Inactive (08/30/2022)   Exercise Vital Sign    Days of Exercise per Week: 0 days    Minutes of Exercise per Session: 0 min  Stress: No Stress Concern Present (08/23/2020)   [employer] of Occupational Health - Occupational Stress Questionnaire    Feeling of Stress : Not at all  Social Connections: Moderately Isolated (08/23/2020)   Social Connection and Isolation Panel [NHANES]    Frequency of Communication with Friends  and Family: More than three times a week    Frequency of Social Gatherings with Friends and Family: More than three times a week    Attends Religious Services: More than 4 times per year    Active Member of [employer] or Organizations: No    Attends Engineer, structural: Never    Marital Status: Divorced    Tobacco Counseling Counseling given: Not Answered   Clinical Intake:  Pre-visit preparation completed: Yes  Pain : No/denies pain   BMI - recorded: 34.05 Nutritional Status: BMI > 30  Obese Nutritional Risks: None Diabetes: Yes CBG done?: No Did pt. bring in CBG monitor from home?: No  How often do you need to have someone help you when you read instructions, pamphlets, or other written materials from your doctor or pharmacy?: 1 - Never  Interpreter Needed?: No  Information entered by :: Donne Anon, CMA   Activities of Daily Living    08/30/2022    1:34 PM 05/30/2022    9:58 AM  In your present state of health, do you have any difficulty performing the following activities:  Hearing? 0 0  Vision? 0 0  Difficulty concentrating or making decisions? 0 0  Walking or climbing stairs? 0 0  Dressing or bathing? 0 0  Doing errands, shopping? 0   Preparing Food and eating ? N   Using the Toilet? N   In the past six months, have you accidently leaked urine? N   Do you have problems with loss of bowel control? N   Managing your Medications? N   Managing your Finances? N   Housekeeping or managing your Housekeeping? N     Patient Care Team: Bradd Canary, MD as PCP - General (Family Medicine) Revankar, Aundra Dubin, MD as PCP - Cardiology (Cardiology) Revankar, Aundra Dubin, MD as Consulting Physician (Cardiology)  Indicate any recent Medical Services you may have received from other than [facility] providers in the past year (date may be  approximate).     Assessment:   This is a routine wellness examination for Laneisha.  Hearing/Vision screen No results found.  Dietary  issues and exercise activities discussed:     Goals Addressed   None    Depression Screen    08/30/2022    1:54 PM 02/07/2022    3:40 PM 08/28/2021    1:50 PM 05/29/2021    2:56 PM 08/23/2020    2:32 PM 06/05/2020    3:25 PM 05/24/2019    1:21 PM  PHQ 2/9 Scores  PHQ - 2 Score 0 1 0 0 0 0 0  PHQ- 9 Score    0       Fall Risk    08/30/2022    1:44 PM 04/08/2022    2:54 PM 02/07/2022    3:40 PM 08/28/2021    1:48 PM 05/29/2021    2:59 PM  Fall Risk   Falls in the past year? 0 0 0 0 0  Number falls in past yr: 0 0 0 0 0  Injury with Fall? 0 0 0 0 0  Risk for fall due to : No Fall Risks   No Fall Risks   Follow up Falls evaluation completed Falls evaluation completed Falls evaluation completed Falls evaluation completed Falls evaluation completed    MEDICARE RISK AT HOME:  Medicare Risk at Home - 08/30/22 1336     Any stairs in or around the home? Yes    If so, are there any without handrails? No    Home free of loose throw rugs in walkways, pet beds, electrical cords, etc? Yes    Adequate lighting in your home to reduce risk of falls? Yes    Life alert? No    Use of a cane, walker or w/c? No    Grab bars in the bathroom? Yes    Shower chair or bench in shower? Yes    Elevated toilet seat or a handicapped toilet? No             TIMED UP AND GO:  Was the test performed?  Yes  Length of time to ambulate 10 feet: 6 sec Gait steady and fast without use of assistive device    Cognitive Function:    03/29/2016    1:24 PM  MMSE - Mini Mental State Exam  Orientation to time 5  Orientation to Place 5  Registration 3  Attention/ Calculation 5  Recall 3  Language- name 2 objects 2  Language- repeat 1  Language- follow 3 step command 3  Language- read & follow direction 1  Write a sentence 1  Copy design 1  Total score 30        08/30/2022    1:58 PM 08/28/2021    2:04 PM  6CIT Screen  What Year? 0 points 0 points  What month? 0 points 0 points  What time? 0  points 0 points  Count back from 20 0 points 0 points  Months in reverse 4 points 2 points  Repeat phrase 4 points 10 points  Total Score 8 points 12 points    Immunizations Immunization History  Administered Date(s) Administered   Fluad Quad(high Dose 65+) 10/26/2018, 12/19/2021   Influenza Split 01/01/2010, 01/16/2011, 11/25/2011, 01/03/2014   Influenza Whole 12/31/2012   Influenza, High Dose Seasonal PF 11/15/2016, 10/26/2018   Influenza,inj,Quad PF,6+ Mos 12/20/2013, 12/19/2014   Influenza-Unspecified 11/09/2015, 11/25/2017, 11/17/2019, 01/02/2021   PFIZER(Purple Top)SARS-COV-2 Vaccination 04/08/2019, 04/29/2019, 12/12/2019, 07/07/2020   Pfizer  Covid-19 Vaccine Bivalent Booster 45yrs & up 12/19/2021   Pneumococcal Conjugate-13 01/25/2013   Pneumococcal Polysaccharide-23 12/21/2007, 12/19/2014   Tdap 04/17/2011   Zoster, Unspecified 08/16/2021, 11/21/2021    TDAP status: Due, Education has been provided regarding the importance of this vaccine. Advised may receive this vaccine at local pharmacy or Health Dept. Aware to provide a copy of the vaccination record if obtained from local pharmacy or Health Dept. Verbalized acceptance and understanding.  Flu Vaccine status: Up to date  Pneumococcal vaccine status: Up to date  Covid-19 vaccine status: Information provided on how to obtain vaccines.   Qualifies for Shingles Vaccine? Yes   Zostavax completed No   Shingrix Completed?: Yes  Screening Tests Health Maintenance  Topic Date Due   Zoster Vaccines- Shingrix (1 of 2) 10/19/1964   FOOT EXAM  03/26/2016   DTaP/Tdap/Td (2 - Td or Tdap) 04/16/2021   Colonoscopy  04/22/2021   COVID-19 Vaccine (6 - 2023-24 season) 02/13/2022   OPHTHALMOLOGY EXAM  05/12/2022   Diabetic kidney evaluation - Urine ACR  05/30/2022   Medicare Annual Wellness (AWV)  08/29/2022   HEMOGLOBIN A1C  08/09/2022   INFLUENZA VACCINE  10/03/2022   MAMMOGRAM  12/11/2022   Diabetic kidney evaluation - eGFR  measurement  02/08/2023   Pneumonia Vaccine 12+ Years old  Completed   DEXA SCAN  Completed   Hepatitis C Screening  Completed   HPV VACCINES  Aged Out    Health Maintenance  Health Maintenance Due  Topic Date Due   Zoster Vaccines- Shingrix (1 of 2) 10[birth year]19/1964   FOOT EXAM  03/26/2016   DTaP/Tdap/Td (2 - Td or Tdap) 04/16/2021   Colonoscopy  04/22/2021   COVID-19 Vaccine (6 - 2023-24 season) 02/13/2022   OPHTHALMOLOGY EXAM  05/12/2022   Diabetic kidney evaluation - Urine ACR  05/30/2022   Medicare Annual Wellness (AWV)  08/29/2022   HEMOGLOBIN A1C  08/09/2022    Colorectal cancer screening: Referral to GI placed 05/29/21. Pt aware the office will call re: appt.  Mammogram status: Completed 12/10/21. Repeat every year  Bone Density status: Completed 10/02/20. Results reflect: Bone density results: OSTEOPENIA. Repeat every 2 years.  Lung Cancer Screening: (Low Dose CT Chest recommended if Age 26-80 years, 20 pack-year currently smoking OR have quit w/in 15years.) does not qualify.   Additional Screening:  Hepatitis C Screening: does qualify; Completed 12/19/14  Vision Screening: Recommended annual ophthalmology exams for early detection of glaucoma and other disorders of the eye. Is the patient up to date with their annual eye exam?  Yes  Who is the provider or what is the name of the office in which the patient attends annual eye exams? Dr. Sondra Barges If pt is not established with a provider, would they like to be referred to a provider to establish care? No .   Dental Screening: Recommended annual dental exams for proper oral hygiene  Diabetic Foot Exam: Diabetic Foot Exam: Overdue, Pt has been advised about the importance in completing this exam. Pt is scheduled for diabetic foot exam on N/a.  Community Resource Referral / Chronic Care Management: CRR required this visit?  No   CCM required this visit?  No     Plan:     I have personally reviewed and noted the following  in the patient's chart:   Medical and social history Use of alcohol, tobacco or illicit drugs  Current medications and supplements including opioid prescriptions. Patient is not currently taking opioid prescriptions. Functional ability and status  Nutritional status Physical activity Advanced directives List of other physicians Hospitalizations, surgeries, and ER visits in previous 12 months Vitals Screenings to include cognitive, depression, and falls Referrals and appointments  In addition, I have reviewed and discussed with patient certain preventive protocols, quality metrics, and best practice recommendations. A written personalized care plan for preventive services as well as general preventive health recommendations were provided to patient.     Donne Anon, CMA   08/30/2022   After Visit Summary: Sent to mychart.  Nurse Notes: None

## 2022-09-23 DIAGNOSIS — M5416 Radiculopathy, lumbar region: Secondary | ICD-10-CM | POA: Diagnosis not present

## 2022-09-23 DIAGNOSIS — M47816 Spondylosis without myelopathy or radiculopathy, lumbar region: Secondary | ICD-10-CM | POA: Diagnosis not present

## 2022-10-08 DIAGNOSIS — M47816 Spondylosis without myelopathy or radiculopathy, lumbar region: Secondary | ICD-10-CM | POA: Diagnosis not present

## 2022-10-08 DIAGNOSIS — M5416 Radiculopathy, lumbar region: Secondary | ICD-10-CM | POA: Diagnosis not present

## 2022-10-09 DIAGNOSIS — M5116 Intervertebral disc disorders with radiculopathy, lumbar region: Secondary | ICD-10-CM | POA: Diagnosis not present

## 2022-10-15 ENCOUNTER — Encounter: Payer: Medicare HMO | Admitting: Family Medicine

## 2022-10-17 ENCOUNTER — Encounter (INDEPENDENT_AMBULATORY_CARE_PROVIDER_SITE_OTHER): Payer: Self-pay

## 2022-10-17 DIAGNOSIS — M5416 Radiculopathy, lumbar region: Secondary | ICD-10-CM | POA: Diagnosis not present

## 2022-10-17 DIAGNOSIS — M47816 Spondylosis without myelopathy or radiculopathy, lumbar region: Secondary | ICD-10-CM | POA: Diagnosis not present

## 2022-10-22 DIAGNOSIS — M47816 Spondylosis without myelopathy or radiculopathy, lumbar region: Secondary | ICD-10-CM | POA: Diagnosis not present

## 2022-10-22 DIAGNOSIS — M5416 Radiculopathy, lumbar region: Secondary | ICD-10-CM | POA: Diagnosis not present

## 2022-10-24 DIAGNOSIS — M47816 Spondylosis without myelopathy or radiculopathy, lumbar region: Secondary | ICD-10-CM | POA: Diagnosis not present

## 2022-10-24 DIAGNOSIS — M5416 Radiculopathy, lumbar region: Secondary | ICD-10-CM | POA: Diagnosis not present

## 2022-10-29 DIAGNOSIS — M47816 Spondylosis without myelopathy or radiculopathy, lumbar region: Secondary | ICD-10-CM | POA: Diagnosis not present

## 2022-10-29 DIAGNOSIS — M5416 Radiculopathy, lumbar region: Secondary | ICD-10-CM | POA: Diagnosis not present

## 2022-10-31 DIAGNOSIS — M5416 Radiculopathy, lumbar region: Secondary | ICD-10-CM | POA: Diagnosis not present

## 2022-10-31 DIAGNOSIS — M47816 Spondylosis without myelopathy or radiculopathy, lumbar region: Secondary | ICD-10-CM | POA: Diagnosis not present

## 2022-11-05 DIAGNOSIS — H1189 Other specified disorders of conjunctiva: Secondary | ICD-10-CM | POA: Diagnosis not present

## 2022-11-05 DIAGNOSIS — H52203 Unspecified astigmatism, bilateral: Secondary | ICD-10-CM | POA: Diagnosis not present

## 2022-11-05 DIAGNOSIS — H524 Presbyopia: Secondary | ICD-10-CM | POA: Diagnosis not present

## 2022-11-05 DIAGNOSIS — H5203 Hypermetropia, bilateral: Secondary | ICD-10-CM | POA: Diagnosis not present

## 2022-11-05 DIAGNOSIS — H43393 Other vitreous opacities, bilateral: Secondary | ICD-10-CM | POA: Diagnosis not present

## 2022-11-05 DIAGNOSIS — H2513 Age-related nuclear cataract, bilateral: Secondary | ICD-10-CM | POA: Diagnosis not present

## 2022-11-05 DIAGNOSIS — H43822 Vitreomacular adhesion, left eye: Secondary | ICD-10-CM | POA: Diagnosis not present

## 2022-11-05 DIAGNOSIS — E119 Type 2 diabetes mellitus without complications: Secondary | ICD-10-CM | POA: Diagnosis not present

## 2022-11-05 DIAGNOSIS — Z7984 Long term (current) use of oral hypoglycemic drugs: Secondary | ICD-10-CM | POA: Diagnosis not present

## 2022-11-05 LAB — HM DIABETES EYE EXAM

## 2022-11-07 DIAGNOSIS — M5416 Radiculopathy, lumbar region: Secondary | ICD-10-CM | POA: Diagnosis not present

## 2022-11-07 DIAGNOSIS — M47816 Spondylosis without myelopathy or radiculopathy, lumbar region: Secondary | ICD-10-CM | POA: Diagnosis not present

## 2022-11-11 ENCOUNTER — Other Ambulatory Visit: Payer: Self-pay | Admitting: Family Medicine

## 2022-11-11 DIAGNOSIS — E1169 Type 2 diabetes mellitus with other specified complication: Secondary | ICD-10-CM

## 2022-11-12 ENCOUNTER — Encounter: Payer: Medicare HMO | Admitting: Family Medicine

## 2022-11-25 ENCOUNTER — Ambulatory Visit: Payer: Medicare HMO | Admitting: Pharmacist

## 2022-11-25 VITALS — BP 128/83 | HR 69

## 2022-11-25 DIAGNOSIS — I10 Essential (primary) hypertension: Secondary | ICD-10-CM

## 2022-11-25 MED ORDER — ESCITALOPRAM OXALATE 20 MG PO TABS: 40.0000 mg | ORAL_TABLET | Freq: Every day | ORAL | 1 refills | Status: DC

## 2022-11-25 MED ORDER — TRIAMTERENE-HCTZ 37.5-25 MG PO TABS: 1.0000 | ORAL_TABLET | Freq: Every day | ORAL | 1 refills | Status: DC

## 2022-11-25 NOTE — Progress Notes (Signed)
Patient appearing on report for [employer] - Hypertension Control report due to last documented ambulatory blood pressure of 147/85 on 08/30/2022. Next appointment with PCP is 02/2023   Outreached patient to discuss hypertension control and medication management.   Current antihypertensives: irbasartan 300mg  daily - LR was for 90 DS on 10/10/2022 Triamterene - hydrochlorothiazide daily - LR was 07/11/2022 for 90 DS  BP Readings from Last 3 Encounters:  11/25/22 128/83  08/30/22 (!) 147/85  07/03/22 133/73     Patient has an automated upper arm home BP machine.  Current blood pressure readings: hasn't checked much recently but checked today and blood pressure was 128/83 and HR 69  Patient has also blood pressure reading from Dr Denny Levy office - orthopedic on 09/28/2022 abd blood pressure was 131/75  Current meal patterns:  Has been trying to limit intake of red meat. Has increased beans and vegetables - especially lettuce, sweet potatoes and spinach.  She avoids canned foods as much as possible and does not add salt to food.  Morning is 1/2 can of Carnation breakfast drink + 4 ounces of milk.   Current physical activity: has joined [facility] since her bladder surgery earlier this year. She is going twice a week - 1 day water aerobics and 1 day floor routine.   Patient denies hypotensive signs and symptoms including no dizziness, lightheadedness.  Patient denies hypertensive symptoms including no headache, chest pain, shortness of breath.  Patient denies side effects related to medications      Assessment/Plan: - Currently controlled - - Reviewed goal blood pressure <140/90 - Reviewed appropriate administration of medication regimen - Reviewed to check blood pressure 1 to 2 times per week, document, and provide at next provider visit - Discussed insurance coverage for BP monitor. Collaborated with provider to place order for BP monitor.  - Discussed dietary modifications,  such as reduced salt intake, focus on whole grains, vegetables, lean proteins - Recommend continue current medications for lowering blood pressure. Updated Rx for triamterene-HCTZ   Reviewed med list and updated (removed several medications that were added prior to surgery but she has since stopped)  Patient requested refill for escitalopram - updated Rx.   Henrene Pastor, PharmD Clinical Pharmacist [facility]

## 2022-12-04 DIAGNOSIS — M5116 Intervertebral disc disorders with radiculopathy, lumbar region: Secondary | ICD-10-CM | POA: Diagnosis not present

## 2022-12-04 DIAGNOSIS — M47816 Spondylosis without myelopathy or radiculopathy, lumbar region: Secondary | ICD-10-CM | POA: Diagnosis not present

## 2022-12-04 DIAGNOSIS — E1122 Type 2 diabetes mellitus with diabetic chronic kidney disease: Secondary | ICD-10-CM | POA: Diagnosis not present

## 2022-12-04 DIAGNOSIS — N183 Chronic kidney disease, stage 3 unspecified: Secondary | ICD-10-CM | POA: Diagnosis not present

## 2023-02-10 ENCOUNTER — Encounter: Payer: Self-pay | Admitting: Family Medicine

## 2023-02-10 ENCOUNTER — Ambulatory Visit (INDEPENDENT_AMBULATORY_CARE_PROVIDER_SITE_OTHER): Payer: Medicare HMO | Admitting: Family Medicine

## 2023-02-10 ENCOUNTER — Ambulatory Visit (HOSPITAL_BASED_OUTPATIENT_CLINIC_OR_DEPARTMENT_OTHER)
Admission: RE | Admit: 2023-02-10 | Discharge: 2023-02-10 | Disposition: A | Discharge status: Home / Self Care | Payer: Medicare HMO | Source: Ambulatory Visit | Attending: Family Medicine | Admitting: Family Medicine

## 2023-02-10 ENCOUNTER — Encounter (HOSPITAL_BASED_OUTPATIENT_CLINIC_OR_DEPARTMENT_OTHER): Payer: Self-pay

## 2023-02-10 VITALS — BP 114/70 | HR 93 | Ht 67.0 in | Wt 216.0 lb

## 2023-02-10 DIAGNOSIS — E1169 Type 2 diabetes mellitus with other specified complication: Secondary | ICD-10-CM

## 2023-02-10 DIAGNOSIS — Z1231 Encounter for screening mammogram for malignant neoplasm of breast: Secondary | ICD-10-CM | POA: Diagnosis not present

## 2023-02-10 DIAGNOSIS — E669 Obesity, unspecified: Secondary | ICD-10-CM | POA: Diagnosis not present

## 2023-02-10 DIAGNOSIS — E782 Mixed hyperlipidemia: Secondary | ICD-10-CM

## 2023-02-10 DIAGNOSIS — I10 Essential (primary) hypertension: Secondary | ICD-10-CM

## 2023-02-10 DIAGNOSIS — Z0000 Encounter for general adult medical examination without abnormal findings: Secondary | ICD-10-CM

## 2023-02-10 NOTE — Progress Notes (Signed)
Complete physical exam  Patient: Brenda [name]   DOB: Sep 09, 1945   77 y.o. Female  MRN: [id number]  Subjective:    Chief Complaint  Patient presents with   Annual Exam    Brenda [name] is a 77 y.o. female who presents today for a complete physical exam. She reports consuming a general diet.  She has started going to the pool at the [facility] at least twice per week  She generally feels well. She reports sleeping well. She does not have additional problems to discuss today.   Currently lives with: alone Acute concerns or interim problems since last visit: no  Vision concerns: no Dental concerns: no   ETOH use: rarely Nicotine use: no Recreational drugs/illegal substances: no   Wt Readings from Last 3 Encounters:  02/10/23 216 lb (98 kg)  08/30/22 217 lb 6.4 oz (98.6 kg)  05/30/22 215 lb (97.5 kg)      Most recent fall risk assessment:    02/10/2023    1:45 PM  Fall Risk   Falls in the past year? 0  Number falls in past yr: 0  Injury with Fall? 0  Risk for fall due to : No Fall Risks  Follow up Falls evaluation completed     Most recent depression screenings:    02/10/2023    2:02 PM 08/30/2022    1:54 PM  PHQ 2/9 Scores  PHQ - 2 Score 1 0  PHQ- 9 Score 2         02/10/2023    2:02 PM 02/07/2022    3:41 PM  GAD 7 : Generalized Anxiety Score  Nervous, Anxious, on Edge 0 0  Control/stop worrying 0 0  Worry too much - different things 0 0  Trouble relaxing 0 0  Restless 0 0  Easily annoyed or irritable 0 0  Afraid - awful might happen 0 0  Total GAD 7 Score 0 0  Anxiety Difficulty Not difficult at all Not difficult at all           Patient Care Team: Bradd Canary, MD as PCP - General (Family Medicine) Revankar, Aundra Dubin, MD as PCP - Cardiology (Cardiology) Revankar, Aundra Dubin, MD as Consulting Physician (Cardiology)   Outpatient Medications Prior to Visit  Medication Sig   acetaminophen (TYLENOL) 500 MG tablet Take 1 tablet (500 mg total) by  mouth every 6 (six) hours as needed (pain).   Alcohol Swabs 70 % PADS Use as directed once a day.   aspirin 81 MG tablet Take 81 mg by mouth daily.   atorvastatin (LIPITOR) 80 MG tablet TAKE 1 TABLET EVERY DAY   blood glucose meter kit and supplies Dispense based on patient and insurance preference. Use Prn to check blood suagr   Blood Glucose Monitoring Suppl (TRUE METRIX AIR GLUCOSE METER) w/Device KIT Use to check glucose once a day.  Dx code: E11.9   Calcium Citrate-Vitamin D (CALCIUM CITRATE + D) 250-5 MG-MCG TABS Take 2 tablets by mouth daily at 12 noon.   celecoxib (CELEBREX) 100 MG capsule Take 1 capsule by mouth daily.   cholecalciferol (VITAMIN D3) 25 MCG (1000 UNIT) tablet Take 1,000 Units by mouth daily.   Cyanocobalamin (VITAMIN B 12 PO) Take 1,000 Units by mouth daily.   escitalopram (LEXAPRO) 20 MG tablet Take 2 tablets (40 mg total) by mouth daily.   famotidine (PEPCID) 40 MG tablet TAKE 1 TABLET EVERY DAY (Patient taking differently: Take 40 mg by mouth daily.)  glucose blood (TRUE METRIX BLOOD GLUCOSE TEST) test strip Use to check glucose once a day.  Dx code: E11.9   irbesartan (AVAPRO) 150 MG tablet TAKE 1 TABLET EVERY DAY   KRILL OIL OMEGA-3 PO Take by mouth daily.   metFORMIN (GLUCOPHAGE) 500 MG tablet TAKE 1 TABLET BY MOUTH ONCE DAILY BEFORE LUNCH   Multiple Vitamins-Minerals (ALIVE WOMENS 50+ PO) Take by mouth daily.   polyethylene glycol powder (GLYCOLAX/MIRALAX) 17 GM/SCOOP powder Take 17 g by mouth daily. Drink 17g (1 scoop) dissolved in water per day.   Probiotic Product (PROBIOTIC DAILY) CAPS Take 1 capsule by mouth daily.   traMADol (ULTRAM) 50 MG tablet Take 1 tablet by mouth every 6 (six) hours as needed for moderate pain or severe pain.   triamterene-hydrochlorothiazide (MAXZIDE-25) 37.5-25 MG tablet Take 1 tablet by mouth daily.   TRUEplus Lancets 33G MISC Use to check glucose once a day.  Dx code: E11.9   TURMERIC PO Take 1 capsule by mouth daily.   No  facility-administered medications prior to visit.    ROS All review of systems negative except what is listed in the HPI        Objective:     BP 114/70   Pulse 93   Ht 5\' 7"  (1.702 m)   Wt 216 lb (98 kg)   SpO2 98%   BMI 33.83 kg/m    Physical Exam Vitals reviewed.  Constitutional:      General: She is not in acute distress.    Appearance: Normal appearance. She is obese. She is not ill-appearing.  HENT:     Head: Normocephalic and atraumatic.     Right Ear: Tympanic membrane normal.     Left Ear: Tympanic membrane normal.     Nose: Nose normal.     Mouth/Throat:     Mouth: Mucous membranes are moist.     Pharynx: Oropharynx is clear.  Eyes:     Extraocular Movements: Extraocular movements intact.     Conjunctiva/sclera: Conjunctivae normal.     Pupils: Pupils are equal, round, and reactive to light.  Cardiovascular:     Rate and Rhythm: Normal rate and regular rhythm.     Pulses: Normal pulses.     Heart sounds: Normal heart sounds.  Pulmonary:     Effort: Pulmonary effort is normal.     Breath sounds: Normal breath sounds.  Abdominal:     General: Abdomen is flat. Bowel sounds are normal. There is no distension.     Palpations: Abdomen is soft. There is no mass.     Tenderness: There is no abdominal tenderness. There is no right CVA tenderness, left CVA tenderness, guarding or rebound.  Genitourinary:    Comments: Deferred exam Musculoskeletal:        General: Normal range of motion.     Cervical back: Normal range of motion and neck supple. No tenderness.     Right lower leg: No edema.     Left lower leg: No edema.  Lymphadenopathy:     Cervical: No cervical adenopathy.  Skin:    General: Skin is warm and dry.     Capillary Refill: Capillary refill takes less than 2 seconds.  Neurological:     General: No focal deficit present.     Mental Status: She is alert and oriented to person, place, and time. Mental status is at baseline.  Psychiatric:         Mood and Affect: Mood normal.  Behavior: Behavior normal.        Thought Content: Thought content normal.        Judgment: Judgment normal.         Results for orders placed or performed in visit on 02/10/23  HM DIABETES EYE EXAM  Result Value Ref Range   HM Diabetic Eye Exam No Retinopathy No Retinopathy       Assessment & Plan:    Routine Health Maintenance and Physical Exam Discussed health promotion and safety including diet and exercise recommendations, dental health, and injury prevention. Tobacco cessation if applicable. Seat belts, sunscreen, smoke detectors, etc.    Immunization History  Administered Date(s) Administered   Fluad Quad(high Dose 65+) 10/26/2018, 12/19/2021, 10/31/2022   Influenza Split 01/01/2010, 01/16/2011, 11/25/2011, 01/03/2014   Influenza Whole 12/31/2012   Influenza, High Dose Seasonal PF 11/15/2016, 10/26/2018   Influenza,inj,Quad PF,6+ Mos 12/20/2013, 12/19/2014   Influenza-Unspecified 11/09/2015, 11/25/2017, 11/17/2019, 01/02/2021   PFIZER(Purple Top)SARS-COV-2 Vaccination 04/08/2019, 04/29/2019, 12/12/2019, 07/07/2020   Pfizer Covid-19 Vaccine Bivalent Booster 50yrs & up 12/19/2021   Pfizer(Comirnaty)Fall Seasonal Vaccine 12 years and older 10/31/2022   Pneumococcal Conjugate-13 01/25/2013   Pneumococcal Polysaccharide-23 12/21/2007, 12/19/2014   Tdap 04/17/2011   Zoster, Unspecified 08/16/2021, 11/21/2021    Health Maintenance  Topic Date Due   Zoster Vaccines- Shingrix (1 of 2) 10/19/1964   FOOT EXAM  03/26/2016   DTaP/Tdap/Td (2 - Td or Tdap) 04/16/2021   Colonoscopy  04/22/2021   Diabetic kidney evaluation - Urine ACR  05/30/2022   HEMOGLOBIN A1C  08/09/2022   MAMMOGRAM  12/11/2022   Diabetic kidney evaluation - eGFR measurement  02/08/2023   COVID-19 Vaccine (7 - 2023-24 season) 02/07/2024 (Originally 12/26/2022)   Medicare Annual Wellness (AWV)  08/30/2023   OPHTHALMOLOGY EXAM  11/05/2023   Pneumonia Vaccine 65+ Years  old  Completed   INFLUENZA VACCINE  Completed   DEXA SCAN  Completed   Hepatitis C Screening  Completed   HPV VACCINES  Aged Out        Problem List Items Addressed This Visit       Active Problems   Hypertension   Relevant Orders   Comprehensive metabolic panel   Lipid panel   TSH   Hyperlipidemia   Relevant Orders   Comprehensive metabolic panel   Lipid panel   Type 2 diabetes mellitus with obesity (HCC)   Relevant Orders   Hemoglobin A1c   Comprehensive metabolic panel   Lipid panel   Microalbumin / creatinine urine ratio   Other Visit Diagnoses     Annual physical exam    -  Primary   Relevant Orders   Hemoglobin A1c   CBC with Differential/Platelet   Comprehensive metabolic panel   Lipid panel   TSH   Microalbumin / creatinine urine ratio   MM 3D SCREENING MAMMOGRAM BILATERAL BREAST   Encounter for screening mammogram for malignant neoplasm of breast       Relevant Orders   MM 3D SCREENING MAMMOGRAM BILATERAL BREAST      Return in about 3 months (around 05/11/2023) for routine follow-up.     Clayborne Dana, NP

## 2023-02-11 LAB — MICROALBUMIN / CREATININE URINE RATIO
Creatinine,U: 124.7 mg/dL
Microalb Creat Ratio: 0.6 mg/g (ref 0.0–30.0)
Microalb, Ur: <0.7 mg/dL (ref 0.0–1.9)

## 2023-02-11 LAB — CBC WITH DIFFERENTIAL/PLATELET
Basophils Absolute: 0.1 10*3/uL (ref 0.0–0.1)
Basophils Relative: 1 % (ref 0.0–3.0)
Eosinophils Absolute: 0.3 10*3/uL (ref 0.0–0.7)
Eosinophils Relative: 6.1 % — ABNORMAL HIGH (ref 0.0–5.0)
HCT: 43 % (ref 36.0–46.0)
Hemoglobin: 13.9 g/dL (ref 12.0–15.0)
Lymphocytes Relative: 48.5 % — ABNORMAL HIGH (ref 12.0–46.0)
Lymphs Abs: 2.6 10*3/uL (ref 0.7–4.0)
MCHC: 32.4 g/dL (ref 30.0–36.0)
MCV: 91.7 fL (ref 78.0–100.0)
Monocytes Absolute: 0.4 10*3/uL (ref 0.1–1.0)
Monocytes Relative: 7.6 % (ref 3.0–12.0)
Neutro Abs: 2 10*3/uL (ref 1.4–7.7)
Neutrophils Relative %: 36.8 % — ABNORMAL LOW (ref 43.0–77.0)
Platelets: 332 10*3/uL (ref 150.0–400.0)
RBC: 4.69 Mil/uL (ref 3.87–5.11)
RDW: 15.7 % — ABNORMAL HIGH (ref 11.5–15.5)
WBC: 5.4 10*3/uL (ref 4.0–10.5)

## 2023-02-11 LAB — COMPREHENSIVE METABOLIC PANEL
ALT: 11 U/L (ref 0–35)
AST: 14 U/L (ref 0–37)
Albumin: 4.1 g/dL (ref 3.5–5.2)
Alkaline Phosphatase: 81 U/L (ref 39–117)
BUN: 31 mg/dL — ABNORMAL HIGH (ref 6–23)
CO2: 26 meq/L (ref 19–32)
Calcium: 9.6 mg/dL (ref 8.4–10.5)
Chloride: 106 meq/L (ref 96–112)
Creatinine, Ser: 0.97 mg/dL (ref 0.40–1.20)
GFR: 56.44 mL/min — ABNORMAL LOW (ref >60.00)
Glucose, Bld: 97 mg/dL (ref 70–99)
Potassium: 5 meq/L (ref 3.5–5.1)
Sodium: 140 meq/L (ref 135–145)
Total Bilirubin: 0.4 mg/dL (ref 0.2–1.2)
Total Protein: 7.3 g/dL (ref 6.0–8.3)

## 2023-02-11 LAB — HEMOGLOBIN A1C: Hgb A1c MFr Bld: 6.8 % — ABNORMAL HIGH (ref 4.6–6.5)

## 2023-02-11 LAB — LIPID PANEL
Cholesterol: 147 mg/dL (ref 0–200)
HDL: 49.2 mg/dL (ref >39.00)
LDL Cholesterol: 69 mg/dL (ref 0–99)
NonHDL: 97.35
Total CHOL/HDL Ratio: 3
Triglycerides: 142 mg/dL (ref 0.0–149.0)
VLDL: 28.4 mg/dL (ref 0.0–40.0)

## 2023-02-11 LAB — TSH: TSH: 1.88 u[IU]/mL (ref 0.35–5.50)

## 2023-02-21 DIAGNOSIS — E1122 Type 2 diabetes mellitus with diabetic chronic kidney disease: Secondary | ICD-10-CM | POA: Diagnosis not present

## 2023-02-21 DIAGNOSIS — N1831 Chronic kidney disease, stage 3a: Secondary | ICD-10-CM | POA: Diagnosis not present

## 2023-02-21 DIAGNOSIS — M5416 Radiculopathy, lumbar region: Secondary | ICD-10-CM | POA: Diagnosis not present

## 2023-02-21 DIAGNOSIS — M1712 Unilateral primary osteoarthritis, left knee: Secondary | ICD-10-CM | POA: Diagnosis not present

## 2023-03-06 DIAGNOSIS — M51361 Other intervertebral disc degeneration, lumbar region with lower extremity pain only: Secondary | ICD-10-CM | POA: Diagnosis not present

## 2023-03-06 DIAGNOSIS — M5416 Radiculopathy, lumbar region: Secondary | ICD-10-CM | POA: Diagnosis not present

## 2023-03-06 DIAGNOSIS — M1712 Unilateral primary osteoarthritis, left knee: Secondary | ICD-10-CM | POA: Diagnosis not present

## 2023-03-06 DIAGNOSIS — M1612 Unilateral primary osteoarthritis, left hip: Secondary | ICD-10-CM | POA: Diagnosis not present

## 2023-03-18 ENCOUNTER — Encounter (HOSPITAL_BASED_OUTPATIENT_CLINIC_OR_DEPARTMENT_OTHER): Payer: Self-pay | Admitting: Emergency Medicine

## 2023-03-18 ENCOUNTER — Emergency Department (HOSPITAL_BASED_OUTPATIENT_CLINIC_OR_DEPARTMENT_OTHER)
Admission: EM | Admit: 2023-03-18 | Discharge: 2023-03-18 | Disposition: A | Discharge status: Home / Self Care | Payer: Medicare HMO | Attending: Emergency Medicine | Admitting: Emergency Medicine

## 2023-03-18 ENCOUNTER — Other Ambulatory Visit: Payer: Self-pay

## 2023-03-18 DIAGNOSIS — R232 Flushing: Secondary | ICD-10-CM | POA: Diagnosis not present

## 2023-03-18 DIAGNOSIS — F419 Anxiety disorder, unspecified: Secondary | ICD-10-CM | POA: Insufficient documentation

## 2023-03-18 DIAGNOSIS — Z7982 Long term (current) use of aspirin: Secondary | ICD-10-CM | POA: Diagnosis not present

## 2023-03-18 DIAGNOSIS — I10 Essential (primary) hypertension: Secondary | ICD-10-CM | POA: Insufficient documentation

## 2023-03-18 DIAGNOSIS — T43225A Adverse effect of selective serotonin reuptake inhibitors, initial encounter: Secondary | ICD-10-CM

## 2023-03-18 DIAGNOSIS — T887XXA Unspecified adverse effect of drug or medicament, initial encounter: Secondary | ICD-10-CM | POA: Diagnosis not present

## 2023-03-18 DIAGNOSIS — R519 Headache, unspecified: Secondary | ICD-10-CM | POA: Diagnosis not present

## 2023-03-18 DIAGNOSIS — T43205A Adverse effect of unspecified antidepressants, initial encounter: Secondary | ICD-10-CM | POA: Diagnosis not present

## 2023-03-18 DIAGNOSIS — Z79899 Other long term (current) drug therapy: Secondary | ICD-10-CM | POA: Insufficient documentation

## 2023-03-18 LAB — COMPREHENSIVE METABOLIC PANEL
ALT: 19 U/L (ref 0–44)
AST: 21 U/L (ref 15–41)
Albumin: 3.9 g/dL (ref 3.5–5.0)
Alkaline Phosphatase: 77 U/L (ref 38–126)
Anion gap: 12 (ref 5–15)
BUN: 34 mg/dL — ABNORMAL HIGH (ref 8–23)
CO2: 20 mmol/L — ABNORMAL LOW (ref 22–32)
Calcium: 9.4 mg/dL (ref 8.9–10.3)
Chloride: 104 mmol/L (ref 98–111)
Creatinine, Ser: 0.86 mg/dL (ref 0.44–1.00)
GFR, Estimated: >60 mL/min (ref >60)
Glucose, Bld: 164 mg/dL — ABNORMAL HIGH (ref 70–99)
Potassium: 3.8 mmol/L (ref 3.5–5.1)
Sodium: 136 mmol/L (ref 135–145)
Total Bilirubin: 0.5 mg/dL (ref 0.0–1.2)
Total Protein: 8.1 g/dL (ref 6.5–8.1)

## 2023-03-18 LAB — CBC WITH DIFFERENTIAL/PLATELET
Abs Immature Granulocytes: 0.02 10*3/uL (ref 0.00–0.07)
Basophils Absolute: 0.1 10*3/uL (ref 0.0–0.1)
Basophils Relative: 1 %
Eosinophils Absolute: 0.3 10*3/uL (ref 0.0–0.5)
Eosinophils Relative: 4 %
HCT: 44.1 % (ref 36.0–46.0)
Hemoglobin: 14.5 g/dL (ref 12.0–15.0)
Immature Granulocytes: 0 %
Lymphocytes Relative: 45 %
Lymphs Abs: 3.3 10*3/uL (ref 0.7–4.0)
MCH: 29.4 pg (ref 26.0–34.0)
MCHC: 32.9 g/dL (ref 30.0–36.0)
MCV: 89.3 fL (ref 80.0–100.0)
Monocytes Absolute: 0.5 10*3/uL (ref 0.1–1.0)
Monocytes Relative: 7 %
Neutro Abs: 3.1 10*3/uL (ref 1.7–7.7)
Neutrophils Relative %: 43 %
Platelets: 328 10*3/uL (ref 150–400)
RBC: 4.94 MIL/uL (ref 3.87–5.11)
RDW: 15.1 % (ref 11.5–15.5)
WBC: 7.2 10*3/uL (ref 4.0–10.5)
nRBC: 0 % (ref 0.0–0.2)

## 2023-03-18 LAB — TSH: TSH: 2.778 u[IU]/mL (ref 0.350–4.500)

## 2023-03-18 LAB — CBG MONITORING, ED: Glucose-Capillary: 120 mg/dL — ABNORMAL HIGH (ref 70–99)

## 2023-03-18 LAB — T4, FREE: Free T4: 0.76 ng/dL (ref 0.61–1.12)

## 2023-03-18 LAB — TROPONIN I (HIGH SENSITIVITY): Troponin I (High Sensitivity): 3 ng/L (ref <18)

## 2023-03-18 LAB — MAGNESIUM: Magnesium: 1.9 mg/dL (ref 1.7–2.4)

## 2023-03-18 MED ORDER — ESCITALOPRAM OXALATE 20 MG PO TABS: 20.0000 mg | ORAL_TABLET | Freq: Every day | ORAL | 0 refills | Status: DC

## 2023-03-18 MED ORDER — ESCITALOPRAM OXALATE 10 MG PO TABS: 10.0000 mg | ORAL_TABLET | Freq: Once | ORAL | Status: DC

## 2023-03-18 MED ORDER — ALPRAZOLAM 0.5 MG PO TABS
0.2500 mg | ORAL_TABLET | Freq: Once | ORAL | Status: AC
  Administered 2023-03-18: 0.25 mg via ORAL
  Filled 2023-03-18: qty 1

## 2023-03-18 NOTE — ED Notes (Signed)
 Pt states she ran out of her Lexapro  a week ago and she is waiting for it to come by mail. She states she now has a slight headache and just doesn't feel right . Pt states she has also started taking a new gummy vitamin this week as well. Pt states she is unsure if it is related to being out of her lexapro  or the gummies. Pt seems very anxious in triage. States she just felt like she had to get out of her house. Pt states she is prescribed the Lexapro  for anxiety.

## 2023-03-18 NOTE — ED Provider Notes (Signed)
 [place] [facility] Provider Note   CSN: [id number] Arrival date & time: 03/18/23  1621     History  Chief Complaint  Patient presents with   Anxiety    Brenda [name] is a 78 y.o. female.  HPI      Ran out of escitalopram , off for the last week Was washing some clothes, making spinach and began to feel hot, went outside to get fresh air and then felt like had a headache. Weird feeling. Always taken pills like supposed to.  Just felt like needed to get out of the house, needed to get some air, felt hot.  Before being on the anxiety medicines felt thje way and has not felt like this in a while.   Hot feeling. No dyspnea, no palpitations, no chest pain. Just feels hot. Nausea, if vomited might feel better.  Started taking a vitamin gummy ALIVE not sure if that is why  No smoking, cigarettes, other drugs ,denies etoh, possibility of benzo or etoh withdrawal Denies SI   Past Medical History:  Diagnosis Date   Anxiety 10/17/2010   Carpal tunnel syndrome on right 01/20/2012   Coronary artery calcification seen on CAT scan    cardiologist--- dr edwyna   DDD (degenerative disc disease), lumbar    Depression    DOE (dyspnea on exertion)    GERD (gastroesophageal reflux disease)    History of obstructive sleep apnea    (04-29-2022  per pt was retested , no issue)  study in epic 05-01-2014 by dr burnard [facility] 16.9 / hr w/ nocturnal oxygen saturation   History of uterine fibroid    Hyperlipidemia, mixed    2011   Hypertension    OA (osteoarthritis)    Osteopenia 03/19/2016   Prolapse of anterior vaginal wall    Prolapse of vaginal vault after hysterectomy    Seasonal allergies    SUI (stress urinary incontinence, female) 08/24/2012     Home Medications Prior to Admission medications   Medication Sig Start Date End Date Taking? Authorizing Provider  escitalopram  (LEXAPRO ) 20 MG tablet Take 1 tablet (20 mg total) by mouth daily for 7  days. 03/18/23 03/25/23 Yes Dreama Longs, MD  acetaminophen  (TYLENOL ) 500 MG tablet Take 1 tablet (500 mg total) by mouth every 6 (six) hours as needed (pain). 04/04/22   Zuleta, Kaitlin G, NP  Alcohol  Swabs  70 % PADS Use as directed once a day. 05/14/22   [name], MD  aspirin 81 MG tablet Take 81 mg by mouth daily.    [provider]  atorvastatin  (LIPITOR) 80 MG tablet TAKE 1 TABLET EVERY DAY 04/10/22   [name], MD  blood glucose meter kit and supplies Dispense based on patient and insurance preference. Use Prn to check blood suagr 02/07/22   [name], MD  Blood Glucose Monitoring Suppl (TRUE METRIX AIR GLUCOSE METER) w/Device KIT Use to check glucose once a day.  Dx code: E11.9 05/14/22   [name], MD  Calcium  Citrate-Vitamin D (CALCIUM  CITRATE + D) 250-5 MG-MCG TABS Take 2 tablets by mouth daily at 12 noon.    [provider]  celecoxib (CELEBREX) 100 MG capsule Take 1 capsule by mouth daily. 12/04/22   [provider]  cholecalciferol (VITAMIN D3) 25 MCG (1000 UNIT) tablet Take 1,000 Units by mouth daily.    [provider]  Cyanocobalamin (VITAMIN B 12 PO) Take 1,000 Units by mouth daily.  [provider]  escitalopram  (LEXAPRO ) 20 MG tablet Take 2 tablets (40 mg total) by mouth daily. 11/25/22   [name], MD  famotidine  (PEPCID ) 40 MG tablet TAKE 1 TABLET EVERY DAY Patient taking differently: Take 40 mg by mouth daily. 04/10/22   [name], MD  glucose blood (TRUE METRIX BLOOD GLUCOSE TEST) test strip Use to check glucose once a day.  Dx code: E11.9 05/14/22   [name], MD  irbesartan  (AVAPRO ) 150 MG tablet TAKE 1 TABLET EVERY DAY 04/10/22   [name], MD  KRILL OIL OMEGA-3 PO Take by mouth daily.    [provider]  metFORMIN  (GLUCOPHAGE ) 500 MG tablet TAKE 1 TABLET BY MOUTH ONCE DAILY BEFORE LUNCH 11/11/22   [name], MD  Multiple Vitamins-Minerals (ALIVE WOMENS 50+ PO) Take by mouth  daily.    [provider]  polyethylene glycol powder (GLYCOLAX /MIRALAX ) 17 GM/SCOOP powder Take 17 g by mouth daily. Drink 17g (1 scoop) dissolved in water  per day. 04/04/22   Zuleta, Kaitlin G, NP  Probiotic Product (PROBIOTIC DAILY) CAPS Take 1 capsule by mouth daily.    [provider]  traMADol  (ULTRAM ) 50 MG tablet Take 1 tablet by mouth every 6 (six) hours as needed for moderate pain or severe pain. 01/22/22   [provider]  triamterene -hydrochlorothiazide  (MAXZIDE-25) 37.5-25 MG tablet Take 1 tablet by mouth daily. 11/25/22   [name], MD  TRUEplus Lancets 33G MISC Use to check glucose once a day.  Dx code: E11.9 05/14/22   [name], MD  TURMERIC PO Take 1 capsule by mouth daily.    [provider]      Allergies    Meloxicam     Review of Systems   Review of Systems  Physical Exam Updated Vital Signs BP (!) 159/85 (BP Location: Left Arm)   Pulse 71   Temp 97.7 F (36.5 C)   Resp 19   Ht 5' 7 (1.702 m)   Wt 98 kg   SpO2 98%   BMI 33.83 kg/m  Physical Exam Constitutional:      General: She is not in acute distress.    Appearance: Normal appearance. She is not ill-appearing or diaphoretic.  HENT:     Head: Normocephalic and atraumatic.  Eyes:     General: No visual field deficit.    Extraocular Movements: Extraocular movements intact.     Conjunctiva/sclera: Conjunctivae normal.     Pupils: Pupils are equal, round, and reactive to light.  Neck:     Vascular: No JVD.  Cardiovascular:     Rate and Rhythm: Normal rate and regular rhythm.     Pulses: Normal pulses.     Heart sounds: Normal heart sounds. No murmur heard.    No friction rub. No gallop.  Pulmonary:     Effort: Pulmonary effort is normal. No respiratory distress.     Breath sounds: Normal breath sounds. No wheezing, rhonchi or rales.  Chest:     Chest wall: No tenderness.  Abdominal:     General: Abdomen is flat. There is no distension.     Palpations:  Abdomen is soft.  Musculoskeletal:        General: No swelling or tenderness.     Cervical back: Normal range of motion.     Right lower leg: No edema.     Left lower leg: No edema.  Skin:    General: Skin is warm and dry.  Findings: No erythema or rash.  Neurological:     General: No focal deficit present.     Mental Status: She is alert and oriented to person, place, and time.     GCS: GCS eye subscore is 4. GCS verbal subscore is 5. GCS motor subscore is 6.     Cranial Nerves: No cranial nerve deficit, dysarthria or facial asymmetry.     Sensory: No sensory deficit.     Motor: No weakness or tremor.     Coordination: Coordination normal. Finger-Nose-Finger Test normal.     Gait: Gait normal.     ED Results / Procedures / Treatments   Labs (all labs ordered are listed, but only abnormal results are displayed) Labs Reviewed  COMPREHENSIVE METABOLIC PANEL - Abnormal; Notable for the following components:      Result Value   CO2 20 (*)    Glucose, Bld 164 (*)    BUN 34 (*)    All other components within normal limits  CBG MONITORING, ED - Abnormal; Notable for the following components:   Glucose-Capillary 120 (*)    All other components within normal limits  CBC WITH DIFFERENTIAL/PLATELET  TSH  T4, FREE  MAGNESIUM  TROPONIN I (HIGH SENSITIVITY)  TROPONIN I (HIGH SENSITIVITY)    EKG EKG Interpretation Date/Time:  Tuesday March 18 2023 16:49:40 EST Ventricular Rate:  70 PR Interval:  136 QRS Duration:  78 QT Interval:  390 QTC Calculation: 421 R Axis:   -21  Text Interpretation: Normal sinus rhythm Inferior infarct , age undetermined Anterolateral infarct , age undetermined Abnormal ECG When compared with ECG of 30-May-2022 09:24, PREVIOUS ECG IS PRESENT Nonspecific very mild changes in comparison to prior Confirmed by Dreama Longs ([id number]) on 03/18/2023 5:06:40 PM  Radiology No results found.  Procedures Procedures    Medications Ordered in  ED Medications  ALPRAZolam  (XANAX ) tablet 0.25 mg (0.25 mg Oral Given 03/18/23 1722)    ED Course/ Medical Decision Making/ A&P                                  77yo female with history of depression, GERD, hypertension, hyperlipidemia, OA, DDD, OSA who presents with concern for anxiety, feeling flushed, hot, mild headache after she was unable to fill her 40mg  lexapro  rx for the last week.  DDx includes electrolyte abnormalities, anemia, thyroid  abnormalities, arrhythmia.  Headache mild, no trauma, normal neurologic exam, low suspicion for ICH.  EKG with normal sinus rhythm and no acute abnormalities.  Labs completed and evaluated by me show no anemia, no electrolyte abnormalities, no thyroid  abnormalities.  Denies possibility of etoh or xanax  withdrawal.   Suspect symptoms related to SSRI discontinuation syndrome from lexapro . Will re-initiate at 20mg  then increase to 40mg  in 1 week.  Patient discharged in stable condition with understanding of reasons to return.        Final Clinical Impression(s) / ED Diagnoses Final diagnoses:  Anxiety  Selective serotonin reuptake inhibitor (SSRI) discontinuation syndrome    Rx / DC Orders ED Discharge Orders          Ordered    escitalopram  (LEXAPRO ) 20 MG tablet  Daily        03/18/23 1846              Dreama Longs, MD 03/19/23 1057

## 2023-03-18 NOTE — ED Notes (Signed)
 Note written under JG written in error.

## 2023-03-18 NOTE — Discharge Instructions (Addendum)
 Initially start lexapro 20mg   (1tablet) daily then increase to 40mg  (2 tablets) daily after 1 week. I have given you a one week supply in case your medication is at your home now. You may take a tablet when you return home tonight.

## 2023-03-18 NOTE — ED Triage Notes (Signed)
 Pt states she ran out of her Lexapro  a week ago and she is waiting for it to come by mail. She states she now has a slight headache and just doesn't feel right . Pt states she has also started taking a new gummy vitamin this week as well. Pt states she is unsure if it is related to being out of her lexapro  or the gummies. Pt seems very anxious in triage. States she just felt like she had to get out of her house. Pt states she is prescribed the Lexapro  for anxiety.

## 2023-03-18 NOTE — ED Notes (Signed)
 Upon checking her gmail account, her mail order pharmacy made a delivery of her Lexapro at 1753 to her resident mailbox. EDP informed of same. Calm, states," I just needed my medicine"

## 2023-03-19 ENCOUNTER — Telehealth: Payer: Self-pay | Admitting: Family Medicine

## 2023-03-19 NOTE — Telephone Encounter (Signed)
 Patient is calling in about herr  Lexapro  to see if she needs to take another one she was given something elsa at the er she would like a call back regarding this medication issue

## 2023-03-20 ENCOUNTER — Ambulatory Visit: Payer: Self-pay | Admitting: Family Medicine

## 2023-03-20 NOTE — Telephone Encounter (Signed)
 Called patient. Instructions given per provider's note

## 2023-03-20 NOTE — Telephone Encounter (Unsigned)
Copied from CRM [id number]. Topic: Clinical - Medication Question >> Mar 20, 2023 10:26 AM Brenda [name] wrote: Reason for CRM: Patient needs clarification on taking Lexapro 20mg .

## 2023-03-20 NOTE — Telephone Encounter (Signed)
Copied from CRM [id number]. Topic: Clinical - Medication Question >> Mar 20, 2023 10:26 AM Isabell A wrote: Reason for CRM: Patient needs clarification on taking Lexapro 20mg .    Chief Complaint: Medication Question  Disposition: [] ED /[] Urgent Care (no appt availability in office) / [] Appointment(In office/virtual)/ []  [facility]/ [] Home Care/ [] Refused Recommended Disposition /[] [facility]/ [x]  Follow-up with PCP Additional Notes: Patient ran out of lexapro and was re-prescribed from the ER. Patient wanted to know why she was started with 20mg  when she was taking 40mg . RN explained that since she was out of the medication, she has to be restarted at a tapered pace. Pt verbalized understanding and will follow the directions on the prescription. Pt also asking if she can Tylenol after she took an Tax adviser. Rn will route to clinic to follow-up  Reason for Disposition  [1] Caller has NON-URGENT medicine question about med that PCP prescribed AND [2] triager unable to answer question  Answer Assessment - Initial Assessment Questions 1. NAME of MEDICINE: "What medicine(s) are you calling about?"     Lexapro 20mg   2. QUESTION: "What is your question?" (e.g., double dose of medicine, side effect)     Wanted to know why she had to start with the low dose  3. PRESCRIBER: "Who prescribed the medicine?" Reason: if prescribed by specialist, call should be referred to that group.     Dr. Abner Greenspan and Dr. Dalene Seltzer  4. SYMPTOMS: "Do you have any symptoms?" If Yes, ask: "What symptoms are you having?"  "How bad are the symptoms (e.g., mild, moderate, severe)     No Symptoms  Protocols used: Medication Question Call-A-AH

## 2023-03-24 ENCOUNTER — Other Ambulatory Visit: Payer: Self-pay | Admitting: Family Medicine

## 2023-04-01 DIAGNOSIS — I129 Hypertensive chronic kidney disease with stage 1 through stage 4 chronic kidney disease, or unspecified chronic kidney disease: Secondary | ICD-10-CM | POA: Diagnosis not present

## 2023-04-01 DIAGNOSIS — N1832 Chronic kidney disease, stage 3b: Secondary | ICD-10-CM | POA: Diagnosis not present

## 2023-04-01 DIAGNOSIS — E1122 Type 2 diabetes mellitus with diabetic chronic kidney disease: Secondary | ICD-10-CM | POA: Diagnosis not present

## 2023-04-01 DIAGNOSIS — M4726 Other spondylosis with radiculopathy, lumbar region: Secondary | ICD-10-CM | POA: Diagnosis not present

## 2023-05-08 ENCOUNTER — Other Ambulatory Visit: Payer: Self-pay | Admitting: Family Medicine

## 2023-05-26 ENCOUNTER — Ambulatory Visit: Payer: Medicare HMO | Admitting: Family Medicine

## 2023-05-28 ENCOUNTER — Ambulatory Visit (INDEPENDENT_AMBULATORY_CARE_PROVIDER_SITE_OTHER): Admitting: Physician Assistant

## 2023-05-28 ENCOUNTER — Ambulatory Visit: Admitting: Family Medicine

## 2023-05-28 ENCOUNTER — Encounter: Payer: Self-pay | Admitting: Physician Assistant

## 2023-05-28 VITALS — BP 120/82 | HR 85 | Temp 98.1°F | Resp 20 | Ht 67.0 in | Wt 214.4 lb

## 2023-05-28 DIAGNOSIS — E1169 Type 2 diabetes mellitus with other specified complication: Secondary | ICD-10-CM | POA: Diagnosis not present

## 2023-05-28 DIAGNOSIS — F419 Anxiety disorder, unspecified: Secondary | ICD-10-CM

## 2023-05-28 DIAGNOSIS — Z7984 Long term (current) use of oral hypoglycemic drugs: Secondary | ICD-10-CM

## 2023-05-28 DIAGNOSIS — I10 Essential (primary) hypertension: Secondary | ICD-10-CM | POA: Diagnosis not present

## 2023-05-28 DIAGNOSIS — E669 Obesity, unspecified: Secondary | ICD-10-CM

## 2023-05-28 DIAGNOSIS — L853 Xerosis cutis: Secondary | ICD-10-CM | POA: Insufficient documentation

## 2023-05-28 HISTORY — DX: Xerosis cutis: L85.3

## 2023-05-28 MED ORDER — ESCITALOPRAM OXALATE 20 MG PO TABS: 40.0000 mg | ORAL_TABLET | Freq: Every day | ORAL | 2 refills | Status: DC

## 2023-05-28 NOTE — Assessment & Plan Note (Signed)
 Symptoms were due to withdrawal-- she feels well today. Cont lexapro 40 mg

## 2023-05-28 NOTE — Assessment & Plan Note (Signed)
 Well controlled. Manages with irbesartan 150 mg and maxide. Will check bmp F/u 6 mo

## 2023-05-28 NOTE — Assessment & Plan Note (Signed)
 In regards to her urinary symptoms, encouraged continued use of hydrating creams, protecting skin w/ aquaphor or vaseline.  If persists, could consider topical estrogen cream

## 2023-05-28 NOTE — Progress Notes (Signed)
 Established patient visit   Patient: Brenda [name]   DOB: 08-27-1945   78 y.o. Female  MRN: [id number] Visit Date: 05/28/2023  Today's healthcare provider: Alfredia Ferguson, PA-C   Cc. Chronic care f/u  Subjective     Pt was in the ED 1/25 for anxiety and d/c SSRI. Lexapro was restarted at 20 mg and titrated up to 40 mg. Pt reports she was transitioning insurances and while waiting for the prescription to be mailed to her, she ran out for a few days.  She is back on 40 mg and feeling well.  She mentions offhand that her vulvar skin is dry-- her feet are dry, hands are dry. Sometimes when she urinates it is irritated as the skin there is dry.  Medications: Outpatient Medications Prior to Visit  Medication Sig   acetaminophen (TYLENOL) 500 MG tablet Take 1 tablet (500 mg total) by mouth every 6 (six) hours as needed (pain).   Alcohol Swabs 70 % PADS Use as directed once a day.   aspirin 81 MG tablet Take 81 mg by mouth daily.   atorvastatin (LIPITOR) 80 MG tablet Take 1 tablet (80 mg total) by mouth daily.   blood glucose meter kit and supplies Dispense based on patient and insurance preference. Use Prn to check blood suagr   Blood Glucose Monitoring Suppl (TRUE METRIX AIR GLUCOSE METER) w/Device KIT Use to check glucose once a day.  Dx code: E11.9   Calcium Citrate-Vitamin D (CALCIUM CITRATE + D) 250-5 MG-MCG TABS Take 2 tablets by mouth daily at 12 noon.   celecoxib (CELEBREX) 100 MG capsule Take 1 capsule by mouth daily.   cholecalciferol (VITAMIN D3) 25 MCG (1000 UNIT) tablet Take 1,000 Units by mouth daily.   Cyanocobalamin (VITAMIN B 12 PO) Take 1,000 Units by mouth daily.   famotidine (PEPCID) 40 MG tablet TAKE 1 TABLET EVERY DAY   glucose blood (TRUE METRIX BLOOD GLUCOSE TEST) test strip Use to check glucose once a day.  Dx code: E11.9   irbesartan (AVAPRO) 150 MG tablet Take 1 tablet (150 mg total) by mouth daily.   KRILL OIL OMEGA-3 PO Take by mouth daily.   metFORMIN  (GLUCOPHAGE) 500 MG tablet TAKE 1 TABLET BY MOUTH ONCE DAILY BEFORE LUNCH   Multiple Vitamins-Minerals (ALIVE WOMENS 50+ PO) Take by mouth daily.   polyethylene glycol powder (GLYCOLAX/MIRALAX) 17 GM/SCOOP powder Take 17 g by mouth daily. Drink 17g (1 scoop) dissolved in water per day.   Probiotic Product (PROBIOTIC DAILY) CAPS Take 1 capsule by mouth daily.   traMADol (ULTRAM) 50 MG tablet Take 1 tablet by mouth every 6 (six) hours as needed for moderate pain or severe pain.   triamterene-hydrochlorothiazide (MAXZIDE-25) 37.5-25 MG tablet Take 1 tablet by mouth daily.   TRUEplus Lancets 33G MISC Use to check glucose once a day.  Dx code: E11.9   TURMERIC PO Take 1 capsule by mouth daily.   [DISCONTINUED] escitalopram (LEXAPRO) 20 MG tablet Take 2 tablets (40 mg total) by mouth daily.   escitalopram (LEXAPRO) 20 MG tablet Take 1 tablet (20 mg total) by mouth daily for 7 days.   No facility-administered medications prior to visit.    Review of Systems  Constitutional:  Negative for fatigue and fever.  Respiratory:  Negative for cough and shortness of breath.   Cardiovascular:  Negative for chest pain and leg swelling.  Gastrointestinal:  Negative for abdominal pain.  Neurological:  Negative for dizziness and headaches.  Objective    BP 120/82 (BP Location: Right Arm, Patient Position: Sitting, Cuff Size: Large)   Pulse 85   Temp 98.1 F (36.7 C) (Oral)   Resp 20   Ht 5\' 7"  (1.702 m)   Wt 214 lb 6.4 oz (97.3 kg)   SpO2 96%   BMI 33.58 kg/m    Physical Exam Constitutional:      General: She is awake.     Appearance: She is well-developed.  HENT:     Head: Normocephalic.  Eyes:     Conjunctiva/sclera: Conjunctivae normal.  Cardiovascular:     Rate and Rhythm: Normal rate and regular rhythm.     Pulses:          Dorsalis pedis pulses are 2+ on the right side and 2+ on the left side.       Posterior tibial pulses are 2+ on the right side and 2+ on the left side.      Heart sounds: Normal heart sounds.  Pulmonary:     Effort: Pulmonary effort is normal.     Breath sounds: Normal breath sounds.  Feet:     Right foot:     Protective Sensation: 4 sites tested.  4 sites sensed.     Skin integrity: Skin integrity normal.     Toenail Condition: Right toenails are normal.     Left foot:     Protective Sensation: 4 sites tested.  4 sites sensed.     Skin integrity: Skin integrity normal.     Toenail Condition: Left toenails are normal.  Skin:    General: Skin is warm.  Neurological:     Mental Status: She is alert and oriented to person, place, and time.  Psychiatric:        Attention and Perception: Attention normal.        Mood and Affect: Mood normal.        Speech: Speech normal.        Behavior: Behavior is cooperative.     No results found for any visits on 05/28/23.  Assessment & Plan    Type 2 diabetes mellitus with obesity (HCC) Assessment & Plan: Last A1c 12/14 6.8%, well controlled with just metformin 500 mg.  Repeat A1c. On statin, arb.  Uacr utd.  Foot exam completed today. F/u 6 mo pending a1c  Orders: -     Hemoglobin A1c -     Basic metabolic panel  Primary hypertension Assessment & Plan: Well controlled. Manages with irbesartan 150 mg and maxide. Will check bmp F/u 6 mo   Anxiety Assessment & Plan: Symptoms were due to withdrawal-- she feels well today. Cont lexapro 40 mg  Orders: -     Escitalopram Oxalate; Take 2 tablets (40 mg total) by mouth daily.  Dispense: 180 tablet; Refill: 2  Dry skin Assessment & Plan: In regards to her urinary symptoms, encouraged continued use of hydrating creams, protecting skin w/ aquaphor or vaseline.  If persists, could consider topical estrogen cream     Return in about 6 months (around 11/28/2023) for chronic conditions.       Alfredia Ferguson, PA-C  [facility] [phone] (phone) [phone] (fax)  [facility]

## 2023-05-28 NOTE — Assessment & Plan Note (Signed)
 Last A1c 12/14 6.8%, well controlled with just metformin 500 mg.  Repeat A1c. On statin, arb.  Uacr utd.  Foot exam completed today. F/u 6 mo pending a1c

## 2023-05-29 ENCOUNTER — Other Ambulatory Visit: Payer: Self-pay | Admitting: Physician Assistant

## 2023-05-29 ENCOUNTER — Encounter: Payer: Self-pay | Admitting: Physician Assistant

## 2023-05-29 DIAGNOSIS — N179 Acute kidney failure, unspecified: Secondary | ICD-10-CM

## 2023-05-29 LAB — BASIC METABOLIC PANEL WITH GFR
BUN: 27 mg/dL — ABNORMAL HIGH (ref 6–23)
CO2: 25 meq/L (ref 19–32)
Calcium: 9.5 mg/dL (ref 8.4–10.5)
Chloride: 106 meq/L (ref 96–112)
Creatinine, Ser: 1.46 mg/dL — ABNORMAL HIGH (ref 0.40–1.20)
GFR: 34.48 mL/min — ABNORMAL LOW (ref >60.00)
Glucose, Bld: 126 mg/dL — ABNORMAL HIGH (ref 70–99)
Potassium: 4 meq/L (ref 3.5–5.1)
Sodium: 140 meq/L (ref 135–145)

## 2023-05-29 LAB — HEMOGLOBIN A1C: Hgb A1c MFr Bld: 7 % — ABNORMAL HIGH (ref 4.6–6.5)

## 2023-07-14 DIAGNOSIS — M4726 Other spondylosis with radiculopathy, lumbar region: Secondary | ICD-10-CM | POA: Diagnosis not present

## 2023-07-14 DIAGNOSIS — M5116 Intervertebral disc disorders with radiculopathy, lumbar region: Secondary | ICD-10-CM | POA: Diagnosis not present

## 2023-07-14 DIAGNOSIS — M51362 Other intervertebral disc degeneration, lumbar region with discogenic back pain and lower extremity pain: Secondary | ICD-10-CM | POA: Diagnosis not present

## 2023-07-26 DIAGNOSIS — M47816 Spondylosis without myelopathy or radiculopathy, lumbar region: Secondary | ICD-10-CM | POA: Diagnosis not present

## 2023-07-26 DIAGNOSIS — M4310 Spondylolisthesis, site unspecified: Secondary | ICD-10-CM | POA: Diagnosis not present

## 2023-07-26 DIAGNOSIS — M4807 Spinal stenosis, lumbosacral region: Secondary | ICD-10-CM | POA: Diagnosis not present

## 2023-07-26 DIAGNOSIS — M5116 Intervertebral disc disorders with radiculopathy, lumbar region: Secondary | ICD-10-CM | POA: Diagnosis not present

## 2023-08-07 ENCOUNTER — Ambulatory Visit (HOSPITAL_BASED_OUTPATIENT_CLINIC_OR_DEPARTMENT_OTHER)
Admission: RE | Admit: 2023-08-07 | Discharge: 2023-08-07 | Disposition: A | Discharge status: Home / Self Care | Source: Ambulatory Visit | Attending: Family Medicine | Admitting: Family Medicine

## 2023-08-07 ENCOUNTER — Ambulatory Visit: Admitting: Family Medicine

## 2023-08-07 ENCOUNTER — Telehealth: Payer: Self-pay

## 2023-08-07 ENCOUNTER — Encounter: Payer: Self-pay | Admitting: Family Medicine

## 2023-08-07 VITALS — BP 124/72 | HR 90 | Resp 16 | Ht 67.0 in | Wt 216.8 lb

## 2023-08-07 DIAGNOSIS — M19071 Primary osteoarthritis, right ankle and foot: Secondary | ICD-10-CM | POA: Diagnosis not present

## 2023-08-07 DIAGNOSIS — M79671 Pain in right foot: Secondary | ICD-10-CM

## 2023-08-07 DIAGNOSIS — Z7984 Long term (current) use of oral hypoglycemic drugs: Secondary | ICD-10-CM

## 2023-08-07 DIAGNOSIS — E1169 Type 2 diabetes mellitus with other specified complication: Secondary | ICD-10-CM | POA: Diagnosis not present

## 2023-08-07 DIAGNOSIS — J449 Chronic obstructive pulmonary disease, unspecified: Secondary | ICD-10-CM

## 2023-08-07 DIAGNOSIS — I10 Essential (primary) hypertension: Secondary | ICD-10-CM

## 2023-08-07 DIAGNOSIS — M62838 Other muscle spasm: Secondary | ICD-10-CM | POA: Diagnosis not present

## 2023-08-07 DIAGNOSIS — E669 Obesity, unspecified: Secondary | ICD-10-CM

## 2023-08-07 DIAGNOSIS — R911 Solitary pulmonary nodule: Secondary | ICD-10-CM

## 2023-08-07 DIAGNOSIS — E782 Mixed hyperlipidemia: Secondary | ICD-10-CM | POA: Diagnosis not present

## 2023-08-07 HISTORY — DX: Chronic obstructive pulmonary disease, unspecified: J44.9

## 2023-08-07 HISTORY — DX: Pain in right foot: M79.671

## 2023-08-07 HISTORY — DX: Solitary pulmonary nodule: R91.1

## 2023-08-07 NOTE — Assessment & Plan Note (Signed)
 Pain and what looks like a small vascular lesion at base of right great toe. No redness or fluctuance. She denies any trauma but notes it hurts more with weight baring and pressure. Check xray today and patient will report if symptoms worsen.

## 2023-08-07 NOTE — Patient Instructions (Addendum)
 Lung Nodule: What to Know 5 mm, no special characteristics  A lung nodule is a small, round growth of tissue in the lung. A nodule may be cancer, but most nodules are not cancer. Your nodules may not be cancer if: The nodules are small and have even edges. You don't smoke. You don't have other risk factors for lung cancer. Your nodules may be cancer if: The nodules are large and have uneven edges. You smoke. You have family members who have lung cancer. What are the causes? Infection from germs. Germs include bacteria, fungus, or virus. Cancer of the lung. Or, cancer that has spread to the lung from another part of the body. A growth of tissue that's not cancer. This is also called a mass. Swelling from problems like arthritis. A clump of blood vessels in the lung. What are the signs or symptoms? Many times, there are no symptoms. If you get symptoms, they normally have another cause, such as infection. How is this treated? Treatment for a lung nodule depends on: The cause of the nodule. Whether the nodule is cancer or not cancer. Your risk of getting cancer. Some nodules aren't cancer. If your nodule isn't cancer, you may not need treatment. Your doctor may watch the nodule for any changes. If the nodule is cancer, you'll need treatments, such as: High-energy X-rays (radiation). Medicines to kill cancer cells (chemotherapy). Medicines that kill cancer cells and spare healthy cells (targeted therapy). Medicines that help your body fight cancer (immunotherapy). Surgery to remove cancer cells. Follow these instructions at home: Take your medicines only as told. Do not smoke, vape, or use nicotine or tobacco. Keep all follow-up visits. Your provider may need to check your lungs often over a period of time. Contact a doctor if: You have pain in your chest, back, or shoulder. You have trouble breathing. You have a cough or a sore throat. You become hoarse or have trouble  speaking. You have a fever or chills. You don't feel like eating. You lose weight without trying. You start to sweat a lot during sleep. You sleep on two or more pillows to help you breathe at night. Get help right away if: You have chest pain. You can't catch your breath. You make whistling sounds when you breathe, most often when you breathe out. This is called wheezing. You cough up blood from your lungs. You become dizzy or you faint. These symptoms may be an emergency. Call 911 right away. Do not wait to see if the symptoms will go away. Do not drive yourself to the hospital. This information is not intended to replace advice given to you by your health care provider. Make sure you discuss any questions you have with your health care provider. Document Revised: 11/20/2022 Document Reviewed: 11/20/2022 Elsevier Patient Education  2025 [employer].

## 2023-08-07 NOTE — Progress Notes (Signed)
 Subjective:    Patient ID: Brenda [name], female    DOB: [birth year], 78 y.o.   MRN: [id number]  Chief Complaint  Patient presents with  . Acute Visit    Patient presents today to discuss her CT scan from 2023.    HPI Discussed the use of AI scribe software for clinical note transcription with the patient, who gave verbal consent to proceed.  History of Present Illness Brenda [name] is a 78 year old female with a history of pulmonary nodule who presents for follow-up and imaging review.  She experiences shortness of breath when climbing stairs, which has been stable over a long period without worsening. No chest pain or orthopnea. No recent major illnesses, fevers, or chills.  She has ongoing issues with her left hip and low back pain, with pain radiating down her side. An MRI was performed approximately a month ago for both the hip and back, but the results are not currently available. She has an upcoming appointment with an orthopedist on June 25th.  She has a history of a pulmonary nodule, previously identified during a heart check-up. She is awaiting a repeat CT scan to monitor the nodule's status. No new respiratory symptoms or chest pain.  She is currently taking atorvastatin , metformin , and Lexapro , along with various vitamins. Her bowel movements are regular, occurring daily.  She mentions a painful spot on her right foot, present for a couple of months, which hurts deeply and more when weight is applied.    Past Medical History:  Diagnosis Date  . Anxiety 10/17/2010  . Carpal tunnel syndrome on right 01/20/2012  . Coronary artery calcification seen on CAT scan    cardiologist--- dr Lafayette Pierre  . DDD (degenerative disc disease), lumbar   . Depression   . DOE (dyspnea on exertion)   . GERD (gastroesophageal reflux disease)   . History of obstructive sleep apnea    (04-29-2022  per pt was retested , no issue)  study in epic 05-01-2014 by dr Loetta Ringer [facility] 16.9 / hr w/ nocturnal  oxygen saturation  . History of uterine fibroid   . Hyperlipidemia, mixed    2011  . Hypertension   . OA (osteoarthritis)   . Osteopenia 03/19/2016  . Prolapse of anterior vaginal wall   . Prolapse of vaginal vault after hysterectomy   . Seasonal allergies   . SUI (stress urinary incontinence, female) 08/24/2012    Past Surgical History:  Procedure Laterality Date  . ANTERIOR AND POSTERIOR REPAIR WITH SACROSPINOUS FIXATION N/A 05/30/2022   Procedure: ANTERIOR  REPAIR WITH SACROSPINOUS FIXATION;  Surgeon: Arma Lamp, MD;  Location: [facility];  Service: Gynecology;  Laterality: N/A;  . BLADDER SUSPENSION N/A 05/30/2022   Procedure: TRANSVAGINAL TAPE (TVT) PROCEDURE;  Surgeon: Arma Lamp, MD;  Location[facility];  Service: Gynecology;  Laterality: N/A;  . COLONOSCOPY  2018  . CYSTOSCOPY N/A 05/30/2022   Procedure: CYSTOSCOPY;  Surgeon: Arma Lamp, MD;  Location: [facility];  Service: Gynecology;  Laterality: N/A;  . PERINEOPLASTY  05/30/2022   Procedure: PERINEOPLASTY;  Surgeon: Arma Lamp, MD;  Location: [facility];  Service: Gynecology;;  . TOTAL VAGINAL HYSTERECTOMY  2004   @[facility] ;   W/   BILATERAL SALPINOOPHORECTOMY AND SLING PROCEDURE    Family History  Problem Relation Age of Onset  . Dementia Mother   . Heart disease Father 51  . Hypertension Father   . Colon cancer  Sister 64  . Dementia Sister   . Breast cancer Sister 93  . Thyroid  disease Sister   . Depression Sister        anxiety  . Obesity Daughter   . Cancer Paternal Grandfather        bone    Social History   Socioeconomic History  . Marital status: Divorced    Spouse name: Not on file  . Number of children: Not on file  . Years of education: Not on file  . Highest education level: Not on file  Occupational History  . Not on file  Tobacco Use  . Smoking status: Former    Current packs/day: 0.00     Types: Cigarettes    Start date: 81    Quit date: 1975    Years since quitting: 50.4  . Smokeless tobacco: Never  Vaping Use  . Vaping status: Never Used  Substance and Sexual Activity  . Alcohol  use: Not Currently    Comment: very rare  . Drug use: Never  . Sexual activity: Yes  Other Topics Concern  . Not on file  Social History Narrative  . Not on file   Social Drivers of Health   Financial Resource Strain: Medium Risk (08/30/2022)   Overall Financial Resource Strain (CARDIA)   . Difficulty of Paying Living Expenses: Somewhat hard  Food Insecurity: No Food Insecurity (08/30/2022)   Hunger Vital Sign   . Worried About Programme researcher, broadcasting/film/video in the Last Year: Never true   . Ran Out of Food in the Last Year: Never true  Transportation Needs: No Transportation Needs (08/30/2022)   PRAPARE - Transportation   . Lack of Transportation (Medical): No   . Lack of Transportation (Non-Medical): No  Physical Activity: Inactive (08/30/2022)   Exercise Vital Sign   . Days of Exercise per Week: 0 days   . Minutes of Exercise per Session: 0 min  Stress: No Stress Concern Present (08/23/2020)   [employer] of Occupational Health - Occupational Stress Questionnaire   . Feeling of Stress : Not at all  Social Connections: Moderately Isolated (08/23/2020)   Social Connection and Isolation Panel [NHANES]   . Frequency of Communication with Friends and Family: More than three times a week   . Frequency of Social Gatherings with Friends and Family: More than three times a week   . Attends Religious Services: More than 4 times per year   . Active Member of Clubs or Organizations: No   . Attends Banker Meetings: Never   . Marital Status: Divorced  Catering manager Violence: Not At Risk (08/30/2022)   Humiliation, Afraid, Rape, and Kick questionnaire   . Fear of Current or Ex-Partner: No   . Emotionally Abused: No   . Physically Abused: No   . Sexually Abused: No     Outpatient Medications Prior to Visit  Medication Sig Dispense Refill  . acetaminophen  (TYLENOL ) 500 MG tablet Take 1 tablet (500 mg total) by mouth every 6 (six) hours as needed (pain). 30 tablet 0  . Alcohol  Swabs  70 % PADS Use as directed once a day. 100 each 1  . aspirin 81 MG tablet Take 81 mg by mouth daily.    . atorvastatin  (LIPITOR) 80 MG tablet Take 1 tablet (80 mg total) by mouth daily. 90 tablet 1  . blood glucose meter kit and supplies Dispense based on patient and insurance preference. Use Prn to check blood suagr 1 each 1  . Blood  Glucose Monitoring Suppl (TRUE METRIX AIR GLUCOSE METER) w/Device KIT Use to check glucose once a day.  Dx code: E11.9 1 kit 0  . Calcium  Citrate-Vitamin D (CALCIUM  CITRATE + D) 250-5 MG-MCG TABS Take 2 tablets by mouth daily at 12 noon.    . cholecalciferol (VITAMIN D3) 25 MCG (1000 UNIT) tablet Take 1,000 Units by mouth daily.    . Cyanocobalamin (VITAMIN B 12 PO) Take 1,000 Units by mouth daily.    . escitalopram  (LEXAPRO ) 20 MG tablet Take 2 tablets (40 mg total) by mouth daily. 180 tablet 2  . famotidine  (PEPCID ) 40 MG tablet TAKE 1 TABLET EVERY DAY 90 tablet 3  . glucose blood (TRUE METRIX BLOOD GLUCOSE TEST) test strip Use to check glucose once a day.  Dx code: E11.9 100 each 1  . irbesartan  (AVAPRO ) 150 MG tablet Take 1 tablet (150 mg total) by mouth daily. 90 tablet 1  . KRILL OIL OMEGA-3 PO Take by mouth daily.    . metFORMIN  (GLUCOPHAGE ) 500 MG tablet TAKE 1 TABLET BY MOUTH ONCE DAILY BEFORE LUNCH 90 tablet 3  . Multiple Vitamins-Minerals (ALIVE WOMENS 50+ PO) Take by mouth daily.    . polyethylene glycol powder (GLYCOLAX /MIRALAX ) 17 GM/SCOOP powder Take 17 g by mouth daily. Drink 17g (1 scoop) dissolved in water  per day. 255 g 0  . Probiotic Product (PROBIOTIC DAILY) CAPS Take 1 capsule by mouth daily.    . traMADol  (ULTRAM ) 50 MG tablet Take 1 tablet by mouth every 6 (six) hours as needed for moderate pain or severe pain.    .  triamterene -hydrochlorothiazide  (MAXZIDE-25) 37.5-25 MG tablet Take 1 tablet by mouth daily. 90 tablet 1  . TRUEplus Lancets 33G MISC Use to check glucose once a day.  Dx code: E11.9 100 each 1  . TURMERIC PO Take 1 capsule by mouth daily.    . celecoxib (CELEBREX) 100 MG capsule Take 1 capsule by mouth daily. (Patient not taking: Reported on 08/07/2023)    . escitalopram  (LEXAPRO ) 20 MG tablet Take 1 tablet (20 mg total) by mouth daily for 7 days. 7 tablet 0   No facility-administered medications prior to visit.    Allergies  Allergen Reactions  . Meloxicam  Nausea Only and Nausea And Vomiting    ROS     Objective:     Physical Exam Constitutional:      General: She is not in acute distress.    Appearance: Normal appearance. She is well-developed. She is obese. She is not toxic-appearing.  HENT:     Head: Normocephalic and atraumatic.     Right Ear: External ear normal.     Left Ear: External ear normal.     Nose: Nose normal.  Eyes:     General:        Right eye: No discharge.        Left eye: No discharge.     Conjunctiva/sclera: Conjunctivae normal.  Neck:     Thyroid : No thyromegaly.  Cardiovascular:     Rate and Rhythm: Normal rate and regular rhythm.     Heart sounds: Normal heart sounds. No murmur heard. Pulmonary:     Effort: Pulmonary effort is normal. No respiratory distress.     Breath sounds: Normal breath sounds.  Abdominal:     General: Bowel sounds are normal.     Palpations: Abdomen is soft.     Tenderness: There is no abdominal tenderness. There is no guarding.  Musculoskeletal:  General: Normal range of motion.     Cervical back: Neck supple.  Lymphadenopathy:     Cervical: No cervical adenopathy.  Skin:    General: Skin is warm and dry.     Findings: Rash present.     Comments: Irregularly shaped raised lesion, slightly violaceous and tender over caudal aspect of right foot at base of great toe  Neurological:     Mental Status: She is  alert and oriented to person, place, and time.  Psychiatric:        Mood and Affect: Mood normal.        Behavior: Behavior normal.        Thought Content: Thought content normal.        Judgment: Judgment normal.   BP 124/72   Pulse 90   Resp 16   Ht 5\' 7"  (1.702 m)   Wt 216 lb 12.8 oz (98.3 kg)   SpO2 98%   BMI 33.96 kg/m  Wt Readings from Last 3 Encounters:  08/07/23 216 lb 12.8 oz (98.3 kg)  05/28/23 214 lb 6.4 oz (97.3 kg)  03/18/23 216 lb (98 kg)    Diabetic Foot Exam - Simple   No data filed    Lab Results  Component Value Date   WBC 7.2 03/18/2023   HGB 14.5 03/18/2023   HCT 44.1 03/18/2023   PLT 328 03/18/2023   GLUCOSE 126 (H) 05/28/2023   CHOL 147 02/10/2023   TRIG 142.0 02/10/2023   HDL 49.20 02/10/2023   LDLDIRECT 91.0 05/29/2021   LDLCALC 69 02/10/2023   ALT 19 03/18/2023   AST 21 03/18/2023   NA 140 05/28/2023   K 4.0 05/28/2023   CL 106 05/28/2023   CREATININE 1.46 (H) 05/28/2023   BUN 27 (H) 05/28/2023   CO2 25 05/28/2023   TSH 2.778 03/18/2023   HGBA1C 7.0 (H) 05/28/2023   MICROALBUR <0.7 02/10/2023    Lab Results  Component Value Date   TSH 2.778 03/18/2023   Lab Results  Component Value Date   WBC 7.2 03/18/2023   HGB 14.5 03/18/2023   HCT 44.1 03/18/2023   MCV 89.3 03/18/2023   PLT 328 03/18/2023   Lab Results  Component Value Date   NA 140 05/28/2023   K 4.0 05/28/2023   CO2 25 05/28/2023   GLUCOSE 126 (H) 05/28/2023   BUN 27 (H) 05/28/2023   CREATININE 1.46 (H) 05/28/2023   BILITOT 0.5 03/18/2023   ALKPHOS 77 03/18/2023   AST 21 03/18/2023   ALT 19 03/18/2023   PROT 8.1 03/18/2023   ALBUMIN 3.9 03/18/2023   CALCIUM  9.5 05/28/2023   ANIONGAP 12 03/18/2023   GFR 34.48 (L) 05/28/2023   Lab Results  Component Value Date   CHOL 147 02/10/2023   Lab Results  Component Value Date   HDL 49.20 02/10/2023   Lab Results  Component Value Date   LDLCALC 69 02/10/2023   Lab Results  Component Value Date   TRIG  142.0 02/10/2023   Lab Results  Component Value Date   CHOLHDL 3 02/10/2023   Lab Results  Component Value Date   HGBA1C 7.0 (H) 05/28/2023       Assessment & Plan:  Pulmonary nodule Assessment & Plan: Left lung incidental finding, 5 mm will repeat CT scan to assess for stability  Orders: -     CT CHEST WO CONTRAST; Future  Chronic obstructive pulmonary disease, unspecified COPD type (HCC) Assessment & Plan: She smoked from age 55 til  her 40s but only a ppweek. Does not sob on stairs but that is stable and  not worsening. Consider referral to pulmonary in future if symptoms worsen or do not resolve  Orders: -     CT CHEST WO CONTRAST; Future  Type 2 diabetes mellitus with obesity (HCC) Assessment & Plan: Taking  Metformin  500 mg po qam, minimize simple carbs. Increase exercise as tolerated. Continue current meds. Given rx for glucometer, lancets and test strips, check sugars daily and as needed  Orders: -     Microalbumin / creatinine urine ratio  Muscle spasm  Primary hypertension Assessment & Plan: Well controlled, no changes to meds. Encouraged heart healthy diet such as the DASH diet and exercise as tolerated.   Orders: -     Comprehensive metabolic panel with GFR -     CBC with Differential/Platelet -     TSH  Mixed hyperlipidemia Assessment & Plan: Encourage heart healthy diet such as MIND or DASH diet, increase exercise, avoid trans fats, simple carbohydrates and processed foods, consider a krill or fish or flaxseed oil cap daily. Tolerating Atorvastatin .   Orders: -     Lipid panel  Right foot pain Assessment & Plan: Pain and what looks like a small vascular lesion at base of right great toe. No redness or fluctuance. She denies any trauma but notes it hurts more with weight baring and pressure. Check xray today and patient will report if symptoms worsen.   Orders: -     DG Foot Complete Right; Future    Assessment and Plan Assessment &  Plan Right foot pain Persistent deep pain at the base of the right toe, possible varicose vein, further investigation needed to rule out bone pathology. - Order x-ray of the right foot to rule out bone infection or other underlying issues.  Pulmonary nodule 5 mm simple pulmonary nodule identified, no calcification or spiculation, monitoring for stability planned. - Order repeat CT scan of the lung to assess the pulmonary nodule. - Advise her to report any new chest pain or respiratory symptoms. - Provide a handout on pulmonary nodules.  Diabetes mellitus type 2 Diabetes managed with metformin , recent A1c 7, reasonable control, advised to monitor blood glucose levels. - Advise her to check blood glucose twice a week, including once fasting and once after the largest meal. - Continue metformin  therapy.  General Health Maintenance Routine health maintenance addressed, last colonoscopy in 2018, typical follow-up interval 10 years. - Perform full set of laboratory tests including kidney function, thyroid , cholesterol, and blood count. - Schedule follow-up for lab results and further management.  Follow-up Follow-up plans in place for continuity of care, cardiologist referral recommended post lung evaluation. - Schedule follow-up appointment with Heidi Llamas or Camilo Cella in the summer, possibly in August. - Refer to cardiologist for follow-up as recommended, after lung evaluation.      Randie Bustle, MD

## 2023-08-07 NOTE — Assessment & Plan Note (Signed)
 She smoked from age 78 til her 15s but only a ppweek. Does not sob on stairs but that is stable and  not worsening. Consider referral to pulmonary in future if symptoms worsen or do not resolve

## 2023-08-07 NOTE — Assessment & Plan Note (Signed)
 Taking  Metformin  500 mg po qam, minimize simple carbs. Increase exercise as tolerated. Continue current meds. Given rx for glucometer, lancets and test strips, check sugars daily and as needed

## 2023-08-07 NOTE — Assessment & Plan Note (Signed)
 Encourage heart healthy diet such as MIND or DASH diet, increase exercise, avoid trans fats, simple carbohydrates and processed foods, consider a krill or fish or flaxseed oil cap daily. Tolerating Atorvastatin

## 2023-08-07 NOTE — Assessment & Plan Note (Signed)
 Well controlled, no changes to meds. Encouraged heart healthy diet such as the DASH diet and exercise as tolerated.

## 2023-08-07 NOTE — Assessment & Plan Note (Signed)
 Left lung incidental finding, 5 mm will repeat CT scan to assess for stability

## 2023-08-07 NOTE — Telephone Encounter (Signed)
 Copied from CRM [id number]. Topic: Clinical - Lab/Test Results >> Aug 06, 2023  2:14 PM Juleen Oakland F wrote: Reason for CRM: Patient received letter regarding results from CT from 2023 and lab results from 05/28/23 and would like a call back at [phone]  Patient's call was returned and she was advised that order can be placed for another CT and a referral to pulmonary. Patient scheduled appointment for 10:20 AM today,08/07/23.

## 2023-08-12 ENCOUNTER — Ambulatory Visit: Payer: Self-pay | Admitting: Family Medicine

## 2023-08-13 ENCOUNTER — Other Ambulatory Visit: Payer: Self-pay | Admitting: Family Medicine

## 2023-08-13 DIAGNOSIS — M79671 Pain in right foot: Secondary | ICD-10-CM

## 2023-08-13 DIAGNOSIS — M19071 Primary osteoarthritis, right ankle and foot: Secondary | ICD-10-CM

## 2023-08-14 ENCOUNTER — Telehealth: Payer: Self-pay | Admitting: Family Medicine

## 2023-08-14 NOTE — Telephone Encounter (Signed)
 Copied from CRM [id number]. Topic: General - Other >> Aug 14, 2023  1:35 PM Baldo Levan wrote: Reason for CRM: Nae calling in regarding a ct prior authorization for the patient that she needs more information on, asking what kind of nodule the patient has . Please contact her back at fax at [phone]

## 2023-08-15 NOTE — Telephone Encounter (Signed)
 Need more clarification from Nae but no return number was listed to contact Nae only a fax number.

## 2023-08-18 ENCOUNTER — Telehealth: Payer: Self-pay | Admitting: Family Medicine

## 2023-08-18 NOTE — Telephone Encounter (Signed)
 Copied from CRM [id number]. Topic: Referral - Question >> Aug 15, 2023 11:08 AM Alethia Huxley E wrote: Reason for CRM: Patient called in regarding her orthopedic referral. Patient stated this referral got sent to a facility in [place] and she would prefer this get sent somewhere in [facility], as that would work better for her. Callback number for patient is [phone].

## 2023-08-18 NOTE — Telephone Encounter (Signed)
 Patient was advised that referral was placed to [facility].

## 2023-08-20 ENCOUNTER — Ambulatory Visit: Payer: Self-pay | Admitting: *Deleted

## 2023-08-20 NOTE — Telephone Encounter (Addendum)
 Copied from CRM [id number]. Topic: Clinical - Red Word Triage >> Aug 20, 2023  3:04 PM [name] P wrote: Red Word that prompted transfer to Nurse Triage: shortness of breath, loss of appetite, cough , loss voice   ----------------------------------------------------------------------- From previous Reason for Contact - Scheduling: Patient/patient representative is calling to schedule an appointment. Refer to attachments for appointment information. Reason for Disposition  [1] Continuous (nonstop) coughing interferes with work or school AND [2] no improvement using cough treatment per Care Advice  Answer Assessment - Initial Assessment Questions 1. ONSET: When did the cough begin?      Pt is hoarse, coughing and my 97.2 temperature, 92% on pulse oximeter, short of breath, fatigued.   Poor appetite.    Started Friday. 2. SEVERITY: How bad is the cough today?      I'm coughing and my voice is almost gone.   I'm having body aches too. 3. SPUTUM: Describe the color of your sputum (none, dry cough; clear, white, yellow, green)     I'm having shortness of breath with coughing.   4. HEMOPTYSIS: Are you coughing up any blood? If so ask: How much? (flecks, streaks, tablespoons, etc.)     Not asked 5. DIFFICULTY BREATHING: Are you having difficulty breathing? If Yes, ask: How bad is it? (e.g., mild, moderate, severe)    - MILD: No SOB at rest, mild SOB with walking, speaks normally in sentences, can lie down, no retractions, pulse < 100.    - MODERATE: SOB at rest, SOB with minimal exertion and prefers to sit, cannot lie down flat, speaks in phrases, mild retractions, audible wheezing, pulse 100-120.    - SEVERE: Very SOB at rest, speaks in single words, struggling to breathe, sitting hunched forward, retractions, pulse > 120      Yes shortness of breath with coughing.   6. FEVER: Do you have a fever? If Yes, ask: What is your temperature, how was it measured, and when did it start?      No fever 7. CARDIAC HISTORY: Do you have any history of heart disease? (e.g., heart attack, congestive heart failure)      Not asked 8. LUNG HISTORY: Do you have any history of lung disease?  (e.g., pulmonary embolus, asthma, emphysema)     No 9. PE RISK FACTORS: Do you have a history of blood clots? (or: recent major surgery, recent prolonged travel, bedridden)     Not asked 10. OTHER SYMPTOMS: Do you have any other symptoms? (e.g., runny nose, wheezing, chest pain)       Loss of appetite.   No diarrhea or vomiting 11. PREGNANCY: Is there any chance you are pregnant? When was your last menstrual period?       N/A 12. TRAVEL: Have you traveled out of the country in the last month? (e.g., travel history, exposures)       N/A  Protocols used: Cough - Acute Non-Productive-A-AH FYI Only or Action Required?: FYI only for provider  Patient was last seen in primary care on 08/07/2023 by Neda Balk, MD. Called Nurse Triage reporting Cough. Symptoms began a week ago. Interventions attempted: Rest, hydration, or home remedies. Symptoms are: gradually worsening.  Triage Disposition: See Physician Within 24 Hours  Patient/caregiver understands and will follow disposition?: Yes

## 2023-08-21 ENCOUNTER — Ambulatory Visit (HOSPITAL_BASED_OUTPATIENT_CLINIC_OR_DEPARTMENT_OTHER)
Admission: RE | Admit: 2023-08-21 | Discharge: 2023-08-21 | Disposition: A | Discharge status: Home / Self Care | Source: Ambulatory Visit | Attending: Student | Admitting: Student

## 2023-08-21 ENCOUNTER — Ambulatory Visit: Payer: Self-pay | Admitting: Student

## 2023-08-21 ENCOUNTER — Other Ambulatory Visit (HOSPITAL_BASED_OUTPATIENT_CLINIC_OR_DEPARTMENT_OTHER): Payer: Self-pay

## 2023-08-21 ENCOUNTER — Encounter: Payer: Self-pay | Admitting: Student

## 2023-08-21 ENCOUNTER — Ambulatory Visit (INDEPENDENT_AMBULATORY_CARE_PROVIDER_SITE_OTHER): Admitting: Student

## 2023-08-21 VITALS — BP 116/66 | HR 95 | Temp 97.9°F | Resp 16 | Ht 67.0 in | Wt 213.6 lb

## 2023-08-21 DIAGNOSIS — R918 Other nonspecific abnormal finding of lung field: Secondary | ICD-10-CM | POA: Diagnosis not present

## 2023-08-21 DIAGNOSIS — J441 Chronic obstructive pulmonary disease with (acute) exacerbation: Secondary | ICD-10-CM | POA: Insufficient documentation

## 2023-08-21 DIAGNOSIS — R0602 Shortness of breath: Secondary | ICD-10-CM | POA: Insufficient documentation

## 2023-08-21 DIAGNOSIS — R051 Acute cough: Secondary | ICD-10-CM | POA: Insufficient documentation

## 2023-08-21 DIAGNOSIS — I517 Cardiomegaly: Secondary | ICD-10-CM | POA: Diagnosis not present

## 2023-08-21 HISTORY — DX: Acute cough: R05.1

## 2023-08-21 HISTORY — DX: Shortness of breath: R06.02

## 2023-08-21 LAB — COMPREHENSIVE METABOLIC PANEL WITH GFR
ALT: 11 U/L (ref 0–35)
AST: 14 U/L (ref 0–37)
Albumin: 3.7 g/dL (ref 3.5–5.2)
Alkaline Phosphatase: 85 U/L (ref 39–117)
BUN: 26 mg/dL — ABNORMAL HIGH (ref 6–23)
CO2: 23 meq/L (ref 19–32)
Calcium: 8.9 mg/dL (ref 8.4–10.5)
Chloride: 100 meq/L (ref 96–112)
Creatinine, Ser: 1.27 mg/dL — ABNORMAL HIGH (ref 0.40–1.20)
GFR: 40.7 mL/min — ABNORMAL LOW (ref >60.00)
Glucose, Bld: 120 mg/dL — ABNORMAL HIGH (ref 70–99)
Potassium: 4.2 meq/L (ref 3.5–5.1)
Sodium: 136 meq/L (ref 135–145)
Total Bilirubin: 0.5 mg/dL (ref 0.2–1.2)
Total Protein: 7.7 g/dL (ref 6.0–8.3)

## 2023-08-21 LAB — CBC WITH DIFFERENTIAL/PLATELET
Basophils Absolute: 0.1 10*3/uL (ref 0.0–0.1)
Basophils Relative: 0.8 % (ref 0.0–3.0)
Eosinophils Absolute: 0.1 10*3/uL (ref 0.0–0.7)
Eosinophils Relative: 1.7 % (ref 0.0–5.0)
HCT: 38.7 % (ref 36.0–46.0)
Hemoglobin: 12.9 g/dL (ref 12.0–15.0)
Lymphocytes Relative: 27.5 % (ref 12.0–46.0)
Lymphs Abs: 2.2 10*3/uL (ref 0.7–4.0)
MCHC: 33.4 g/dL (ref 30.0–36.0)
MCV: 87.3 fl (ref 78.0–100.0)
Monocytes Absolute: 0.8 10*3/uL (ref 0.1–1.0)
Monocytes Relative: 9.8 % (ref 3.0–12.0)
Neutro Abs: 4.8 10*3/uL (ref 1.4–7.7)
Neutrophils Relative %: 60.2 % (ref 43.0–77.0)
Platelets: 370 10*3/uL (ref 150.0–400.0)
RBC: 4.44 Mil/uL (ref 3.87–5.11)
RDW: 15.1 % (ref 11.5–15.5)
WBC: 8 10*3/uL (ref 4.0–10.5)

## 2023-08-21 LAB — POC COVID19 BINAXNOW: SARS Coronavirus 2 Ag: NEGATIVE

## 2023-08-21 LAB — POCT INFLUENZA A/B
Influenza A, POC: NEGATIVE
Influenza B, POC: NEGATIVE

## 2023-08-21 MED ORDER — IPRATROPIUM-ALBUTEROL 0.5-2.5 (3) MG/3ML IN SOLN
3.0000 mL | Freq: Once | RESPIRATORY_TRACT | Status: AC
  Administered 2023-08-21: 3 mL via RESPIRATORY_TRACT

## 2023-08-21 MED ORDER — ALBUTEROL SULFATE HFA 108 (90 BASE) MCG/ACT IN AERS
1.0000 | INHALATION_SPRAY | RESPIRATORY_TRACT | 2 refills | Status: AC | PRN
  Filled 2023-08-21: qty 6.7, 25d supply, fill #0

## 2023-08-21 MED ORDER — AZITHROMYCIN 250 MG PO TABS
ORAL_TABLET | ORAL | 0 refills | Status: DC
  Filled 2023-08-21: qty 6, 5d supply, fill #0

## 2023-08-21 MED ORDER — PREDNISONE 10 MG PO TABS
ORAL_TABLET | ORAL | 0 refills | Status: DC
Start: 2023-08-21 — End: 2023-08-27
  Filled 2023-08-21: qty 14, 6d supply, fill #0

## 2023-08-21 NOTE — Assessment & Plan Note (Addendum)
 Chest x-ray r/o PNA, results pending

## 2023-08-21 NOTE — Assessment & Plan Note (Addendum)
 O2 sat 91% on RA.  DuoNeb nebulizer in office increased O2 sat to 94%.  Azithromycin Z-pack and prednisone taper sent to pharmacy.  Albuterol prn inhaler for SOB.  Patient to return to clinic to reassess next week.  Discuss daily maintenance inhalers at FU visit, consider pulmonology referral.

## 2023-08-21 NOTE — Progress Notes (Signed)
 Subjective:     Patient ID: Brenda [name], female    DOB: 1945-04-20, 78 y.o.   MRN: [id number]  Chief Complaint  Patient presents with   Cough   Shortness of Breath   poor appetite   Hoarse    HPI  Brenda [name] is 78 year old female who presents with shortness of breath, cough, increased sputum, cough, body ache. Patient went to graduation last Wednesday and symptoms started on last Friday.  Has history of dyspnea on exertion, pulmonary nodule, COPD.   Cough: yes Productive cough: yes, Color: yellow Increased SOB Sinusitis Risk Factors Fever:  uncertian Headache/face pain: no  Double sickening: no  Tooth pain: no  Hemoptysis: Denies Sneezing: no  Itchy scratchy throat: no  Seasonal sx: no  Headache: no  Muscle aches: yes  Severe fatigue: yes  Stiff neck: no  Dyspnea: yes  Rash: no  Swallowing difficulty: no     Patient denies fever, chills, CP, palpitations, edema, HA, vision changes, N/V/D, abdominal pain, urinary symptoms, rash, weight changes, and recent illness or hospitalizations.      History of Present Illness              Health Maintenance Due  Topic Date Due   Zoster Vaccines- Shingrix (1 of 2) 10/19/1964   DTaP/Tdap/Td (2 - Td or Tdap) 04/16/2021   Colonoscopy  04/22/2021   Medicare Annual Wellness (AWV)  08/30/2023    Past Medical History:  Diagnosis Date   Anxiety 10/17/2010   Carpal tunnel syndrome on right 01/20/2012   Coronary artery calcification seen on CAT scan    cardiologist--- dr Lafayette Pierre   DDD (degenerative disc disease), lumbar    Depression    DOE (dyspnea on exertion)    GERD (gastroesophageal reflux disease)    History of obstructive sleep apnea    (04-29-2022  per pt was retested , no issue)  study in epic 05-01-2014 by dr Loetta Ringer [facility] 16.9 / hr w/ nocturnal oxygen saturation   History of uterine fibroid    Hyperlipidemia, mixed    2011   Hypertension    OA (osteoarthritis)    Osteopenia 03/19/2016   Prolapse  of anterior vaginal wall    Prolapse of vaginal vault after hysterectomy    Seasonal allergies    SUI (stress urinary incontinence, female) 08/24/2012    Past Surgical History:  Procedure Laterality Date   ANTERIOR AND POSTERIOR REPAIR WITH SACROSPINOUS FIXATION N/A 05/30/2022   Procedure: ANTERIOR  REPAIR WITH SACROSPINOUS FIXATION;  Surgeon: Arma Lamp, MD;  Location: [facility];  Service: Gynecology;  Laterality: N/A;   BLADDER SUSPENSION N/A 05/30/2022   Procedure: TRANSVAGINAL TAPE (TVT) PROCEDURE;  Surgeon: Arma Lamp, MD;  Location: [facility];  Service: Gynecology;  Laterality: N/A;   COLONOSCOPY  2018   CYSTOSCOPY N/A 05/30/2022   Procedure: CYSTOSCOPY;  Surgeon: Arma Lamp, MD;  Location: [facility];  Service: Gynecology;  Laterality: N/A;   PERINEOPLASTY  05/30/2022   Procedure: PERINEOPLASTY;  Surgeon: Arma Lamp, MD;  Location: [facility];  Service: Gynecology;;   TOTAL VAGINAL HYSTERECTOMY  2004   @[facility] ;   W/   BILATERAL SALPINOOPHORECTOMY AND SLING PROCEDURE    Family History  Problem Relation Age of Onset   Dementia Mother    Heart disease Father 31   Hypertension Father    Colon cancer Sister 74   Dementia Sister    Breast cancer Sister 60  Thyroid  disease Sister    Depression Sister        anxiety   Obesity Daughter    Cancer Paternal Grandfather        bone    Social History   Socioeconomic History   Marital status: Divorced    Spouse name: Not on file   Number of children: Not on file   Years of education: Not on file   Highest education level: Not on file  Occupational History   Not on file  Tobacco Use   Smoking status: Former    Current packs/day: 0.00    Types: Cigarettes    Start date: 31    Quit date: 68    Years since quitting: 50.4   Smokeless tobacco: Never  Vaping Use   Vaping status: Never Used  Substance and Sexual  Activity   Alcohol  use: Not Currently    Comment: very rare   Drug use: Never   Sexual activity: Yes  Other Topics Concern   Not on file  Social History Narrative   Not on file   Social Drivers of Health   Financial Resource Strain: Medium Risk (08/30/2022)   Overall Financial Resource Strain (CARDIA)    Difficulty of Paying Living Expenses: Somewhat hard  Food Insecurity: No Food Insecurity (08/30/2022)   Hunger Vital Sign    Worried About Running Out of Food in the Last Year: Never true    Ran Out of Food in the Last Year: Never true  Transportation Needs: No Transportation Needs (08/30/2022)   PRAPARE - Administrator, Civil Service (Medical): No    Lack of Transportation (Non-Medical): No  Physical Activity: Inactive (08/30/2022)   Exercise Vital Sign    Days of Exercise per Week: 0 days    Minutes of Exercise per Session: 0 min  Stress: No Stress Concern Present (08/23/2020)   [employer] of Occupational Health - Occupational Stress Questionnaire    Feeling of Stress : Not at all  Social Connections: Moderately Isolated (08/23/2020)   Social Connection and Isolation Panel    Frequency of Communication with Friends and Family: More than three times a week    Frequency of Social Gatherings with Friends and Family: More than three times a week    Attends Religious Services: More than 4 times per year    Active Member of [employer] or Organizations: No    Attends Banker Meetings: Never    Marital Status: Divorced  Catering manager Violence: Not At Risk (08/30/2022)   Humiliation, Afraid, Rape, and Kick questionnaire    Fear of Current or Ex-Partner: No    Emotionally Abused: No    Physically Abused: No    Sexually Abused: No    Outpatient Medications Prior to Visit  Medication Sig Dispense Refill   acetaminophen  (TYLENOL ) 500 MG tablet Take 1 tablet (500 mg total) by mouth every 6 (six) hours as needed (pain). 30 tablet 0   Alcohol  Swabs  70 % PADS  Use as directed once a day. 100 each 1   aspirin 81 MG tablet Take 81 mg by mouth daily.     atorvastatin  (LIPITOR) 80 MG tablet Take 1 tablet (80 mg total) by mouth daily. 90 tablet 1   blood glucose meter kit and supplies Dispense based on patient and insurance preference. Use Prn to check blood suagr 1 each 1   Blood Glucose Monitoring Suppl (TRUE METRIX AIR GLUCOSE METER) w/Device KIT Use to check glucose once a  day.  Dx code: E11.9 1 kit 0   Calcium  Citrate-Vitamin D (CALCIUM  CITRATE + D) 250-5 MG-MCG TABS Take 2 tablets by mouth daily at 12 noon.     cholecalciferol (VITAMIN D3) 25 MCG (1000 UNIT) tablet Take 1,000 Units by mouth daily.     Cyanocobalamin (VITAMIN B 12 PO) Take 1,000 Units by mouth daily.     escitalopram  (LEXAPRO ) 20 MG tablet Take 1 tablet (20 mg total) by mouth daily for 7 days. 7 tablet 0   escitalopram  (LEXAPRO ) 20 MG tablet Take 2 tablets (40 mg total) by mouth daily. 180 tablet 2   famotidine  (PEPCID ) 40 MG tablet TAKE 1 TABLET EVERY DAY 90 tablet 3   glucose blood (TRUE METRIX BLOOD GLUCOSE TEST) test strip Use to check glucose once a day.  Dx code: E11.9 100 each 1   irbesartan  (AVAPRO ) 150 MG tablet Take 1 tablet (150 mg total) by mouth daily. 90 tablet 1   KRILL OIL OMEGA-3 PO Take by mouth daily.     metFORMIN  (GLUCOPHAGE ) 500 MG tablet TAKE 1 TABLET BY MOUTH ONCE DAILY BEFORE LUNCH 90 tablet 3   Multiple Vitamins-Minerals (ALIVE WOMENS 50+ PO) Take by mouth daily.     polyethylene glycol powder (GLYCOLAX /MIRALAX ) 17 GM/SCOOP powder Take 17 g by mouth daily. Drink 17g (1 scoop) dissolved in water  per day. 255 g 0   Probiotic Product (PROBIOTIC DAILY) CAPS Take 1 capsule by mouth daily.     traMADol  (ULTRAM ) 50 MG tablet Take 1 tablet by mouth every 6 (six) hours as needed for moderate pain or severe pain.     triamterene -hydrochlorothiazide  (MAXZIDE-25) 37.5-25 MG tablet Take 1 tablet by mouth daily. 90 tablet 1   TRUEplus Lancets 33G MISC Use to check glucose  once a day.  Dx code: E11.9 100 each 1   TURMERIC PO Take 1 capsule by mouth daily.     celecoxib (CELEBREX) 100 MG capsule Take 1 capsule by mouth daily. (Patient not taking: Reported on 08/07/2023)     No facility-administered medications prior to visit.    Allergies  Allergen Reactions   Meloxicam  Nausea Only and Nausea And Vomiting    ROS  See HPI    Objective:    Physical Exam  HEENT: - Head: Normocephalic, atraumatic. - Eyes: Conjunctivae clear, no scleral icterus. - Ears: Tympanic membranes clear bilaterally, no bulging or erythema. - Nose: Nasal mucosa mildly erythematous, congested; no purulent discharge. - Throat: Mucosa moist. NO erythema of the posterior pharynx. No tonsillar exudate or swelling. Uvula midline.  - Neck: Supple. No lymphadenopathy or neck stiffness. No thyromegaly or tenderness. Respiratory: Lungs diminished. No wheezing, rales, or rhonchi. No respiratory distress. +DOE Cardiovascular: Regular rate and rhythm. No murmurs, rubs, or gallops. -Skin: Warm, dry. No rash. -Neurological: Alert and oriented x3. No focal deficits.   BP 116/66 (BP Location: Left Arm, Patient Position: Sitting, Cuff Size: Large)   Pulse 95   Temp 97.9 F (36.6 C) (Oral)   Resp 16   Ht 5' 7 (1.702 m)   Wt 213 lb 9.6 oz (96.9 kg)   SpO2 94% Comment: Post nebulizer Tx  BMI 33.45 kg/m  Wt Readings from Last 3 Encounters:  08/21/23 213 lb 9.6 oz (96.9 kg)  08/07/23 216 lb 12.8 oz (98.3 kg)  05/28/23 214 lb 6.4 oz (97.3 kg)       Assessment & Plan:   Problem List Items Addressed This Visit     Acute cough   Negative  COVID and flu      Relevant Orders   POCT Influenza A/B   POC COVID-19 BinaxNow   COPD exacerbation (HCC)   O2 sat 91% on RA.  DuoNeb nebulizer in office increased O2 sat to 94%.  Azithromycin Z-pack and prednisone taper sent to pharmacy.  Albuterol prn inhaler for SOB.  Patient to return to clinic to reassess next week.  Discuss daily maintenance  inhalers at FU visit, consider pulmonology referral.      Relevant Medications   azithromycin (ZITHROMAX) 250 MG tablet   predniSONE (DELTASONE) 10 MG tablet   albuterol (VENTOLIN HFA) 108 (90 Base) MCG/ACT inhaler   SOB (shortness of breath) - Primary   Chest x-ray r/o PNA, results pending      Relevant Medications   albuterol (VENTOLIN HFA) 108 (90 Base) MCG/ACT inhaler   Other Relevant Orders   POCT Influenza A/B   POC COVID-19 BinaxNow   DG Chest 2 View   CBC with Differential/Platelet   Comp Met (CMET)    I am having Janelle Mediate start on azithromycin, predniSONE, and albuterol. I am also having her maintain her aspirin, Probiotic Daily, TURMERIC PO, Cyanocobalamin (VITAMIN B 12 PO), traMADol , blood glucose meter kit and supplies, acetaminophen , polyethylene glycol powder, Multiple Vitamins-Minerals (ALIVE WOMENS 50+ PO), KRILL OIL OMEGA-3 PO, True Metrix Air Glucose Meter, True Metrix Blood Glucose Test, TRUEplus Lancets 33G, Alcohol  Swabs , metFORMIN , Calcium  Citrate + D, cholecalciferol, celecoxib, escitalopram , famotidine , triamterene -hydrochlorothiazide , irbesartan , atorvastatin , and escitalopram . We administered ipratropium-albuterol.  Meds ordered this encounter  Medications   ipratropium-albuterol (DUONEB) 0.5-2.5 (3) MG/3ML nebulizer solution 3 mL   azithromycin (ZITHROMAX) 250 MG tablet    Sig: Take 2 tablets on day 1, then 1 tablet daily on days 2 through 5    Dispense:  6 tablet    Refill:  0    Supervising Provider:   Randie Bustle A [[id number]]   predniSONE (DELTASONE) 10 MG tablet    Sig: Take 4 tablets (40 mg total) by mouth daily with breakfast for 2 days, THEN 2 tablets (20 mg total) daily with breakfast for 2 days, THEN 1 tablet (10 mg total) daily with breakfast for 2 days.    Dispense:  14 tablet    Refill:  0    Supervising Provider:   Randie Bustle A [[id number]]   albuterol (VENTOLIN HFA) 108 (90 Base) MCG/ACT inhaler    Sig: Inhale 1-2 puffs into the lungs  every 4 (four) hours as needed for wheezing or shortness of breath.    Dispense:  8 g    Refill:  2    Supervising Provider:   Randie Bustle A [[id number]]

## 2023-08-21 NOTE — Assessment & Plan Note (Signed)
Negative COVID and flu

## 2023-08-26 DIAGNOSIS — J189 Pneumonia, unspecified organism: Secondary | ICD-10-CM | POA: Insufficient documentation

## 2023-08-26 HISTORY — DX: Pneumonia, unspecified organism: J18.9

## 2023-08-26 NOTE — Assessment & Plan Note (Signed)
 Patient completed course of azithromycin .  She still has shortness of breath using albuterol  as needed.  Will Rx. Amox-clav 875 mg twice daily x 10 days.  Lung CT ordered for 4 weeks.  Patient to return to clinic or seek emergency care if increased shortness of breath, fever or systemic systems persist or worsen

## 2023-08-26 NOTE — Progress Notes (Unsigned)
 Subjective:     Patient ID: Brenda [name], female    DOB: Sep 15, 1945, 78 y.o.   MRN: [id number]  No chief complaint on file.   HPI  Brenda [name]  is a 78 yo female who presents for follow-up.  Has history of DOE, pulm nodule and, COPD.  She was seen in clinic last week for SOB.  CXR 08/21/2022-IMPRESSION: 1. Right mid lung, lingular and right middle lobe patchy opacities, suspicious for multifocal pneumonia. 2. Mild cardiomegaly.  Echo: January 2024 EF 60-65%,Grade I diastolic  dysfunction (impaired relaxation).    Patient denies fever, chills, SOB, CP, palpitations, dyspnea, edema, HA, vision changes, N/V/D, abdominal pain, urinary symptoms, rash, weight changes, and recent illness or hospitalizations.    History of Present Illness              Health Maintenance Due  Topic Date Due   Zoster Vaccines- Shingrix (1 of 2) 10/19/1964   DTaP/Tdap/Td (2 - Td or Tdap) 04/16/2021   Colonoscopy  04/22/2021   Medicare Annual Wellness (AWV)  08/30/2023    Past Medical History:  Diagnosis Date   Anxiety 10/17/2010   Carpal tunnel syndrome on right 01/20/2012   Coronary artery calcification seen on CAT scan    cardiologist--- dr edwyna   DDD (degenerative disc disease), lumbar    Depression    DOE (dyspnea on exertion)    GERD (gastroesophageal reflux disease)    History of obstructive sleep apnea    (04-29-2022  per pt was retested , no issue)  study in epic 05-01-2014 by dr burnard [facility] 16.9 / hr w/ nocturnal oxygen saturation   History of uterine fibroid    Hyperlipidemia, mixed    2011   Hypertension    OA (osteoarthritis)    Osteopenia 03/19/2016   Prolapse of anterior vaginal wall    Prolapse of vaginal vault after hysterectomy    Seasonal allergies    SUI (stress urinary incontinence, female) 08/24/2012    Past Surgical History:  Procedure Laterality Date   ANTERIOR AND POSTERIOR REPAIR WITH SACROSPINOUS FIXATION N/A 05/30/2022   Procedure: ANTERIOR   REPAIR WITH SACROSPINOUS FIXATION;  Surgeon: Marilynne Rosaline SAILOR, MD;  Location: [facility];  Service: Gynecology;  Laterality: N/A;   BLADDER SUSPENSION N/A 05/30/2022   Procedure: TRANSVAGINAL TAPE (TVT) PROCEDURE;  Surgeon: Marilynne Rosaline SAILOR, MD;  Location: [facility];  Service: Gynecology;  Laterality: N/A;   COLONOSCOPY  2018   CYSTOSCOPY N/A 05/30/2022   Procedure: CYSTOSCOPY;  Surgeon: Marilynne Rosaline SAILOR, MD;  Location: [facility];  Service: Gynecology;  Laterality: N/A;   PERINEOPLASTY  05/30/2022   Procedure: PERINEOPLASTY;  Surgeon: Marilynne Rosaline SAILOR, MD;  Location: [facility];  Service: Gynecology;;   TOTAL VAGINAL HYSTERECTOMY  2004   @[facility] ;   W/   BILATERAL SALPINOOPHORECTOMY AND SLING PROCEDURE    Family History  Problem Relation Age of Onset   Dementia Mother    Heart disease Father 27   Hypertension Father    Colon cancer Sister 9   Dementia Sister    Breast cancer Sister 4   Thyroid  disease Sister    Depression Sister        anxiety   Obesity Daughter    Cancer Paternal Grandfather        bone    Social History   Socioeconomic History   Marital status: Divorced    Spouse name: Not on file   Number  of children: Not on file   Years of education: Not on file   Highest education level: Not on file  Occupational History   Not on file  Tobacco Use   Smoking status: Former    Current packs/day: 0.00    Types: Cigarettes    Start date: 27    Quit date: 87    Years since quitting: 50.5   Smokeless tobacco: Never  Vaping Use   Vaping status: Never Used  Substance and Sexual Activity   Alcohol  use: Not Currently    Comment: very rare   Drug use: Never   Sexual activity: Yes  Other Topics Concern   Not on file  Social History Narrative   Not on file   Social Drivers of Health   Financial Resource Strain: Medium Risk (08/30/2022)   Overall Financial Resource Strain (CARDIA)     Difficulty of Paying Living Expenses: Somewhat hard  Food Insecurity: No Food Insecurity (08/30/2022)   Hunger Vital Sign    Worried About Running Out of Food in the Last Year: Never true    Ran Out of Food in the Last Year: Never true  Transportation Needs: No Transportation Needs (08/30/2022)   PRAPARE - Administrator, Civil Service (Medical): No    Lack of Transportation (Non-Medical): No  Physical Activity: Inactive (08/30/2022)   Exercise Vital Sign    Days of Exercise per Week: 0 days    Minutes of Exercise per Session: 0 min  Stress: No Stress Concern Present (08/23/2020)   [employer] of Occupational Health - Occupational Stress Questionnaire    Feeling of Stress : Not at all  Social Connections: Moderately Isolated (08/23/2020)   Social Connection and Isolation Panel    Frequency of Communication with Friends and Family: More than three times a week    Frequency of Social Gatherings with Friends and Family: More than three times a week    Attends Religious Services: More than 4 times per year    Active Member of [employer] or Organizations: No    Attends Banker Meetings: Never    Marital Status: Divorced  Catering manager Violence: Not At Risk (08/30/2022)   Humiliation, Afraid, Rape, and Kick questionnaire    Fear of Current or Ex-Partner: No    Emotionally Abused: No    Physically Abused: No    Sexually Abused: No    Outpatient Medications Prior to Visit  Medication Sig Dispense Refill   acetaminophen  (TYLENOL ) 500 MG tablet Take 1 tablet (500 mg total) by mouth every 6 (six) hours as needed (pain). 30 tablet 0   albuterol  (VENTOLIN  HFA) 108 (90 Base) MCG/ACT inhaler Inhale 1-2 puffs into the lungs every 4 (four) hours as needed for wheezing or shortness of breath. 6.7 g 2   Alcohol  Swabs  70 % PADS Use as directed once a day. 100 each 1   aspirin 81 MG tablet Take 81 mg by mouth daily.     atorvastatin  (LIPITOR) 80 MG tablet Take 1 tablet (80  mg total) by mouth daily. 90 tablet 1   azithromycin  (ZITHROMAX ) 250 MG tablet Take 2 tablets on day 1, then 1 tablet daily on days 2 through 5 6 tablet 0   blood glucose meter kit and supplies Dispense based on patient and insurance preference. Use Prn to check blood suagr 1 each 1   Blood Glucose Monitoring Suppl (TRUE METRIX AIR GLUCOSE METER) w/Device KIT Use to check glucose once a day.  Dx  code: E11.9 1 kit 0   Calcium  Citrate-Vitamin D (CALCIUM  CITRATE + D) 250-5 MG-MCG TABS Take 2 tablets by mouth daily at 12 noon.     celecoxib (CELEBREX) 100 MG capsule Take 1 capsule by mouth daily. (Patient not taking: Reported on 08/07/2023)     cholecalciferol (VITAMIN D3) 25 MCG (1000 UNIT) tablet Take 1,000 Units by mouth daily.     Cyanocobalamin (VITAMIN B 12 PO) Take 1,000 Units by mouth daily.     escitalopram  (LEXAPRO ) 20 MG tablet Take 1 tablet (20 mg total) by mouth daily for 7 days. 7 tablet 0   escitalopram  (LEXAPRO ) 20 MG tablet Take 2 tablets (40 mg total) by mouth daily. 180 tablet 2   famotidine  (PEPCID ) 40 MG tablet TAKE 1 TABLET EVERY DAY 90 tablet 3   glucose blood (TRUE METRIX BLOOD GLUCOSE TEST) test strip Use to check glucose once a day.  Dx code: E11.9 100 each 1   irbesartan  (AVAPRO ) 150 MG tablet Take 1 tablet (150 mg total) by mouth daily. 90 tablet 1   KRILL OIL OMEGA-3 PO Take by mouth daily.     metFORMIN  (GLUCOPHAGE ) 500 MG tablet TAKE 1 TABLET BY MOUTH ONCE DAILY BEFORE LUNCH 90 tablet 3   Multiple Vitamins-Minerals (ALIVE WOMENS 50+ PO) Take by mouth daily.     polyethylene glycol powder (GLYCOLAX /MIRALAX ) 17 GM/SCOOP powder Take 17 g by mouth daily. Drink 17g (1 scoop) dissolved in water  per day. 255 g 0   predniSONE  (DELTASONE ) 10 MG tablet Take 4 tablets (40 mg total) by mouth daily with breakfast for 2 days, THEN 2 tablets (20 mg total) daily with breakfast for 2 days, THEN 1 tablet (10 mg total) daily with breakfast for 2 days. 14 tablet 0   Probiotic Product  (PROBIOTIC DAILY) CAPS Take 1 capsule by mouth daily.     traMADol  (ULTRAM ) 50 MG tablet Take 1 tablet by mouth every 6 (six) hours as needed for moderate pain or severe pain.     triamterene -hydrochlorothiazide  (MAXZIDE-25) 37.5-25 MG tablet Take 1 tablet by mouth daily. 90 tablet 1   TRUEplus Lancets 33G MISC Use to check glucose once a day.  Dx code: E11.9 100 each 1   TURMERIC PO Take 1 capsule by mouth daily.     No facility-administered medications prior to visit.    Allergies  Allergen Reactions   Meloxicam  Nausea Only and Nausea And Vomiting    ROS See HPI    Objective:    Physical Exam  General: No acute distress. Awake and conversant.  Eyes: Normal conjunctiva, anicteric. Round symmetric pupils.  ENT: Hearing grossly intact. No nasal discharge.  Neck: Neck is supple. No masses or thyromegaly.  Respiratory: CTAB. Respirations are non-labored. No wheezing.  Skin: Warm. No rashes or ulcers.  Psych: Alert and oriented. Cooperative, Appropriate mood and affect, Normal judgment.  CV: RRR. No murmur. No lower extremity edema.  MSK: Normal ambulation. No clubbing or cyanosis.  Neuro:  CN II-XII grossly normal.     There were no vitals taken for this visit. Wt Readings from Last 3 Encounters:  08/21/23 213 lb 9.6 oz (96.9 kg)  08/07/23 216 lb 12.8 oz (98.3 kg)  05/28/23 214 lb 6.4 oz (97.3 kg)       Assessment & Plan:   Problem List Items Addressed This Visit     Pneumonia of right middle lobe due to infectious organism - Primary    I am having Brenda [name] maintain her  aspirin, Probiotic Daily, TURMERIC PO, Cyanocobalamin (VITAMIN B 12 PO), traMADol , blood glucose meter kit and supplies, acetaminophen , polyethylene glycol powder, Multiple Vitamins-Minerals (ALIVE WOMENS 50+ PO), KRILL OIL OMEGA-3 PO, True Metrix Air Glucose Meter, True Metrix Blood Glucose Test, TRUEplus Lancets 33G, Alcohol  Swabs , metFORMIN , Calcium  Citrate + D, cholecalciferol, celecoxib,  escitalopram , famotidine , triamterene -hydrochlorothiazide , irbesartan , atorvastatin , escitalopram , azithromycin , predniSONE , and albuterol .  No orders of the defined types were placed in this encounter.

## 2023-08-27 ENCOUNTER — Ambulatory Visit (INDEPENDENT_AMBULATORY_CARE_PROVIDER_SITE_OTHER): Admitting: Student

## 2023-08-27 ENCOUNTER — Other Ambulatory Visit (HOSPITAL_BASED_OUTPATIENT_CLINIC_OR_DEPARTMENT_OTHER): Payer: Self-pay

## 2023-08-27 VITALS — BP 128/76 | HR 83 | Temp 97.9°F | Resp 16 | Ht 67.0 in | Wt 211.8 lb

## 2023-08-27 DIAGNOSIS — J189 Pneumonia, unspecified organism: Secondary | ICD-10-CM | POA: Diagnosis not present

## 2023-08-27 MED ORDER — AMOXICILLIN-POT CLAVULANATE 875-125 MG PO TABS
1.0000 | ORAL_TABLET | Freq: Two times a day (BID) | ORAL | 0 refills | Status: DC
  Filled 2023-08-27: qty 20, 10d supply, fill #0

## 2023-08-27 MED ORDER — IPRATROPIUM-ALBUTEROL 0.5-2.5 (3) MG/3ML IN SOLN
3.0000 mL | Freq: Once | RESPIRATORY_TRACT | Status: AC
  Administered 2023-08-27: 3 mL via RESPIRATORY_TRACT

## 2023-08-27 NOTE — Addendum Note (Signed)
 Addended by: ESTELLE GILLIS D on: 08/27/2023 04:43 PM   Modules accepted: Orders

## 2023-08-27 NOTE — Addendum Note (Signed)
 Addended by: ESTELLE GILLIS D on: 08/27/2023 01:30 PM   Modules accepted: Orders

## 2023-08-27 NOTE — Addendum Note (Signed)
 Addended by: ESTELLE GILLIS D on: 08/27/2023 04:25 PM   Modules accepted: Orders

## 2023-08-30 ENCOUNTER — Other Ambulatory Visit: Payer: Self-pay | Admitting: Family Medicine

## 2023-08-30 DIAGNOSIS — E669 Obesity, unspecified: Secondary | ICD-10-CM

## 2023-09-02 ENCOUNTER — Encounter: Payer: Medicare HMO | Admitting: Family Medicine

## 2023-09-02 ENCOUNTER — Other Ambulatory Visit (HOSPITAL_BASED_OUTPATIENT_CLINIC_OR_DEPARTMENT_OTHER): Payer: Self-pay

## 2023-09-02 ENCOUNTER — Encounter: Payer: Self-pay | Admitting: Student

## 2023-09-02 NOTE — Addendum Note (Signed)
 Addended by: WHEELER HARLENE CROME on: 09/02/2023 09:43 AM   Modules accepted: Level of Service

## 2023-09-10 NOTE — Progress Notes (Addendum)
 Subjective:     Patient ID: Brenda [name], female    DOB: [birth year], 78 y.o.   MRN: [id number]  Chief Complaint  Patient presents with   Follow-up    HPI  CHENELLE BENNING  is a 78 yo female who presents for follow-up.  Hx of DOE, pulm nodule and, COPD.  She was seen in clinic last week for SOB CXR showed R mid lob PNA.  Patient has completed course of Augmentin  and Z-Pak.  Patient reports feeling somewhat better overall but continues to experience shortness of breath, particularly with exertion.  She has not seen cardiology since Jan 2024.    Echo: January 2024 EF 60-65%,Grade I diastolic  dysfunction (impaired relaxation).   HTN Maxide-25: 37.5?mg triamterene  / 25?mg hydrochlorothiazide  daily  Type II DM Metformin  500 mg daily  HLD Atorvastatin  80 mg daily, Krill oil omega-3 p.o.  Anxiety/depression Lexapro  40 mg daily  GERD Famotidine  (Pepcid ) 40 mg daily  Patient reports taking medications prescribed.  Reports no adverse side effects.  Patient denies fever, chills,  CP, palpitations, dyspnea, edema, HA, vision changes, N/V/D, abdominal pain, urinary symptoms, rash, weight changes, and recent illness or hospitalizations.    History of Present Illness              Health Maintenance Due  Topic Date Due   Zoster Vaccines- Shingrix (1 of 2) 10/19/1964    Past Medical History:  Diagnosis Date   Anxiety 10/17/2010   Carpal tunnel syndrome on right 01/20/2012   Coronary artery calcification seen on CAT scan    cardiologist--- dr edwyna   DDD (degenerative disc disease), lumbar    Depression    DOE (dyspnea on exertion)    GERD (gastroesophageal reflux disease)    History of obstructive sleep apnea    (04-29-2022  per pt was retested , no issue)  study in epic 05-01-2014 by dr burnard [facility] 16.9 / hr w/ nocturnal oxygen saturation   History of uterine fibroid    Hyperlipidemia, mixed    2011   Hypertension    OA (osteoarthritis)    Osteopenia 03/19/2016    Prolapse of anterior vaginal wall    Prolapse of vaginal vault after hysterectomy    Seasonal allergies    SUI (stress urinary incontinence, female) 08/24/2012    Past Surgical History:  Procedure Laterality Date   ANTERIOR AND POSTERIOR REPAIR WITH SACROSPINOUS FIXATION N/A 05/30/2022   Procedure: ANTERIOR  REPAIR WITH SACROSPINOUS FIXATION;  Surgeon: Marilynne Rosaline SAILOR, MD;  Location: [facility];  Service: Gynecology;  Laterality: N/A;   BLADDER SUSPENSION N/A 05/30/2022   Procedure: TRANSVAGINAL TAPE (TVT) PROCEDURE;  Surgeon: Marilynne Rosaline SAILOR, MD;  Location: [facility];  Service: Gynecology;  Laterality: N/A;   COLONOSCOPY  2018   CYSTOSCOPY N/A 05/30/2022   Procedure: CYSTOSCOPY;  Surgeon: Marilynne Rosaline SAILOR, MD;  Location: [facility];  Service: Gynecology;  Laterality: N/A;   PERINEOPLASTY  05/30/2022   Procedure: PERINEOPLASTY;  Surgeon: Marilynne Rosaline SAILOR, MD;  Location: [facility];  Service: Gynecology;;   TOTAL VAGINAL HYSTERECTOMY  2004   @[facility] ;   W/   BILATERAL SALPINOOPHORECTOMY AND SLING PROCEDURE    Family History  Problem Relation Age of Onset   Dementia Mother    Heart disease Father 62   Hypertension Father    Colon cancer Sister 28   Dementia Sister    Breast cancer Sister 103   Thyroid  disease Sister  Depression Sister        anxiety   Obesity Daughter    Cancer Paternal Grandfather        bone    Social History   Socioeconomic History   Marital status: Divorced    Spouse name: Not on file   Number of children: Not on file   Years of education: Not on file   Highest education level: Not on file  Occupational History   Not on file  Tobacco Use   Smoking status: Former    Current packs/day: 0.00    Types: Cigarettes    Start date: 55    Quit date: 45    Years since quitting: 50.5   Smokeless tobacco: Never  Vaping Use   Vaping status: Never Used  Substance and  Sexual Activity   Alcohol  use: Not Currently    Comment: very rare   Drug use: Never   Sexual activity: Yes  Other Topics Concern   Not on file  Social History Narrative   Not on file   Social Drivers of Health   Financial Resource Strain: Medium Risk (08/30/2022)   Overall Financial Resource Strain (CARDIA)    Difficulty of Paying Living Expenses: Somewhat hard  Food Insecurity: No Food Insecurity (08/30/2022)   Hunger Vital Sign    Worried About Running Out of Food in the Last Year: Never true    Ran Out of Food in the Last Year: Never true  Transportation Needs: No Transportation Needs (08/30/2022)   PRAPARE - Administrator, Civil Service (Medical): No    Lack of Transportation (Non-Medical): No  Physical Activity: Inactive (08/30/2022)   Exercise Vital Sign    Days of Exercise per Week: 0 days    Minutes of Exercise per Session: 0 min  Stress: No Stress Concern Present (08/23/2020)   [employer] of Occupational Health - Occupational Stress Questionnaire    Feeling of Stress : Not at all  Social Connections: Moderately Isolated (08/23/2020)   Social Connection and Isolation Panel    Frequency of Communication with Friends and Family: More than three times a week    Frequency of Social Gatherings with Friends and Family: More than three times a week    Attends Religious Services: More than 4 times per year    Active Member of [employer] or Organizations: No    Attends Banker Meetings: Never    Marital Status: Divorced  Catering manager Violence: Not At Risk (08/30/2022)   Humiliation, Afraid, Rape, and Kick questionnaire    Fear of Current or Ex-Partner: No    Emotionally Abused: No    Physically Abused: No    Sexually Abused: No    Outpatient Medications Prior to Visit  Medication Sig Dispense Refill   acetaminophen  (TYLENOL ) 500 MG tablet Take 1 tablet (500 mg total) by mouth every 6 (six) hours as needed (pain). 30 tablet 0   albuterol   (VENTOLIN  HFA) 108 (90 Base) MCG/ACT inhaler Inhale 1-2 puffs into the lungs every 4 (four) hours as needed for wheezing or shortness of breath. 6.7 g 2   Alcohol  Swabs  70 % PADS Use as directed once a day. 100 each 1   aspirin 81 MG tablet Take 81 mg by mouth daily.     atorvastatin  (LIPITOR) 80 MG tablet Take 1 tablet (80 mg total) by mouth daily. 90 tablet 1   blood glucose meter kit and supplies Dispense based on patient and insurance preference. Use Prn to  check blood suagr 1 each 1   Blood Glucose Monitoring Suppl (TRUE METRIX AIR GLUCOSE METER) w/Device KIT Use to check glucose once a day.  Dx code: E11.9 1 kit 0   Calcium  Citrate-Vitamin D (CALCIUM  CITRATE + D) 250-5 MG-MCG TABS Take 2 tablets by mouth daily at 12 noon.     cholecalciferol (VITAMIN D3) 25 MCG (1000 UNIT) tablet Take 1,000 Units by mouth daily.     Cyanocobalamin (VITAMIN B 12 PO) Take 1,000 Units by mouth daily.     escitalopram  (LEXAPRO ) 20 MG tablet Take 2 tablets (40 mg total) by mouth daily. 180 tablet 2   famotidine  (PEPCID ) 40 MG tablet TAKE 1 TABLET EVERY DAY 90 tablet 3   glucose blood (TRUE METRIX BLOOD GLUCOSE TEST) test strip Use to check glucose once a day.  Dx code: E11.9 100 each 1   irbesartan  (AVAPRO ) 150 MG tablet Take 1 tablet (150 mg total) by mouth daily. 90 tablet 1   KRILL OIL OMEGA-3 PO Take by mouth daily.     Multiple Vitamins-Minerals (ALIVE WOMENS 50+ PO) Take by mouth daily.     polyethylene glycol powder (GLYCOLAX /MIRALAX ) 17 GM/SCOOP powder Take 17 g by mouth daily. Drink 17g (1 scoop) dissolved in water  per day. 255 g 0   Probiotic Product (PROBIOTIC DAILY) CAPS Take 1 capsule by mouth daily.     traMADol  (ULTRAM ) 50 MG tablet Take 1 tablet by mouth every 6 (six) hours as needed for moderate pain or severe pain.     triamterene -hydrochlorothiazide  (MAXZIDE-25) 37.5-25 MG tablet Take 1 tablet by mouth daily. 90 tablet 1   TRUEplus Lancets 33G MISC Use to check glucose once a day.  Dx code:  E11.9 100 each 1   TURMERIC PO Take 1 capsule by mouth daily.     escitalopram  (LEXAPRO ) 20 MG tablet Take 1 tablet (20 mg total) by mouth daily for 7 days. 7 tablet 0   metFORMIN  (GLUCOPHAGE ) 500 MG tablet Take 1 tablet (500 mg total) by mouth daily before lunch. 90 tablet 0   No facility-administered medications prior to visit.    Allergies  Allergen Reactions   Meloxicam  Nausea Only and Nausea And Vomiting    ROS See HPI    Objective:    Physical Exam  General: No acute distress. Awake and conversant.  Eyes: Normal conjunctiva, anicteric. Round symmetric pupils.  ENT: Hearing grossly intact. No nasal discharge.  Respiratory:  Respirations are non-labored. +Cough +wheezing Skin: Warm. No rashes or ulcers.  Psych: Alert and oriented. Cooperative, Appropriate mood and affect, Normal judgment.  CV: RRR. No murmur. No lower extremity edema.  MSK: Normal ambulation. No clubbing or cyanosis.  Neuro:  CN II-XII grossly normal.     BP 116/78 (BP Location: Right Arm, Patient Position: Sitting, Cuff Size: Large)   Pulse 86   Temp 97.8 F (36.6 C) (Oral)   Resp 12   Ht 5' 7 (1.702 m)   Wt 211 lb 12.8 oz (96.1 kg)   SpO2 92%   BMI 33.17 kg/m  Wt Readings from Last 3 Encounters:  09/11/23 211 lb 12.8 oz (96.1 kg)  08/27/23 211 lb 12.8 oz (96.1 kg)  08/21/23 213 lb 9.6 oz (96.9 kg)       Assessment & Plan:   Problem List Items Addressed This Visit     Chronic obstructive pulmonary disease (HCC)   Former smoker- from age 89 til her 10s but only a ppweek.  Referred to pulmonology for further  evaluation. Rx- Spiriva  inhaler for daily maintenance.  FU 1 month to reassess.      Relevant Medications   budesonide -formoterol  (SYMBICORT ) 80-4.5 MCG/ACT inhaler   Other Relevant Orders   Ambulatory referral to Pulmonology   DOE (dyspnea on exertion)   Relevant Orders   Ambulatory referral to Cardiology   Hyperlipidemia   Encourage heart healthy diet such as MIND or DASH  diet, increase exercise, avoid trans fats, simple carbohydrates and processed foods, consider a krill or fish or flaxseed oil cap daily. Tolerating Atorvastatin .       Hypertension   Well controlled, no changes to meds. Encouraged heart healthy diet such as the DASH diet and exercise as tolerate       Pulmonary nodule - Primary   Hx of Pulmonary nodule - Repeat CT scan of the lung to assess the pulmonary nodule. - Advise her to report any new chest pain or respiratory symptoms.       Renal insufficiency   GFR 35-40.  Refer to nephrology for further evaluation.      Relevant Orders   Ambulatory referral to Nephrology   SOB (shortness of breath)   Refer to pulmonology for further assessment.      Relevant Orders   Ambulatory referral to Pulmonology   Type 2 diabetes mellitus with obesity (HCC)   Lab Results  Component Value Date   HGBA1C 8.4 (H) 09/11/2023    Increase Metformin  to 500 mg BID PO. Increase exercise as tolerated, minimize simple carbs. Recheck CMP and A1C at FU.      Relevant Orders   Hemoglobin A1c (Completed)   Urine Microalbumin w/creat. ratio (Completed)   Right mid lung PNA sequela Patient has completed course of azithromycin  and Augmentin . Albuterol  inhaler as needed for shortness of breath.  Patient denies fever, chills, myalgia.  Repeat chest CT today.  CXR 08/21/2022-IMPRESSION: 1. Right mid lung, lingular and right middle lobe patchy opacities, suspicious for multifocal pneumonia. 2. Mild cardiomegaly.  Portions of this note were dictated using DRAGON voice recognition software. Please disregard any errors in transcription.     I am having Lauraine CANDIE Sharps start on budesonide -formoterol . I am also having her maintain her aspirin, Probiotic Daily, TURMERIC PO, Cyanocobalamin (VITAMIN B 12 PO), traMADol , blood glucose meter kit and supplies, acetaminophen , polyethylene glycol powder, Multiple Vitamins-Minerals (ALIVE WOMENS 50+ PO), KRILL OIL OMEGA-3  PO, True Metrix Air Glucose Meter, True Metrix Blood Glucose Test, TRUEplus Lancets 33G, Alcohol  Swabs , Calcium  Citrate + D, cholecalciferol, famotidine , triamterene -hydrochlorothiazide , irbesartan , atorvastatin , escitalopram , and albuterol .  Meds ordered this encounter  Medications   budesonide -formoterol  (SYMBICORT ) 80-4.5 MCG/ACT inhaler    Sig: Inhale 2 puffs into the lungs 2 (two) times daily.    Dispense:  10.2 g    Refill:  0    Supervising Provider:   DOMENICA BLACKBIRD A [[id number]]

## 2023-09-10 NOTE — Assessment & Plan Note (Signed)
 Hx of Pulmonary nodule - Repeat CT scan of the lung to assess the pulmonary nodule. - Advise her to report any new chest pain or respiratory symptoms.

## 2023-09-10 NOTE — Assessment & Plan Note (Signed)
 Former smoker- from age 78 til her 81s but only a ppweek. Does not sob on stairs but that is stable and not worsening.

## 2023-09-10 NOTE — Assessment & Plan Note (Signed)
 Refer to pulmonology for further assessment.

## 2023-09-11 ENCOUNTER — Other Ambulatory Visit (HOSPITAL_BASED_OUTPATIENT_CLINIC_OR_DEPARTMENT_OTHER): Payer: Self-pay

## 2023-09-11 ENCOUNTER — Ambulatory Visit (INDEPENDENT_AMBULATORY_CARE_PROVIDER_SITE_OTHER): Admitting: Student

## 2023-09-11 ENCOUNTER — Ambulatory Visit (HOSPITAL_BASED_OUTPATIENT_CLINIC_OR_DEPARTMENT_OTHER)
Admission: RE | Admit: 2023-09-11 | Discharge: 2023-09-11 | Disposition: A | Discharge status: Home / Self Care | Source: Ambulatory Visit | Attending: Family Medicine | Admitting: Family Medicine

## 2023-09-11 ENCOUNTER — Encounter: Payer: Self-pay | Admitting: Student

## 2023-09-11 VITALS — BP 116/78 | HR 86 | Temp 97.8°F | Resp 12 | Ht 67.0 in | Wt 211.8 lb

## 2023-09-11 DIAGNOSIS — N289 Disorder of kidney and ureter, unspecified: Secondary | ICD-10-CM | POA: Diagnosis not present

## 2023-09-11 DIAGNOSIS — I10 Essential (primary) hypertension: Secondary | ICD-10-CM

## 2023-09-11 DIAGNOSIS — R0602 Shortness of breath: Secondary | ICD-10-CM

## 2023-09-11 DIAGNOSIS — J449 Chronic obstructive pulmonary disease, unspecified: Secondary | ICD-10-CM | POA: Insufficient documentation

## 2023-09-11 DIAGNOSIS — R911 Solitary pulmonary nodule: Secondary | ICD-10-CM

## 2023-09-11 DIAGNOSIS — R0609 Other forms of dyspnea: Secondary | ICD-10-CM

## 2023-09-11 DIAGNOSIS — I700 Atherosclerosis of aorta: Secondary | ICD-10-CM | POA: Diagnosis not present

## 2023-09-11 DIAGNOSIS — E782 Mixed hyperlipidemia: Secondary | ICD-10-CM | POA: Diagnosis not present

## 2023-09-11 DIAGNOSIS — E1169 Type 2 diabetes mellitus with other specified complication: Secondary | ICD-10-CM

## 2023-09-11 DIAGNOSIS — Z7984 Long term (current) use of oral hypoglycemic drugs: Secondary | ICD-10-CM

## 2023-09-11 DIAGNOSIS — C3432 Malignant neoplasm of lower lobe, left bronchus or lung: Secondary | ICD-10-CM | POA: Diagnosis not present

## 2023-09-11 DIAGNOSIS — E669 Obesity, unspecified: Secondary | ICD-10-CM

## 2023-09-11 DIAGNOSIS — J439 Emphysema, unspecified: Secondary | ICD-10-CM | POA: Diagnosis not present

## 2023-09-11 HISTORY — DX: Disorder of kidney and ureter, unspecified: N28.9

## 2023-09-11 MED ORDER — BUDESONIDE-FORMOTEROL FUMARATE 80-4.5 MCG/ACT IN AERO
2.0000 | INHALATION_SPRAY | Freq: Two times a day (BID) | RESPIRATORY_TRACT | 0 refills | Status: DC
  Filled 2023-09-11 (×2): qty 10.2, 30d supply, fill #0

## 2023-09-11 NOTE — Assessment & Plan Note (Signed)
 Well controlled, no changes to meds. Encouraged heart healthy diet such as the DASH diet and exercise as tolerate

## 2023-09-11 NOTE — Assessment & Plan Note (Signed)
 GFR 35-40.  Refer to nephrology for further evaluation.

## 2023-09-11 NOTE — Assessment & Plan Note (Signed)
 Encourage heart healthy diet such as MIND or DASH diet, increase exercise, avoid trans fats, simple carbohydrates and processed foods, consider a krill or fish or flaxseed oil cap daily. Tolerating Atorvastatin

## 2023-09-11 NOTE — Assessment & Plan Note (Addendum)
 Lab Results  Component Value Date   HGBA1C 8.4 (H) 09/11/2023    Increase Metformin  to 500 mg BID PO. Increase exercise as tolerated, minimize simple carbs. Recheck CMP and A1C at FU.

## 2023-09-12 ENCOUNTER — Other Ambulatory Visit (HOSPITAL_BASED_OUTPATIENT_CLINIC_OR_DEPARTMENT_OTHER): Payer: Self-pay

## 2023-09-12 ENCOUNTER — Other Ambulatory Visit: Payer: Self-pay | Admitting: Family Medicine

## 2023-09-12 DIAGNOSIS — J449 Chronic obstructive pulmonary disease, unspecified: Secondary | ICD-10-CM

## 2023-09-12 LAB — CBC WITH DIFFERENTIAL/PLATELET
Basophils Absolute: 0 K/uL (ref 0.0–0.1)
Basophils Relative: 1.1 % (ref 0.0–3.0)
Eosinophils Absolute: 0.1 K/uL (ref 0.0–0.7)
Eosinophils Relative: 2.8 % (ref 0.0–5.0)
HCT: 38.8 % (ref 36.0–46.0)
Hemoglobin: 12.9 g/dL (ref 12.0–15.0)
Lymphocytes Relative: 53.7 % — ABNORMAL HIGH (ref 12.0–46.0)
Lymphs Abs: 2.3 K/uL (ref 0.7–4.0)
MCHC: 33.2 g/dL (ref 30.0–36.0)
MCV: 88 fl (ref 78.0–100.0)
Monocytes Absolute: 0.4 K/uL (ref 0.1–1.0)
Monocytes Relative: 10.1 % (ref 3.0–12.0)
Neutro Abs: 1.4 K/uL (ref 1.4–7.7)
Neutrophils Relative %: 32.3 % — ABNORMAL LOW (ref 43.0–77.0)
Platelets: 437 K/uL — ABNORMAL HIGH (ref 150.0–400.0)
RBC: 4.41 Mil/uL (ref 3.87–5.11)
RDW: 15.4 % (ref 11.5–15.5)
WBC: 4.3 K/uL (ref 4.0–10.5)

## 2023-09-12 LAB — COMPREHENSIVE METABOLIC PANEL WITH GFR
ALT: 11 U/L (ref 0–35)
AST: 12 U/L (ref 0–37)
Albumin: 3.8 g/dL (ref 3.5–5.2)
Alkaline Phosphatase: 84 U/L (ref 39–117)
BUN: 29 mg/dL — ABNORMAL HIGH (ref 6–23)
CO2: 27 meq/L (ref 19–32)
Calcium: 9 mg/dL (ref 8.4–10.5)
Chloride: 102 meq/L (ref 96–112)
Creatinine, Ser: 1.2 mg/dL (ref 0.40–1.20)
GFR: 43.54 mL/min — ABNORMAL LOW (ref >60.00)
Glucose, Bld: 92 mg/dL (ref 70–99)
Potassium: 4.6 meq/L (ref 3.5–5.1)
Sodium: 137 meq/L (ref 135–145)
Total Bilirubin: 0.4 mg/dL (ref 0.2–1.2)
Total Protein: 7.2 g/dL (ref 6.0–8.3)

## 2023-09-12 LAB — LIPID PANEL
Cholesterol: 144 mg/dL (ref 0–200)
HDL: 52.5 mg/dL (ref >39.00)
LDL Cholesterol: 64 mg/dL (ref 0–99)
NonHDL: 91.19
Total CHOL/HDL Ratio: 3
Triglycerides: 138 mg/dL (ref 0.0–149.0)
VLDL: 27.6 mg/dL (ref 0.0–40.0)

## 2023-09-12 LAB — HEMOGLOBIN A1C: Hgb A1c MFr Bld: 8.4 % — ABNORMAL HIGH (ref 4.6–6.5)

## 2023-09-12 LAB — MICROALBUMIN / CREATININE URINE RATIO
Creatinine,U: 87.7 mg/dL
Microalb Creat Ratio: 25.3 mg/g (ref 0.0–30.0)
Microalb, Ur: 2.2 mg/dL — ABNORMAL HIGH (ref 0.0–1.9)

## 2023-09-12 LAB — TSH: TSH: 1.19 u[IU]/mL (ref 0.35–5.50)

## 2023-09-14 ENCOUNTER — Ambulatory Visit: Payer: Self-pay | Admitting: Student

## 2023-09-14 DIAGNOSIS — E1169 Type 2 diabetes mellitus with other specified complication: Secondary | ICD-10-CM

## 2023-09-14 MED ORDER — METFORMIN HCL 500 MG PO TABS
500.0000 mg | ORAL_TABLET | Freq: Two times a day (BID) | ORAL | 0 refills | Status: DC
Start: 2023-09-14 — End: 2023-12-10

## 2023-09-16 ENCOUNTER — Telehealth: Payer: Self-pay

## 2023-09-16 NOTE — Telephone Encounter (Signed)
 Patient was advised and verbalized understanding. She would like to move forward with starting the Jardiance or Farxiga. She reports pulmonary appointment was on September 18, 2023.

## 2023-09-16 NOTE — Telephone Encounter (Signed)
 Copied from CRM [id number]. Topic: Clinical - Lab/Test Results >> Sep 15, 2023 11:23 AM Laymon HERO wrote: Reason for CRM: Patient calling to talk about imaging she had done last week. Wanting a call back

## 2023-09-16 NOTE — Addendum Note (Signed)
 Addended by: WHEELER HARLENE CROME on: 09/16/2023 12:53 PM   Modules accepted: Orders

## 2023-09-16 NOTE — Telephone Encounter (Signed)
 Copied from CRM [id number]. Topic: Clinical - Medical Advice >> Sep 15, 2023 11:22 AM Laymon HERO wrote: Reason for CRM: Patient asking for a nurse to contact her to go over urine test that was done last week.

## 2023-09-17 ENCOUNTER — Telehealth: Payer: Self-pay

## 2023-09-17 NOTE — Progress Notes (Signed)
 Complex Care Management Note  Care Guide Note 09/17/2023 Name: Brenda [name] MRN: [id number] DOB: [birth year]  Brenda [name] is a 78 y.o. year old female who sees [name], MD for primary care. I reached out to [name] by phone today to offer complex care management services.  Ms. Calmes was given information about Complex Care Management services today including:   The Complex Care Management services include support from the care team which includes your Nurse Care Manager, Clinical Social Worker, or Pharmacist.  The Complex Care Management team is here to help remove barriers to the health concerns and goals most important to you. Complex Care Management services are voluntary, and the patient may decline or stop services at any time by request to their care team member.   Complex Care Management Consent Status: Patient agreed to services and verbal consent obtained.   Follow up plan:  Telephone appointment with complex care management team member scheduled for:  09/22/23 at 2:00 p.m.  Encounter Outcome:  Patient Scheduled  Dreama [facility]  [facility], [facility] Care Management Assistant Direct Dial: [phone]  Fax: [phone]

## 2023-09-17 NOTE — Telephone Encounter (Signed)
Patient was advised and verbalized understanding. 

## 2023-09-18 ENCOUNTER — Ambulatory Visit (INDEPENDENT_AMBULATORY_CARE_PROVIDER_SITE_OTHER): Admitting: Pulmonary Disease

## 2023-09-18 VITALS — BP 115/75 | HR 89 | Ht 67.0 in | Wt 214.0 lb

## 2023-09-18 DIAGNOSIS — J449 Chronic obstructive pulmonary disease, unspecified: Secondary | ICD-10-CM | POA: Diagnosis not present

## 2023-09-18 DIAGNOSIS — Z87891 Personal history of nicotine dependence: Secondary | ICD-10-CM | POA: Diagnosis not present

## 2023-09-18 MED ORDER — SPIRIVA RESPIMAT 2.5 MCG/ACT IN AERS
2.0000 | INHALATION_SPRAY | Freq: Every day | RESPIRATORY_TRACT | 5 refills | Status: DC
Start: 2023-09-18 — End: 2023-09-18

## 2023-09-18 MED ORDER — STIOLTO RESPIMAT 2.5-2.5 MCG/ACT IN AERS
2.0000 | INHALATION_SPRAY | Freq: Every day | RESPIRATORY_TRACT | 3 refills | Status: DC
Start: 2023-09-18 — End: 2023-09-23

## 2023-09-18 NOTE — Progress Notes (Signed)
 Brenda [name]    [id number]    1946/01/26  Primary Care Physician:Blyth, Harlene LABOR, MD  Referring Physician: Wheeler Harlene CROME, NP [address], [address] [place],  [place] [address]  Chief complaint:   Patient being seen for shortness of breath History of COPD  HPI:  Was recently treated for pneumonia Symptoms are overall improving She still has a cough with clear phlegm Cough was more pronounced earlier during the course of illness, mucus was yellow and now clear  Did have fevers, no longer having fevers  Shortness of breath with low oxygen levels noted prior to the treatments  Past history of smoking quit over 40 years ago  Has a history of hypertension, diabetes  Not aware of a diagnosis of COPD previously  Recently started on albuterol , there was mention of other inhalers but these were not started, 1 inhaler she was prescribed was very expensive to pick up  Denies chronic cough, denies chronic shortness of breath although lately has been more short of breath because of back pain and discomfort No chronic fatigue, no weight loss,  She does get winded with moderate to significant exertion   Outpatient Encounter Medications as of 09/18/2023  Medication Sig   acetaminophen  (TYLENOL ) 500 MG tablet Take 1 tablet (500 mg total) by mouth every 6 (six) hours as needed (pain).   albuterol  (VENTOLIN  HFA) 108 (90 Base) MCG/ACT inhaler Inhale 1-2 puffs into the lungs every 4 (four) hours as needed for wheezing or shortness of breath.   Alcohol  Swabs  70 % PADS Use as directed once a day.   aspirin 81 MG tablet Take 81 mg by mouth daily.   atorvastatin  (LIPITOR) 80 MG tablet Take 1 tablet (80 mg total) by mouth daily.   blood glucose meter kit and supplies Dispense based on patient and insurance preference. Use Prn to check blood suagr   Blood Glucose Monitoring Suppl (TRUE METRIX AIR GLUCOSE METER) w/Device KIT Use to check glucose once a day.  Dx code: E11.9    budesonide -formoterol  (SYMBICORT ) 80-4.5 MCG/ACT inhaler Inhale 2 puffs into the lungs 2 (two) times daily.   Calcium  Citrate-Vitamin D (CALCIUM  CITRATE + D) 250-5 MG-MCG TABS Take 2 tablets by mouth daily at 12 noon.   cholecalciferol (VITAMIN D3) 25 MCG (1000 UNIT) tablet Take 1,000 Units by mouth daily.   Cyanocobalamin (VITAMIN B 12 PO) Take 1,000 Units by mouth daily.   escitalopram  (LEXAPRO ) 20 MG tablet Take 2 tablets (40 mg total) by mouth daily.   famotidine  (PEPCID ) 40 MG tablet TAKE 1 TABLET EVERY DAY   glucose blood (TRUE METRIX BLOOD GLUCOSE TEST) test strip Use to check glucose once a day.  Dx code: E11.9   irbesartan  (AVAPRO ) 150 MG tablet Take 1 tablet (150 mg total) by mouth daily.   KRILL OIL OMEGA-3 PO Take by mouth daily.   metFORMIN  (GLUCOPHAGE ) 500 MG tablet Take 1 tablet (500 mg total) by mouth 2 (two) times daily with a meal.   Multiple Vitamins-Minerals (ALIVE WOMENS 50+ PO) Take by mouth daily.   polyethylene glycol powder (GLYCOLAX /MIRALAX ) 17 GM/SCOOP powder Take 17 g by mouth daily. Drink 17g (1 scoop) dissolved in water  per day.   Probiotic Product (PROBIOTIC DAILY) CAPS Take 1 capsule by mouth daily.   traMADol  (ULTRAM ) 50 MG tablet Take 1 tablet by mouth every 6 (six) hours as needed for moderate pain or severe pain.   triamterene -hydrochlorothiazide  (MAXZIDE-25) 37.5-25 MG tablet  Take 1 tablet by mouth daily.   TRUEplus Lancets 33G MISC Use to check glucose once a day.  Dx code: E11.9   TURMERIC PO Take 1 capsule by mouth daily.   No facility-administered encounter medications on file as of 09/18/2023.    Allergies as of 09/18/2023 - Review Complete 09/11/2023  Allergen Reaction Noted   Meloxicam  Nausea Only and Nausea And Vomiting 01/20/2012    Past Medical History:  Diagnosis Date   Anxiety 10/17/2010   Carpal tunnel syndrome on right 01/20/2012   Coronary artery calcification seen on CAT scan    cardiologist--- dr edwyna   DDD (degenerative disc  disease), lumbar    Depression    DOE (dyspnea on exertion)    GERD (gastroesophageal reflux disease)    History of obstructive sleep apnea    (04-29-2022  per pt was retested , no issue)  study in epic 05-01-2014 by dr burnard [facility] 16.9 / hr w/ nocturnal oxygen saturation   History of uterine fibroid    Hyperlipidemia, mixed    2011   Hypertension    OA (osteoarthritis)    Osteopenia 03/19/2016   Prolapse of anterior vaginal wall    Prolapse of vaginal vault after hysterectomy    Seasonal allergies    SUI (stress urinary incontinence, female) 08/24/2012    Past Surgical History:  Procedure Laterality Date   ANTERIOR AND POSTERIOR REPAIR WITH SACROSPINOUS FIXATION N/A 05/30/2022   Procedure: ANTERIOR  REPAIR WITH SACROSPINOUS FIXATION;  Surgeon: Marilynne Rosaline SAILOR, MD;  Location: [facility];  Service: Gynecology;  Laterality: N/A;   BLADDER SUSPENSION N/A 05/30/2022   Procedure: TRANSVAGINAL TAPE (TVT) PROCEDURE;  Surgeon: Marilynne Rosaline SAILOR, MD;  Location: [facility];  Service: Gynecology;  Laterality: N/A;   COLONOSCOPY  2018   CYSTOSCOPY N/A 05/30/2022   Procedure: CYSTOSCOPY;  Surgeon: Marilynne Rosaline SAILOR, MD;  Location: [facility];  Service: Gynecology;  Laterality: N/A;   PERINEOPLASTY  05/30/2022   Procedure: PERINEOPLASTY;  Surgeon: Marilynne Rosaline SAILOR, MD;  Location: [facility];  Service: Gynecology;;   TOTAL VAGINAL HYSTERECTOMY  2004   @[facility] ;   W/   BILATERAL SALPINOOPHORECTOMY AND SLING PROCEDURE    Family History  Problem Relation Age of Onset   Dementia Mother    Heart disease Father 19   Hypertension Father    Colon cancer Sister 75   Dementia Sister    Breast cancer Sister 36   Thyroid  disease Sister    Depression Sister        anxiety   Obesity Daughter    Cancer Paternal Grandfather        bone    Social History   Socioeconomic History   Marital status: Divorced    Spouse name:  Not on file   Number of children: Not on file   Years of education: Not on file   Highest education level: Not on file  Occupational History   Not on file  Tobacco Use   Smoking status: Former    Current packs/day: 0.00    Types: Cigarettes    Start date: 31    Quit date: 1975    Years since quitting: 50.5   Smokeless tobacco: Never  Vaping Use   Vaping status: Never Used  Substance and Sexual Activity   Alcohol  use: Not Currently    Comment: very rare   Drug use: Never   Sexual activity: Yes  Other Topics Concern  Not on file  Social History Narrative   Not on file   Social Drivers of Health   Financial Resource Strain: Medium Risk (08/30/2022)   Overall Financial Resource Strain (CARDIA)    Difficulty of Paying Living Expenses: Somewhat hard  Food Insecurity: No Food Insecurity (08/30/2022)   Hunger Vital Sign    Worried About Running Out of Food in the Last Year: Never true    Ran Out of Food in the Last Year: Never true  Transportation Needs: No Transportation Needs (08/30/2022)   PRAPARE - Administrator, Civil Service (Medical): No    Lack of Transportation (Non-Medical): No  Physical Activity: Inactive (08/30/2022)   Exercise Vital Sign    Days of Exercise per Week: 0 days    Minutes of Exercise per Session: 0 min  Stress: No Stress Concern Present (08/23/2020)   [employer] of Occupational Health - Occupational Stress Questionnaire    Feeling of Stress : Not at all  Social Connections: Moderately Isolated (08/23/2020)   Social Connection and Isolation Panel    Frequency of Communication with Friends and Family: More than three times a week    Frequency of Social Gatherings with Friends and Family: More than three times a week    Attends Religious Services: More than 4 times per year    Active Member of [employer] or Organizations: No    Attends Banker Meetings: Never    Marital Status: Divorced  Catering manager Violence: Not At  Risk (08/30/2022)   Humiliation, Afraid, Rape, and Kick questionnaire    Fear of Current or Ex-Partner: No    Emotionally Abused: No    Physically Abused: No    Sexually Abused: No    Review of Systems  Respiratory:  Positive for cough and shortness of breath.     There were no vitals filed for this visit.   Physical Exam Constitutional:      Appearance: She is obese.  HENT:     Head: Normocephalic.     Mouth/Throat:     Mouth: Mucous membranes are moist.  Eyes:     General: No scleral icterus. Cardiovascular:     Rate and Rhythm: Normal rate and regular rhythm.     Heart sounds: No murmur heard.    No friction rub.  Pulmonary:     Effort: No respiratory distress.     Breath sounds: No stridor. No wheezing or rhonchi.  Musculoskeletal:     Cervical back: No rigidity or tenderness.  Neurological:     Mental Status: She is alert.  Psychiatric:        Mood and Affect: Mood normal.     Data Reviewed: Echocardiogram 03/06/2022 with diastolic dysfunction, normal ejection fraction  Recent CT scan of the chest 09/11/2023 reviewed with the patient and compared with a cardiac CT that was performed 02/18/2022 Left lower lobe nodule is stable Extensive emphysema Multiple nodularities, areas of mucous plugging  Assessment:  Recent pneumonia - Completed course of antibiotics - Overall feeling better  Abnormal CT scan of the chest showing multifocal infiltrates, extensive emphysema  Chronic obstructive pulmonary disease with emphysema - Was prescribed Symbicort  - Symbicort  was very expensive to pick up - Stiolto appears to be on formulary-will initiate this at present - Albuterol  use as needed  Diastolic dysfunction - Risk factor modification  Concern for MAI from CT scan findings - Has not had chronic symptoms of cough, fatigue, chest discomfort - Most of her symptoms appear  to be acute  Class I obesity  Chronic musculoskeletal pain  Pulmonary nodule-left lower  lobe - Stable from previous  Type 2 diabetes  Hypertension   Plan/Recommendations: Schedule for CT scan of the chest - This will be performed about 8 weeks to allow for resolution of pneumonic infiltrate - Overall feeling clinically better  Stiolto prescribed  Use albuterol  as needed  Schedule for pulmonary function test  Encouraged to continue regular exercises, graded activities to get stronger  Extensive emphysematous changes noted on CT scan from 2023 with no other previous CT scans to compare with  Follow-up in about 8 weeks  Encouraged to call with significant concerns   Jennet Epley MD [facility] 09/18/2023, 1:16 PM  CC: Wheeler Harlene CROME, NP

## 2023-09-18 NOTE — Telephone Encounter (Signed)
 Reviewed her [facility] plan. She has $0 deductible. Bernadine is tier 3 and cost should be $47 / month.  I have appointment scheduled with patient to discuss further 09/22/2023

## 2023-09-18 NOTE — Patient Instructions (Signed)
 We will get a CAT scan in about 8 weeks to compare with your recent CAT scan, we are checking it to make sure the haziness in the lung clears up  We will get a breathing study on you to assess lung functions  You do have COPD/emphysema  The spot in the lung that was on your CAT scan from 2023 is stable  The haziness on the CAT scan can be chronic infections but, we will get the CAT scan in 8 weeks to check to make sure that the pneumonia you had recently clears up  Inhaler to be used daily-this will help your breathing  The albuterol  you have can be used up to 4 times a day as needed  Exercise regularly

## 2023-09-22 ENCOUNTER — Telehealth: Payer: Self-pay

## 2023-09-22 ENCOUNTER — Ambulatory Visit (INDEPENDENT_AMBULATORY_CARE_PROVIDER_SITE_OTHER): Admitting: Pharmacist

## 2023-09-22 DIAGNOSIS — E1169 Type 2 diabetes mellitus with other specified complication: Secondary | ICD-10-CM

## 2023-09-22 DIAGNOSIS — E669 Obesity, unspecified: Secondary | ICD-10-CM

## 2023-09-22 DIAGNOSIS — J449 Chronic obstructive pulmonary disease, unspecified: Secondary | ICD-10-CM

## 2023-09-22 NOTE — Telephone Encounter (Signed)
 PAP: Patient assistance application for Jardiance through [employer]) has been mailed to pt's home address on file. Provider portion of application will be faxed to provider's office.

## 2023-09-22 NOTE — Progress Notes (Signed)
 09/22/2023 Name: Brenda [name] MRN: [id number] DOB: 06-Aug-1945  Chief Complaint  Patient presents with   Medication Management   Diabetes    Brenda [name] is a 78 y.o. year old female who presented for a telephone visit.   They were referred to the pharmacist by their PCP for assistance in managing medication access.    Subjective:  Care Team: Primary Care Provider: [name], [name] ; Next Scheduled Visit: 11/17/2023 Cardiologist: Dr Brenda [name]; Next Scheduled Visit: 11/17/2023 Pulmonologist: Dr Brenda [name]; Next Scheduled Visit: 11/19/2023  Medication Access/Adherence  Current Pharmacy:  [facility] - [place], [place] - [address] [place] [address] Phone: [phone] Fax: [phone]  [facility] [address] [place] [address] Phone: [phone] Fax: [phone]   Patient reports affordability concerns with their medications: Yes  - Cost of Stiolto and Spiriva  too high; Dr Brenda has also suggested starting Jardiance but it's cost also was high.  Brand meds are $47 / month or $131 / 90 days but she has a $250 deductible and initial cost for branded med would be $297. Patient reports access/transportation concerns to their pharmacy: No  Patient reports adherence concerns with their medications:  No      Diabetes:  Current medications:  metformin  500mg  twice a day (she just recently increased from once daily -09/14/2023)  Current glucose readings: she only had 1 reading to report - blood glucose was 135 today at 11am (about 3 hours after breakfast)   Patient denies hypoglycemic s/sx including no dizziness, shakiness, sweating. Patient denies hyperglycemic symptoms including no polyuria, polydipsia, polyphagia, nocturia, neuropathy, blurred vision.  Current medication access support: none  COPD: She had her first visit with pulmonologist Dr Brenda [name]  09/18/2023  Current medications: has albuterol  inhaler. She was initially prescribed Stioloto on 09/18/2023. Looks like was later changed to Spiriva  Respimet.    Reports 1 exacerbations that require prednisone  and antibiotic course x 2 in the past year Current medication access support: none   Objective:  Lab Results  Component Value Date   HGBA1C 8.4 (H) 09/11/2023    Lab Results  Component Value Date   CREATININE 1.20 09/11/2023   BUN 29 (H) 09/11/2023   NA 137 09/11/2023   K 4.6 09/11/2023   CL 102 09/11/2023   CO2 27 09/11/2023    Lab Results  Component Value Date   CHOL 144 09/11/2023   HDL 52.50 09/11/2023   LDLCALC 64 09/11/2023   LDLDIRECT 91.0 05/29/2021   TRIG 138.0 09/11/2023   CHOLHDL 3 09/11/2023    Medications Reviewed Today     Reviewed by Brenda [name], RPH-CPP (Pharmacist) on 09/22/23 at 1448  Med List Status: <None>   Medication Order Taking? Sig Documenting Provider Last Dose Status Informant  acetaminophen  (TYLENOL ) 500 MG tablet [id number]  Take 1 tablet (500 mg total) by mouth every 6 (six) hours as needed (pain). Brenda [name]  Active   albuterol  (VENTOLIN  HFA) 108 (90 Base) MCG/ACT inhaler [id number] Yes Inhale 1-2 puffs into the lungs every 4 (four) hours as needed for wheezing or shortness of breath. Brenda [name]  Active   Alcohol  Swabs  70 % PADS [id number]  Use as directed once a day. Brenda [name]  Active   aspirin 81 MG tablet [id number] Yes Take 81 mg by mouth daily. Provider, Historical, [name]  Active Self  atorvastatin  (LIPITOR) 80 MG  tablet [id number] Yes Take 1 tablet (80 mg total) by mouth daily. Brenda [name]  Active   blood glucose meter kit and supplies [id number]  Dispense based on patient and insurance preference. Use Prn to check blood suagr Brenda [name]  Active   Blood Glucose Monitoring Suppl (TRUE METRIX AIR GLUCOSE METER) w/Device KIT [id number] Yes Use to check glucose once a day.  Dx code: E11.9  Brenda [name]  Active   Calcium  Citrate-Vitamin D (CALCIUM  CITRATE + D) 250-5 MG-MCG TABS [id number] Yes Take 2 tablets by mouth daily at 12 noon. Provider, Historical, [name]  Active   cholecalciferol (VITAMIN D3) 25 MCG (1000 UNIT) tablet [id number] Yes Take 1,000 Units by mouth daily. Provider, Historical, [name]  Active   Cyanocobalamin (VITAMIN B 12 PO) [id number] Yes Take 1,000 Units by mouth daily. Provider, Historical, [name]  Active Self  escitalopram  (LEXAPRO ) 20 MG tablet [id number] Yes Take 2 tablets (40 mg total) by mouth daily. Brenda [name]  Active   famotidine  (PEPCID ) 40 MG tablet [id number] Yes TAKE 1 TABLET EVERY DAY Brenda [name]  Active   glucose blood (TRUE METRIX BLOOD GLUCOSE TEST) test strip [id number] Yes Use to check glucose once a day.  Dx code: E11.9 Brenda [name]  Active   irbesartan  (AVAPRO ) 150 MG tablet [id number] Yes Take 1 tablet (150 mg total) by mouth daily. Brenda [name]  Active   KRILL OIL OMEGA-3 PO [id number] Yes Take by mouth daily. Provider, Historical, [name]  Active Self  metFORMIN  (GLUCOPHAGE ) 500 MG tablet [id number] Yes Take 1 tablet (500 mg total) by mouth 2 (two) times daily with a meal. Brenda [name]  Active   Multiple Vitamins-Minerals (ALIVE WOMENS 50+ PO) [id number] Yes Take by mouth daily. Provider, Historical, [name]  Active Self  polyethylene glycol powder (GLYCOLAX /MIRALAX ) 17 GM/SCOOP powder [id number] Yes Take 17 g by mouth daily. Drink 17g (1 scoop) dissolved in water  per day. Brenda [name]  Active   Probiotic Product (PROBIOTIC DAILY) CAPS [id number] Yes Take 1 capsule by mouth daily. Provider, Historical, [name]  Active Self  Tiotropium Bromide-Olodaterol (STIOLTO RESPIMAT ) 2.5-2.5 MCG/ACT AERS [id number]  Inhale 2 puffs into the lungs daily.  Patient not taking: Reported on 09/22/2023   Brenda [name]  Active   traMADol  (ULTRAM ) 50 MG tablet [id number] Yes Take 1 tablet by mouth every 6 (six) hours as needed for  moderate pain or severe pain. Provider, Historical, [name]  Active   triamterene -hydrochlorothiazide  (MAXZIDE-25) 37.5-25 MG tablet [id number] Yes Take 1 tablet by mouth daily. Brenda [name]  Active   TRUEplus Lancets 33G MISC [id number]  Use to check glucose once a day.  Dx code: E11.9 Brenda [name]  Active   TURMERIC PO [id number] Yes Take 1 capsule by mouth daily. Provider, Historical, [name]  Active Self              Assessment/Plan:   Diabetes: - Currently uncontrolled - Reviewed long term cardiovascular and renal outcomes of uncontrolled blood sugar - Reviewed goal A1c, goal fasting, and goal 2 hour post prandial glucose - Recommend to continue metformin  500mg  twice a day with food.  - Dr Brenda mentioned starting Jardiance but wanted to see if patient would be able to afford it.  I would recommend Jardiance 25mg  daily. She might qualify for Medicare Extra Help - and then cost would decrease to $12.15. Assisted with applying for LIS today. She also  meets financial criteria for Jardiance patient assistance program through [employer] Will collaborate with provider, CPhT, and patient to pursue assistance.   COPD: - Currently uncontrolled.  - Provided education about rescue / albuterol  inhaler versus maintenance / Stiolto or Spiriva  inhalers.  - Meets financial criteria for either Stiolto or Spiriva  patient assistance program through [facility]. Will collaborate with provider, CPhT, and patient to pursue assistance.    Follow Up Plan: 4 weeks.   Madelin Ray, PharmD Clinical Pharmacist [facility]

## 2023-09-23 ENCOUNTER — Other Ambulatory Visit (HOSPITAL_BASED_OUTPATIENT_CLINIC_OR_DEPARTMENT_OTHER): Payer: Self-pay

## 2023-09-23 ENCOUNTER — Telehealth: Payer: Self-pay | Admitting: *Deleted

## 2023-09-23 DIAGNOSIS — M5116 Intervertebral disc disorders with radiculopathy, lumbar region: Secondary | ICD-10-CM | POA: Diagnosis not present

## 2023-09-23 DIAGNOSIS — E1122 Type 2 diabetes mellitus with diabetic chronic kidney disease: Secondary | ICD-10-CM | POA: Diagnosis not present

## 2023-09-23 DIAGNOSIS — I129 Hypertensive chronic kidney disease with stage 1 through stage 4 chronic kidney disease, or unspecified chronic kidney disease: Secondary | ICD-10-CM | POA: Diagnosis not present

## 2023-09-23 DIAGNOSIS — M47816 Spondylosis without myelopathy or radiculopathy, lumbar region: Secondary | ICD-10-CM | POA: Diagnosis not present

## 2023-09-23 DIAGNOSIS — N183 Chronic kidney disease, stage 3 unspecified: Secondary | ICD-10-CM | POA: Diagnosis not present

## 2023-09-23 MED ORDER — STIOLTO RESPIMAT 2.5-2.5 MCG/ACT IN AERS
2.0000 | INHALATION_SPRAY | Freq: Every day | RESPIRATORY_TRACT | 3 refills | Status: DC
  Filled 2023-09-23: qty 4, 30d supply, fill #0

## 2023-09-23 NOTE — Telephone Encounter (Signed)
 Copied from CRM [id number]. Topic: Clinical - Prescription Issue >> Sep 22, 2023  2:56 PM [name] wrote: Reason for CRM: pt has applied for assistance for her Rx  Tiotropium Bromide-Olodaterol (STIOLTO RESPIMAT ) 2.5-2.5 MCG/ACT AER  Pt would like to know if you have any samples until she gets her assistance approved? Can you mail if you have any samples?      I called and spoke with the pt  Unfortunately, we do not have any samples of the Stiolto right now  She asked that we try sending rx to [facility] and she will see if it's more affordable there  Alternatively, she will ask PCP if they have samples or wait on her pt assistance to come through Nothing further needed at this time

## 2023-09-26 NOTE — Telephone Encounter (Signed)
 Received Provider portion of PAP application Jardiance (BI) will follow up with patient soon.

## 2023-09-29 ENCOUNTER — Other Ambulatory Visit: Payer: Self-pay | Admitting: Family Medicine

## 2023-10-02 DIAGNOSIS — E782 Mixed hyperlipidemia: Secondary | ICD-10-CM | POA: Insufficient documentation

## 2023-10-02 DIAGNOSIS — Z8669 Personal history of other diseases of the nervous system and sense organs: Secondary | ICD-10-CM | POA: Insufficient documentation

## 2023-10-02 DIAGNOSIS — M1990 Unspecified osteoarthritis, unspecified site: Secondary | ICD-10-CM | POA: Insufficient documentation

## 2023-10-02 DIAGNOSIS — Z86018 Personal history of other benign neoplasm: Secondary | ICD-10-CM | POA: Insufficient documentation

## 2023-10-02 DIAGNOSIS — I2510 Atherosclerotic heart disease of native coronary artery without angina pectoris: Secondary | ICD-10-CM | POA: Insufficient documentation

## 2023-10-02 DIAGNOSIS — M51369 Other intervertebral disc degeneration, lumbar region without mention of lumbar back pain or lower extremity pain: Secondary | ICD-10-CM | POA: Insufficient documentation

## 2023-10-02 DIAGNOSIS — N8110 Cystocele, unspecified: Secondary | ICD-10-CM | POA: Insufficient documentation

## 2023-10-02 DIAGNOSIS — N993 Prolapse of vaginal vault after hysterectomy: Secondary | ICD-10-CM | POA: Insufficient documentation

## 2023-10-03 ENCOUNTER — Other Ambulatory Visit (HOSPITAL_BASED_OUTPATIENT_CLINIC_OR_DEPARTMENT_OTHER): Payer: Self-pay

## 2023-10-03 NOTE — Telephone Encounter (Signed)
 PAP: Application for [place] has been submitted to [employer]), via fax

## 2023-10-07 ENCOUNTER — Encounter: Payer: Self-pay | Admitting: Cardiology

## 2023-10-07 ENCOUNTER — Ambulatory Visit: Attending: Cardiology | Admitting: Cardiology

## 2023-10-07 VITALS — BP 128/80 | HR 96 | Ht 67.0 in | Wt 213.0 lb

## 2023-10-07 DIAGNOSIS — E66811 Obesity, class 1: Secondary | ICD-10-CM | POA: Diagnosis not present

## 2023-10-07 DIAGNOSIS — I2510 Atherosclerotic heart disease of native coronary artery without angina pectoris: Secondary | ICD-10-CM

## 2023-10-07 DIAGNOSIS — I10 Essential (primary) hypertension: Secondary | ICD-10-CM | POA: Diagnosis not present

## 2023-10-07 DIAGNOSIS — E782 Mixed hyperlipidemia: Secondary | ICD-10-CM | POA: Diagnosis not present

## 2023-10-07 NOTE — Progress Notes (Signed)
 Cardiology Office Note:    Date:  10/07/2023   ID:  [name], DOB [birth year], MRN [id number]  PCP:  [name], MD  Cardiologist:  [name], MD   Referring MD: [name], MD    ASSESSMENT:    1. Coronary artery calcification seen on CAT scan   2. Primary hypertension   3. Hyperlipidemia, mixed   4. Obesity (BMI 30.0-34.9)    PLAN:    In order of problems listed above:  Coronary artery calcification: Secondary prevention stressed with the patient.  Importance of compliance with diet medication stressed and patient verbalized standing. She was advised to ambulate to the best of her ability. Essential hypertension: Blood pressure is stable and diet was emphasized. Mixed dyslipidemia: On lipid-lowering medications followed by primary care.  Lipids reviewed from [facility] sheet and discussed with the patient. Controlled diabetes mellitus and obesity: Hemoglobin A1c greater than 8: I discussed this with her.  Intervention and lifestyle education urged and she promises to do better.  Risks of uncontrolled diabetes and obesity visited and questions were answered to her satisfaction. She is managed also by pulmonary for COPD and dyspnea on exertion issues.  She uses inhalers with relief. Patient will be seen in follow-up appointment in 6 months or earlier if the patient has any concerns.    Medication Adjustments/Labs and Tests Ordered: Current medicines are reviewed at length with the patient today.  Concerns regarding medicines are outlined above.  No orders of the defined types were placed in this encounter.  No orders of the defined types were placed in this encounter.    No chief complaint on file.    History of Present Illness:    Brenda [name] is a 78 y.o. female.  Patient has past medical history of coronary artery calcification, essential hypertension, mixed dyslipidemia, uncontrolled diabetes mellitus and obesity.  She leads a sedentary lifestyle.  She  has history of smoking and chronic dyspnea on exertion.  At the time of my evaluation, the patient is alert awake oriented and in no distress.  Past Medical History:  Diagnosis Date   Acute cough 08/21/2023   Anxiety 10/17/2010   Arthritis 11/25/2011   Carpal tunnel syndrome on right 01/20/2012   Cervical cancer screening 04/26/2013   Menarche at 14 regular  Menopause surgical at 40  G1P1 s/p SVD  Partial hysterectomy for fibroids with bladder tack  Tubal  No h/o abnormal pap, last pap prior to hysterectomy  Mgm UTD, no concerns     Chronic obstructive pulmonary disease (HCC) 08/07/2023   Colon cancer screening 04/17/2011   Coronary artery calcification seen on CAT scan    cardiologist--- dr crape   DDD (degenerative disc disease), lumbar    Depression    DOE (dyspnea on exertion)    Dry skin 05/28/2023   Evaluate for OSA (obstructive sleep apnea) 02/14/2014   Feeling grief 09/30/2018   GERD (gastroesophageal reflux disease)    History of chicken pox 08/24/2012   childhood age 52     History of colon polyps 01/31/2021   History of obstructive sleep apnea    (04-29-2022  per pt was retested , no issue)  study in epic 05-01-2014 by dr burnard [facility] 16.9 / hr w/ nocturnal oxygen saturation   History of uterine fibroid    Hyperlipidemia 04/26/2012   Hyperlipidemia, mixed    2011   Hypertension    Left hip pain 03/19/2016   Left knee pain 01/05/2019  Lower back injury, initial encounter 04/02/2016   Lumbar disc disease with radiculopathy 01/20/2012   LVH (left ventricular hypertrophy) 01/30/2021   Medicare annual wellness visit, subsequent 12/25/2014   Muscle spasm 12/25/2014   Numerous moles 05/09/2015   OA (osteoarthritis)    Obesity 10/17/2010   Osteoarthritis of left glenohumeral joint 12/05/2015   Osteopenia 03/19/2016   Oxygen desaturation during sleep 05/24/2014   Pneumonia of right middle lobe due to infectious organism 08/26/2023   Preventative health care 03/24/2016    Prolapse of anterior vaginal wall    Prolapse of vaginal vault after hysterectomy    Pulmonary nodule 08/07/2023   Renal insufficiency 09/11/2023   Right foot pain 08/07/2023   Seasonal allergies    SOB (shortness of breath) 08/21/2023   SUI (stress urinary incontinence, female) 08/24/2012   Type 2 diabetes mellitus with obesity (HCC) 01/31/2013   IMO SNOMED Dx Update Oct 2024     Urinary incontinence 08/24/2012    Past Surgical History:  Procedure Laterality Date   ANTERIOR AND POSTERIOR REPAIR WITH SACROSPINOUS FIXATION N/A 05/30/2022   Procedure: ANTERIOR  REPAIR WITH SACROSPINOUS FIXATION;  Surgeon: Marilynne Rosaline SAILOR, MD;  Location: [facility];  Service: Gynecology;  Laterality: N/A;   BLADDER SUSPENSION N/A 05/30/2022   Procedure: TRANSVAGINAL TAPE (TVT) PROCEDURE;  Surgeon: Marilynne Rosaline SAILOR, MD;  Location: [facility];  Service: Gynecology;  Laterality: N/A;   COLONOSCOPY  2018   CYSTOSCOPY N/A 05/30/2022   Procedure: CYSTOSCOPY;  Surgeon: Marilynne Rosaline SAILOR, MD;  Location: [facility];  Service: Gynecology;  Laterality: N/A;   PERINEOPLASTY  05/30/2022   Procedure: PERINEOPLASTY;  Surgeon: Marilynne Rosaline SAILOR, MD;  Location: [facility];  Service: Gynecology;;   TOTAL VAGINAL HYSTERECTOMY  2004   @[facility] ;   W/   BILATERAL SALPINOOPHORECTOMY AND SLING PROCEDURE    Current Medications: Current Meds  Medication Sig   acetaminophen  (TYLENOL ) 500 MG tablet Take 1 tablet (500 mg total) by mouth every 6 (six) hours as needed (pain).   albuterol  (VENTOLIN  HFA) 108 (90 Base) MCG/ACT inhaler Inhale 1-2 puffs into the lungs every 4 (four) hours as needed for wheezing or shortness of breath.   Alcohol  Swabs  (DROPSAFE ALCOHOL  PREP) 70 % PADS USE AS DIRECTED ONCE A DAY.   aspirin 81 MG tablet Take 81 mg by mouth daily.   atorvastatin  (LIPITOR) 80 MG tablet Take 1 tablet (80 mg total) by mouth daily.   blood glucose  meter kit and supplies Dispense based on patient and insurance preference. Use Prn to check blood suagr   Blood Glucose Monitoring Suppl (TRUE METRIX AIR GLUCOSE METER) w/Device KIT Use to check glucose once a day.  Dx code: E11.9   Calcium  Citrate-Vitamin D (CALCIUM  CITRATE + D) 250-5 MG-MCG TABS Take 2 tablets by mouth daily at 12 noon.   cholecalciferol (VITAMIN D3) 25 MCG (1000 UNIT) tablet Take 1,000 Units by mouth daily.   Cyanocobalamin (VITAMIN B 12 PO) Take 1,000 Units by mouth daily.   escitalopram  (LEXAPRO ) 20 MG tablet Take 2 tablets (40 mg total) by mouth daily.   famotidine  (PEPCID ) 40 MG tablet TAKE 1 TABLET EVERY DAY   glucose blood (TRUE METRIX BLOOD GLUCOSE TEST) test strip Use to check glucose once a day.  Dx code: E11.9   irbesartan  (AVAPRO ) 150 MG tablet Take 1 tablet (150 mg total) by mouth daily.   KRILL OIL OMEGA-3 PO Take by mouth daily.   metFORMIN  (GLUCOPHAGE ) 500  MG tablet Take 1 tablet (500 mg total) by mouth 2 (two) times daily with a meal.   Multiple Vitamins-Minerals (ALIVE WOMENS 50+ PO) Take by mouth daily.   Probiotic Product (PROBIOTIC DAILY) CAPS Take 1 capsule by mouth daily.   traMADol  (ULTRAM ) 50 MG tablet Take 1 tablet by mouth every 6 (six) hours as needed for moderate pain or severe pain.   triamterene -hydrochlorothiazide  (MAXZIDE-25) 37.5-25 MG tablet Take 1 tablet by mouth daily.   TRUEplus Lancets 33G MISC Use to check glucose once a day.  Dx code: E11.9   TURMERIC PO Take 1 capsule by mouth daily.     Allergies:   Meloxicam    Social History   Socioeconomic History   Marital status: Divorced    Spouse name: Not on file   Number of children: Not on file   Years of education: Not on file   Highest education level: Not on file  Occupational History   Not on file  Tobacco Use   Smoking status: Former    Current packs/day: 0.00    Types: Cigarettes    Start date: 52    Quit date: 15    Years since quitting: 50.6   Smokeless tobacco:  Never  Vaping Use   Vaping status: Never Used  Substance and Sexual Activity   Alcohol  use: Not Currently    Comment: very rare   Drug use: Never   Sexual activity: Yes  Other Topics Concern   Not on file  Social History Narrative   Not on file   Social Drivers of Health   Financial Resource Strain: Medium Risk (08/30/2022)   Overall Financial Resource Strain (CARDIA)    Difficulty of Paying Living Expenses: Somewhat hard  Food Insecurity: No Food Insecurity (08/30/2022)   Hunger Vital Sign    Worried About Running Out of Food in the Last Year: Never true    Ran Out of Food in the Last Year: Never true  Transportation Needs: No Transportation Needs (08/30/2022)   PRAPARE - Administrator, Civil Service (Medical): No    Lack of Transportation (Non-Medical): No  Physical Activity: Inactive (08/30/2022)   Exercise Vital Sign    Days of Exercise per Week: 0 days    Minutes of Exercise per Session: 0 min  Stress: No Stress Concern Present (08/23/2020)   [employer] of Occupational Health - Occupational Stress Questionnaire    Feeling of Stress : Not at all  Social Connections: Moderately Isolated (08/23/2020)   Social Connection and Isolation Panel    Frequency of Communication with Friends and Family: More than three times a week    Frequency of Social Gatherings with Friends and Family: More than three times a week    Attends Religious Services: More than 4 times per year    Active Member of [employer] or Organizations: No    Attends Engineer, structural: Never    Marital Status: Divorced     Family History: The patient's family history includes Breast cancer (age of onset: 11) in her sister; Cancer in her paternal grandfather; Colon cancer (age of onset: 4) in her sister; Dementia in her mother and sister; Depression in her sister; Heart disease (age of onset: 94) in her father; Hypertension in her father; Obesity in her daughter; Thyroid  disease in her  sister.  ROS:   Please see the history of present illness.    All other systems reviewed and are negative.  EKGs/Labs/Other Studies Reviewed:  The following studies were reviewed today: .SABRA   I discussed my findings with the patient at length   Recent Labs: 03/18/2023: Magnesium 1.9 09/11/2023: ALT 11; BUN 29; Creatinine, Ser 1.20; Hemoglobin 12.9; Platelets 437.0; Potassium 4.6; Sodium 137; TSH 1.19  Recent Lipid Panel    Component Value Date/Time   CHOL 144 09/11/2023 1353   CHOL 186 02/02/2018 1035   TRIG 138.0 09/11/2023 1353   HDL 52.50 09/11/2023 1353   HDL 47 02/02/2018 1035   CHOLHDL 3 09/11/2023 1353   VLDL 27.6 09/11/2023 1353   LDLCALC 64 09/11/2023 1353   LDLCALC 121 (H) 02/02/2018 1035   LDLDIRECT 91.0 05/29/2021 1547    Physical Exam:    VS:  BP 128/80   Pulse 96   Ht 5' 7 (1.702 m)   Wt 213 lb (96.6 kg)   SpO2 93%   BMI 33.36 kg/m     Wt Readings from Last 3 Encounters:  10/07/23 213 lb (96.6 kg)  09/18/23 214 lb (97.1 kg)  09/11/23 211 lb 12.8 oz (96.1 kg)     GEN: Patient is in no acute distress HEENT: Normal NECK: No JVD; No carotid bruits LYMPHATICS: No lymphadenopathy CARDIAC: Hear sounds regular, 2/6 systolic murmur at the apex. RESPIRATORY:  Clear to auscultation without rales, wheezing or rhonchi  ABDOMEN: Soft, non-tender, non-distended MUSCULOSKELETAL:  No edema; No deformity  SKIN: Warm and dry NEUROLOGIC:  Alert and oriented x 3 PSYCHIATRIC:  Normal affect   Signed, [name], MD  10/07/2023 1:17 PM    [facility]

## 2023-10-07 NOTE — Addendum Note (Signed)
 Addended by: CARLA MILLING B on: 10/07/2023 04:23 PM   Modules accepted: Orders

## 2023-10-07 NOTE — Patient Instructions (Signed)

## 2023-10-13 NOTE — Telephone Encounter (Signed)
 Letter in media tab from [facility], patient has to apply for LIS with medicare and present a denial letter to [facility] before a review for approval.

## 2023-10-20 ENCOUNTER — Ambulatory Visit (INDEPENDENT_AMBULATORY_CARE_PROVIDER_SITE_OTHER): Admitting: Pharmacist

## 2023-10-20 DIAGNOSIS — J449 Chronic obstructive pulmonary disease, unspecified: Secondary | ICD-10-CM

## 2023-10-20 DIAGNOSIS — E669 Obesity, unspecified: Secondary | ICD-10-CM

## 2023-10-20 DIAGNOSIS — E1169 Type 2 diabetes mellitus with other specified complication: Secondary | ICD-10-CM

## 2023-10-20 DIAGNOSIS — N1832 Chronic kidney disease, stage 3b: Secondary | ICD-10-CM

## 2023-10-20 NOTE — Progress Notes (Signed)
 10/20/2023 Name: Brenda [name] MRN: [id number] DOB: 17-Apr-1945  Chief Complaint  Patient presents with   Medication Management   Diabetes    Brenda [name] is a 78 y.o. year old female who presented for a telephone visit.   They were referred to the pharmacist by their PCP for assistance in managing medication access.    Subjective:  Care Team: Primary Care Provider: [name], MD ; Next Scheduled Visit: 11/17/2023 Cardiologist: Dr Edwyna; Next Scheduled Visit: not currently scheduled  Pulmonologist: Dr Neda; Next Scheduled Visit: 11/19/2023  Medication Access/Adherence  Current Pharmacy:  [facility] - [place], [place] - [address] [place] [address] Phone: [phone] Fax: [phone]  [facility] [address] [place] [address] Phone: [phone] Fax: [phone]   Patient reports affordability concerns with their medications: Yes  - Cost of Stiolto and Spiriva  too high; Dr Domenica has also suggested starting Jardiance but it's cost also was high.  Brand meds are $47 / month or $131 / 90 days but she has a $250 deductible and initial cost for branded med would be $297. Patient reports access/transportation concerns to their pharmacy: No  Patient reports adherence concerns with their medications:  No      Diabetes:  Current medications:  metformin  500mg  twice a day (increased from once daily - 09/14/2023)  Jardiance 25mg  daily - has not started yet due to cost. Today patient reports she received letter form Social Security that she was ineligible for LIS / Extra Help but she is not sure where she put it. She has completed application with [employer] for jardiance patient assistance program but needs a copy of LIS denial letter sent to [facility].   Patient note to have low eGFR and elevated UACR at last check. There is no previous UACR to compare of  eGFR has been as low as 34.45 (05/28/2023)   Last UACR = 25.3 (09/11/2023)   Lab Results  Component Value Date   NA 137 09/11/2023   CL 102 09/11/2023   K 4.6 09/11/2023   CO2 27 09/11/2023   BUN 29 (H) 09/11/2023   CREATININE 1.20 09/11/2023   GFR 43.54 (L) 09/11/2023   CALCIUM  9.0 09/11/2023   PHOS 2.6 12/20/2013   ALBUMIN 3.8 09/11/2023   GLUCOSE 92 09/11/2023     Current glucose readings: 147 today. Reports blood glucose usually in 140 to 150's (before increasing metformin  blood glucose was in 160 to 170's)  Patient denies hypoglycemic s/sx including no dizziness, shakiness, sweating. Patient denies hyperglycemic symptoms including no polyuria, polydipsia, polyphagia, nocturia, neuropathy, blurred vision.  Current medication access support: none but [employer] application is pending.   COPD: She had her first visit with pulmonologist Dr Neda 09/18/2023  Current medications: has albuterol  inhaler. She was initially prescribed Stioloto on 09/18/2023. Looks like was later changed to Spiriva  Respimet. She has not started either due to cost.  We applied for LIS but she was denied. We also applied for [facility] Care program and status is pending. Program requires copy o fhe LIS denial letter.    Reports 1 exacerbations that require prednisone  and antibiotic course x 2 in the past year  Current medication access support: none but [employer] application is pending.    Objective:  Lab Results  Component Value Date   HGBA1C 8.4 (H) 09/11/2023    Lab Results  Component Value  Date   CREATININE 1.20 09/11/2023   BUN 29 (H) 09/11/2023   NA 137 09/11/2023   K 4.6 09/11/2023   CL 102 09/11/2023   CO2 27 09/11/2023    Lab Results  Component Value Date   CHOL 144 09/11/2023   HDL 52.50 09/11/2023   LDLCALC 64 09/11/2023   LDLDIRECT 91.0 05/29/2021   TRIG 138.0 09/11/2023   CHOLHDL 3 09/11/2023    Medications Reviewed Today     Reviewed by Carla Milling, RPH-CPP (Pharmacist)  on 10/20/23 at 1035  Med List Status: <None>   Medication Order Taking? Sig Documenting Provider Last Dose Status Informant  acetaminophen  (TYLENOL ) 500 MG tablet [id number] Yes Take 1 tablet (500 mg total) by mouth every 6 (six) hours as needed (pain). Zuleta, Kaitlin G, NP  Active   albuterol  (VENTOLIN  HFA) 108 (90 Base) MCG/ACT inhaler [id number] Yes Inhale 1-2 puffs into the lungs every 4 (four) hours as needed for wheezing or shortness of breath. Yacopino, Jessica L, NP  Active   Alcohol  Swabs  (DROPSAFE ALCOHOL  PREP) 70 % PADS [id number] Yes USE AS DIRECTED ONCE A DAY. [name], MD  Active   aspirin 81 MG tablet [id number] Yes Take 81 mg by mouth daily. [provider]  Active Self  atorvastatin  (LIPITOR) 80 MG tablet [id number] Yes Take 1 tablet (80 mg total) by mouth daily. [name], MD  Active   blood glucose meter kit and supplies [id number] Yes Dispense based on patient and insurance preference. Use Prn to check blood suagr [name], MD  Active   Blood Glucose Monitoring Suppl (TRUE METRIX AIR GLUCOSE METER) w/Device KIT [id number] Yes Use to check glucose once a day.  Dx code: E11.9 [name], MD  Active   Calcium  Citrate-Vitamin D (CALCIUM  CITRATE + D) 250-5 MG-MCG TABS [id number] Yes Take 2 tablets by mouth daily at 12 noon. [provider]  Active   cholecalciferol (VITAMIN D3) 25 MCG (1000 UNIT) tablet [id number] Yes Take 1,000 Units by mouth daily. [provider]  Active   Cyanocobalamin (VITAMIN B 12 PO) [id number] Yes Take 1,000 Units by mouth daily. [provider]  Active Self  empagliflozin (JARDIANCE) 25 MG TABS tablet [id number]  Take 1 tablet (25 mg total) by mouth daily before breakfast.  Patient not taking: Reported on 10/20/2023   [name], MD  Active   escitalopram  (LEXAPRO ) 20 MG tablet [id number] Yes Take 2 tablets (40 mg total) by mouth daily. Cyndi Shaver, PA-C  Active   famotidine  (PEPCID ) 40 MG tablet  [id number] Yes TAKE 1 TABLET EVERY DAY [name], MD  Active   glucose blood (TRUE METRIX BLOOD GLUCOSE TEST) test strip [id number] Yes Use to check glucose once a day.  Dx code: E11.9 [name], MD  Active   irbesartan  (AVAPRO ) 150 MG tablet [id number] Yes Take 1 tablet (150 mg total) by mouth daily. [name], MD  Active   KRILL OIL OMEGA-3 PO [id number] Yes Take by mouth daily. [provider]  Active Self  metFORMIN  (GLUCOPHAGE ) 500 MG tablet [id number] Yes Take 1 tablet (500 mg total) by mouth 2 (two) times daily with a meal. Wheeler Harlene CROME, NP  Active   Multiple Vitamins-Minerals (ALIVE WOMENS 50+ PO) [id number] Yes Take by mouth daily. [provider]  Active Self  polyethylene glycol powder (GLYCOLAX /MIRALAX ) 17 GM/SCOOP powder [id number]  Take 17 g by mouth daily. Drink 17g (1 scoop) dissolved in water  per day.  Patient not taking: Reported on 10/07/2023   Zuleta, Kaitlin G, NP  Active   Probiotic Product (PROBIOTIC DAILY) CAPS [id number] Yes Take 1 capsule by mouth daily. [provider]  Active Self  Tiotropium Bromide-Olodaterol (STIOLTO RESPIMAT ) 2.5-2.5 MCG/ACT AERS [id number]  Inhale 2 puffs into the lungs daily.  Patient not taking: Reported on 10/20/2023   Neda Jennet LABOR, MD  Active   traMADol  (ULTRAM ) 50 MG tablet [id number] Yes Take 1 tablet by mouth every 6 (six) hours as needed for moderate pain or severe pain. [provider]  Active   triamterene -hydrochlorothiazide  (MAXZIDE-25) 37.5-25 MG tablet [id number] Yes Take 1 tablet by mouth daily. [name], MD  Active   TRUEplus Lancets 33G MISC [id number] Yes Use to check glucose once a day.  Dx code: E11.9 [name], MD  Active   TURMERIC PO [id number] Yes Take 1 capsule by mouth daily. [provider]  Active Self              Assessment/Plan:   Diabetes: Currently uncontrolled per last A1c but home blood glucose improving. Also noted to have elevated  UACR and low eGFR (G3b / A1 - based on one result 09/11/2023) - Reviewed long term cardiovascular and renal outcomes of uncontrolled blood sugar - Reviewed goal A1c, goal fasting, and goal 2 hour post prandial glucose - Recommend to continue metformin  500mg  twice a day with food.  - Plan to start Jardiance 25mg  daily which would benefit blood glucose and provide cardiorenal protection. Patient to looks for LIS denial letter and bring to office. She will request another copy from Social Security if she is unable to locate letter.  - patient assistance program application for Jardiance and Stiolto are pending - program requiring copy of LIS denial letter.  - Recheck UACR / microalbumin in 3 to 6 months  COPD: - Currently uncontrolled.  - Provided education about rescue / albuterol  inhaler versus maintenance / Stiolto or Spiriva  inhalers.  - Meets financial criteria for either Stiolto or Spiriva  patient assistance program through [facility]. Will collaborate with provider, CPhT, and patient to pursue assistance. See above regarding LIS letter and [facility] Care application.   Follow Up Plan: 2 to 4 weeks.   Madelin Ray, PharmD Clinical Pharmacist [facility]

## 2023-10-20 NOTE — Telephone Encounter (Signed)
 Patient received letter of denial regarding LIS. She has misplaced it but will look for it and bring it to the office or upload a  copy in mychart.  If she is not able to find I recommended she contact Social Security office to request another copy.

## 2023-10-24 ENCOUNTER — Telehealth: Payer: Self-pay

## 2023-10-24 NOTE — Telephone Encounter (Signed)
 Patient was identified as falling into the [place] Measure - Diabetes.   Patient was: Appointment already scheduled for:  f/u with PCP on 11/17/23. F/U with pharmacist is 11/05/2023. Brenda [name]

## 2023-10-25 ENCOUNTER — Other Ambulatory Visit: Payer: Self-pay | Admitting: Family Medicine

## 2023-11-05 ENCOUNTER — Other Ambulatory Visit: Admitting: Pharmacist

## 2023-11-05 NOTE — Telephone Encounter (Signed)
 PAP: Application for Bernadine has been submitted to [employer]), via fax This time with LIS letter and other supporting documents.

## 2023-11-05 NOTE — Progress Notes (Signed)
 11/05/2023 Name: Brenda [name] MRN: [id number] DOB: Jun 16, 1945  Chief Complaint  Patient presents with   Medication Adherence   Diabetes    Brenda [name] is a 78 y.o. year old female who presented for a telephone visit.   They were referred to the pharmacist by their PCP for assistance in managing medication access.    Subjective:  Care Team: Primary Care Provider: [name], MD ; Next Scheduled Visit: 11/17/2023 Cardiologist: Dr Edwyna; Next Scheduled Visit: not currently scheduled  Pulmonologist: Dr Neda; Next Scheduled Visit: 11/19/2023  Medication Access/Adherence  Current Pharmacy:  [facility] - [place], [place] - [address] [place] [address] Phone: [phone] Fax: [phone]  [facility] [address], [address] [place] [address] Phone: [phone] Fax: [phone]   Patient reports affordability concerns with their medications: Yes  - Cost of Stiolto and Spiriva  too high; Dr Domenica has also suggested starting Jardiance but it's cost also was high. Patient provided LIS denial letter to office last week - received by myself today.   Brand meds are $47 / month or $131 / 90 days but she has a $250 deductible and initial cost for branded med would be $297.  Patient reports access/transportation concerns to their pharmacy: No  Patient reports adherence concerns with their medications:  No      Diabetes:  Current medications:  metformin  500mg  twice a day (increased from once daily - 09/14/2023)  Jardiance 25mg  daily - has not started yet due to cost.  Patient received letter from [employer] that she was ineligible for LIS / Extra Help. I received letter today. She has completed application with [employer] for jardiance patient assistance program but [employer] needs a copy of LIS denial letter.   Patient noted to have low eGFR and elevated UACR at  last check. There is no previous UACR to compare to.  eGFR has been as low as 34.45 (05/28/2023)   Last UACR = 25.3 (09/11/2023)   Lab Results  Component Value Date   NA 137 09/11/2023   CL 102 09/11/2023   K 4.6 09/11/2023   CO2 27 09/11/2023   BUN 29 (H) 09/11/2023   CREATININE 1.20 09/11/2023   GFR 43.54 (L) 09/11/2023   CALCIUM  9.0 09/11/2023   PHOS 2.6 12/20/2013   ALBUMIN 3.8 09/11/2023   GLUCOSE 92 09/11/2023     Current glucose readings: Reports blood glucose usually in 140 to 150's (before increasing metformin  blood glucose was in 160 to 170's)  Patient denies hypoglycemic s/sx including no dizziness, shakiness, sweating. Patient denies hyperglycemic symptoms including no polyuria, polydipsia, polyphagia, nocturia, neuropathy, blurred vision.  Current medication access support: none but [employer] application is pending.   COPD: She had her first visit with pulmonologist Dr Neda 09/18/2023  Current medications: has albuterol  inhaler. She was initially prescribed Stioloto on 09/18/2023. Looks like was later changed to Spiriva  Respimet. She has not started either due to cost.  We applied for LIS but she was denied. We also applied for [facility] Care program and status is pending. Program requires copy o fhe LIS denial letter.   Reports 1 exacerbations that require prednisone  and antibiotic course x 2 in the past year  Current medication access support: none but [employer] application is pending.    Objective:  Lab Results  Component Value Date   HGBA1C 8.4 (H) 09/11/2023  Lab Results  Component Value Date   CREATININE 1.20 09/11/2023   BUN 29 (H) 09/11/2023   NA 137 09/11/2023   K 4.6 09/11/2023   CL 102 09/11/2023   CO2 27 09/11/2023    Lab Results  Component Value Date   CHOL 144 09/11/2023   HDL 52.50 09/11/2023   LDLCALC 64 09/11/2023   LDLDIRECT 91.0 05/29/2021   TRIG 138.0 09/11/2023   CHOLHDL 3 09/11/2023    Medications Reviewed Today    Medications were not reviewed in this encounter       Assessment/Plan:   Diabetes: Currently uncontrolled per last A1c but home blood glucose improving. Also noted to have elevated UACR and low eGFR (G3b / A1 - based on one result 09/11/2023) - Recommend to continue metformin  500mg  twice a day with food.  - Plan to start Jardiance 25mg  daily which would benefit blood glucose and provide cardiorenal protection. Patient assistance program application for Jardiance and Stiolto are pending. Sent LIS denial letter to [facility] today by fax. - Recheck UACR / microalbumin in 3 to 6 months  COPD: - Currently uncontrolled.  - Provided education about rescue / albuterol  inhaler versus maintenance / Stiolto or Spiriva  inhalers.  - Meets financial criteria for either Stiolto or Spiriva  patient assistance program through [facility]. Will collaborate with provider, CPhT, and patient to pursue assistance. See above regarding LIS letter and [facility] Care application.   Follow Up Plan: 2 to 4 weeks.   Madelin Ray, PharmD Clinical Pharmacist [facility]

## 2023-11-06 ENCOUNTER — Telehealth: Payer: Self-pay | Admitting: *Deleted

## 2023-11-06 ENCOUNTER — Ambulatory Visit: Admitting: *Deleted

## 2023-11-06 VITALS — Ht 67.0 in | Wt 211.0 lb

## 2023-11-06 DIAGNOSIS — M8580 Other specified disorders of bone density and structure, unspecified site: Secondary | ICD-10-CM

## 2023-11-06 DIAGNOSIS — Z0000 Encounter for general adult medical examination without abnormal findings: Secondary | ICD-10-CM | POA: Diagnosis not present

## 2023-11-06 NOTE — Progress Notes (Signed)
 Please attest this visit in the absence of patient primary care provider.    Subjective:   Brenda [name] is a 78 y.o. who presents for a Medicare Wellness preventive visit.  As a reminder, Annual Wellness Visits don't include a physical exam, and some assessments may be limited, especially if this visit is performed virtually. We may recommend an in-person follow-up visit with your provider if needed.  Visit Complete: Virtual I connected with  Brenda [name] on 11/06/23 by a audio enabled telemedicine application and verified that I am speaking with the correct person using two identifiers.  Patient Location: Home  Provider Location: Office/Clinic  I discussed the limitations of evaluation and management by telemedicine. The patient expressed understanding and agreed to proceed.  Vital Signs: Because this visit was a virtual/telehealth visit, some criteria may be missing or patient reported. Any vitals not documented were not able to be obtained and vitals that have been documented are patient reported.  VideoDeclined- This patient declined Librarian, academic. Therefore the visit was completed with audio only.  Persons Participating in Visit: Patient.  AWV Questionnaire: No: Patient Medicare AWV questionnaire was not completed prior to this visit.  Cardiac Risk Factors include: advanced age (>18men, >65 women);diabetes mellitus;dyslipidemia;hypertension;Other (see comment), Risk factor comments: LVH, obesity, COPD     Objective:    Today's Vitals   11/06/23 1301  Weight: 211 lb (95.7 kg)  Height: 5' 7 (1.702 m)   Body mass index is 33.05 kg/m.     11/06/2023    3:10 PM 03/18/2023    4:45 PM 08/30/2022    1:37 PM 05/30/2022    9:55 AM 08/28/2021    1:48 PM 08/23/2020    2:29 PM 05/24/2019    1:16 PM  Advanced Directives  Does Patient Have a Medical Advance Directive? No No No No No No No  Would patient like information on creating a medical advance  directive? Yes (MAU/Ambulatory/Procedural Areas - Information given) No - Patient declined No - Patient declined No - Patient declined No - Patient declined Yes (MAU/Ambulatory/Procedural Areas - Information given) No - Patient declined    Current Medications (verified) Outpatient Encounter Medications as of 11/06/2023  Medication Sig   acetaminophen  (TYLENOL ) 500 MG tablet Take 1 tablet (500 mg total) by mouth every 6 (six) hours as needed (pain).   albuterol  (VENTOLIN  HFA) 108 (90 Base) MCG/ACT inhaler Inhale 1-2 puffs into the lungs every 4 (four) hours as needed for wheezing or shortness of breath.   Alcohol  Swabs  (DROPSAFE ALCOHOL  PREP) 70 % PADS USE AS DIRECTED ONCE A DAY.   aspirin 81 MG tablet Take 81 mg by mouth daily.   atorvastatin  (LIPITOR) 80 MG tablet Take 1 tablet (80 mg total) by mouth daily.   blood glucose meter kit and supplies Dispense based on patient and insurance preference. Use Prn to check blood suagr   Blood Glucose Monitoring Suppl (TRUE METRIX AIR GLUCOSE METER) w/Device KIT Use to check glucose once a day.  Dx code: E11.9   Calcium  Citrate-Vitamin D (CALCIUM  CITRATE + D) 250-5 MG-MCG TABS Take 2 tablets by mouth daily at 12 noon.   cholecalciferol (VITAMIN D3) 25 MCG (1000 UNIT) tablet Take 1,000 Units by mouth daily.   Cyanocobalamin (VITAMIN B 12 PO) Take 1,000 Units by mouth daily.   escitalopram  (LEXAPRO ) 20 MG tablet Take 2 tablets (40 mg total) by mouth daily.   famotidine  (PEPCID ) 40 MG tablet TAKE 1 TABLET EVERY DAY  glucose blood (TRUE METRIX BLOOD GLUCOSE TEST) test strip Use to check glucose once a day.  Dx code: E11.9   irbesartan  (AVAPRO ) 150 MG tablet Take 1 tablet (150 mg total) by mouth daily.   KRILL OIL OMEGA-3 PO Take by mouth daily.   metFORMIN  (GLUCOPHAGE ) 500 MG tablet Take 1 tablet (500 mg total) by mouth 2 (two) times daily with a meal.   Multiple Vitamins-Minerals (ALIVE WOMENS 50+ PO) Take by mouth daily.   OVER THE COUNTER MEDICATION Take  1 each by mouth daily. FAIR LIFE protein shake   Probiotic Product (PROBIOTIC DAILY) CAPS Take 1 capsule by mouth daily.   traMADol  (ULTRAM ) 50 MG tablet Take 1 tablet by mouth every 6 (six) hours as needed for moderate pain or severe pain.   triamterene -hydrochlorothiazide  (MAXZIDE-25) 37.5-25 MG tablet Take 1 tablet by mouth daily.   TRUEplus Lancets 33G MISC Use to check glucose once a day.  Dx code: E11.9   TURMERIC PO Take 1 capsule by mouth daily.   empagliflozin (JARDIANCE) 25 MG TABS tablet Take 1 tablet (25 mg total) by mouth daily before breakfast. (Patient not taking: Reported on 11/06/2023)   Tiotropium Bromide-Olodaterol (STIOLTO RESPIMAT ) 2.5-2.5 MCG/ACT AERS Inhale 2 puffs into the lungs daily. (Patient not taking: Reported on 11/06/2023)   [DISCONTINUED] polyethylene glycol powder (GLYCOLAX /MIRALAX ) 17 GM/SCOOP powder Take 17 g by mouth daily. Drink 17g (1 scoop) dissolved in water  per day. (Patient not taking: Reported on 10/07/2023)   No facility-administered encounter medications on file as of 11/06/2023.    Allergies (verified) Meloxicam    History: Past Medical History:  Diagnosis Date   Acute cough 08/21/2023   Anxiety 10/17/2010   Arthritis 11/25/2011   Carpal tunnel syndrome on right 01/20/2012   Cervical cancer screening 04/26/2013   Menarche at 14 regular  Menopause surgical at 53  G1P1 s/p SVD  Partial hysterectomy for fibroids with bladder tack  Tubal  No h/o abnormal pap, last pap prior to hysterectomy  Mgm UTD, no concerns     Chronic obstructive pulmonary disease (HCC) 08/07/2023   Colon cancer screening 04/17/2011   Coronary artery calcification seen on CAT scan    cardiologist--- dr edwyna   DDD (degenerative disc disease), lumbar    Depression    DOE (dyspnea on exertion)    Dry skin 05/28/2023   Evaluate for OSA (obstructive sleep apnea) 02/14/2014   Feeling grief 09/30/2018   GERD (gastroesophageal reflux disease)    History of chicken pox 08/24/2012    childhood age 87     History of colon polyps 01/31/2021   History of obstructive sleep apnea    (04-29-2022  per pt was retested , no issue)  study in epic 05-01-2014 by dr burnard [facility] 16.9 / hr w/ nocturnal oxygen saturation   History of uterine fibroid    Hyperlipidemia 04/26/2012   Hyperlipidemia, mixed    2011   Hypertension    Left hip pain 03/19/2016   Left knee pain 01/05/2019   Lower back injury, initial encounter 04/02/2016   Lumbar disc disease with radiculopathy 01/20/2012   LVH (left ventricular hypertrophy) 01/30/2021   Medicare annual wellness visit, subsequent 12/25/2014   Muscle spasm 12/25/2014   Numerous moles 05/09/2015   OA (osteoarthritis)    Obesity 10/17/2010   Osteoarthritis of left glenohumeral joint 12/05/2015   Osteopenia 03/19/2016   Oxygen desaturation during sleep 05/24/2014   Pneumonia of right middle lobe due to infectious organism 08/26/2023   Preventative health care 03/24/2016  Prolapse of anterior vaginal wall    Prolapse of vaginal vault after hysterectomy    Pulmonary nodule 08/07/2023   Renal insufficiency 09/11/2023   Right foot pain 08/07/2023   Seasonal allergies    SOB (shortness of breath) 08/21/2023   SUI (stress urinary incontinence, female) 08/24/2012   Type 2 diabetes mellitus with obesity (HCC) 01/31/2013   IMO SNOMED Dx Update Oct 2024     Urinary incontinence 08/24/2012   Past Surgical History:  Procedure Laterality Date   ANTERIOR AND POSTERIOR REPAIR WITH SACROSPINOUS FIXATION N/A 05/30/2022   Procedure: ANTERIOR  REPAIR WITH SACROSPINOUS FIXATION;  Surgeon: Marilynne Rosaline SAILOR, MD;  Location: [facility];  Service: Gynecology;  Laterality: N/A;   BLADDER SUSPENSION N/A 05/30/2022   Procedure: TRANSVAGINAL TAPE (TVT) PROCEDURE;  Surgeon: Marilynne Rosaline SAILOR, MD;  Location[facility];  Service: Gynecology;  Laterality: N/A;   COLONOSCOPY  2018   CYSTOSCOPY N/A 05/30/2022   Procedure:  CYSTOSCOPY;  Surgeon: Marilynne Rosaline SAILOR, MD;  Location[facility];  Service: Gynecology;  Laterality: N/A;   PERINEOPLASTY  05/30/2022   Procedure: PERINEOPLASTY;  Surgeon: Marilynne Rosaline SAILOR, MD;  Location: [facility];  Service: Gynecology;;   TOTAL VAGINAL HYSTERECTOMY  2004   @[facility] ;   W/   BILATERAL SALPINOOPHORECTOMY AND SLING PROCEDURE   Family History  Problem Relation Age of Onset   Dementia Mother    Heart disease Father 72   Hypertension Father    Colon cancer Sister 53   Dementia Sister    Breast cancer Sister 29   Thyroid  disease Sister    Depression Sister        anxiety   Obesity Daughter    Cancer Paternal Grandfather        bone   Social History   Socioeconomic History   Marital status: Divorced    Spouse name: Not on file   Number of children: Not on file   Years of education: Not on file   Highest education level: Not on file  Occupational History   Not on file  Tobacco Use   Smoking status: Former    Current packs/day: 0.00    Types: Cigarettes    Start date: 69    Quit date: 1975    Years since quitting: 50.7   Smokeless tobacco: Never  Vaping Use   Vaping status: Never Used  Substance and Sexual Activity   Alcohol  use: Not Currently    Comment: very rare   Drug use: Never   Sexual activity: Yes  Other Topics Concern   Not on file  Social History Narrative   Not on file   Social Drivers of Health   Financial Resource Strain: Medium Risk (11/06/2023)   Overall Financial Resource Strain (CARDIA)    Difficulty of Paying Living Expenses: Somewhat hard  Food Insecurity: No Food Insecurity (11/06/2023)   Hunger Vital Sign    Worried About Running Out of Food in the Last Year: Never true    Ran Out of Food in the Last Year: Never true  Transportation Needs: No Transportation Needs (11/06/2023)   PRAPARE - Administrator, Civil Service (Medical): No    Lack of Transportation (Non-Medical): No   Physical Activity: Insufficiently Active (11/06/2023)   Exercise Vital Sign    Days of Exercise per Week: 1 day    Minutes of Exercise per Session: 60 min  Stress: No Stress Concern Present (11/06/2023)  [employer] of Occupational Health - Occupational Stress Questionnaire    Feeling of Stress: Only a little  Social Connections: Moderately Integrated (11/06/2023)   Social Connection and Isolation Panel    Frequency of Communication with Friends and Family: Three times a week    Frequency of Social Gatherings with Friends and Family: Once a week    Attends Religious Services: 1 to 4 times per year    Active Member of [employer] or Organizations: No    Attends Engineer, structural: More than 4 times per year    Marital Status: Widowed    Tobacco Counseling Counseling given: Not Answered    Clinical Intake:  Pre-visit preparation completed: Yes  Pain : No/denies pain     BMI - recorded: 33.05 Nutritional Status: BMI > 30  Obese Nutritional Risks: None Diabetes: Yes CBG done?: No Did pt. bring in CBG monitor from home?: No  Lab Results  Component Value Date   HGBA1C 8.4 (H) 09/11/2023   HGBA1C 7.0 (H) 05/28/2023   HGBA1C 6.8 (H) 02/10/2023     How often do you need to have someone help you when you read instructions, pamphlets, or other written materials from your doctor or pharmacy?: 1 - Never  Interpreter Needed?: No  Information entered by :: Lolita Libra, CMA   Activities of Daily Living     11/06/2023    1:20 PM  In your present state of health, do you have any difficulty performing the following activities:  Hearing? 1  Vision? 0  Difficulty concentrating or making decisions? 0  Walking or climbing stairs? 0  Dressing or bathing? 0  Doing errands, shopping? 0  Preparing Food and eating ? N  Using the Toilet? N  In the past six months, have you accidently leaked urine? N  Do you have problems with loss of bowel control? N  Managing your  Medications? N  Managing your Finances? N  Housekeeping or managing your Housekeeping? N    Patient Care Team: [name], MD as PCP - General (Family Medicine) Revankar, Jennifer SAUNDERS, MD as PCP - Cardiology (Cardiology) Revankar, Jennifer SAUNDERS, MD as Consulting Physician (Cardiology) Begovich, Norleen Harry, DO (Sports Medicine) Renate Lynwood Hussar, MD as Referring Physician  I have updated your Care Teams any recent Medical Services you may have received from other providers in the past year.     Assessment:   This is a routine wellness examination for Brenda [name].  Hearing/Vision screen Hearing Screening - Comments:: Has decreased hearing in groups. Will set up appt for testing Vision Screening - Comments:: Has upcoming eye appt with Dr Renate.   Goals Addressed             This Visit's Progress    Increase physical activity   Not on track      Depression Screen     11/06/2023    1:25 PM 08/27/2023    1:53 PM 02/10/2023    2:02 PM 08/30/2022    1:54 PM 02/07/2022    3:40 PM 08/28/2021    1:50 PM 05/29/2021    2:56 PM  PHQ 2/9 Scores  PHQ - 2 Score 0 0 1 0 1 0 0  PHQ- 9 Score 0  2    0    Fall Risk     11/06/2023    1:12 PM 08/27/2023    1:53 PM 02/10/2023    1:45 PM 08/30/2022    1:44 PM 04/08/2022    2:54 PM  Fall Risk   Falls in the past year? 0 0 0 0 0  Number falls in past yr: 0 0 0 0 0  Injury with Fall? 0  0 0 0  Risk for fall due to : No Fall Risks  No Fall Risks No Fall Risks   Follow up Education provided Falls evaluation completed Falls evaluation completed Falls evaluation completed Falls evaluation completed    MEDICARE RISK AT HOME:  Medicare Risk at Home Any stairs in or around the home?: Yes If so, are there any without handrails?: No Home free of loose throw rugs in walkways, pet beds, electrical cords, etc?: Yes Adequate lighting in your home to reduce risk of falls?: Yes Life alert?: No Use of a cane, walker or w/c?: No Grab bars in the bathroom?:  Yes Shower chair or bench in shower?: Yes Elevated toilet seat or a handicapped toilet?: No  TIMED UP AND GO:  Was the test performed?  No  Cognitive Function: 6CIT completed    03/29/2016    1:24 PM  MMSE - Mini Mental State Exam  Orientation to time 5   Orientation to Place 5   Registration 3   Attention/ Calculation 5   Recall 3   Language- name 2 objects 2   Language- repeat 1  Language- follow 3 step command 3   Language- read & follow direction 1   Write a sentence 1   Copy design 1   Total score 30      Data saved with a previous flowsheet row definition        11/06/2023    1:33 PM 08/30/2022    1:58 PM 08/28/2021    2:04 PM  6CIT Screen  What Year? 0 points 0 points 0 points  What month? 0 points 0 points 0 points  What time? 0 points 0 points 0 points  Count back from 20 0 points 0 points 0 points  Months in reverse 2 points 4 points 2 points  Repeat phrase 6 points 4 points 10 points  Total Score 8 points 8 points 12 points    Immunizations Immunization History  Administered Date(s) Administered   Fluad Quad(high Dose 65+) 10/26/2018, 11/29/2021, 12/19/2021, 10/31/2022   Fluzone Influenza virus vaccine,trivalent (IIV3), split virus 02/04/2004   INFLUENZA, HIGH DOSE SEASONAL PF 11/15/2016, 10/26/2018   Influenza Split 01/01/2010, 01/16/2011, 11/25/2011, 01/03/2014   Influenza Whole 12/31/2012   Influenza,inj,Quad PF,6+ Mos 12/20/2013, 12/19/2014   Influenza-Unspecified 11/09/2015, 11/25/2017, 11/17/2019, 01/02/2021   Moderna Covid-19 Fall Seasonal Vaccine 87yrs & older 11/28/2021   PFIZER(Purple Top)SARS-COV-2 Vaccination 04/08/2019, 04/29/2019, 12/12/2019, 07/07/2020   PNEUMOCOCCAL CONJUGATE-20 06/02/2021   Pfizer Covid-19 Vaccine Bivalent Booster 71yrs & up 01/18/2021, 12/19/2021   Pfizer(Comirnaty)Fall Seasonal Vaccine 12 years and older 10/31/2022   Pneumococcal Conjugate-13 01/25/2013   Pneumococcal Polysaccharide-23 12/21/2007, 12/19/2014    Respiratory Syncytial Virus Vaccine,Recomb Aduvanted(Arexvy) 04/14/2022   Tdap 04/17/2011   Zoster Recombinant(Shingrix) 08/16/2021, 11/21/2021    Screening Tests Health Maintenance  Topic Date Due   INFLUENZA VACCINE  10/03/2023   COVID-19 Vaccine (8 - Pfizer risk 2024-25 season) 11/03/2023   OPHTHALMOLOGY EXAM  11/05/2023   Medicare Annual Wellness (AWV)  11/06/2023 (Originally 08/30/2023)   DTaP/Tdap/Td (2 - Td or Tdap) 02/26/2024 (Originally 04/16/2021)   HEMOGLOBIN A1C  03/13/2024   FOOT EXAM  05/27/2024   Diabetic kidney evaluation - eGFR measurement  09/10/2024   Diabetic kidney evaluation - Urine ACR  09/10/2024   Pneumococcal Vaccine: 50+ Years  Completed  DEXA SCAN  Completed   Hepatitis C Screening  Completed   Zoster Vaccines- Shingrix  Completed   HPV VACCINES  Aged Out   Meningococcal B Vaccine  Aged Out   Colonoscopy  Discontinued    Health Maintenance  Health Maintenance Due  Topic Date Due   INFLUENZA VACCINE  10/03/2023   COVID-19 Vaccine (8 - Pfizer risk 2024-25 season) 11/03/2023   OPHTHALMOLOGY EXAM  11/05/2023   Health Maintenance Items Addressed: Has upcoming eye exam in September. Will get flu vaccine at upcoming OV. COVID vaccine at pharmacy.  DEXA ordered today  Additional Screening:  Vision Screening: Recommended annual ophthalmology exams for early detection of glaucoma and other disorders of the eye. Would you like a referral to an eye doctor? No    Dental Screening: Recommended annual dental exams for proper oral hygiene  Community Resource Referral / Chronic Care Management: CRR required this visit?  Yes, resources provided thru [place].  CCM required this visit?  No   Plan:    I have personally reviewed and noted the following in the patient's chart:   Medical and social history Use of alcohol , tobacco or illicit drugs  Current medications and supplements including opioid prescriptions. Patient is not currently taking opioid  prescriptions. Functional ability and status Nutritional status Physical activity Advanced directives List of other physicians Hospitalizations, surgeries, and ER visits in previous 12 months Vitals Screenings to include cognitive, depression, and falls Referrals and appointments  In addition, I have reviewed and discussed with patient certain preventive protocols, quality metrics, and best practice recommendations. A written personalized care plan for preventive services as well as general preventive health recommendations were provided to patient.   Lolita Libra, CMA   11/06/2023   After Visit Summary: (MyChart) Due to this being a telephonic visit, the after visit summary with patients personalized plan was offered to patient via MyChart   Notes: See phone note

## 2023-11-06 NOTE — Patient Instructions (Addendum)
 Brenda [name] , Thank you for taking time out of your busy schedule to complete your Annual Wellness Visit with me. I enjoyed our conversation and look forward to speaking with you again next year. I, as well as your care team,  appreciate your ongoing commitment to your health goals. Please review the following plan we discussed and let me know if I can assist you in the future. Your Game plan/ To Do List    Referrals: If you haven't heard from the office you've been referred to, please reach out to them at the phone provided.   Your Pulmonology and Nephrology referrals are being sent to [facility] locations.   Therapist, occupational (apply online for gas assistance): [email or web address].  Food Pantries: [facility]:  [phone] [address] [place], [place] [address]  [facility]:  [phone] [address]  [place], [place] [address]  Follow up Visits: Next Medicare AWV with our clinical staff: 11/09/24 1pm, telephone.    Next Office Visit with your provider: 11/17/23 11am, Dr Domenica, can get flu vaccine at this visit.  Clinician Recommendations:  Aim for 30 minutes of exercise or brisk walking, 6-8 glasses of water , and 5 servings of fruits and vegetables each day.   You will need to get the following vaccines at your local pharmacy:  COVID      This is a list of the screening recommended for you and due dates:  Health Maintenance  Topic Date Due   Zoster (Shingles) Vaccine (1 of 2) 10/19/1964   Flu Shot  10/03/2023   COVID-19 Vaccine (8 - Pfizer risk 2024-25 season) 11/03/2023   Eye exam for diabetics  11/05/2023   Medicare Annual Wellness Visit  11/06/2023*   DTaP/Tdap/Td vaccine (2 - Td or Tdap) 02/26/2024*   Mammogram  02/10/2024   Hemoglobin A1C  03/13/2024   Complete foot exam   05/27/2024   Yearly kidney function blood test for diabetes  09/10/2024   Yearly kidney health urinalysis for  diabetes  09/10/2024   Pneumococcal Vaccine for age over 78  Completed   DEXA scan (bone density measurement)  Completed   Hepatitis C Screening  Completed   HPV Vaccine  Aged Out   Meningitis B Vaccine  Aged Out   Colon Cancer Screening  Discontinued  *Topic was postponed. The date shown is not the original due date.   Please let me know if you do not receive your Advanced Directive Packet within 1 week. Once completed and notarized, you may return a copy to our office by either of the following.  Advanced directives: (Copy Requested) Please bring a copy of your health care power of attorney and living will to the office to be added to your chart at your convenience. You can mail to [facility] [address] [place], [place] [address] or email to [email or web address] Advance Care Planning is important because it:  [x]  Makes sure you receive the medical care that is consistent with your values, goals, and preferences  [x]  It provides guidance to your family and loved ones and reduces their decisional burden about whether or not they are making the right decisions based on your wishes.  Follow the link provided in your after visit summary or read over the paperwork we have mailed to you to help you started getting your Advance Directives in place. If you need assistance in completing these, please reach out to us  so that we can  help you!  See attachments for Preventive Care and Fall Prevention Tips.

## 2023-11-06 NOTE — Telephone Encounter (Signed)
 Pt had AWV today. Reports difficulty paying for gas and food. Provided [employer] via [employer] for [employer] and gas assistance.  Pt also having difficulty affording medications and is working with Tammy on pt assistance applications.   Pt requested referrals to Pulmonology and Nephrology be moved to [facility] and referral coordinator is working on this.

## 2023-11-10 NOTE — Telephone Encounter (Signed)
 PAP: Patient assistance application for Jardiance has been approved by PAP Companies: [employer] from 11/07/2023 to 03/03/2024. Medication should be delivered to PAP Delivery: Home. For further shipping updates, please contact [employer] ([employer]) at [phone]. Patient ID is: [id number]

## 2023-11-12 ENCOUNTER — Other Ambulatory Visit

## 2023-11-12 ENCOUNTER — Telehealth: Payer: Self-pay | Admitting: Pharmacist

## 2023-11-12 DIAGNOSIS — Z7984 Long term (current) use of oral hypoglycemic drugs: Secondary | ICD-10-CM | POA: Diagnosis not present

## 2023-11-12 DIAGNOSIS — H269 Unspecified cataract: Secondary | ICD-10-CM

## 2023-11-12 DIAGNOSIS — H43829 Vitreomacular adhesion, unspecified eye: Secondary | ICD-10-CM

## 2023-11-12 DIAGNOSIS — H524 Presbyopia: Secondary | ICD-10-CM | POA: Diagnosis not present

## 2023-11-12 DIAGNOSIS — H43823 Vitreomacular adhesion, bilateral: Secondary | ICD-10-CM | POA: Diagnosis not present

## 2023-11-12 DIAGNOSIS — H2513 Age-related nuclear cataract, bilateral: Secondary | ICD-10-CM | POA: Diagnosis not present

## 2023-11-12 DIAGNOSIS — E119 Type 2 diabetes mellitus without complications: Secondary | ICD-10-CM | POA: Diagnosis not present

## 2023-11-12 DIAGNOSIS — H52203 Unspecified astigmatism, bilateral: Secondary | ICD-10-CM | POA: Diagnosis not present

## 2023-11-12 DIAGNOSIS — H25013 Cortical age-related cataract, bilateral: Secondary | ICD-10-CM | POA: Diagnosis not present

## 2023-11-12 DIAGNOSIS — H119 Unspecified disorder of conjunctiva: Secondary | ICD-10-CM | POA: Diagnosis not present

## 2023-11-12 DIAGNOSIS — H5203 Hypermetropia, bilateral: Secondary | ICD-10-CM | POA: Diagnosis not present

## 2023-11-12 HISTORY — DX: Vitreomacular adhesion, unspecified eye: H43.829

## 2023-11-12 HISTORY — DX: Unspecified cataract: H26.9

## 2023-11-12 LAB — HM DIABETES EYE EXAM

## 2023-11-12 NOTE — Telephone Encounter (Signed)
 Attempt was made to contact patient by phone today for follow up by Clinical Pharmacist regarding patient assistance program fro Jardiance and Stiolto and to follow up type 2 DM.  Unable to reach patient. LM on VM with my contact number [phone].

## 2023-11-16 NOTE — Assessment & Plan Note (Signed)
Is avoiding NSAIDs

## 2023-11-16 NOTE — Assessment & Plan Note (Signed)
 Encouraged to get adequate exercise, calcium and vitamin d intake

## 2023-11-16 NOTE — Assessment & Plan Note (Signed)
 Encourage heart healthy diet such as MIND or DASH diet, increase exercise, avoid trans fats, simple carbohydrates and processed foods, consider a krill or fish or flaxseed oil cap daily.

## 2023-11-16 NOTE — Assessment & Plan Note (Signed)
 Taking  Metformin  500 mg po qam, minimize simple carbs. Increase exercise as tolerated. Continue current meds. Given rx for glucometer, lancets and test strips, check sugars daily and as needed

## 2023-11-17 ENCOUNTER — Telehealth: Payer: Self-pay

## 2023-11-17 ENCOUNTER — Other Ambulatory Visit (HOSPITAL_BASED_OUTPATIENT_CLINIC_OR_DEPARTMENT_OTHER): Payer: Self-pay

## 2023-11-17 ENCOUNTER — Encounter: Payer: Self-pay | Admitting: Family Medicine

## 2023-11-17 ENCOUNTER — Ambulatory Visit: Admitting: Family Medicine

## 2023-11-17 ENCOUNTER — Other Ambulatory Visit (HOSPITAL_COMMUNITY): Payer: Self-pay

## 2023-11-17 VITALS — BP 122/84 | HR 84 | Resp 16 | Ht 67.0 in | Wt 214.2 lb

## 2023-11-17 DIAGNOSIS — E1169 Type 2 diabetes mellitus with other specified complication: Secondary | ICD-10-CM | POA: Diagnosis not present

## 2023-11-17 DIAGNOSIS — M8580 Other specified disorders of bone density and structure, unspecified site: Secondary | ICD-10-CM | POA: Diagnosis not present

## 2023-11-17 DIAGNOSIS — K219 Gastro-esophageal reflux disease without esophagitis: Secondary | ICD-10-CM | POA: Diagnosis not present

## 2023-11-17 DIAGNOSIS — Z7984 Long term (current) use of oral hypoglycemic drugs: Secondary | ICD-10-CM | POA: Diagnosis not present

## 2023-11-17 DIAGNOSIS — E782 Mixed hyperlipidemia: Secondary | ICD-10-CM | POA: Diagnosis not present

## 2023-11-17 DIAGNOSIS — E669 Obesity, unspecified: Secondary | ICD-10-CM

## 2023-11-17 DIAGNOSIS — Z23 Encounter for immunization: Secondary | ICD-10-CM | POA: Diagnosis not present

## 2023-11-17 MED ORDER — FLUZONE HIGH-DOSE 0.5 ML IM SUSY
0.5000 mL | PREFILLED_SYRINGE | Freq: Once | INTRAMUSCULAR | 0 refills | Status: AC
  Filled 2023-11-17: qty 0.5, 1d supply, fill #0

## 2023-11-17 MED ORDER — OZEMPIC (0.25 OR 0.5 MG/DOSE) 2 MG/3ML ~~LOC~~ SOPN
0.2500 mg | PEN_INJECTOR | SUBCUTANEOUS | 1 refills | Status: DC
  Filled 2023-11-17: qty 3, 30d supply, fill #0

## 2023-11-17 NOTE — Telephone Encounter (Signed)
 Copied from CRM [id number]. Topic: Clinical - Medication Question >> Nov 17, 2023  4:05 PM Brenda [name] wrote: Reason for CRM: Pt calling in regards to Semaglutide ,0.25 or 0.5MG /DOS, (OZEMPIC , 0.25 OR 0.5 MG/DOSE,) 2 MG/3ML SOPN rx that she tried to pick up today. She said with her [employer] it would be over $200. She is wondering if there is maybe a generic or something else comparable that can be called in for her. Please give her a call at [phone] once complete.

## 2023-11-17 NOTE — Telephone Encounter (Signed)
Needs PA please.

## 2023-11-17 NOTE — Patient Instructions (Addendum)
 Let us  know in 3-6 weeks when you are ready to increase the Ozempic  and remember the goal is decreasing appetite and controlling sugars without you suffering with stomach trouble. So make sure your note let's me know if you are having any side effects  Lab appt 10/10-10/15 then next appt after 03/18/2024

## 2023-11-18 ENCOUNTER — Encounter: Payer: Self-pay | Admitting: Family Medicine

## 2023-11-18 ENCOUNTER — Other Ambulatory Visit (HOSPITAL_COMMUNITY): Payer: Self-pay

## 2023-11-18 NOTE — Progress Notes (Signed)
 Subjective:    Patient ID: Brenda [name], female    DOB: [birth year], 78 y.o.   MRN: [id number]  No chief complaint on file.   HPI Discussed the use of AI scribe software for clinical note transcription with the patient, who gave verbal consent to proceed.  History of Present Illness Brenda [name] is a 78 year old female with diabetes and emphysema who presents for medication management and follow-up.  She has started taking Jardiance today without experiencing any adverse effects such as dizziness. She is still waiting for her Stiolto and albuterol  inhalers, with assistance from her pharmacist to obtain them.  She experiences shortness of breath when walking and back pain associated with walking due to a spinal condition.  Her current medications include Jardiance, which she has started today, and she is awaiting her inhalers. She is considering the use of new diabetic medications that may aid in weight loss, as she has struggled to reduce her weight from 200 pounds despite previous efforts. She recalls maintaining a weight of 145 pounds when she was younger.  Her blood sugar was 142 this morning, which she describes as typical. Her last A1c was 8.4, and she is due for blood work in mid-October.    Past Medical History:  Diagnosis Date   Acute cough 08/21/2023   Anxiety 10/17/2010   Arthritis 11/25/2011   Carpal tunnel syndrome on right 01/20/2012   Cervical cancer screening 04/26/2013   Menarche at 14 regular  Menopause surgical at 27  G1P1 s/p SVD  Partial hysterectomy for fibroids with bladder tack  Tubal  No h/o abnormal pap, last pap prior to hysterectomy  Mgm UTD, no concerns     Chronic obstructive pulmonary disease (HCC) 08/07/2023   Colon cancer screening 04/17/2011   Coronary artery calcification seen on CAT scan    cardiologist--- dr edwyna   DDD (degenerative disc disease), lumbar    Depression    DOE (dyspnea on exertion)    Dry skin 05/28/2023   Evaluate for  OSA (obstructive sleep apnea) 02/14/2014   Feeling grief 09/30/2018   GERD (gastroesophageal reflux disease)    History of chicken pox 08/24/2012   childhood age 35     History of colon polyps 01/31/2021   History of obstructive sleep apnea    (04-29-2022  per pt was retested , no issue)  study in epic 05-01-2014 by dr burnard AHI 16.9 / hr w/ nocturnal oxygen saturation   History of uterine fibroid    Hyperlipidemia 04/26/2012   Hyperlipidemia, mixed    2011   Hypertension    Left hip pain 03/19/2016   Left knee pain 01/05/2019   Lower back injury, initial encounter 04/02/2016   Lumbar disc disease with radiculopathy 01/20/2012   LVH (left ventricular hypertrophy) 01/30/2021   Medicare annual wellness visit, subsequent 12/25/2014   Muscle spasm 12/25/2014   Numerous moles 05/09/2015   OA (osteoarthritis)    Obesity 10/17/2010   Osteoarthritis of left glenohumeral joint 12/05/2015   Osteopenia 03/19/2016   Oxygen desaturation during sleep 05/24/2014   Pneumonia of right middle lobe due to infectious organism 08/26/2023   Preventative health care 03/24/2016   Prolapse of anterior vaginal wall    Prolapse of vaginal vault after hysterectomy    Pulmonary nodule 08/07/2023   Renal insufficiency 09/11/2023   Right foot pain 08/07/2023   Seasonal allergies    SOB (shortness of breath) 08/21/2023   SUI (stress urinary incontinence, female) 08/24/2012   Type  2 diabetes mellitus with obesity (HCC) 01/31/2013   IMO SNOMED Dx Update Oct 2024     Urinary incontinence 08/24/2012    Past Surgical History:  Procedure Laterality Date   ANTERIOR AND POSTERIOR REPAIR WITH SACROSPINOUS FIXATION N/A 05/30/2022   Procedure: ANTERIOR  REPAIR WITH SACROSPINOUS FIXATION;  Surgeon: Marilynne Rosaline SAILOR, MD;  Location: [facility];  Service: Gynecology;  Laterality: N/A;   BLADDER SUSPENSION N/A 05/30/2022   Procedure: TRANSVAGINAL TAPE (TVT) PROCEDURE;  Surgeon: Marilynne Rosaline SAILOR, MD;  Location: [facility];  Service: Gynecology;  Laterality: N/A;   COLONOSCOPY  2018   CYSTOSCOPY N/A 05/30/2022   Procedure: CYSTOSCOPY;  Surgeon: Marilynne Rosaline SAILOR, MD;  Location[facility];  Service: Gynecology;  Laterality: N/A;   PERINEOPLASTY  05/30/2022   Procedure: PERINEOPLASTY;  Surgeon: Marilynne Rosaline SAILOR, MD;  Location: [facility];  Service: Gynecology;;   TOTAL VAGINAL HYSTERECTOMY  2004   @[facility] ;   W/   BILATERAL SALPINOOPHORECTOMY AND SLING PROCEDURE    Family History  Problem Relation Age of Onset   Dementia Mother    Heart disease Father 75   Hypertension Father    Colon cancer Sister 68   Dementia Sister    Breast cancer Sister 22   Thyroid  disease Sister    Depression Sister        anxiety   Obesity Daughter    Cancer Paternal Grandfather        bone    Social History   Socioeconomic History   Marital status: Divorced    Spouse name: Not on file   Number of children: Not on file   Years of education: Not on file   Highest education level: Not on file  Occupational History   Not on file  Tobacco Use   Smoking status: Former    Current packs/day: 0.00    Types: Cigarettes    Start date: 12    Quit date: 1975    Years since quitting: 50.7   Smokeless tobacco: Never  Vaping Use   Vaping status: Never Used  Substance and Sexual Activity   Alcohol  use: Not Currently    Comment: very rare   Drug use: Never   Sexual activity: Yes  Other Topics Concern   Not on file  Social History Narrative   Not on file   Social Drivers of Health   Financial Resource Strain: Medium Risk (11/06/2023)   Overall Financial Resource Strain (CARDIA)    Difficulty of Paying Living Expenses: Somewhat hard  Food Insecurity: No Food Insecurity (11/06/2023)   Hunger Vital Sign    Worried About Running Out of Food in the Last Year: Never true    Ran Out of Food in the Last Year: Never true  Transportation Needs:  No Transportation Needs (11/06/2023)   PRAPARE - Administrator, Civil Service (Medical): No    Lack of Transportation (Non-Medical): No  Physical Activity: Insufficiently Active (11/06/2023)   Exercise Vital Sign    Days of Exercise per Week: 1 day    Minutes of Exercise per Session: 60 min  Stress: No Stress Concern Present (11/06/2023)   [employer] of Occupational Health - Occupational Stress Questionnaire    Feeling of Stress: Only a little  Social Connections: Moderately Integrated (11/06/2023)   Social Connection and Isolation Panel    Frequency of Communication with Friends and Family: Three times a week    Frequency of Social  Gatherings with Friends and Family: Once a week    Attends Religious Services: 1 to 4 times per year    Active Member of [employer] or Organizations: No    Attends Engineer, structural: More than 4 times per year    Marital Status: Widowed  Intimate Partner Violence: Not At Risk (11/06/2023)   Humiliation, Afraid, Rape, and Kick questionnaire    Fear of Current or Ex-Partner: No    Emotionally Abused: No    Physically Abused: No    Sexually Abused: No    Outpatient Medications Prior to Visit  Medication Sig Dispense Refill   acetaminophen  (TYLENOL ) 500 MG tablet Take 1 tablet (500 mg total) by mouth every 6 (six) hours as needed (pain). 30 tablet 0   albuterol  (VENTOLIN  HFA) 108 (90 Base) MCG/ACT inhaler Inhale 1-2 puffs into the lungs every 4 (four) hours as needed for wheezing or shortness of breath. 6.7 g 2   Alcohol  Swabs  (DROPSAFE ALCOHOL  PREP) 70 % PADS USE AS DIRECTED ONCE A DAY. 100 each 3   aspirin 81 MG tablet Take 81 mg by mouth daily.     atorvastatin  (LIPITOR) 80 MG tablet Take 1 tablet (80 mg total) by mouth daily. 90 tablet 0   blood glucose meter kit and supplies Dispense based on patient and insurance preference. Use Prn to check blood suagr 1 each 1   Blood Glucose Monitoring Suppl (TRUE METRIX AIR GLUCOSE METER) w/Device  KIT Use to check glucose once a day.  Dx code: E11.9 1 kit 0   Calcium  Citrate-Vitamin D (CALCIUM  CITRATE + D) 250-5 MG-MCG TABS Take 2 tablets by mouth daily at 12 noon.     cholecalciferol (VITAMIN D3) 25 MCG (1000 UNIT) tablet Take 1,000 Units by mouth daily.     Cyanocobalamin (VITAMIN B 12 PO) Take 1,000 Units by mouth daily.     empagliflozin (JARDIANCE) 25 MG TABS tablet Take 1 tablet (25 mg total) by mouth daily before breakfast. (Patient not taking: Reported on 11/06/2023)     escitalopram  (LEXAPRO ) 20 MG tablet Take 2 tablets (40 mg total) by mouth daily. 180 tablet 2   famotidine  (PEPCID ) 40 MG tablet TAKE 1 TABLET EVERY DAY 90 tablet 3   glucose blood (TRUE METRIX BLOOD GLUCOSE TEST) test strip Use to check glucose once a day.  Dx code: E11.9 100 each 1   irbesartan  (AVAPRO ) 150 MG tablet Take 1 tablet (150 mg total) by mouth daily. 90 tablet 0   KRILL OIL OMEGA-3 PO Take by mouth daily.     metFORMIN  (GLUCOPHAGE ) 500 MG tablet Take 1 tablet (500 mg total) by mouth 2 (two) times daily with a meal. 180 tablet 0   Multiple Vitamins-Minerals (ALIVE WOMENS 50+ PO) Take by mouth daily.     OVER THE COUNTER MEDICATION Take 1 each by mouth daily. FAIR LIFE protein shake     Probiotic Product (PROBIOTIC DAILY) CAPS Take 1 capsule by mouth daily.     Tiotropium Bromide-Olodaterol (STIOLTO RESPIMAT ) 2.5-2.5 MCG/ACT AERS Inhale 2 puffs into the lungs daily. (Patient not taking: Reported on 11/06/2023) 4 g 3   traMADol  (ULTRAM ) 50 MG tablet Take 1 tablet by mouth every 6 (six) hours as needed for moderate pain or severe pain.     triamterene -hydrochlorothiazide  (MAXZIDE-25) 37.5-25 MG tablet Take 1 tablet by mouth daily. 90 tablet 0   TRUEplus Lancets 33G MISC Use to check glucose once a day.  Dx code: E11.9 100 each 1  TURMERIC PO Take 1 capsule by mouth daily.     No facility-administered medications prior to visit.    Allergies  Allergen Reactions   Meloxicam  Nausea Only and Nausea And  Vomiting    Review of Systems  Constitutional:  Negative for fever and malaise/fatigue.  HENT:  Negative for congestion.   Eyes:  Negative for blurred vision.  Respiratory:  Negative for shortness of breath.   Cardiovascular:  Negative for chest pain, palpitations and leg swelling.  Gastrointestinal:  Negative for abdominal pain, blood in stool and nausea.  Genitourinary:  Negative for dysuria and frequency.  Musculoskeletal:  Negative for falls.  Skin:  Negative for rash.  Neurological:  Negative for dizziness, loss of consciousness and headaches.  Endo/Heme/Allergies:  Negative for environmental allergies.  Psychiatric/Behavioral:  Negative for depression. The patient is not nervous/anxious.        Objective:    Physical Exam Constitutional:      General: She is not in acute distress.    Appearance: Normal appearance. She is well-developed. She is not toxic-appearing.  HENT:     Head: Normocephalic and atraumatic.     Right Ear: External ear normal.     Left Ear: External ear normal.     Nose: Nose normal.  Eyes:     General:        Right eye: No discharge.        Left eye: No discharge.     Conjunctiva/sclera: Conjunctivae normal.  Neck:     Thyroid : No thyromegaly.  Cardiovascular:     Rate and Rhythm: Normal rate and regular rhythm.     Heart sounds: Normal heart sounds. No murmur heard. Pulmonary:     Effort: Pulmonary effort is normal. No respiratory distress.     Breath sounds: Normal breath sounds.  Abdominal:     General: Bowel sounds are normal.     Palpations: Abdomen is soft.     Tenderness: There is no abdominal tenderness. There is no guarding.  Musculoskeletal:        General: Normal range of motion.     Cervical back: Neck supple.  Lymphadenopathy:     Cervical: No cervical adenopathy.  Skin:    General: Skin is warm and dry.  Neurological:     Mental Status: She is alert and oriented to person, place, and time.  Psychiatric:        Mood and  Affect: Mood normal.        Behavior: Behavior normal.        Thought Content: Thought content normal.        Judgment: Judgment normal.     BP 122/84   Pulse 84   Resp 16   Ht 5' 7 (1.702 m)   Wt 214 lb 3.2 oz (97.2 kg)   SpO2 96%   BMI 33.55 kg/m  Wt Readings from Last 3 Encounters:  11/17/23 214 lb 3.2 oz (97.2 kg)  11/06/23 211 lb (95.7 kg)  10/07/23 213 lb (96.6 kg)    Diabetic Foot Exam - Simple   No data filed    Lab Results  Component Value Date   WBC 4.3 09/11/2023   HGB 12.9 09/11/2023   HCT 38.8 09/11/2023   PLT 437.0 (H) 09/11/2023   GLUCOSE 92 09/11/2023   CHOL 144 09/11/2023   TRIG 138.0 09/11/2023   HDL 52.50 09/11/2023   LDLDIRECT 91.0 05/29/2021   LDLCALC 64 09/11/2023   ALT 11 09/11/2023   AST 12  09/11/2023   NA 137 09/11/2023   K 4.6 09/11/2023   CL 102 09/11/2023   CREATININE 1.20 09/11/2023   BUN 29 (H) 09/11/2023   CO2 27 09/11/2023   TSH 1.19 09/11/2023   HGBA1C 8.4 (H) 09/11/2023   MICROALBUR 2.2 (H) 09/11/2023    Lab Results  Component Value Date   TSH 1.19 09/11/2023   Lab Results  Component Value Date   WBC 4.3 09/11/2023   HGB 12.9 09/11/2023   HCT 38.8 09/11/2023   MCV 88.0 09/11/2023   PLT 437.0 (H) 09/11/2023   Lab Results  Component Value Date   NA 137 09/11/2023   K 4.6 09/11/2023   CO2 27 09/11/2023   GLUCOSE 92 09/11/2023   BUN 29 (H) 09/11/2023   CREATININE 1.20 09/11/2023   BILITOT 0.4 09/11/2023   ALKPHOS 84 09/11/2023   AST 12 09/11/2023   ALT 11 09/11/2023   PROT 7.2 09/11/2023   ALBUMIN 3.8 09/11/2023   CALCIUM  9.0 09/11/2023   ANIONGAP 12 03/18/2023   GFR 43.54 (L) 09/11/2023   Lab Results  Component Value Date   CHOL 144 09/11/2023   Lab Results  Component Value Date   HDL 52.50 09/11/2023   Lab Results  Component Value Date   LDLCALC 64 09/11/2023   Lab Results  Component Value Date   TRIG 138.0 09/11/2023   Lab Results  Component Value Date   CHOLHDL 3 09/11/2023   Lab  Results  Component Value Date   HGBA1C 8.4 (H) 09/11/2023       Assessment & Plan:  Gastroesophageal reflux disease without esophagitis Assessment & Plan: Is avoiding NSAIDs  Orders: -     CBC with Differential/Platelet; Future -     TSH; Future  Hyperlipidemia, mixed Assessment & Plan: Encourage heart healthy diet such as MIND or DASH diet, increase exercise, avoid trans fats, simple carbohydrates and processed foods, consider a krill or fish or flaxseed oil cap daily.    Orders: -     Comprehensive metabolic panel with GFR; Future -     Lipid panel; Future -     TSH; Future  Osteopenia, unspecified location Assessment & Plan: Encouraged to get adequate exercise, calcium  and vitamin d intake    Type 2 diabetes mellitus with obesity (HCC) Assessment & Plan: Taking  Metformin  500 mg po qam, minimize simple carbs. Increase exercise as tolerated. Continue current meds. Given rx for glucometer, lancets and test strips, check sugars daily and as needed  Orders: -     Ozempic  (0.25 or 0.5 MG/DOSE); Inject 0.25 mg into the skin once a week.  Dispense: 3 mL; Refill: 1 -     Hemoglobin A1c; Future -     TSH; Future  Influenza vaccine administered -     Flu vaccine HIGH DOSE PF(Fluzone  Trivalent)    Assessment and Plan Assessment & Plan Type 2 diabetes mellitus with obesity Type 2 diabetes mellitus with obesity, currently managed with Jardiance. Current A1c is 8.4%. - Prescribe Ozempic , starting at the lowest dose, with plans to titrate up based on tolerance and effectiveness. - Continue Jardiance initially, with potential to discontinue if Ozempic  is effective. - Order blood work for mid-October to monitor A1c and kidney function. - Communicate via MyChart or phone to adjust Ozempic  dosage every 1-2 months. - Discuss potential side effects of Ozempic , including gastrointestinal issues.  Back pain with degenerative spine disease Chronic back pain due to degenerative spine  disease. She is considering surgical options  but is advised to manage weight as a non-surgical intervention. Weight loss may alleviate back pain and improve mobility. - Continue monitoring with specialist appointment on October 15th. - Discuss potential benefits of weight loss on back pain management. - Consider prescribing weight loss medications like Ozempic  to aid in weight management.  Chronic obstructive pulmonary disease (COPD) Chronic obstructive pulmonary disease with dyspnea on exertion. She is awaiting Stiolto and albuterol  inhalers. - Ensure Stiolto and albuterol  inhalers are obtained from the pharmacy.  General Health Maintenance Routine vaccinations and screenings are due. - Administer high-dose flu vaccine today. - Advise obtaining the new COVID-19 vaccine from the pharmacy.  Recording duration: 13 minutes     Harlene Horton, MD

## 2023-11-19 ENCOUNTER — Ambulatory Visit: Admitting: Pulmonary Disease

## 2023-11-19 ENCOUNTER — Encounter: Payer: Self-pay | Admitting: Pulmonary Disease

## 2023-11-19 ENCOUNTER — Ambulatory Visit (INDEPENDENT_AMBULATORY_CARE_PROVIDER_SITE_OTHER): Admitting: Pulmonary Disease

## 2023-11-19 ENCOUNTER — Other Ambulatory Visit: Payer: Self-pay

## 2023-11-19 ENCOUNTER — Other Ambulatory Visit (HOSPITAL_COMMUNITY): Payer: Self-pay

## 2023-11-19 VITALS — BP 118/78 | HR 87 | Ht 66.75 in | Wt 216.0 lb

## 2023-11-19 DIAGNOSIS — R918 Other nonspecific abnormal finding of lung field: Secondary | ICD-10-CM

## 2023-11-19 DIAGNOSIS — I5189 Other ill-defined heart diseases: Secondary | ICD-10-CM

## 2023-11-19 DIAGNOSIS — J439 Emphysema, unspecified: Secondary | ICD-10-CM

## 2023-11-19 DIAGNOSIS — J449 Chronic obstructive pulmonary disease, unspecified: Secondary | ICD-10-CM | POA: Diagnosis not present

## 2023-11-19 LAB — PULMONARY FUNCTION TEST
DL/VA % pred: 78 %
DL/VA: 3.15 ml/min/mmHg/L
DLCO unc % pred: 55 %
DLCO unc: 11.6 ml/min/mmHg
FEF 25-75 Post: 1.24 L/s
FEF 25-75 Pre: 1.05 L/s
FEF2575-%Change-Post: 18 %
FEF2575-%Pred-Post: 74 %
FEF2575-%Pred-Pre: 62 %
FEV1-%Change-Post: 3 %
FEV1-%Pred-Post: 80 %
FEV1-%Pred-Pre: 77 %
FEV1-Post: 1.82 L
FEV1-Pre: 1.76 L
FEV1FVC-%Change-Post: 1 %
FEV1FVC-%Pred-Pre: 92 %
FEV6-%Change-Post: 2 %
FEV6-%Pred-Post: 88 %
FEV6-%Pred-Pre: 86 %
FEV6-Post: 2.55 L
FEV6-Pre: 2.5 L
FEV6FVC-%Change-Post: 0 %
FEV6FVC-%Pred-Post: 103 %
FEV6FVC-%Pred-Pre: 103 %
FVC-%Change-Post: 2 %
FVC-%Pred-Post: 85 %
FVC-%Pred-Pre: 83 %
FVC-Post: 2.61 L
FVC-Pre: 2.55 L
Post FEV1/FVC ratio: 70 %
Post FEV6/FVC ratio: 98 %
Pre FEV1/FVC ratio: 69 %
Pre FEV6/FVC Ratio: 98 %
RV % pred: 100 %
RV: 2.49 L
TLC % pred: 87 %
TLC: 4.76 L

## 2023-11-19 MED ORDER — STIOLTO RESPIMAT 2.5-2.5 MCG/ACT IN AERS: 2.0000 | INHALATION_SPRAY | Freq: Every day | RESPIRATORY_TRACT | 6 refills | Status: DC

## 2023-11-19 NOTE — Patient Instructions (Signed)
 Full PFT performed today.

## 2023-11-19 NOTE — Progress Notes (Signed)
 HADLEY SOILEAU    [id number]    [birth year]  Primary Care Physician:Blyth, Harlene LABOR, MD  Referring Physician: [name], MD [address] [place],  [place] [address]  Chief complaint:   Patient being seen for shortness of breath History of COPD  HPI:  Recently hospitalized for pneumonia Symptoms overall are improving  Still has a cough, minimal sputum production  Overall feels better  No fevers, no chills  Trying to get more active  Past history of smoking, quit over 40 years ago Has a history of hypertension, diabetes  Not aware of a diagnosis of COPD previously  Recently started on albuterol  Has not been able to pick up Stiolto as it is not covered - Paperwork signed for patient assistance  Denies chronic cough, denies chronic shortness of breath although lately has been more short of breath because of back pain and discomfort No chronic fatigue, no weight loss,  She does get winded with moderate to significant exertion   Outpatient Encounter Medications as of 11/19/2023  Medication Sig   acetaminophen  (TYLENOL ) 500 MG tablet Take 1 tablet (500 mg total) by mouth every 6 (six) hours as needed (pain).   albuterol  (VENTOLIN  HFA) 108 (90 Base) MCG/ACT inhaler Inhale 1-2 puffs into the lungs every 4 (four) hours as needed for wheezing or shortness of breath.   Alcohol  Swabs  (DROPSAFE ALCOHOL  PREP) 70 % PADS USE AS DIRECTED ONCE A DAY.   aspirin 81 MG tablet Take 81 mg by mouth daily.   atorvastatin  (LIPITOR) 80 MG tablet Take 1 tablet (80 mg total) by mouth daily.   blood glucose meter kit and supplies Dispense based on patient and insurance preference. Use Prn to check blood suagr   Blood Glucose Monitoring Suppl (TRUE METRIX AIR GLUCOSE METER) w/Device KIT Use to check glucose once a day.  Dx code: E11.9   Calcium  Citrate-Vitamin D (CALCIUM  CITRATE + D) 250-5 MG-MCG TABS Take 2 tablets by mouth daily at 12 noon.   cholecalciferol (VITAMIN  D3) 25 MCG (1000 UNIT) tablet Take 1,000 Units by mouth daily.   Cyanocobalamin (VITAMIN B 12 PO) Take 1,000 Units by mouth daily.   empagliflozin (JARDIANCE) 25 MG TABS tablet Take 1 tablet (25 mg total) by mouth daily before breakfast.   escitalopram  (LEXAPRO ) 20 MG tablet Take 2 tablets (40 mg total) by mouth daily.   famotidine  (PEPCID ) 40 MG tablet TAKE 1 TABLET EVERY DAY   glucose blood (TRUE METRIX BLOOD GLUCOSE TEST) test strip Use to check glucose once a day.  Dx code: E11.9   Influenza vac split trivalent PF (FLUZONE  HIGH-DOSE) 0.5 ML injection Inject 0.5 mLs into the muscle once for 1 dose.   irbesartan  (AVAPRO ) 150 MG tablet Take 1 tablet (150 mg total) by mouth daily.   KRILL OIL OMEGA-3 PO Take by mouth daily.   metFORMIN  (GLUCOPHAGE ) 500 MG tablet Take 1 tablet (500 mg total) by mouth 2 (two) times daily with a meal.   Multiple Vitamins-Minerals (ALIVE WOMENS 50+ PO) Take by mouth daily.   OVER THE COUNTER MEDICATION Take 1 each by mouth daily. FAIR LIFE protein shake   Probiotic Product (PROBIOTIC DAILY) CAPS Take 1 capsule by mouth daily.   Semaglutide ,0.25 or 0.5MG /DOS, (OZEMPIC , 0.25 OR 0.5 MG/DOSE,) 2 MG/3ML SOPN Inject 0.25 mg into the skin once a week.   traMADol  (ULTRAM ) 50 MG tablet Take 1 tablet by mouth every 6 (six) hours as  needed for moderate pain or severe pain.   triamterene -hydrochlorothiazide  (MAXZIDE-25) 37.5-25 MG tablet Take 1 tablet by mouth daily.   TRUEplus Lancets 33G MISC Use to check glucose once a day.  Dx code: E11.9   TURMERIC PO Take 1 capsule by mouth daily.   Tiotropium Bromide-Olodaterol (STIOLTO RESPIMAT ) 2.5-2.5 MCG/ACT AERS Inhale 2 puffs into the lungs daily. (Patient not taking: Reported on 11/19/2023)   No facility-administered encounter medications on file as of 11/19/2023.    Allergies as of 11/19/2023 - Review Complete 11/19/2023  Allergen Reaction Noted   Meloxicam  Nausea Only and Nausea And Vomiting 01/20/2012    Past Medical  History:  Diagnosis Date   Acute cough 08/21/2023   Anxiety 10/17/2010   Arthritis 11/25/2011   Carpal tunnel syndrome on right 01/20/2012   Cervical cancer screening 04/26/2013   Menarche at 14 regular  Menopause surgical at 38  G1P1 s/p SVD  Partial hysterectomy for fibroids with bladder tack  Tubal  No h/o abnormal pap, last pap prior to hysterectomy  Mgm UTD, no concerns     Chronic obstructive pulmonary disease (HCC) 08/07/2023   Colon cancer screening 04/17/2011   Coronary artery calcification seen on CAT scan    cardiologist--- dr edwyna   DDD (degenerative disc disease), lumbar    Depression    DOE (dyspnea on exertion)    Dry skin 05/28/2023   Evaluate for OSA (obstructive sleep apnea) 02/14/2014   Feeling grief 09/30/2018   GERD (gastroesophageal reflux disease)    History of chicken pox 08/24/2012   childhood age 32     History of colon polyps 01/31/2021   History of obstructive sleep apnea    (04-29-2022  per pt was retested , no issue)  study in epic 05-01-2014 by dr burnard [facility] 16.9 / hr w/ nocturnal oxygen saturation   History of uterine fibroid    Hyperlipidemia 04/26/2012   Hyperlipidemia, mixed    2011   Hypertension    Left hip pain 03/19/2016   Left knee pain 01/05/2019   Lower back injury, initial encounter 04/02/2016   Lumbar disc disease with radiculopathy 01/20/2012   LVH (left ventricular hypertrophy) 01/30/2021   Medicare annual wellness visit, subsequent 12/25/2014   Muscle spasm 12/25/2014   Numerous moles 05/09/2015   OA (osteoarthritis)    Obesity 10/17/2010   Osteoarthritis of left glenohumeral joint 12/05/2015   Osteopenia 03/19/2016   Oxygen desaturation during sleep 05/24/2014   Pneumonia of right middle lobe due to infectious organism 08/26/2023   Preventative health care 03/24/2016   Prolapse of anterior vaginal wall    Prolapse of vaginal vault after hysterectomy    Pulmonary nodule 08/07/2023   Renal insufficiency 09/11/2023    Right foot pain 08/07/2023   Seasonal allergies    SOB (shortness of breath) 08/21/2023   SUI (stress urinary incontinence, female) 08/24/2012   Type 2 diabetes mellitus with obesity (HCC) 01/31/2013   IMO SNOMED Dx Update Oct 2024     Urinary incontinence 08/24/2012    Past Surgical History:  Procedure Laterality Date   ANTERIOR AND POSTERIOR REPAIR WITH SACROSPINOUS FIXATION N/A 05/30/2022   Procedure: ANTERIOR  REPAIR WITH SACROSPINOUS FIXATION;  Surgeon: Marilynne Rosaline SAILOR, MD;  Location: [facility];  Service: Gynecology;  Laterality: N/A;   BLADDER SUSPENSION N/A 05/30/2022   Procedure: TRANSVAGINAL TAPE (TVT) PROCEDURE;  Surgeon: Marilynne Rosaline SAILOR, MD;  Location: [facility];  Service: Gynecology;  Laterality: N/A;   COLONOSCOPY  2018  CYSTOSCOPY N/A 05/30/2022   Procedure: CYSTOSCOPY;  Surgeon: Marilynne Rosaline SAILOR, MD;  Location: [facility];  Service: Gynecology;  Laterality: N/A;   PERINEOPLASTY  05/30/2022   Procedure: PERINEOPLASTY;  Surgeon: Marilynne Rosaline SAILOR, MD;  Location: [facility];  Service: Gynecology;;   TOTAL VAGINAL HYSTERECTOMY  2004   @[facility] ;   W/   BILATERAL SALPINOOPHORECTOMY AND SLING PROCEDURE    Family History  Problem Relation Age of Onset   Dementia Mother    Heart disease Father 47   Hypertension Father    Colon cancer Sister 39   Dementia Sister    Breast cancer Sister 66   Thyroid  disease Sister    Depression Sister        anxiety   Obesity Daughter    Cancer Paternal Grandfather        bone    Social History   Socioeconomic History   Marital status: Divorced    Spouse name: Not on file   Number of children: Not on file   Years of education: Not on file   Highest education level: Not on file  Occupational History   Not on file  Tobacco Use   Smoking status: Former    Current packs/day: 0.00    Types: Cigarettes    Start date: 35    Quit date: 1975    Years  since quitting: 50.7   Smokeless tobacco: Never  Vaping Use   Vaping status: Never Used  Substance and Sexual Activity   Alcohol  use: Not Currently    Comment: very rare   Drug use: Never   Sexual activity: Yes  Other Topics Concern   Not on file  Social History Narrative   Not on file   Social Drivers of Health   Financial Resource Strain: Medium Risk (11/06/2023)   Overall Financial Resource Strain (CARDIA)    Difficulty of Paying Living Expenses: Somewhat hard  Food Insecurity: No Food Insecurity (11/06/2023)   Hunger Vital Sign    Worried About Running Out of Food in the Last Year: Never true    Ran Out of Food in the Last Year: Never true  Transportation Needs: No Transportation Needs (11/06/2023)   PRAPARE - Administrator, Civil Service (Medical): No    Lack of Transportation (Non-Medical): No  Physical Activity: Insufficiently Active (11/06/2023)   Exercise Vital Sign    Days of Exercise per Week: 1 day    Minutes of Exercise per Session: 60 min  Stress: No Stress Concern Present (11/06/2023)   [employer] of Occupational Health - Occupational Stress Questionnaire    Feeling of Stress: Only a little  Social Connections: Moderately Integrated (11/06/2023)   Social Connection and Isolation Panel    Frequency of Communication with Friends and Family: Three times a week    Frequency of Social Gatherings with Friends and Family: Once a week    Attends Religious Services: 1 to 4 times per year    Active Member of [employer] or Organizations: No    Attends Engineer, structural: More than 4 times per year    Marital Status: Widowed  Intimate Partner Violence: Not At Risk (11/06/2023)   Humiliation, Afraid, Rape, and Kick questionnaire    Fear of Current or Ex-Partner: No    Emotionally Abused: No    Physically Abused: No    Sexually Abused: No    Review of Systems  Respiratory:  Positive for cough and shortness of breath.  Vitals:   11/19/23 1054  BP:  118/78  Pulse: 87  SpO2: 97%   Physical Exam Constitutional:      Appearance: She is obese.  HENT:     Head: Normocephalic.     Mouth/Throat:     Mouth: Mucous membranes are moist.  Eyes:     General: No scleral icterus. Cardiovascular:     Rate and Rhythm: Normal rate and regular rhythm.     Heart sounds: No murmur heard.    No friction rub.  Pulmonary:     Effort: No respiratory distress.     Breath sounds: No stridor. No wheezing or rhonchi.  Musculoskeletal:     Cervical back: No rigidity or tenderness.  Neurological:     Mental Status: She is alert.  Psychiatric:        Mood and Affect: Mood normal.     Data Reviewed: Echocardiogram 03/06/2022 with diastolic dysfunction, normal ejection fraction  Recent CT scan of the chest 09/11/2023 reviewed with the patient and compared with a cardiac CT that was performed 02/18/2022 Left lower lobe nodule is stable Extensive emphysema Multiple nodularities, areas of mucous plugging  PFT reviewed with the patient showing mild obstructive disease with no significant bronchodilator response, no restriction, moderate reduction in diffusing capacity  Assessment:  Recent pneumonia -Completed course of antibiotics Overall continues to feel better  Severe emphysema on CT scan with lung nodules - Will plan for repeat CT for follow-up  Chronic obstructive pulmonary disease with emphysema - On albuterol  - Paperwork for [employer] filled out-patient assistance  Diastolic dysfunction - Continue risk factor modification   Abnormal CT scan of the chest showing multifocal infiltrates, extensive emphysema  Chronic obstructive pulmonary disease with emphysema - Was prescribed Symbicort  - Symbicort  was very expensive to pick up - Stiolto appears to be on formulary-will initiate this at present - Albuterol  use as needed  Diastolic dysfunction - Risk factor modification - On Jardiance  Concern for MAI on CT scan findings - Most  symptoms were acute Symptoms have overall resolved - Follow-up CT will be ordered  Chronic musculoskeletal pain and discomfort  Type 2 diabetes  Hypertension  Plan/Recommendations: Schedule CT scan of the chest  Paperwork for Stiolto signed  Continue albuterol  as needed  Graded activities as tolerated  Weight loss efforts  PFT and CT reviewed with patient during his visit  Tentative follow-up in about 3 months  Encouraged to call with significant concerns   Jennet Epley MD [facility] 11/19/2023, 10:56 AM  CC: [name], MD

## 2023-11-19 NOTE — Progress Notes (Signed)
 Full PFT performed today.

## 2023-11-19 NOTE — Telephone Encounter (Signed)
 Patient's copay with insurance is $273.79. Cash price without insurance would be over $1200. Prior Authorization is not required.

## 2023-11-19 NOTE — Patient Instructions (Signed)
 I will place order for follow-up CT to follow-up on the haziness in the lungs  Your breathing study does show mild obstruction  Continue with inhalers  Continue graded exercises as tolerated  Follow-up in about 3 months  Call us  with significant concerns

## 2023-11-21 ENCOUNTER — Encounter: Payer: Self-pay | Admitting: Family Medicine

## 2023-11-21 ENCOUNTER — Other Ambulatory Visit (HOSPITAL_BASED_OUTPATIENT_CLINIC_OR_DEPARTMENT_OTHER): Payer: Self-pay

## 2023-11-21 ENCOUNTER — Other Ambulatory Visit: Payer: Self-pay | Admitting: Family Medicine

## 2023-11-21 ENCOUNTER — Telehealth: Payer: Self-pay | Admitting: Pulmonary Disease

## 2023-11-21 MED ORDER — STIOLTO RESPIMAT 2.5-2.5 MCG/ACT IN AERS: 2.0000 | INHALATION_SPRAY | Freq: Every day | RESPIRATORY_TRACT | 6 refills | Status: AC

## 2023-11-21 MED ORDER — TIRZEPATIDE 2.5 MG/0.5ML ~~LOC~~ SOAJ
2.5000 mg | SUBCUTANEOUS | 1 refills | Status: DC
  Filled 2023-11-21: qty 2, 28d supply, fill #0

## 2023-11-21 MED ORDER — STIOLTO RESPIMAT 2.5-2.5 MCG/ACT IN AERS: 2.0000 | INHALATION_SPRAY | Freq: Every day | RESPIRATORY_TRACT | 6 refills | Status: DC

## 2023-11-21 NOTE — Telephone Encounter (Signed)
 Copied from CRM [id number]. Topic: Clinical - Prescription Issue >> Nov 21, 2023 11:13 AM [name] wrote: Reason for CRM: Tiotropium Bromide-Olodaterol (STIOLTO RESPIMAT ) 2.5-2.5 MCG/ACT AERS This Rx says print, so pt was unable to p/u at the pharmacy.  Pt states she needs real badly.  This is replacing another she was on.  Can you resend, Please?  Thank you

## 2023-11-21 NOTE — Telephone Encounter (Signed)
 Please advise on the following:  This Rx says print, so pt was unable to p/u at the pharmacy.  Pt states she needs real badly.  This is replacing another she was on.  Can you resend, Please?  Thank you

## 2023-11-21 NOTE — Telephone Encounter (Signed)
 RX sent to pharmacy

## 2023-11-21 NOTE — Telephone Encounter (Addendum)
Patient was advise and verbalized understanding

## 2023-11-24 ENCOUNTER — Other Ambulatory Visit (HOSPITAL_BASED_OUTPATIENT_CLINIC_OR_DEPARTMENT_OTHER): Payer: Self-pay

## 2023-11-26 ENCOUNTER — Telehealth: Payer: Self-pay

## 2023-11-26 ENCOUNTER — Telehealth: Payer: Self-pay | Admitting: Pharmacist

## 2023-11-26 ENCOUNTER — Ambulatory Visit (INDEPENDENT_AMBULATORY_CARE_PROVIDER_SITE_OTHER): Admitting: Pharmacist

## 2023-11-26 DIAGNOSIS — N1832 Chronic kidney disease, stage 3b: Secondary | ICD-10-CM

## 2023-11-26 DIAGNOSIS — E669 Obesity, unspecified: Secondary | ICD-10-CM

## 2023-11-26 DIAGNOSIS — E1169 Type 2 diabetes mellitus with other specified complication: Secondary | ICD-10-CM

## 2023-11-26 DIAGNOSIS — J449 Chronic obstructive pulmonary disease, unspecified: Secondary | ICD-10-CM

## 2023-11-26 NOTE — Telephone Encounter (Signed)
 Second unsuccessful attempt was made to contact patient by phone today for follow up by Clinical Pharmacist regarding medication cost and diabetes.  Unable to reach patient with cell phone number. Unable to LM on VM due to Mailbox was full.  I did try on her home phone too. Unable to reach patient but I was able to LM with my CB# [phone].

## 2023-11-26 NOTE — Telephone Encounter (Signed)
 PAP: Patient assistance application for Stioloto through [employer]) has been mailed to pt's home address on file. Provider portion of application will be faxed to provider's office.

## 2023-11-26 NOTE — Telephone Encounter (Signed)
 PAP: Patient assistance application for Ozempic  through [employer] has been mailed to pt's home address on file. Provider portion of application will be faxed to provider's office. Patient portion e-filed.

## 2023-11-26 NOTE — Progress Notes (Signed)
 11/26/2023 Name: Brenda [name] MRN: [id number] DOB: 1945-04-19  Chief Complaint  Patient presents with   Diabetes   Medication Management    Brenda [name] is a 78 y.o. year old female who presented for a telephone visit.   They were referred to the pharmacist by their PCP for assistance in managing medication access.    Subjective:  Care Team: Primary Care Provider: [name], MD ; Next Scheduled Visit: 04/02/2023 Cardiologist: Dr Edwyna; Next Scheduled Visit: not currently scheduled  Pulmonologist: Dr Neda; Next Scheduled Visit: 02/18/2024  Medication Access/Adherence  Current Pharmacy:  [facility] - [place], [place] - [address] [place] [address] Phone: [phone] Fax: [phone]  [facility] [place] [facility] [address] [place] [address] Phone: [phone] Fax: [phone]   Patient reports affordability concerns with their medications: Yes  - Cost of Stiolto and Spiriva  too high; Dr Domenica has also suggested starting Ozempic     Brand meds are $47 / month or $131 / 90 days but she has a $250 deductible and initial cost for branded med would be $297.  Patient reports access/transportation concerns to their pharmacy: No  Patient reports adherence concerns with their medications:  No      Diabetes:  Current medications:  metformin  500mg  twice a day (increased from once daily - 09/14/2023) Jardiance 25mg  daily - started 1 week ago.   Patient received letter from [employer] that she was ineligible for LIS / Extra Help. I received letter today. She has completed application with [employer] for jardiance patient assistance program but [employer] needs a copy of LIS denial letter.   Patient noted to have low eGFR and elevated UACR at last check. There is no previous UACR to compare to.  eGFR has been as low as 34.45 (05/28/2023)   Last UACR = 25.3  (09/11/2023)   Lab Results  Component Value Date   NA 137 09/11/2023   CL 102 09/11/2023   K 4.6 09/11/2023   CO2 27 09/11/2023   BUN 29 (H) 09/11/2023   CREATININE 1.20 09/11/2023   GFR 43.54 (L) 09/11/2023   CALCIUM  9.0 09/11/2023   PHOS 2.6 12/20/2013   ALBUMIN 3.8 09/11/2023   GLUCOSE 92 09/11/2023     Current glucose readings: 169, 239  Patient denies hypoglycemic s/sx including no dizziness, shakiness, sweating. Patient denies hyperglycemic symptoms including no polyuria, polydipsia, polyphagia, nocturia, neuropathy, blurred vision.  Current medication access support: none but [employer] application is pending.   COPD: She had her first visit with pulmonologist Dr Neda 09/18/2023  Current medications: has albuterol  inhaler. She was initially prescribed Stioloto on 09/18/2023. Looks like was She was approved to get Jardiance form [facility] - we should be able to add on Stiolto.    Reports 1 exacerbations that require prednisone  and antibiotic course x 2 in the past year  Current medication access support: none but she should be able to get Stiolto from [facility]    Objective:  Lab Results  Component Value Date   HGBA1C 8.4 (H) 09/11/2023    Lab Results  Component Value Date   CREATININE 1.20 09/11/2023   BUN 29 (H) 09/11/2023   NA 137 09/11/2023   K 4.6 09/11/2023   CL 102 09/11/2023   CO2 27 09/11/2023    Lab Results  Component Value Date   CHOL 144 09/11/2023   HDL 52.50  09/11/2023   LDLCALC 64 09/11/2023   LDLDIRECT 91.0 05/29/2021   TRIG 138.0 09/11/2023   CHOLHDL 3 09/11/2023    Medications Reviewed Today     Reviewed by Carla Milling, RPH-CPP (Pharmacist) on 11/26/23 at 1528  Med List Status: <None>   Medication Order Taking? Sig Documenting Provider Last Dose Status Informant  acetaminophen  (TYLENOL ) 500 MG tablet [id number] Yes Take 1 tablet (500 mg total) by mouth every 6 (six) hours as needed (pain). Zuleta, Kaitlin G, NP  Active    albuterol  (VENTOLIN  HFA) 108 (90 Base) MCG/ACT inhaler [id number]  Inhale 1-2 puffs into the lungs every 4 (four) hours as needed for wheezing or shortness of breath. Yacopino, Jessica L, NP  Active   Alcohol  Swabs  (DROPSAFE ALCOHOL  PREP) 70 % PADS [id number]  USE AS DIRECTED ONCE A DAY. [name], MD  Active   aspirin 81 MG tablet [id number] Yes Take 81 mg by mouth daily. [provider]  Active Self  atorvastatin  (LIPITOR) 80 MG tablet [id number] Yes Take 1 tablet (80 mg total) by mouth daily. [name], MD  Active   blood glucose meter kit and supplies [id number]  Dispense based on patient and insurance preference. Use Prn to check blood suagr [name], MD  Active   Blood Glucose Monitoring Suppl (TRUE METRIX AIR GLUCOSE METER) w/Device KIT [id number]  Use to check glucose once a day.  Dx code: E11.9 [name], MD  Active   Calcium  Citrate-Vitamin D (CALCIUM  CITRATE + D) 250-5 MG-MCG TABS [id number]  Take 2 tablets by mouth daily at 12 noon. [provider]  Active   cholecalciferol (VITAMIN D3) 25 MCG (1000 UNIT) tablet [id number]  Take 1,000 Units by mouth daily. [provider]  Active   Cyanocobalamin (VITAMIN B 12 PO) [id number]  Take 1,000 Units by mouth daily. [provider]  Active Self  empagliflozin (JARDIANCE) 25 MG TABS tablet [id number] Yes Take 1 tablet (25 mg total) by mouth daily before breakfast. [name], MD  Active   escitalopram  (LEXAPRO ) 20 MG tablet [id number] Yes Take 2 tablets (40 mg total) by mouth daily. Cyndi Shaver, PA-C  Active   famotidine  (PEPCID ) 40 MG tablet [id number] Yes TAKE 1 TABLET EVERY DAY [name], MD  Active   glucose blood (TRUE METRIX BLOOD GLUCOSE TEST) test strip [id number] Yes Use to check glucose once a day.  Dx code: E11.9 [name], MD  Active   irbesartan  (AVAPRO ) 150 MG tablet [id number] Yes Take 1 tablet (150 mg total) by mouth daily. [name], MD  Active   KRILL OIL  OMEGA-3 PO [id number] Yes Take by mouth daily. [provider]  Active Self  metFORMIN  (GLUCOPHAGE ) 500 MG tablet [id number]  Take 1 tablet (500 mg total) by mouth 2 (two) times daily with a meal.  Patient not taking: Reported on 11/26/2023   Wheeler Harlene CROME, NP  Active   Multiple Vitamins-Minerals (ALIVE WOMENS 50+ PO) [id number] Yes Take by mouth daily. [provider]  Active Self  OVER THE COUNTER MEDICATION [id number] Yes Take 1 each by mouth daily. FAIR LIFE protein shake [provider]  Active   Probiotic Product (PROBIOTIC DAILY) CAPS [id number] Yes Take 1 capsule by mouth daily. [provider]  Active Self  Tiotropium Bromide-Olodaterol (STIOLTO RESPIMAT ) 2.5-2.5 MCG/ACT AERS [id number]  Inhale 2 puffs into the lungs daily.  Patient not taking: Reported on 11/26/2023   Neda Jennet LABOR, MD  Active  tirzepatide  (MOUNJARO ) 2.5 MG/0.5ML Pen [id number]  Inject 2.5 mg into the skin once a week.  Patient not taking: Reported on 11/26/2023   [name], MD  Active   traMADol  (ULTRAM ) 50 MG tablet [id number] Yes Take 1 tablet by mouth every 6 (six) hours as needed for moderate pain or severe pain. [provider]  Active   triamterene -hydrochlorothiazide  (MAXZIDE-25) 37.5-25 MG tablet [id number] Yes Take 1 tablet by mouth daily. [name], MD  Active   TRUEplus Lancets 33G MISC [id number] Yes Use to check glucose once a day.  Dx code: E11.9 [name], MD  Active   TURMERIC PO [id number]  Take 1 capsule by mouth daily. [provider]  Active Self              Assessment/Plan:   Diabetes: Currently uncontrolled per last A1c but home blood glucose improving. Also noted to have elevated UACR and low eGFR (G3b / A1 - based on one result 09/11/2023) - Recommend to restart metformin  500mg  twice a day with food.  - Continue Jardiance 25mg  daily which would benefit blood glucose and provide cardiorenal protection.  - Recheck  UACR / microalbumin in 3 to 6 months - Will request application for Novo Nordisk / Ozempic  to be sent to patient - will start if approved.   COPD: - Currently uncontrolled.  - Provided education about rescue / albuterol  inhaler versus maintenance / Stiolto  - Meets financial criteria for either [employer] or Spiriva  patient assistance program through [employer]. Will collaborate with provider, CPhT, and patient to pursue assistance.   Provided patient with number for Imaging at [facility] [phone] to call to try to schedule Dexa and CT scan of her lung (orders are already put in by Dr Domenica and Dr Neda)   Follow Up Plan: 2 to 4 weeks.   Madelin Ray, PharmD Clinical Pharmacist [facility]

## 2023-12-01 NOTE — Telephone Encounter (Signed)
 PAP: Application for Ozempic has been submitted to [employer], via fax

## 2023-12-03 NOTE — Telephone Encounter (Signed)
 PAP: Patient assistance application for Ozempic  has been approved by PAP Companies: [employer] from 12/02/2023 to 03/03/2024. Medication should be delivered to PAP Delivery: Provider's office. For further shipping updates, please contact [employer] at [phone]. Patient ID is: [id number]

## 2023-12-03 NOTE — Progress Notes (Signed)
 Pharmacy Medication Assistance Program Note    12/03/2023  Patient ID: Brenda [name], female   DOB: 1945-08-10, 78 y.o.   MRN: [id number]     12/03/2023  Outreach Medication One  Initial Outreach Date (Medication One) 11/26/2023  Manufacturer Medication One [employer] [facility] Ozempic   Dose of Ozempic  0.25/0.5 MG  Type of Radiographer, therapeutic Assistance  Date Application Sent to Patient 11/26/2023  Date Application Sent to Prescriber 11/26/2023  Name of Prescriber STACEY BLYTH  Application Items Received From Patient Application  Date Application Received From Provider 12/01/2023  Date Application Submitted to Manufacturer 12/01/2023  Patient Assistance Determination Approved  Approval Start Date 12/02/2023  Approval End Date 03/03/2024

## 2023-12-05 ENCOUNTER — Other Ambulatory Visit (HOSPITAL_BASED_OUTPATIENT_CLINIC_OR_DEPARTMENT_OTHER): Payer: Self-pay

## 2023-12-09 NOTE — Telephone Encounter (Signed)
 Faxed Provider's office again for Pap BI Stiolto application review and signature.

## 2023-12-09 NOTE — Telephone Encounter (Signed)
 Received patient portion PAP application BI- stiolto

## 2023-12-10 ENCOUNTER — Telehealth: Payer: Self-pay | Admitting: *Deleted

## 2023-12-10 ENCOUNTER — Telehealth: Payer: Self-pay | Admitting: Pharmacist

## 2023-12-10 ENCOUNTER — Other Ambulatory Visit

## 2023-12-10 NOTE — Telephone Encounter (Signed)
 Ozempic  received from pap and placed in fridge. Pt notified its ready for pickup.

## 2023-12-10 NOTE — Telephone Encounter (Signed)
 Attempt was made to contact patient by phone today for follow up by Clinical Pharmacist regarding medication access and management.  Unable to reach patient. LM on VM with my contact number [phone].

## 2023-12-11 ENCOUNTER — Ambulatory Visit (HOSPITAL_BASED_OUTPATIENT_CLINIC_OR_DEPARTMENT_OTHER)
Admission: RE | Admit: 2023-12-11 | Discharge: 2023-12-11 | Disposition: A | Discharge status: Home / Self Care | Source: Ambulatory Visit | Attending: Pulmonary Disease | Admitting: Pulmonary Disease

## 2023-12-11 DIAGNOSIS — R911 Solitary pulmonary nodule: Secondary | ICD-10-CM | POA: Diagnosis not present

## 2023-12-11 DIAGNOSIS — R918 Other nonspecific abnormal finding of lung field: Secondary | ICD-10-CM | POA: Diagnosis not present

## 2023-12-11 DIAGNOSIS — J439 Emphysema, unspecified: Secondary | ICD-10-CM | POA: Diagnosis not present

## 2023-12-11 DIAGNOSIS — J449 Chronic obstructive pulmonary disease, unspecified: Secondary | ICD-10-CM | POA: Diagnosis not present

## 2023-12-12 NOTE — Telephone Encounter (Signed)
 PAP: Application for Stioloto has been submitted to [employer]), via fax WITH  DOCUMENTS FOR LIS

## 2023-12-17 ENCOUNTER — Telehealth: Payer: Self-pay | Admitting: Pharmacy Technician

## 2023-12-17 NOTE — Progress Notes (Signed)
   12/17/2023 Name: Brenda [name] MRN: [id number] DOB: 1945-08-22  Patient is appearing for a follow-up visit with the population health pharmacy technician. Last engaged with the clinical pharmacist to discuss diabetes, COPD, and medication access on 11/26/2023. Contacted patient today to discuss diabetes, COPD, and medication access.   Plan from last clinical pharmacist appointment:  Diabetes: Currently uncontrolled per last A1c but home blood glucose improving. Also noted to have elevated UACR and low eGFR (G3b / A1 - based on one result 09/11/2023) - Recommend to restart metformin  500mg  twice a day with food.  - Continue Jardiance 25mg  daily which would benefit blood glucose and provide cardiorenal protection.  - Recheck UACR / microalbumin in 3 to 6 months - Will request application for Novo Nordisk / Ozempic  to be sent to patient - will start if approved.   COPD: - Currently uncontrolled.  - Provided education about rescue / albuterol  inhaler versus maintenance / Stiolto  - Meets financial criteria for either Stiolto or Spiriva  patient assistance program through [employer]. Will collaborate with provider, CPhT, and patient to pursue assistance.   Provided patient with number for Imaging at [facility] [phone] to call to try to schedule Dexa and CT scan of her lung (orders are already put in by Dr Domenica and Dr Neda)  Follow Up Plan: 2 to 4 weeks.    CPhT Notes  Called placed to [facility] Cares to check on status of patient's Stiolto application. Spoke to [place]. He informs applications that were sent into BI in October were placed on hold until today 12/17/23. The reason for this per Darilyn is the  re enrollment season for BI starts today and IF approved the patient would be approved for the remainder of 2025 and all of 2026. Elijah suggests calling back in about 1 week for an update on patient's Stiolto application. He does inform that patient is approved for Jardiance thru  03/03/24.  OF note, patient also approved for Ozempic  with Novo Nordisk thru 03/03/24. Patient notified by Edison Bunker, CMA on 12/10/2023 that medication was placed in fridge and patient notified its ready for pickup  Unsuccessful outreach to patient today 12/17/2023. Outreached patient to discuss diabetes medication management. Left voicemail for patient to return my call at their convenience.    Next clinical pharmacist appointment is scheduled for: TBD  Kate Caddy, CPhT [facility] Office: [phone] Email: [email or web address]

## 2023-12-19 ENCOUNTER — Other Ambulatory Visit: Payer: Self-pay | Admitting: Student

## 2023-12-19 ENCOUNTER — Telehealth: Payer: Self-pay | Admitting: Pharmacy Technician

## 2023-12-19 ENCOUNTER — Telehealth: Payer: Self-pay

## 2023-12-19 DIAGNOSIS — E669 Obesity, unspecified: Secondary | ICD-10-CM

## 2023-12-19 NOTE — Telephone Encounter (Signed)
 Copied from CRM [id number]. Topic: Clinical - Medication Question >> Dec 19, 2023 12:26 PM Drema MATSU wrote: Reason for CRM: Patient wants to know the exact doses she is taking for Ozempic . Please call patient.

## 2023-12-19 NOTE — Telephone Encounter (Signed)
 Patient notified to start at 0.25mg  weekly.

## 2023-12-19 NOTE — Progress Notes (Signed)
   12/19/2023  Patient ID: Brenda [name], female   DOB: [birth year], 78 y.o.   MRN: [id number]  Patient engaged with clinical pharmacist for management of diabetes and medication access on 11/26/2023. Outreach by [employer] technician was requested.   Outreached patient to discuss diabetes medication management. Left voicemail for patient to return my call at their convenience.    Mouhamed Glassco, CPhT [facility] Office: [phone] Email: [email or web address]

## 2023-12-22 ENCOUNTER — Other Ambulatory Visit: Payer: Self-pay | Admitting: Family Medicine

## 2023-12-23 ENCOUNTER — Telehealth: Payer: Self-pay | Admitting: Pharmacy Technician

## 2023-12-23 MED ORDER — TRUE METRIX BLOOD GLUCOSE TEST VI STRP: ORAL_STRIP | 2 refills | Status: AC

## 2023-12-23 NOTE — Addendum Note (Signed)
 Addended by: CARLA MILLING B on: 12/23/2023 04:05 PM   Modules accepted: Orders

## 2023-12-23 NOTE — Progress Notes (Addendum)
   12/23/2023 Name: Brenda [name] MRN: [id number] DOB: [birth year]  Patient is appearing for a follow-up visit with the population health pharmacy technician. Last engaged with the clinical pharmacist to discuss diabetes, COPD, and medication access on 11/26/2023. Contacted patient today to discuss diabetes, COPD, and medication access.   Plan from last clinical pharmacist appointment:  Diabetes: Currently uncontrolled per last A1c but home blood glucose improving. Also noted to have elevated UACR and low eGFR (G3b / A1 - based on one result 09/11/2023) - Recommend to restart metformin  500mg  twice a day with food.  - Continue Jardiance 25mg  daily which would benefit blood glucose and provide cardiorenal protection.  - Recheck UACR / microalbumin in 3 to 6 months - Will request application for Novo Nordisk / Ozempic  to be sent to patient - will start if approved.   COPD: - Currently uncontrolled.  - Provided education about rescue / albuterol  inhaler versus maintenance / Stiolto  - Meets financial criteria for either [employer] or Spiriva  patient assistance program through [employer]. Will collaborate with provider, CPhT, and patient to pursue assistance.    Provided patient with number for Imaging at [facility] [phone] to call to try to schedule Dexa and CT scan of her lung (orders are already put in by Dr Domenica and Dr Neda)    Follow Up Plan: 2 to 4 weeks.    Medication Adherence Barriers Identified:  Patient made recommended medication changes per plan: Yes Patient informs she is taking Jardiance 25mg  daily and Ozempic  starting doses once a week on Thursday. She also informs she is taking Metformin  500mg  twice a day Access issues with any new medication or testing device: Yes Stiotlo inhaler and test strips.  Called BI patient assistance program and spoke to [place] who informs the application should be finished processing by end of day. She informs a fax will be sent over with a  determination status.  Patient also informs she needs blood glucose test strips. She informs she has lancets but needs the strips. Patient is already enrolled in BI patient assistance program for [employer] and Novo Nordisk patient assistance program for Ozempic .  Per Dr Annemarie snider history data, Metformin  was last filled on 10/07/2023 for 90 day supply. Patient informs she is tolerating Ozempic  well. She informs she went to a local pharmacy to discuss proper procedure methods and informs she now has a handle on it better. Patient is inquiring about results of CT scan. Patient portion of patient assistance completed: Yes Stiolto with BI. Patient is checking blood sugars as prescribed: Yes Patient informs she is checking blood sugar twice a day. Current blood sugar readings: 134 this morning. She also gave readings of 149 and 154. She did not report any lows less than 70 or hghs greater than 200.   Medication Adherence Barriers Addressed/Actions Taken:  Reviewed medication changes per plan from last clinical pharmacist note Medication Access for Test Strips for glucometer and Stiolto Will discuss medication access concerns with pharmacist Educated patient to contact pharmacy regarding new prescriptions  Reviewed instructions for monitoring blood sugars at home and reminded patient to keep a written log to review with pharmacist Reminded patient of date/time of upcoming clinical pharmacist follow up and any upcoming PCP/specialists visits. Patient denies transportation barriers to the appointment. Yes  Next clinical pharmacist appointment is scheduled for: 01/05/2024  Kate Caddy, CPhT [facility] Office: [phone] Email: [email or web address]

## 2023-12-29 NOTE — Telephone Encounter (Signed)
 PAP: Patient assistance application for Stioloto has been approved by PAP Companies: [employer] from 12/17/2023 to 03/03/2024. Medication should be delivered to PAP Delivery: Home. For further shipping updates, please contact [employer] (BI Cares) at [phone]. Patient ID is: Not provided- spoke to Bi Rep and will ship to patient within 7-10 business days.

## 2023-12-30 DIAGNOSIS — M5416 Radiculopathy, lumbar region: Secondary | ICD-10-CM | POA: Diagnosis not present

## 2023-12-30 DIAGNOSIS — E1122 Type 2 diabetes mellitus with diabetic chronic kidney disease: Secondary | ICD-10-CM | POA: Diagnosis not present

## 2023-12-30 DIAGNOSIS — N183 Chronic kidney disease, stage 3 unspecified: Secondary | ICD-10-CM | POA: Diagnosis not present

## 2023-12-30 DIAGNOSIS — I129 Hypertensive chronic kidney disease with stage 1 through stage 4 chronic kidney disease, or unspecified chronic kidney disease: Secondary | ICD-10-CM | POA: Diagnosis not present

## 2023-12-30 NOTE — Telephone Encounter (Signed)
 PAP: Patient assistance application for Stioloto has been approved by PAP Companies: [employer] from 12/29/2023 to 03/03/2025. Medication should be delivered to PAP Delivery: Home. For further shipping updates, please contact [employer] (BI Cares) at [phone]. Patient ID is: [id number]

## 2024-01-05 ENCOUNTER — Ambulatory Visit (INDEPENDENT_AMBULATORY_CARE_PROVIDER_SITE_OTHER): Admitting: Pharmacist

## 2024-01-05 VITALS — Wt 215.7 lb

## 2024-01-05 DIAGNOSIS — E119 Type 2 diabetes mellitus without complications: Secondary | ICD-10-CM

## 2024-01-05 DIAGNOSIS — N1832 Chronic kidney disease, stage 3b: Secondary | ICD-10-CM | POA: Diagnosis not present

## 2024-01-05 DIAGNOSIS — F419 Anxiety disorder, unspecified: Secondary | ICD-10-CM | POA: Diagnosis not present

## 2024-01-05 DIAGNOSIS — E669 Obesity, unspecified: Secondary | ICD-10-CM | POA: Diagnosis not present

## 2024-01-05 DIAGNOSIS — J449 Chronic obstructive pulmonary disease, unspecified: Secondary | ICD-10-CM | POA: Diagnosis not present

## 2024-01-05 MED ORDER — IRBESARTAN 150 MG PO TABS: 150.0000 mg | ORAL_TABLET | Freq: Every day | ORAL | 0 refills | Status: DC

## 2024-01-05 MED ORDER — ATORVASTATIN CALCIUM 80 MG PO TABS: 80.0000 mg | ORAL_TABLET | Freq: Every day | ORAL | 0 refills | Status: DC

## 2024-01-05 MED ORDER — ESCITALOPRAM OXALATE 20 MG PO TABS: 40.0000 mg | ORAL_TABLET | Freq: Every day | ORAL | 0 refills | Status: DC

## 2024-01-05 MED ORDER — TRIAMTERENE-HCTZ 37.5-25 MG PO TABS: 1.0000 | ORAL_TABLET | Freq: Every day | ORAL | 0 refills | Status: DC

## 2024-01-05 NOTE — Progress Notes (Signed)
 01/05/2024 Name: Brenda [name] MRN: [id number] DOB: [birth year]  Chief Complaint  Patient presents with   Diabetes    Brenda [name] is a 78 y.o. year old female who presented for an in office visit.   They were referred to the pharmacist by their PCP for assistance in managing medication access.    Subjective:  Care Team: Primary Care Provider: [name], MD ; Next Scheduled Visit: 04/02/2023 Cardiologist: Dr Edwyna; Next Scheduled Visit: not currently scheduled  Pulmonologist: Dr Neda; Next Scheduled Visit: 02/18/2024  Medication Access/Adherence  Current Pharmacy:  [facility] - [place], [place] - [address] [place] [address] Phone: [phone] Fax: [phone]  [facility] [address] [place] [address] Phone: [phone] Fax: [phone]   Patient reports affordability concerns with their medications: Yes  - Cost of Stiolto and Spiriva  too high; Dr Domenica has also suggested starting Ozempic    Patient received letter from Social Security that she was ineligible for LIS / Extra Help. Brand meds are $47 / month or $131 / 90 days but she has a $250 deductible and initial cost for branded med would be $297.  Getting Stiolto and Jardiance from [facility] now and Ozempic  from Novo Nordisk thru 03/03/2024  Patient reports access/transportation concerns to their pharmacy: No  Patient reports adherence concerns with their medications:  No      Diabetes:  Current medications:  metformin  500mg  twice a day (increased from once daily - 09/14/2023) Jardiance 25mg  daily - started 1 week ago.  Ozempic  0.25mg  weekly - has taken 2 doses so far. She is unsure if she has been injecting correctly.   Patient noted to have low eGFR and elevated UACR at last check. There is no previous UACR to compare to.  eGFR has been as low as 34.45 (05/28/2023)   Last UACR =  25.3 (09/11/2023)   Lab Results  Component Value Date   NA 137 09/11/2023   CL 102 09/11/2023   K 4.6 09/11/2023   CO2 27 09/11/2023   BUN 29 (H) 09/11/2023   CREATININE 1.20 09/11/2023   GFR 43.54 (L) 09/11/2023   CALCIUM  9.0 09/11/2023   PHOS 2.6 12/20/2013   ALBUMIN 3.8 09/11/2023   GLUCOSE 92 09/11/2023    Wt Readings from Last 3 Encounters:  01/05/24 215 lb 11.2 oz (97.8 kg)  11/19/23 216 lb (98 kg)  11/17/23 214 lb 3.2 oz (97.2 kg)   BP Readings from Last 3 Encounters:  11/19/23 118/78  11/17/23 122/84  10/07/23 128/80    Current glucose readings: 157 this morning. Usually 130's to 160's in the morning.   Patient denies hypoglycemic s/sx including no dizziness, shakiness, sweating. Patient denies hyperglycemic symptoms including no polyuria, polydipsia, polyphagia, nocturia, neuropathy, blurred vision.  Current medication access support: BI Cares and novo Nordisk thru 03/03/2024  COPD: She had her first visit with pulmonologist Dr Neda 09/18/2023  Current medications: has albuterol  inhaler.  She has received Stiolto frm [facility] but has not started because she is unsure how to use the Stiolto inhaler.    Reports 1 exacerbations that require prednisone  and antibiotic course x 2 in the past year  Current medication access support: Stiolto from [facility]    Objective:  Lab Results  Component Value Date   HGBA1C 8.4 (H) 09/11/2023    Lab Results  Component Value Date   CREATININE 1.20  09/11/2023   BUN 29 (H) 09/11/2023   NA 137 09/11/2023   K 4.6 09/11/2023   CL 102 09/11/2023   CO2 27 09/11/2023    Lab Results  Component Value Date   CHOL 144 09/11/2023   HDL 52.50 09/11/2023   LDLCALC 64 09/11/2023   LDLDIRECT 91.0 05/29/2021   TRIG 138.0 09/11/2023   CHOLHDL 3 09/11/2023    Medications Reviewed Today     Reviewed by Carla Milling, RPH-CPP (Pharmacist) on 01/05/24 at 1548  Med List Status: <None>   Medication Order Taking? Sig  Documenting Provider Last Dose Status Informant  acetaminophen  (TYLENOL ) 500 MG tablet [id number] Yes Take 1 tablet (500 mg total) by mouth every 6 (six) hours as needed (pain). Zuleta, Kaitlin G, NP  Active   albuterol  (VENTOLIN  HFA) 108 (90 Base) MCG/ACT inhaler [id number] Yes Inhale 1-2 puffs into the lungs every 4 (four) hours as needed for wheezing or shortness of breath. Yacopino, Jessica L, NP  Active   Alcohol  Swabs  (DROPSAFE ALCOHOL  PREP) 70 % PADS [id number] Yes USE AS DIRECTED ONCE A DAY. [name], MD  Active   aspirin 81 MG tablet [id number] Yes Take 81 mg by mouth daily. [provider]  Active Self  atorvastatin  (LIPITOR) 80 MG tablet [id number]  Take 1 tablet (80 mg total) by mouth daily. [name], MD  Active   blood glucose meter kit and supplies [id number] Yes Dispense based on patient and insurance preference. Use Prn to check blood suagr [name], MD  Active   Blood Glucose Monitoring Suppl (TRUE METRIX AIR GLUCOSE METER) w/Device KIT [id number] Yes USE AS DIRECTED [name], MD  Active   Calcium  Citrate-Vitamin D (CALCIUM  CITRATE + D) 250-5 MG-MCG TABS [id number]  Take 2 tablets by mouth daily at 12 noon. [provider]  Active   cholecalciferol (VITAMIN D3) 25 MCG (1000 UNIT) tablet [id number]  Take 1,000 Units by mouth daily. [provider]  Active   Cyanocobalamin (VITAMIN B 12 PO) [id number]  Take 1,000 Units by mouth daily. [provider]  Active Self  empagliflozin (JARDIANCE) 25 MG TABS tablet [id number] Yes Take 1 tablet (25 mg total) by mouth daily before breakfast. [name], MD  Active   escitalopram  (LEXAPRO ) 20 MG tablet [id number]  Take 2 tablets (40 mg total) by mouth daily. [name], MD  Active   famotidine  (PEPCID ) 40 MG tablet [id number] Yes TAKE 1 TABLET EVERY DAY [name], MD  Active   glucose blood (TRUE METRIX BLOOD GLUCOSE TEST) test strip [id number]  Use to check glucose once a day.  Dx  code: E11.9 [name], MD  Active   irbesartan  (AVAPRO ) 150 MG tablet [id number]  Take 1 tablet (150 mg total) by mouth daily. [name], MD  Active   KRILL OIL OMEGA-3 PO [id number]  Take by mouth daily. [provider]  Active Self  Multiple Vitamins-Minerals (ALIVE WOMENS 50+ PO) [id number]  Take by mouth daily. [provider]  Active Self  OVER THE COUNTER MEDICATION [id number]  Take 1 each by mouth daily. FAIR LIFE protein shake [provider]  Active   Probiotic Product (PROBIOTIC DAILY) CAPS [id number]  Take 1 capsule by mouth daily. [provider]  Active Self  Semaglutide ,0.25 or 0.5MG /DOS, (OZEMPIC , 0.25 OR 0.5 MG/DOSE,) 2 MG/1.5ML SOPN [id number] Yes Inject into the skin. [provider]  Active   Tiotropium Bromide-Olodaterol (STIOLTO RESPIMAT ) 2.5-2.5 MCG/ACT AERS [id number]  Inhale 2 puffs into the lungs daily.  Patient not taking: Reported on 01/05/2024   Neda Jennet LABOR, MD  Active   traMADol  (ULTRAM ) 50 MG tablet [id number]  Take 1 tablet by mouth every 6 (six) hours as needed for moderate pain or severe pain. [provider]  Active   triamterene -hydrochlorothiazide  (MAXZIDE-25) 37.5-25 MG tablet [id number]  Take 1 tablet by mouth daily. [name], MD  Active   TRUEplus Lancets 33G MISC [id number]  TEST BLOOD SUGAR ONE TIME DAILY [name], MD  Active   TURMERIC PO [id number]  Take 1 capsule by mouth daily. [provider]  Active Self              Assessment/Plan:   Diabetes: Currently uncontrolled per last A1c but home blood glucose improving. Also noted to have elevated UACR and low eGFR (G3b / A1 - based on one result 09/11/2023) - Continue metformin  500mg  twice a day with food and Jardiance 25mg  daily.  - Education provided regarding Ozempic  injection / administration. Patient will continue 0.25mg  for 2 more doses and then increase to 0.5mg  weekly  - Recheck UACR / microalbumin in 3 to  6 months - Education provided on how to use her new glucometer but we were unable to    COPD: - Currently uncontrolled.  - Provided education about rescue / albuterol  inhaler versus maintenance / Stiolto  - Provided education about setting up Stiolto inhaler / canister insertion, inhaler prep or proper inhalation of Stiolto.    Medication Adherence:  - Reviewed recent refill history. She will be due to fill several medications later in November and will need updated prescriptions. Rx's sent to [facility] at patient request.   Meds ordered this encounter  Medications   irbesartan  (AVAPRO ) 150 MG tablet    Sig: Take 1 tablet (150 mg total) by mouth daily.    Dispense:  90 tablet    Refill:  0   escitalopram  (LEXAPRO ) 20 MG tablet    Sig: Take 2 tablets (40 mg total) by mouth daily.    Dispense:  180 tablet    Refill:  0   triamterene -hydrochlorothiazide  (MAXZIDE-25) 37.5-25 MG tablet    Sig: Take 1 tablet by mouth daily.    Dispense:  90 tablet    Refill:  0   atorvastatin  (LIPITOR) 80 MG tablet    Sig: Take 1 tablet (80 mg total) by mouth daily.    Dispense:  90 tablet    Refill:  0     Follow Up Plan: 2 to 4 weeks.   Madelin Ray, PharmD Clinical Pharmacist [facility]

## 2024-01-08 ENCOUNTER — Other Ambulatory Visit: Payer: Self-pay | Admitting: Family Medicine

## 2024-01-12 ENCOUNTER — Ambulatory Visit: Payer: Self-pay

## 2024-01-12 ENCOUNTER — Telehealth: Payer: Self-pay | Admitting: *Deleted

## 2024-01-12 NOTE — Telephone Encounter (Signed)
 Pt has an appointment with Tammy on 01/26/24.  Provider Domenica Blackbird - MD Contact Type Call Who Is Calling Patient / Member / Family / Caregiver Call Type Triage / Clinical Relationship To Patient Self Return Phone Number [phone] (Primary) Chief Complaint Medication reaction Reason for Call Symptomatic / Request for Health Information Initial Comment Caller states she is on her 3rd dose of Ozempic . She is also taking Metformin  and Jardiance 25mg . When she takes the Ozempic  she feels very tired and her hands get numbed. Her glucose reading this morning was 103 before breakfast and 129 after breakfast. She is using a new and an old glucose meter. She would like to know if that is too low. Translation No Nurse Assessment Nurse: Laurier, RN, Amber Date/Time (Eastern Time): 01/10/2024 3:33:01 PM Confirm and document reason for call. If symptomatic, describe symptoms. ---Caller states she is on her 3rd dose of Ozempic . She is also taking Metformin  and Jardiance 25mg .Caller reports the Ozempic  makes her feel tired. Her glucose reading this morning was 103 before breakfast and 129 after breakfast on her new meter. He glucose on her old meter was 121 before breakfast and 125 after breakfast. Caller is worried about the meters reading differently. Caller reports she has no sxs right now after she ate but this morning she felt tired and slept in.  Final Disposition 01/10/2024 3:52:36 PM See HCP (or PCP Triage) Within 4 Hours Yes Sims, CHARITY FUNDRAISER, [facility]

## 2024-01-12 NOTE — Telephone Encounter (Signed)
 Appt scheduled

## 2024-01-12 NOTE — Telephone Encounter (Signed)
 FYI Only or Action Required?: FYI only for provider: appointment scheduled on 01/13/2024.  Patient was last seen in primary care on 11/17/2023 by [name], MD.  Called Nurse Triage reporting Numbness.  Symptoms began about a month ago.  Interventions attempted: Nothing.  Symptoms are: unchanged.  Triage Disposition: See HCP Within 4 Hours (Or PCP Triage)  Patient/caregiver understands and will follow disposition?: Yes  Copied from CRM [id number]. Topic: Clinical - Red Word Triage >> Jan 12, 2024 11:41 AM Burnard DEL wrote: Red Word that prompted transfer to Nurse Triage: numbness in right hand and right leg and foot Reason for Disposition  [1] Tingling (e.g., pins and needles) of the face, arm / hand, or leg / foot on one side of the body AND [2] present now  (Exceptions: Chronic/recurrent symptom lasting > 4 weeks; or from known cause, such as: bumped elbow, carpal tunnel, pinched nerve.)  Answer Assessment - Initial Assessment Questions 1. SYMPTOM: What is the main symptom you are concerned about? (e.g., weakness, numbness)     tingling to right hand, right leg, and right foot 2. ONSET: When did this start? (e.g., minutes, hours, days; while sleeping)     After taking ozempic  injection. First injection given on 12/25/2023 and the tingling began then.  Tingling resolves between injections 3. LAST NORMAL: When was the last time you (the patient) were normal (no symptoms)?     Before she started ozempic  on 12/25/2023 4. PATTERN Does this come and go, or has it been constant since it started?  Is it present now?     Tingling occurs after ozempic  injection 5. CARDIAC SYMPTOMS: Have you had any of the following symptoms: chest pain, difficulty breathing, palpitations?     denies 6. NEUROLOGIC SYMPTOMS: Have you had any of the following symptoms: headache, dizziness, vision loss, double vision, changes in speech, unsteady on your feet?     denies  Protocols used:  Neurologic Deficit-A-AH

## 2024-01-13 ENCOUNTER — Encounter: Payer: Self-pay | Admitting: Family Medicine

## 2024-01-13 ENCOUNTER — Ambulatory Visit: Admitting: Family Medicine

## 2024-01-13 VITALS — BP 118/72 | HR 104 | Resp 20 | Ht 66.0 in | Wt 213.6 lb

## 2024-01-13 DIAGNOSIS — R200 Anesthesia of skin: Secondary | ICD-10-CM | POA: Diagnosis not present

## 2024-01-13 DIAGNOSIS — E782 Mixed hyperlipidemia: Secondary | ICD-10-CM

## 2024-01-13 DIAGNOSIS — E669 Obesity, unspecified: Secondary | ICD-10-CM | POA: Diagnosis not present

## 2024-01-13 DIAGNOSIS — Z7985 Long-term (current) use of injectable non-insulin antidiabetic drugs: Secondary | ICD-10-CM | POA: Diagnosis not present

## 2024-01-13 DIAGNOSIS — K219 Gastro-esophageal reflux disease without esophagitis: Secondary | ICD-10-CM | POA: Diagnosis not present

## 2024-01-13 DIAGNOSIS — E119 Type 2 diabetes mellitus without complications: Secondary | ICD-10-CM

## 2024-01-13 DIAGNOSIS — J449 Chronic obstructive pulmonary disease, unspecified: Secondary | ICD-10-CM | POA: Diagnosis not present

## 2024-01-13 DIAGNOSIS — R202 Paresthesia of skin: Secondary | ICD-10-CM | POA: Diagnosis not present

## 2024-01-13 NOTE — Assessment & Plan Note (Signed)
 Stop ozempic   F/u pharm D 1-2 weeks  or sooner as needed

## 2024-01-13 NOTE — Telephone Encounter (Signed)
 Pt has appointment today with Dr. Cruz

## 2024-01-13 NOTE — Assessment & Plan Note (Signed)
 Stop ozempic  and f/u with pharm D in 1-2 weeks or return to office if symptoms worsen

## 2024-01-13 NOTE — Progress Notes (Signed)
 Subjective:    Patient ID: Brenda [name], female    DOB: [birth year], 78 y.o.   MRN: [id number]  Chief Complaint  Patient presents with   Tingling    Right Hand, legs, and toes    HPI Patient is in today for tingling in feet and hands since starting ozempic .   Discussed the use of AI scribe software for clinical note transcription with the patient, who gave verbal consent to proceed.  History of Present Illness Brenda [name] is a 78 year old female with diabetes who presents with side effects from Ozempic .  She experiences significant drowsiness and headaches after taking Ozempic , which she started recently. These symptoms occur on Thursdays, the day she administers the medication, and persist for a couple of days. She describes the drowsiness as a 'deep sleep' from which she does not want to wake up. The symptoms have progressively worsened, leading to discomfort with continuing the medication.  Additionally, she has noticed numbness and tingling in her hands, legs, and feet since starting Ozempic . This numbness is concerning to her and she associates it with the medication.  She is currently on metformin  500 mg twice daily and Jardiance 25 mg. Her blood sugar readings have been around 151 mg/dL, with slight variations between two different glucometers. She checks her blood sugar twice daily.  She recently received a new glucometer from [facility] and a new inhaler, which has improved her breathing.   Past Medical History:  Diagnosis Date   Acute cough 08/21/2023   Anxiety 10/17/2010   Arthritis 11/25/2011   Carpal tunnel syndrome on right 01/20/2012   Cataracts, bilateral 11/12/2023   [facility]   Cervical cancer screening 04/26/2013   Menarche at 14 regular  Menopause surgical at 37  G1P1 s/p SVD  Partial hysterectomy for fibroids with bladder tack  T[place]al  No h/o abnormal pap, last pap prior to hysterectomy  Mgm UTD, no concerns     Chronic obstructive pulmonary disease  (HCC) 08/07/2023   Colon cancer screening 04/17/2011   Coronary artery calcification seen on CAT scan    cardiologist--- dr edwyna   DDD (degenerative disc disease), lumbar    Depression    DOE (dyspnea on exertion)    Dry skin 05/28/2023   Evaluate for OSA (obstructive sleep apnea) 02/14/2014   Feeling grief 09/30/2018   GERD (gastroesophageal reflux disease)    History of chicken pox 08/24/2012   childhood age 40     History of colon polyps 01/31/2021   History of obstructive sleep apnea    (04-29-2022  per pt was retested , no issue)  study in epic 05-01-2014 by dr burnard [facility] 16.9 / hr w/ nocturnal oxygen saturation   History of uterine fibroid    Hyperlipidemia 04/26/2012   Hyperlipidemia, mixed    2011   Hypertension    Left hip pain 03/19/2016   Left knee pain 01/05/2019   Lower back injury, initial encounter 04/02/2016   Lumbar disc disease with radiculopathy 01/20/2012   LVH (left ventricular hypertrophy) 01/30/2021   Medicare annual wellness visit, subsequent 12/25/2014   Muscle spasm 12/25/2014   Numerous moles 05/09/2015   OA (osteoarthritis)    Obesity 10/17/2010   Osteoarthritis of left glenohumeral joint 12/05/2015   Osteopenia 03/19/2016   Oxygen desaturation during sleep 05/24/2014   Pneumonia of right middle lobe due to infectious organism 08/26/2023   Preventative health care 03/24/2016   Prolapse of anterior vaginal wall    Prolapse of  vaginal vault after hysterectomy    Pulmonary nodule 08/07/2023   Renal insufficiency 09/11/2023   Right foot pain 08/07/2023   Seasonal allergies    SOB (shortness of breath) 08/21/2023   SUI (stress urinary incontinence, female) 08/24/2012   Type 2 diabetes mellitus with obesity 01/31/2013   IMO SNOMED Dx Update Oct 2024     Urinary incontinence 08/24/2012   Vitreomacular adhesion 11/12/2023   Dr Renate    Past Surgical History:  Procedure Laterality Date   ANTERIOR AND POSTERIOR REPAIR WITH SACROSPINOUS  FIXATION N/A 05/30/2022   Procedure: ANTERIOR  REPAIR WITH SACROSPINOUS FIXATION;  Surgeon: Marilynne Rosaline SAILOR, MD;  Location: [facility];  Service: Gynecology;  Laterality: N/A;   BLADDER SUSPENSION N/A 05/30/2022   Procedure: TRANSVAGINAL TAPE (TVT) PROCEDURE;  Surgeon: Marilynne Rosaline SAILOR, MD;  Location: [facility];  Service: Gynecology;  Laterality: N/A;   COLONOSCOPY  2018   CYSTOSCOPY N/A 05/30/2022   Procedure: CYSTOSCOPY;  Surgeon: Marilynne Rosaline SAILOR, MD;  Location: [facility];  Service: Gynecology;  Laterality: N/A;   PERINEOPLASTY  05/30/2022   Procedure: PERINEOPLASTY;  Surgeon: Marilynne Rosaline SAILOR, MD;  Location: [facility];  Service: Gynecology;;   TOTAL VAGINAL HYSTERECTOMY  2004   @[facility] ;   W/   BILATERAL SALPINOOPHORECTOMY AND SLING PROCEDURE    Family History  Problem Relation Age of Onset   Dementia Mother    Heart disease Father 22   Hypertension Father    Colon cancer Sister 4   Dementia Sister    Breast cancer Sister 55   Thyroid  disease Sister    Depression Sister        anxiety   Obesity Daughter    Cancer Paternal Grandfather        bone    Social History   Socioeconomic History   Marital status: Divorced    Spouse name: Not on file   Number of children: Not on file   Years of education: Not on file   Highest education level: Not on file  Occupational History   Not on file  Tobacco Use   Smoking status: Former    Current packs/day: 0.00    Types: Cigarettes    Start date: 66    Quit date: 1975    Years since quitting: 50.8   Smokeless tobacco: Never  Vaping Use   Vaping status: Never Used  Substance and Sexual Activity   Alcohol  use: Not Currently    Comment: very rare   Drug use: Never   Sexual activity: Yes  Other Topics Concern   Not on file  Social History Narrative   Not on file   Social Drivers of Health   Financial Resource Strain: Medium Risk (11/06/2023)    Overall Financial Resource Strain (CARDIA)    Difficulty of Paying Living Expenses: Somewhat hard  Food Insecurity: No Food Insecurity (11/06/2023)   Hunger Vital Sign    Worried About Running Out of Food in the Last Year: Never true    Ran Out of Food in the Last Year: Never true  Transportation Needs: No Transportation Needs (11/06/2023)   PRAPARE - Administrator, Civil Service (Medical): No    Lack of Transportation (Non-Medical): No  Physical Activity: Insufficiently Active (11/06/2023)   Exercise Vital Sign    Days of Exercise per Week: 1 day    Minutes of Exercise per Session: 60 min  Stress: No Stress Concern Present (11/06/2023)  [employer] of Occupational Health - Occupational Stress Questionnaire    Feeling of Stress: Only a little  Social Connections: Moderately Integrated (11/06/2023)   Social Connection and Isolation Panel    Frequency of Communication with Friends and Family: Three times a week    Frequency of Social Gatherings with Friends and Family: Once a week    Attends Religious Services: 1 to 4 times per year    Active Member of [employer] or Organizations: No    Attends Engineer, Structural: More than 4 times per year    Marital Status: Widowed  Intimate Partner Violence: Not At Risk (11/06/2023)   Humiliation, Afraid, Rape, and Kick questionnaire    Fear of Current or Ex-Partner: No    Emotionally Abused: No    Physically Abused: No    Sexually Abused: No    Outpatient Medications Prior to Visit  Medication Sig Dispense Refill   acetaminophen  (TYLENOL ) 500 MG tablet Take 1 tablet (500 mg total) by mouth every 6 (six) hours as needed (pain). 30 tablet 0   albuterol  (VENTOLIN  HFA) 108 (90 Base) MCG/ACT inhaler Inhale 1-2 puffs into the lungs every 4 (four) hours as needed for wheezing or shortness of breath. 6.7 g 2   Alcohol  Swabs  (DROPSAFE ALCOHOL  PREP) 70 % PADS USE AS DIRECTED ONCE A DAY. 100 each 3   aspirin 81 MG tablet Take 81 mg by  mouth daily.     atorvastatin  (LIPITOR) 80 MG tablet Take 1 tablet (80 mg total) by mouth daily. 90 tablet 0   blood glucose meter kit and supplies Dispense based on patient and insurance preference. Use Prn to check blood suagr 1 each 1   Blood Glucose Monitoring Suppl (TRUE METRIX AIR GLUCOSE METER) w/Device KIT USE AS DIRECTED 1 kit 3   Calcium  Citrate-Vitamin D (CALCIUM  CITRATE + D) 250-5 MG-MCG TABS Take 2 tablets by mouth daily at 12 noon.     cholecalciferol (VITAMIN D3) 25 MCG (1000 UNIT) tablet Take 1,000 Units by mouth daily.     Cyanocobalamin (VITAMIN B 12 PO) Take 1,000 Units by mouth daily.     empagliflozin (JARDIANCE) 25 MG TABS tablet Take 1 tablet (25 mg total) by mouth daily before breakfast.     escitalopram  (LEXAPRO ) 20 MG tablet Take 2 tablets (40 mg total) by mouth daily. 180 tablet 0   famotidine  (PEPCID ) 40 MG tablet Take 1 tablet (40 mg total) by mouth daily. 90 tablet 1   glucose blood (TRUE METRIX BLOOD GLUCOSE TEST) test strip Use to check glucose once a day.  Dx code: E11.9 100 each 2   irbesartan  (AVAPRO ) 150 MG tablet Take 1 tablet (150 mg total) by mouth daily. 90 tablet 0   KRILL OIL OMEGA-3 PO Take by mouth daily.     Multiple Vitamins-Minerals (ALIVE WOMENS 50+ PO) Take by mouth daily.     OVER THE COUNTER MEDICATION Take 1 each by mouth daily. FAIR LIFE protein shake     Probiotic Product (PROBIOTIC DAILY) CAPS Take 1 capsule by mouth daily.     Semaglutide ,0.25 or 0.5MG /DOS, (OZEMPIC , 0.25 OR 0.5 MG/DOSE,) 2 MG/1.5ML SOPN Inject into the skin.     Tiotropium Bromide-Olodaterol (STIOLTO RESPIMAT ) 2.5-2.5 MCG/ACT AERS Inhale 2 puffs into the lungs daily. (Patient not taking: Reported on 01/05/2024) 4 g 6   traMADol  (ULTRAM ) 50 MG tablet Take 1 tablet by mouth every 6 (six) hours as needed for moderate pain or severe pain.     triamterene -hydrochlorothiazide  (  MAXZIDE-25) 37.5-25 MG tablet Take 1 tablet by mouth daily. 90 tablet 0   TRUEplus Lancets 33G MISC TEST  BLOOD SUGAR ONE TIME DAILY 100 each 3   TURMERIC PO Take 1 capsule by mouth daily.     No facility-administered medications prior to visit.    Allergies  Allergen Reactions   Meloxicam  Nausea Only and Nausea And Vomiting    Review of Systems  Constitutional:  Negative for fever and malaise/fatigue.  HENT:  Negative for congestion.   Eyes:  Negative for blurred vision.  Respiratory:  Negative for cough and shortness of breath.   Cardiovascular:  Negative for chest pain, palpitations and leg swelling.  Gastrointestinal:  Negative for vomiting.  Musculoskeletal:  Negative for back pain.  Skin:  Negative for rash.  Neurological:  Positive for tingling and sensory change. Negative for loss of consciousness and headaches.       Objective:    Physical Exam Vitals and nursing note reviewed.  Constitutional:      General: She is not in acute distress.    Appearance: Normal appearance. She is well-developed.  HENT:     Head: Normocephalic and atraumatic.  Eyes:     General: No scleral icterus.       Right eye: No discharge.        Left eye: No discharge.  Cardiovascular:     Rate and Rhythm: Normal rate and regular rhythm.     Heart sounds: No murmur heard. Pulmonary:     Effort: Pulmonary effort is normal. No respiratory distress.     Breath sounds: Normal breath sounds.  Musculoskeletal:        General: Normal range of motion.     Cervical back: Normal range of motion and neck supple.     Right lower leg: No edema.     Left lower leg: No edema.  Skin:    General: Skin is warm and dry.  Neurological:     Mental Status: She is alert and oriented to person, place, and time.  Psychiatric:        Mood and Affect: Mood normal.        Behavior: Behavior normal.        Thought Content: Thought content normal.        Judgment: Judgment normal.     BP 118/72   Pulse (!) 104   Resp 20   Ht 5' 6 (1.676 m)   Wt 213 lb 9.6 oz (96.9 kg)   SpO2 98%   BMI 34.48 kg/m  Wt  Readings from Last 3 Encounters:  01/13/24 213 lb 9.6 oz (96.9 kg)  01/05/24 215 lb 11.2 oz (97.8 kg)  11/19/23 216 lb (98 kg)    Diabetic Foot Exam - Simple   No data filed    Lab Results  Component Value Date   WBC 4.3 09/11/2023   HGB 12.9 09/11/2023   HCT 38.8 09/11/2023   PLT 437.0 (H) 09/11/2023   GLUCOSE 92 09/11/2023   CHOL 144 09/11/2023   TRIG 138.0 09/11/2023   HDL 52.50 09/11/2023   LDLDIRECT 91.0 05/29/2021   LDLCALC 64 09/11/2023   ALT 11 09/11/2023   AST 12 09/11/2023   NA 137 09/11/2023   K 4.6 09/11/2023   CL 102 09/11/2023   CREATININE 1.20 09/11/2023   BUN 29 (H) 09/11/2023   CO2 27 09/11/2023   TSH 1.19 09/11/2023   HGBA1C 8.4 (H) 09/11/2023   MICROALBUR 2.2 (H) 09/11/2023  Lab Results  Component Value Date   TSH 1.19 09/11/2023   Lab Results  Component Value Date   WBC 4.3 09/11/2023   HGB 12.9 09/11/2023   HCT 38.8 09/11/2023   MCV 88.0 09/11/2023   PLT 437.0 (H) 09/11/2023   Lab Results  Component Value Date   NA 137 09/11/2023   K 4.6 09/11/2023   CO2 27 09/11/2023   GLUCOSE 92 09/11/2023   BUN 29 (H) 09/11/2023   CREATININE 1.20 09/11/2023   BILITOT 0.4 09/11/2023   ALKPHOS 84 09/11/2023   AST 12 09/11/2023   ALT 11 09/11/2023   PROT 7.2 09/11/2023   ALBUMIN 3.8 09/11/2023   CALCIUM  9.0 09/11/2023   ANIONGAP 12 03/18/2023   GFR 43.54 (L) 09/11/2023   Lab Results  Component Value Date   CHOL 144 09/11/2023   Lab Results  Component Value Date   HDL 52.50 09/11/2023   Lab Results  Component Value Date   LDLCALC 64 09/11/2023   Lab Results  Component Value Date   TRIG 138.0 09/11/2023   Lab Results  Component Value Date   CHOLHDL 3 09/11/2023   Lab Results  Component Value Date   HGBA1C 8.4 (H) 09/11/2023       Assessment & Plan:  Chronic obstructive pulmonary disease, unspecified COPD type (HCC) Assessment & Plan: Improved since last visit    Gastroesophageal reflux disease without esophagitis -      TSH -     CBC with Differential/Platelet  Hyperlipidemia, mixed -     TSH -     Lipid panel -     Comprehensive metabolic panel with GFR  Type 2 diabetes mellitus in patient with obesity (HCC) -     TSH -     Hemoglobin A1c  Morbid (severe) obesity due to excess calories (HCC) Assessment & Plan: Pt not able to tolerate ozempic      Numbness and tingling in both hands Assessment & Plan: Stop ozempic   F/u pharm D 1-2 weeks  or sooner as needed    Numbness and tingling of both feet Assessment & Plan: Stop ozempic  and f/u with pharm D in 1-2 weeks or return to office if symptoms worsen     Assessment and Plan Assessment & Plan Adverse effects from Ozempic  (semaglutide )   She is experiencing adverse effects from Ozempic , including deep sleep, headache, drowsiness, and numbness in her hands, legs, and feet. These symptoms began after starting Ozempic  and are concerning enough to warrant discontinuation. Symptoms may improve within a couple of weeks after stopping the medication. Ozempic  has been discontinued. Monitor symptoms for improvement over the next few weeks. If symptoms do not improve or worsen, contact the clinic.  Type 2 diabetes mellitus   Her type 2 diabetes is currently managed with metformin  and Jardiance. Blood sugar levels are being monitored with a new glucometer, showing readings around 151 mg/dL. No recent lab work has been done to assess current diabetes control. Continue metformin  and Jardiance. Lab work has been ordered to assess current diabetes control. Follow up with Tammy on November 24th for further management.   Keiri Solano R Lowne Chase, DO

## 2024-01-13 NOTE — Assessment & Plan Note (Signed)
 Pt not able to tolerate ozempic 

## 2024-01-13 NOTE — Assessment & Plan Note (Signed)
 Improved since last visit

## 2024-01-14 ENCOUNTER — Ambulatory Visit: Payer: Self-pay | Admitting: Family Medicine

## 2024-01-14 LAB — COMPREHENSIVE METABOLIC PANEL WITH GFR
ALT: 11 U/L (ref 0–35)
AST: 14 U/L (ref 0–37)
Albumin: 4 g/dL (ref 3.5–5.2)
Alkaline Phosphatase: 53 U/L (ref 39–117)
BUN: 26 mg/dL — ABNORMAL HIGH (ref 6–23)
CO2: 26 meq/L (ref 19–32)
Calcium: 9.2 mg/dL (ref 8.4–10.5)
Chloride: 102 meq/L (ref 96–112)
Creatinine, Ser: 1 mg/dL (ref 0.40–1.20)
GFR: 54.06 mL/min — ABNORMAL LOW (ref >60.00)
Glucose, Bld: 115 mg/dL — ABNORMAL HIGH (ref 70–99)
Potassium: 3.9 meq/L (ref 3.5–5.1)
Sodium: 137 meq/L (ref 135–145)
Total Bilirubin: 0.4 mg/dL (ref 0.2–1.2)
Total Protein: 7.1 g/dL (ref 6.0–8.3)

## 2024-01-14 LAB — CBC WITH DIFFERENTIAL/PLATELET
Basophils Absolute: 0 K/uL (ref 0.0–0.1)
Basophils Relative: 0.5 % (ref 0.0–3.0)
Eosinophils Absolute: 0.2 K/uL (ref 0.0–0.7)
Eosinophils Relative: 3.7 % (ref 0.0–5.0)
HCT: 43.7 % (ref 36.0–46.0)
Hemoglobin: 14.7 g/dL (ref 12.0–15.0)
Lymphocytes Relative: 51.9 % — ABNORMAL HIGH (ref 12.0–46.0)
Lymphs Abs: 2.5 K/uL (ref 0.7–4.0)
MCHC: 33.6 g/dL (ref 30.0–36.0)
MCV: 89.2 fl (ref 78.0–100.0)
Monocytes Absolute: 0.3 K/uL (ref 0.1–1.0)
Monocytes Relative: 7.1 % (ref 3.0–12.0)
Neutro Abs: 1.7 K/uL (ref 1.4–7.7)
Neutrophils Relative %: 36.8 % — ABNORMAL LOW (ref 43.0–77.0)
Platelets: 314 K/uL (ref 150.0–400.0)
RBC: 4.9 Mil/uL (ref 3.87–5.11)
RDW: 14.5 % (ref 11.5–15.5)
WBC: 4.7 K/uL (ref 4.0–10.5)

## 2024-01-14 LAB — LIPID PANEL
Cholesterol: 126 mg/dL (ref 0–200)
HDL: 47 mg/dL (ref >39.00)
LDL Cholesterol: 61 mg/dL (ref 0–99)
NonHDL: 79.39
Total CHOL/HDL Ratio: 3
Triglycerides: 90 mg/dL (ref 0.0–149.0)
VLDL: 18 mg/dL (ref 0.0–40.0)

## 2024-01-14 LAB — HEMOGLOBIN A1C: Hgb A1c MFr Bld: 6.8 % — ABNORMAL HIGH (ref 4.6–6.5)

## 2024-01-14 LAB — TSH: TSH: 1.77 u[IU]/mL (ref 0.35–5.50)

## 2024-01-15 NOTE — Progress Notes (Signed)
 Called pt and no answer left message to return.

## 2024-01-20 ENCOUNTER — Other Ambulatory Visit: Payer: Self-pay | Admitting: Family Medicine

## 2024-01-20 ENCOUNTER — Telehealth: Payer: Self-pay

## 2024-01-20 MED ORDER — TIRZEPATIDE 2.5 MG/0.5ML ~~LOC~~ SOAJ: 2.5000 mg | SUBCUTANEOUS | 1 refills | Status: DC

## 2024-01-20 NOTE — Telephone Encounter (Signed)
 Patient was advised that medication will have to be send into the pharmacy and her insurance will let the pharmacy know if a PA is required or she has a copay.

## 2024-01-20 NOTE — Telephone Encounter (Signed)
 Copied from CRM [id number]. Topic: General - Other >> Jan 20, 2024 11:41 AM Rosina BIRCH wrote: Reason for RMF:[id number] called stating the provider is going to order her a diabetic medication and she want to know if it was free [phone]

## 2024-01-22 ENCOUNTER — Other Ambulatory Visit (HOSPITAL_BASED_OUTPATIENT_CLINIC_OR_DEPARTMENT_OTHER)

## 2024-01-26 ENCOUNTER — Other Ambulatory Visit: Admitting: Pharmacist

## 2024-01-26 DIAGNOSIS — E669 Obesity, unspecified: Secondary | ICD-10-CM

## 2024-01-26 NOTE — Progress Notes (Signed)
 01/26/2024 Name: Brenda [name] MRN: [id number] DOB: [birth year]  No chief complaint on file.   Brenda [name] is a 78 y.o. year old female who presented for an in office visit.   They were referred to the pharmacist by their PCP for assistance in managing medication access.    Subjective:  Care Team: Primary Care Provider: [name], MD ; Next Scheduled Visit: 04/02/2023 Cardiologist: Dr Edwyna; Next Scheduled Visit: not currently scheduled  Pulmonologist: Dr Neda; Next Scheduled Visit: 02/18/2024  Medication Access/Adherence  Current Pharmacy:  [facility] - [place], [place] - [address] [place] [address] Phone: [phone] Fax: [phone]  [facility] [address], [address] [place] [address] Phone: [phone] Fax: [phone]   Patient reports affordability concerns with their medications: Yes  - She is enrolled in [employer] for Jardiance and Stiolto; Enrolled in [employer] for Ozempic  but Ozempic  has been discontinued.  We applied for LIS but she was ineligible for LIS / Extra Help. Brand meds are $47 / month or $131 / 90 days but she has a $250 deductible and initial cost for branded med would be $297.  Getting Stiolto and Jardiance from [facility] now and Ozempic  from [employer] thru 03/03/2024  Patient reports access/transportation concerns to their pharmacy: No  Patient reports adherence concerns with their medications:  No      Diabetes:  Current medications:  metformin  500mg  twice a day (increased from once daily - 09/14/2023) Jardiance 25mg  daily  She has been prescribed Mounjaro  but has not received yet. It was sent to [facility] for 56 DS. Called [facility] and her copay for 56 day supply that was sent in was $316.11 asked to lower to 28 days supply and cost was $269.11 (she has a $250 deductible to meet and then $47 copay)   Previous  medications tried - Ozempic  - stopped due to suspected it was causing numbness and stiffness.  Home blood glucose: 130's mostly - lowest 125; highest 170   Macrovascular and Microvascular Risk Reduction:  Statin? yes (atorvastatin  ); ACEi/ARB? yes (irbesartan ) Last urinary albumin/creatinine ratio:  Lab Results  Component Value Date   MICRALBCREAT 25.3 09/11/2023   Last eye exam:  Lab Results  Component Value Date   HMDIABEYEEXA No Retinopathy 11/12/2023   Last foot exam: 05/28/2023 Tobacco Use:  Tobacco Use: Medium Risk (01/13/2024)   Patient History    Smoking Tobacco Use: Former    Smokeless Tobacco Use: Never    Passive Exposure: Not on file      Lab Results  Component Value Date   NA 137 01/13/2024   CL 102 01/13/2024   K 3.9 01/13/2024   CO2 26 01/13/2024   BUN 26 (H) 01/13/2024   CREATININE 1.00 01/13/2024   GFR 54.06 (L) 01/13/2024   CALCIUM  9.2 01/13/2024   PHOS 2.6 12/20/2013   ALBUMIN 4.0 01/13/2024   GLUCOSE 115 (H) 01/13/2024    Wt Readings from Last 3 Encounters:  01/13/24 213 lb 9.6 oz (96.9 kg)  01/05/24 215 lb 11.2 oz (97.8 kg)  11/19/23 216 lb (98 kg)   BP Readings from Last 3 Encounters:  01/13/24 118/72  11/19/23 118/78  11/17/23 122/84    Current glucose readings: 157 this morning. Usually 130's to 160's in the morning.   Patient denies hypoglycemic s/sx including no dizziness, shakiness, sweating. Patient denies hyperglycemic symptoms including no polyuria, polydipsia,  polyphagia, nocturia, neuropathy, blurred vision.  Current medication access support: BI Cares and novo Nordisk thru 03/03/2024  COPD: She had her first visit with pulmonologist Dr Neda 09/18/2023  Current medications: has albuterol  inhaler. Stiolto inhaler  Reports 1 exacerbations that require prednisone  and antibiotic course x 2 in the past year  Current medication access support: Stiolto from [facility]    Objective:  Lab Results  Component Value Date    HGBA1C 6.8 (H) 01/13/2024    Lab Results  Component Value Date   CREATININE 1.00 01/13/2024   BUN 26 (H) 01/13/2024   NA 137 01/13/2024   K 3.9 01/13/2024   CL 102 01/13/2024   CO2 26 01/13/2024    Lab Results  Component Value Date   CHOL 126 01/13/2024   HDL 47.00 01/13/2024   LDLCALC 61 01/13/2024   LDLDIRECT 91.0 05/29/2021   TRIG 90.0 01/13/2024   CHOLHDL 3 01/13/2024    Medications Reviewed Today     Reviewed by Carla Milling, RPH-CPP (Pharmacist) on 01/26/24 at 1438  Med List Status: <None>   Medication Order Taking? Sig Documenting Provider Last Dose Status Informant  acetaminophen  (TYLENOL ) 500 MG tablet [id number]  Take 1 tablet (500 mg total) by mouth every 6 (six) hours as needed (pain). Zuleta, Kaitlin G, NP  Active   albuterol  (VENTOLIN  HFA) 108 (90 Base) MCG/ACT inhaler [id number] Yes Inhale 1-2 puffs into the lungs every 4 (four) hours as needed for wheezing or shortness of breath. Yacopino, Jessica L, NP  Active   Alcohol  Swabs  (DROPSAFE ALCOHOL  PREP) 70 % PADS [id number]  USE AS DIRECTED ONCE A DAY. [name], MD  Active   aspirin 81 MG tablet [id number] Yes Take 81 mg by mouth daily. [provider]  Active Self  atorvastatin  (LIPITOR) 80 MG tablet [id number] Yes Take 1 tablet (80 mg total) by mouth daily. [name], MD  Active   blood glucose meter kit and supplies [id number]  Dispense based on patient and insurance preference. Use Prn to check blood suagr [name], MD  Active   Blood Glucose Monitoring Suppl (TRUE METRIX AIR GLUCOSE METER) w/Device KIT [id number]  USE AS DIRECTED [name], MD  Active   Calcium  Citrate-Vitamin D (CALCIUM  CITRATE + D) 250-5 MG-MCG TABS [id number] Yes Take 2 tablets by mouth daily at 12 noon. [provider]  Active   cholecalciferol (VITAMIN D3) 25 MCG (1000 UNIT) tablet [id number] Yes Take 1,000 Units by mouth daily. [provider]  Active   Cyanocobalamin (VITAMIN B 12 PO)  [id number] Yes Take 1,000 Units by mouth daily. [provider]  Active Self  empagliflozin (JARDIANCE) 25 MG TABS tablet [id number] Yes Take 1 tablet (25 mg total) by mouth daily before breakfast. [name], MD  Active   escitalopram  (LEXAPRO ) 20 MG tablet [id number] Yes Take 2 tablets (40 mg total) by mouth daily. [name], MD  Active   famotidine  (PEPCID ) 40 MG tablet [id number] Yes Take 1 tablet (40 mg total) by mouth daily. [name], MD  Active   glucose blood (TRUE METRIX BLOOD GLUCOSE TEST) test strip [id number]  Use to check glucose once a day.  Dx code: E11.9 [name], MD  Active   irbesartan  (AVAPRO ) 150 MG tablet [id number] Yes Take 1 tablet (150 mg total) by mouth daily. [name], MD  Active   KRILL OIL OMEGA-3 PO [id number]  Take by mouth daily. [provider]  Active Self  metFORMIN  (GLUCOPHAGE ) 500 MG tablet [id number] Yes Take 500 mg by mouth 2 (two) times daily with a meal. [provider]  Active   Multiple Vitamins-Minerals (ALIVE WOMENS 50+ PO) [id number]  Take by mouth daily. [provider]  Active Self  OVER THE COUNTER MEDICATION [id number]  Take 1 each by mouth daily. FAIR LIFE protein shake [provider]  Active   Probiotic Product (PROBIOTIC DAILY) CAPS [id number]  Take 1 capsule by mouth daily. [provider]  Active Self   Patient not taking:   Discontinued 01/26/24 1410 (Side effect (s))   Tiotropium Bromide-Olodaterol (STIOLTO RESPIMAT ) 2.5-2.5 MCG/ACT AERS [id number] Yes Inhale 2 puffs into the lungs daily. Neda Jennet LABOR, MD  Active   tirzepatide  (MOUNJARO ) 2.5 MG/0.5ML Pen [id number]  Inject 2.5 mg into the skin once a week.  Patient not taking: Reported on 01/26/2024   [name], MD  Active   traMADol  (ULTRAM ) 50 MG tablet [id number]  Take 1 tablet by mouth every 6 (six) hours as needed for moderate pain or severe pain. [provider]  Active    triamterene -hydrochlorothiazide  (MAXZIDE-25) 37.5-25 MG tablet [id number] Yes Take 1 tablet by mouth daily. [name], MD  Active   TRUEplus Lancets 33G MISC [id number]  TEST BLOOD SUGAR ONE TIME DAILY [name], MD  Active   TURMERIC PO [id number]  Take 1 capsule by mouth daily. [provider]  Active Self              Assessment/Plan:   Diabetes: Currently controlled per last A1c but home blood glucose improving. UACR and eGFR have improved.  - Continue metformin  500mg  twice a day with food and Jardiance 25mg  daily.  Called [employer] - Bernadine was shipped 01/22/2024 and should arrive in the next 2 to 5 days.  - Patient states cost of Mounjaro  is too much. Cost in 2026 would be the same. Discussed pharmacy payment plan - she would pay $175 in January and then about $30 per month thereafter is she used payment plan. She is considering.    Follow Up Plan: 2 to 4 weeks.   Madelin Ray, PharmD Clinical Pharmacist [facility]

## 2024-02-18 ENCOUNTER — Ambulatory Visit: Admitting: Pulmonary Disease

## 2024-02-18 ENCOUNTER — Encounter: Payer: Self-pay | Admitting: Pulmonary Disease

## 2024-02-18 VITALS — BP 120/79 | HR 88 | Temp 97.4°F | Ht 67.0 in | Wt 209.6 lb

## 2024-02-18 DIAGNOSIS — E119 Type 2 diabetes mellitus without complications: Secondary | ICD-10-CM | POA: Diagnosis not present

## 2024-02-18 DIAGNOSIS — I519 Heart disease, unspecified: Secondary | ICD-10-CM

## 2024-02-18 DIAGNOSIS — R918 Other nonspecific abnormal finding of lung field: Secondary | ICD-10-CM | POA: Diagnosis not present

## 2024-02-18 DIAGNOSIS — I10 Essential (primary) hypertension: Secondary | ICD-10-CM | POA: Diagnosis not present

## 2024-02-18 DIAGNOSIS — Z7984 Long term (current) use of oral hypoglycemic drugs: Secondary | ICD-10-CM

## 2024-02-18 DIAGNOSIS — J432 Centrilobular emphysema: Secondary | ICD-10-CM | POA: Diagnosis not present

## 2024-02-18 NOTE — Patient Instructions (Signed)
 I will see you back in about 6 months  Continue using Stiolto  Stiolto should be used 2 puffs daily  You can watch a couple of videos online about to use Stiolto correctly  Continue graded exercises  Continue weight loss efforts  Call us  with any significant concerns

## 2024-02-18 NOTE — Progress Notes (Signed)
 Brenda [name]    [id number]    08-16-1945  Primary Care Physician:Blyth, Harlene LABOR, MD  Referring Physician: [name], MD [address] [place],  [place] [address]  Chief complaint:   In for follow-up for COPD  HPI:  Symptoms have been better since using Stiolto  Cough is better, minimal sputum production  She has lost about 5 pounds  She is on Jardiance and this seems to be helping symptoms  Quit smoking over 40 years ago History of diabetes, hypertension  Rarely needs albuterol   She continues to use Stiolto  Denies significant shortness of breath at rest Does struggle with back pain and discomfort Plans to get back in the pool for exercise  Moderate to significant exertion gets short of breath   Outpatient Encounter Medications as of 02/18/2024  Medication Sig   acetaminophen  (TYLENOL ) 500 MG tablet Take 1 tablet (500 mg total) by mouth every 6 (six) hours as needed (pain).   albuterol  (VENTOLIN  HFA) 108 (90 Base) MCG/ACT inhaler Inhale 1-2 puffs into the lungs every 4 (four) hours as needed for wheezing or shortness of breath.   Alcohol  Swabs  (DROPSAFE ALCOHOL  PREP) 70 % PADS USE AS DIRECTED ONCE A DAY.   aspirin 81 MG tablet Take 81 mg by mouth daily.   atorvastatin  (LIPITOR) 80 MG tablet Take 1 tablet (80 mg total) by mouth daily.   blood glucose meter kit and supplies Dispense based on patient and insurance preference. Use Prn to check blood suagr   Blood Glucose Monitoring Suppl (TRUE METRIX AIR GLUCOSE METER) w/Device KIT USE AS DIRECTED   Calcium  Citrate-Vitamin D (CALCIUM  CITRATE + D) 250-5 MG-MCG TABS Take 2 tablets by mouth daily at 12 noon.   cholecalciferol (VITAMIN D3) 25 MCG (1000 UNIT) tablet Take 1,000 Units by mouth daily.   Cyanocobalamin (VITAMIN B 12 PO) Take 1,000 Units by mouth daily.   empagliflozin (JARDIANCE) 25 MG TABS tablet Take 1 tablet (25 mg total) by mouth daily before breakfast.   escitalopram  (LEXAPRO )  20 MG tablet Take 2 tablets (40 mg total) by mouth daily.   famotidine  (PEPCID ) 40 MG tablet Take 1 tablet (40 mg total) by mouth daily.   glucose blood (TRUE METRIX BLOOD GLUCOSE TEST) test strip Use to check glucose once a day.  Dx code: E11.9   irbesartan  (AVAPRO ) 150 MG tablet Take 1 tablet (150 mg total) by mouth daily.   KRILL OIL OMEGA-3 PO Take by mouth daily.   metFORMIN  (GLUCOPHAGE ) 500 MG tablet Take 500 mg by mouth 2 (two) times daily with a meal.   Multiple Vitamins-Minerals (ALIVE WOMENS 50+ PO) Take by mouth daily.   OVER THE COUNTER MEDICATION Take 1 each by mouth daily. FAIR LIFE protein shake   Probiotic Product (PROBIOTIC DAILY) CAPS Take 1 capsule by mouth daily.   Tiotropium Bromide-Olodaterol (STIOLTO RESPIMAT ) 2.5-2.5 MCG/ACT AERS Inhale 2 puffs into the lungs daily.   traMADol  (ULTRAM ) 50 MG tablet Take 1 tablet by mouth every 6 (six) hours as needed for moderate pain or severe pain.   triamterene -hydrochlorothiazide  (MAXZIDE-25) 37.5-25 MG tablet Take 1 tablet by mouth daily.   TRUEplus Lancets 33G MISC TEST BLOOD SUGAR ONE TIME DAILY   TURMERIC PO Take 1 capsule by mouth daily.   No facility-administered encounter medications on file as of 02/18/2024.    Allergies as of 02/18/2024 - Review Complete 02/18/2024  Allergen Reaction Noted   Meloxicam  Nausea  Only and Nausea And Vomiting 01/20/2012    Past Medical History:  Diagnosis Date   Acute cough 08/21/2023   Anxiety 10/17/2010   Arthritis 11/25/2011   Carpal tunnel syndrome on right 01/20/2012   Cataracts, bilateral 11/12/2023   [facility]   Cervical cancer screening 04/26/2013   Menarche at 14 regular  Menopause surgical at 37  G1P1 s/p SVD  Partial hysterectomy for fibroids with bladder tack  Tubal  No h/o abnormal pap, last pap prior to hysterectomy  Mgm UTD, no concerns     Chronic obstructive pulmonary disease (HCC) 08/07/2023   Colon cancer screening 04/17/2011   Coronary artery calcification  seen on CAT scan    cardiologist--- dr edwyna   DDD (degenerative disc disease), lumbar    Depression    DOE (dyspnea on exertion)    Dry skin 05/28/2023   Evaluate for OSA (obstructive sleep apnea) 02/14/2014   Feeling grief 09/30/2018   GERD (gastroesophageal reflux disease)    History of chicken pox 08/24/2012   childhood age 48     History of colon polyps 01/31/2021   History of obstructive sleep apnea    (04-29-2022  per pt was retested , no issue)  study in epic 05-01-2014 by dr burnard [facility] 16.9 / hr w/ nocturnal oxygen saturation   History of uterine fibroid    Hyperlipidemia 04/26/2012   Hyperlipidemia, mixed    2011   Hypertension    Left hip pain 03/19/2016   Left knee pain 01/05/2019   Lower back injury, initial encounter 04/02/2016   Lumbar disc disease with radiculopathy 01/20/2012   LVH (left ventricular hypertrophy) 01/30/2021   Medicare annual wellness visit, subsequent 12/25/2014   Muscle spasm 12/25/2014   Numerous moles 05/09/2015   OA (osteoarthritis)    Obesity 10/17/2010   Osteoarthritis of left glenohumeral joint 12/05/2015   Osteopenia 03/19/2016   Oxygen desaturation during sleep 05/24/2014   Pneumonia of right middle lobe due to infectious organism 08/26/2023   Preventative health care 03/24/2016   Prolapse of anterior vaginal wall    Prolapse of vaginal vault after hysterectomy    Pulmonary nodule 08/07/2023   Renal insufficiency 09/11/2023   Right foot pain 08/07/2023   Seasonal allergies    SOB (shortness of breath) 08/21/2023   SUI (stress urinary incontinence, female) 08/24/2012   Type 2 diabetes mellitus with obesity 01/31/2013   IMO SNOMED Dx Update Oct 2024     Urinary incontinence 08/24/2012   Vitreomacular adhesion 11/12/2023   Dr Renate    Past Surgical History:  Procedure Laterality Date   ANTERIOR AND POSTERIOR REPAIR WITH SACROSPINOUS FIXATION N/A 05/30/2022   Procedure: ANTERIOR  REPAIR WITH SACROSPINOUS FIXATION;  Surgeon:  Marilynne Rosaline SAILOR, MD;  Location: [facility];  Service: Gynecology;  Laterality: N/A;   BLADDER SUSPENSION N/A 05/30/2022   Procedure: TRANSVAGINAL TAPE (TVT) PROCEDURE;  Surgeon: Marilynne Rosaline SAILOR, MD;  Location: [facility];  Service: Gynecology;  Laterality: N/A;   COLONOSCOPY  2018   CYSTOSCOPY N/A 05/30/2022   Procedure: CYSTOSCOPY;  Surgeon: Marilynne Rosaline SAILOR, MD;  Location: [facility];  Service: Gynecology;  Laterality: N/A;   PERINEOPLASTY  05/30/2022   Procedure: PERINEOPLASTY;  Surgeon: Marilynne Rosaline SAILOR, MD;  Location: [facility];  Service: Gynecology;;   TOTAL VAGINAL HYSTERECTOMY  2004   @[facility] ;   W/   BILATERAL SALPINOOPHORECTOMY AND SLING PROCEDURE    Family History  Problem Relation Age of Onset  Dementia Mother    Heart disease Father 79   Hypertension Father    Colon cancer Sister 14   Dementia Sister    Breast cancer Sister 64   Thyroid  disease Sister    Depression Sister        anxiety   Obesity Daughter    Cancer Paternal Grandfather        bone    Social History   Socioeconomic History   Marital status: Divorced    Spouse name: Not on file   Number of children: Not on file   Years of education: Not on file   Highest education level: Not on file  Occupational History   Not on file  Tobacco Use   Smoking status: Former    Current packs/day: 0.00    Types: Cigarettes    Start date: 66    Quit date: 1975    Years since quitting: 50.9   Smokeless tobacco: Never  Vaping Use   Vaping status: Never Used  Substance and Sexual Activity   Alcohol  use: Not Currently    Comment: very rare   Drug use: Never   Sexual activity: Yes  Other Topics Concern   Not on file  Social History Narrative   Not on file   Social Drivers of Health   Tobacco Use: Medium Risk (02/18/2024)   Patient History    Smoking Tobacco Use: Former    Smokeless Tobacco Use: Never    Passive  Exposure: Not on file  Financial Resource Strain: Medium Risk (11/06/2023)   Overall Financial Resource Strain (CARDIA)    Difficulty of Paying Living Expenses: Somewhat hard  Food Insecurity: No Food Insecurity (11/06/2023)   Epic    Worried About Radiation Protection Practitioner of Food in the Last Year: Never true    Ran Out of Food in the Last Year: Never true  Transportation Needs: No Transportation Needs (11/06/2023)   Epic    Lack of Transportation (Medical): No    Lack of Transportation (Non-Medical): No  Physical Activity: Insufficiently Active (11/06/2023)   Exercise Vital Sign    Days of Exercise per Week: 1 day    Minutes of Exercise per Session: 60 min  Stress: No Stress Concern Present (11/06/2023)   [employer] of Occupational Health - Occupational Stress Questionnaire    Feeling of Stress: Only a little  Social Connections: Moderately Integrated (11/06/2023)   Social Connection and Isolation Panel    Frequency of Communication with Friends and Family: Three times a week    Frequency of Social Gatherings with Friends and Family: Once a week    Attends Religious Services: 1 to 4 times per year    Active Member of [employer] or Organizations: No    Attends Engineer, Structural: More than 4 times per year    Marital Status: Widowed  Intimate Partner Violence: Not At Risk (11/06/2023)   Epic    Fear of Current or Ex-Partner: No    Emotionally Abused: No    Physically Abused: No    Sexually Abused: No  Depression (PHQ2-9): Low Risk (11/17/2023)   Depression (PHQ2-9)    PHQ-2 Score: 2  Alcohol  Screen: Low Risk (11/06/2023)   Alcohol  Screen    Last Alcohol  Screening Score (AUDIT): 1  Housing: Low Risk (11/06/2023)   Epic    Unable to Pay for Housing in the Last Year: No    Number of Times Moved in the Last Year: 0    Homeless in the Last  Year: No  Utilities: Not At Risk (11/06/2023)   Epic    Threatened with loss of utilities: No  Health Literacy: Adequate Health Literacy (11/06/2023)   B1300  Health Literacy    Frequency of need for help with medical instructions: Never    Review of Systems  Respiratory:  Negative for cough and shortness of breath.    Vitals:   02/18/24 1116  BP: (!) 150/76  Pulse: 88  Temp: (!) 97.4 F (36.3 C)  SpO2: 97%   Physical Exam Constitutional:      Appearance: She is obese.  HENT:     Head: Normocephalic.     Mouth/Throat:     Mouth: Mucous membranes are moist.  Eyes:     General: No scleral icterus. Cardiovascular:     Rate and Rhythm: Normal rate and regular rhythm.     Heart sounds: No murmur heard.    No friction rub.  Pulmonary:     Effort: No respiratory distress.     Breath sounds: No stridor. No wheezing or rhonchi.  Musculoskeletal:     Cervical back: No rigidity or tenderness.  Neurological:     General: No focal deficit present.     Mental Status: She is alert.  Psychiatric:        Mood and Affect: Mood normal.     Data Reviewed: Echocardiogram 03/06/2022 with diastolic dysfunction, normal ejection fraction  CT chest 09/11/2023 reviewed - Stable from prior Extensive emphysema - Multiple nodularities - CT October 2025 reviewed by myself showing significant improvement in findings  PFT with mild obstructive disease with no significant bronchodilator response   Assessment:   She is doing better Multiple nodules - Plan to repeat CT at some point  Continue Stiolto for chronic obstructive pulmonary disease  Extensive emphysema  Diastolic dysfunction - Continue risk factor modification - On Jardiance  Overall symptoms are better  Chronic musculoskeletal pain and discomfort - Stable  Type 2 diabetes - Stable  Hypertension - Blood pressure little high today during the visit   Plan/Recommendations: Continue Stiolto  Continue weight loss efforts  Graded exercise as tolerated  Follow-up in about 6 months  Encouraged to call with significant concerns  Inhaler technique was gone over during the  visit  Jennet Epley MD [facility] 02/18/2024, 11:29 AM  CC: [name], MD

## 2024-02-20 ENCOUNTER — Other Ambulatory Visit (HOSPITAL_BASED_OUTPATIENT_CLINIC_OR_DEPARTMENT_OTHER): Payer: Self-pay | Admitting: Family Medicine

## 2024-02-20 DIAGNOSIS — Z1231 Encounter for screening mammogram for malignant neoplasm of breast: Secondary | ICD-10-CM

## 2024-02-24 ENCOUNTER — Telehealth: Payer: Self-pay

## 2024-02-24 NOTE — Telephone Encounter (Signed)
 Patient is approved for 2026 renewal with Bi for Jardiance.

## 2024-02-24 NOTE — Telephone Encounter (Signed)
 PAP: Patient assistance application for Jardiance through [employer]) has been mailed to pt's home address on file. Provider portion of application will be faxed to provider's office.

## 2024-03-01 ENCOUNTER — Other Ambulatory Visit

## 2024-03-01 ENCOUNTER — Telehealth: Payer: Self-pay | Admitting: Pharmacist

## 2024-03-01 NOTE — Telephone Encounter (Signed)
 Attempt was made to contact patient by phone today for follow up by Clinical Pharmacist regarding medication cost and diabetes.  Unable to reach patient. LM on VM with my contact number [phone].

## 2024-03-19 ENCOUNTER — Other Ambulatory Visit: Payer: Self-pay | Admitting: Family Medicine

## 2024-03-19 DIAGNOSIS — F419 Anxiety disorder, unspecified: Secondary | ICD-10-CM

## 2024-03-29 ENCOUNTER — Ambulatory Visit (HOSPITAL_BASED_OUTPATIENT_CLINIC_OR_DEPARTMENT_OTHER)

## 2024-03-29 ENCOUNTER — Inpatient Hospital Stay (HOSPITAL_BASED_OUTPATIENT_CLINIC_OR_DEPARTMENT_OTHER): Admission: RE | Admit: 2024-03-29 | Source: Ambulatory Visit

## 2024-03-31 NOTE — Assessment & Plan Note (Signed)
 Hydrate and monitor

## 2024-03-31 NOTE — Assessment & Plan Note (Signed)
 Taking  Metformin  500 mg po qam, minimize simple carbs. Increase exercise as tolerated. Continue current meds.

## 2024-03-31 NOTE — Progress Notes (Unsigned)
 "  Subjective:    Patient ID: Brenda [name], female    DOB: 27-Jan-1946, 79 y.o.   MRN: [id number]  No chief complaint on file.   HPI Discussed the use of AI scribe software for clinical note transcription with the patient, who gave verbal consent to proceed.  History of Present Illness     Past Medical History:  Diagnosis Date   Acute cough 08/21/2023   Anxiety 10/17/2010   Arthritis 11/25/2011   Carpal tunnel syndrome on right 01/20/2012   Cataracts, bilateral 11/12/2023   [facility]   Cervical cancer screening 04/26/2013   Menarche at 14 regular  Menopause surgical at 55  G1P1 s/p SVD  Partial hysterectomy for fibroids with bladder tack  Tubal  No h/o abnormal pap, last pap prior to hysterectomy  Mgm UTD, no concerns     Chronic obstructive pulmonary disease (HCC) 08/07/2023   Colon cancer screening 04/17/2011   Coronary artery calcification seen on CAT scan    cardiologist--- dr edwyna   DDD (degenerative disc disease), lumbar    Depression    DOE (dyspnea on exertion)    Dry skin 05/28/2023   Evaluate for OSA (obstructive sleep apnea) 02/14/2014   Feeling grief 09/30/2018   GERD (gastroesophageal reflux disease)    History of chicken pox 08/24/2012   childhood age 64     History of colon polyps 01/31/2021   History of obstructive sleep apnea    (04-29-2022  per pt was retested , no issue)  study in epic 05-01-2014 by dr burnard [facility] 16.9 / hr w/ nocturnal oxygen saturation   History of uterine fibroid    Hyperlipidemia 04/26/2012   Hyperlipidemia, mixed    2011   Hypertension    Left hip pain 03/19/2016   Left knee pain 01/05/2019   Lower back injury, initial encounter 04/02/2016   Lumbar disc disease with radiculopathy 01/20/2012   LVH (left ventricular hypertrophy) 01/30/2021   Medicare annual wellness visit, subsequent 12/25/2014   Muscle spasm 12/25/2014   Numerous moles 05/09/2015   OA (osteoarthritis)    Obesity  10/17/2010   Osteoarthritis of left glenohumeral joint 12/05/2015   Osteopenia 03/19/2016   Oxygen desaturation during sleep 05/24/2014   Pneumonia of right middle lobe due to infectious organism 08/26/2023   Preventative health care 03/24/2016   Prolapse of anterior vaginal wall    Prolapse of vaginal vault after hysterectomy    Pulmonary nodule 08/07/2023   Renal insufficiency 09/11/2023   Right foot pain 08/07/2023   Seasonal allergies    SOB (shortness of breath) 08/21/2023   SUI (stress urinary incontinence, female) 08/24/2012   Type 2 diabetes mellitus with obesity 01/31/2013   IMO SNOMED Dx Update Oct 2024     Urinary incontinence 08/24/2012   Vitreomacular adhesion 11/12/2023   Dr Renate    Past Surgical History:  Procedure Laterality Date   ANTERIOR AND POSTERIOR REPAIR WITH SACROSPINOUS FIXATION N/A 05/30/2022   Procedure: ANTERIOR  REPAIR WITH SACROSPINOUS FIXATION;  Surgeon: Marilynne Rosaline SAILOR, MD;  Location: [facility];  Service: Gynecology;  Laterality: N/A;   BLADDER SUSPENSION N/A 05/30/2022   Procedure: TRANSVAGINAL TAPE (TVT) PROCEDURE;  Surgeon: Marilynne Rosaline SAILOR, MD;  Location: [facility];  Service: Gynecology;  Laterality: N/A;   COLONOSCOPY  2018   CYSTOSCOPY N/A 05/30/2022   Procedure: CYSTOSCOPY;  Surgeon: Marilynne Rosaline SAILOR, MD;  Location: [facility];  Service: Gynecology;  Laterality: N/A;   PERINEOPLASTY  05/30/2022   Procedure: PERINEOPLASTY;  Surgeon: Marilynne Rosaline SAILOR, MD;  Location: [facility];  Service: Gynecology;;   TOTAL VAGINAL HYSTERECTOMY  2004   @[facility] ;   W/   BILATERAL SALPINOOPHORECTOMY AND SLING PROCEDURE    Family History  Problem Relation Age of Onset   Dementia Mother    Heart disease Father 21   Hypertension Father    Colon cancer Sister 75   Dementia Sister    Breast cancer Sister 53   Thyroid  disease Sister    Depression  Sister        anxiety   Obesity Daughter    Cancer Paternal Grandfather        bone    Social History   Socioeconomic History   Marital status: Divorced    Spouse name: Not on file   Number of children: Not on file   Years of education: Not on file   Highest education level: Not on file  Occupational History   Not on file  Tobacco Use   Smoking status: Former    Current packs/day: 0.00    Types: Cigarettes    Start date: 64    Quit date: 1975    Years since quitting: 51.1   Smokeless tobacco: Never  Vaping Use   Vaping status: Never Used  Substance and Sexual Activity   Alcohol  use: Not Currently    Comment: very rare   Drug use: Never   Sexual activity: Yes  Other Topics Concern   Not on file  Social History Narrative   Not on file   Social Drivers of Health   Tobacco Use: Medium Risk (02/18/2024)   Patient History    Smoking Tobacco Use: Former    Smokeless Tobacco Use: Never    Passive Exposure: Not on file  Financial Resource Strain: Medium Risk (11/06/2023)   Overall Financial Resource Strain (CARDIA)    Difficulty of Paying Living Expenses: Somewhat hard  Food Insecurity: No Food Insecurity (11/06/2023)   Epic    Worried About Radiation Protection Practitioner of Food in the Last Year: Never true    Ran Out of Food in the Last Year: Never true  Transportation Needs: No Transportation Needs (11/06/2023)   Epic    Lack of Transportation (Medical): No    Lack of Transportation (Non-Medical): No  Physical Activity: Insufficiently Active (11/06/2023)   Exercise Vital Sign    Days of Exercise per Week: 1 day    Minutes of Exercise per Session: 60 min  Stress: No Stress Concern Present (11/06/2023)   [employer] of Occupational Health - Occupational Stress Questionnaire    Feeling of Stress: Only a little  Social Connections: Moderately Integrated (11/06/2023)   Social Connection and Isolation Panel    Frequency of Communication with Friends and Family:  Three times a week    Frequency of Social Gatherings with Friends and Family: Once a week    Attends Religious Services: 1 to 4 times per year    Active Member of [employer] or Organizations: No    Attends Engineer, Structural: More than 4 times per year    Marital Status: Widowed  Intimate Partner Violence: Not At Risk (11/06/2023)   Epic    Fear of Current or Ex-Partner: No    Emotionally Abused: No    Physically Abused: No    Sexually Abused: No  Depression (PHQ2-9): Low Risk (11/17/2023)   Depression (PHQ2-9)    PHQ-2 Score: 2  Alcohol  Screen: Low Risk (  11/06/2023)   Alcohol  Screen    Last Alcohol  Screening Score (AUDIT): 1  Housing: Low Risk (11/06/2023)   Epic    Unable to Pay for Housing in the Last Year: No    Number of Times Moved in the Last Year: 0    Homeless in the Last Year: No  Utilities: Not At Risk (11/06/2023)   Epic    Threatened with loss of utilities: No  Health Literacy: Adequate Health Literacy (11/06/2023)   B1300 Health Literacy    Frequency of need for help with medical instructions: Never    Outpatient Medications Prior to Visit  Medication Sig Dispense Refill   atorvastatin  (LIPITOR) 80 MG tablet Take 1 tablet (80 mg total) by mouth daily. 90 tablet 1   escitalopram  (LEXAPRO ) 20 MG tablet Take 2 tablets (40 mg total) by mouth daily. 180 tablet 1   irbesartan  (AVAPRO ) 150 MG tablet Take 1 tablet (150 mg total) by mouth daily. 90 tablet 1   triamterene -hydrochlorothiazide  (MAXZIDE-25) 37.5-25 MG tablet Take 1 tablet by mouth daily. 90 tablet 1   acetaminophen  (TYLENOL ) 500 MG tablet Take 1 tablet (500 mg total) by mouth every 6 (six) hours as needed (pain). 30 tablet 0   albuterol  (VENTOLIN  HFA) 108 (90 Base) MCG/ACT inhaler Inhale 1-2 puffs into the lungs every 4 (four) hours as needed for wheezing or shortness of breath. 6.7 g 2   Alcohol  Swabs  (DROPSAFE ALCOHOL  PREP) 70 % PADS USE AS DIRECTED ONCE A DAY. 100 each 3   aspirin 81 MG  tablet Take 81 mg by mouth daily.     blood glucose meter kit and supplies Dispense based on patient and insurance preference. Use Prn to check blood suagr 1 each 1   Blood Glucose Monitoring Suppl (TRUE METRIX AIR GLUCOSE METER) w/Device KIT USE AS DIRECTED 1 kit 3   Calcium  Citrate-Vitamin D (CALCIUM  CITRATE + D) 250-5 MG-MCG TABS Take 2 tablets by mouth daily at 12 noon.     cholecalciferol (VITAMIN D3) 25 MCG (1000 UNIT) tablet Take 1,000 Units by mouth daily.     Cyanocobalamin (VITAMIN B 12 PO) Take 1,000 Units by mouth daily.     empagliflozin (JARDIANCE) 25 MG TABS tablet Take 1 tablet (25 mg total) by mouth daily before breakfast.     famotidine  (PEPCID ) 40 MG tablet Take 1 tablet (40 mg total) by mouth daily. 90 tablet 1   glucose blood (TRUE METRIX BLOOD GLUCOSE TEST) test strip Use to check glucose once a day.  Dx code: E11.9 100 each 2   KRILL OIL OMEGA-3 PO Take by mouth daily.     metFORMIN  (GLUCOPHAGE ) 500 MG tablet Take 500 mg by mouth 2 (two) times daily with a meal.     Multiple Vitamins-Minerals (ALIVE WOMENS 50+ PO) Take by mouth daily.     OVER THE COUNTER MEDICATION Take 1 each by mouth daily. FAIR LIFE protein shake     Probiotic Product (PROBIOTIC DAILY) CAPS Take 1 capsule by mouth daily.     Tiotropium Bromide-Olodaterol (STIOLTO RESPIMAT ) 2.5-2.5 MCG/ACT AERS Inhale 2 puffs into the lungs daily. 4 g 6   traMADol  (ULTRAM ) 50 MG tablet Take 1 tablet by mouth every 6 (six) hours as needed for moderate pain or severe pain.     TRUEplus Lancets 33G MISC TEST BLOOD SUGAR ONE TIME DAILY 100 each 3   TURMERIC PO Take 1 capsule by mouth daily.     No facility-administered medications prior to visit.  Allergies[1]  Review of Systems  Constitutional:  Negative for fever and malaise/fatigue.  HENT:  Negative for congestion.   Eyes:  Negative for blurred vision.  Respiratory:  Negative for shortness of breath.   Cardiovascular:  Negative for chest pain,  palpitations and leg swelling.  Gastrointestinal:  Negative for abdominal pain, blood in stool and nausea.  Genitourinary:  Negative for dysuria and frequency.  Musculoskeletal:  Negative for falls.  Skin:  Negative for rash.  Neurological:  Negative for dizziness, loss of consciousness and headaches.  Endo/Heme/Allergies:  Negative for environmental allergies.  Psychiatric/Behavioral:  Negative for depression. The patient is not nervous/anxious.        Objective:    Physical Exam Constitutional:      General: She is not in acute distress.    Appearance: Normal appearance. She is well-developed. She is not toxic-appearing.  HENT:     Head: Normocephalic and atraumatic.     Right Ear: External ear normal.     Left Ear: External ear normal.     Nose: Nose normal.  Eyes:     General:        Right eye: No discharge.        Left eye: No discharge.     Conjunctiva/sclera: Conjunctivae normal.  Neck:     Thyroid : No thyromegaly.  Cardiovascular:     Rate and Rhythm: Normal rate and regular rhythm.     Heart sounds: Normal heart sounds. No murmur heard. Pulmonary:     Effort: Pulmonary effort is normal. No respiratory distress.     Breath sounds: Normal breath sounds.  Abdominal:     General: Bowel sounds are normal.     Palpations: Abdomen is soft.     Tenderness: There is no abdominal tenderness. There is no guarding.  Musculoskeletal:        General: Normal range of motion.     Cervical back: Neck supple.  Lymphadenopathy:     Cervical: No cervical adenopathy.  Skin:    General: Skin is warm and dry.  Neurological:     Mental Status: She is alert and oriented to person, place, and time.  Psychiatric:        Mood and Affect: Mood normal.        Behavior: Behavior normal.        Thought Content: Thought content normal.        Judgment: Judgment normal.    There were no vitals taken for this visit. Wt Readings from Last 3 Encounters:  02/18/24 209 lb 9.6 oz (95.1 kg)   01/13/24 213 lb 9.6 oz (96.9 kg)  01/05/24 215 lb 11.2 oz (97.8 kg)    Diabetic Foot Exam - Simple   No data filed    Lab Results  Component Value Date   WBC 4.7 01/13/2024   HGB 14.7 01/13/2024   HCT 43.7 01/13/2024   PLT 314.0 01/13/2024   GLUCOSE 115 (H) 01/13/2024   CHOL 126 01/13/2024   TRIG 90.0 01/13/2024   HDL 47.00 01/13/2024   LDLDIRECT 91.0 05/29/2021   LDLCALC 61 01/13/2024   ALT 11 01/13/2024   AST 14 01/13/2024   NA 137 01/13/2024   K 3.9 01/13/2024   CL 102 01/13/2024   CREATININE 1.00 01/13/2024   BUN 26 (H) 01/13/2024   CO2 26 01/13/2024   TSH 1.77 01/13/2024   HGBA1C 6.8 (H) 01/13/2024   MICROALBUR 2.2 (H) 09/11/2023    Lab Results  Component Value Date  TSH 1.77 01/13/2024   Lab Results  Component Value Date   WBC 4.7 01/13/2024   HGB 14.7 01/13/2024   HCT 43.7 01/13/2024   MCV 89.2 01/13/2024   PLT 314.0 01/13/2024   Lab Results  Component Value Date   NA 137 01/13/2024   K 3.9 01/13/2024   CO2 26 01/13/2024   GLUCOSE 115 (H) 01/13/2024   BUN 26 (H) 01/13/2024   CREATININE 1.00 01/13/2024   BILITOT 0.4 01/13/2024   ALKPHOS 53 01/13/2024   AST 14 01/13/2024   ALT 11 01/13/2024   PROT 7.1 01/13/2024   ALBUMIN 4.0 01/13/2024   CALCIUM  9.2 01/13/2024   ANIONGAP 12 03/18/2023   GFR 54.06 (L) 01/13/2024   Lab Results  Component Value Date   CHOL 126 01/13/2024   Lab Results  Component Value Date   HDL 47.00 01/13/2024   Lab Results  Component Value Date   LDLCALC 61 01/13/2024   Lab Results  Component Value Date   TRIG 90.0 01/13/2024   Lab Results  Component Value Date   CHOLHDL 3 01/13/2024   Lab Results  Component Value Date   HGBA1C 6.8 (H) 01/13/2024       Assessment & Plan:  Type 2 diabetes mellitus in patient with obesity (HCC) Assessment & Plan: Taking  Metformin  500 mg po qam, minimize simple carbs. Increase exercise as tolerated. Continue current meds.   Renal insufficiency Assessment &  Plan: Hydrate and monitor    Obesity (BMI 30.0-34.9) Assessment & Plan: Encouraged DASH or MIND diet, decrease po intake and increase exercise as tolerated. Needs 7-8 hours of sleep nightly. Avoid trans fats, eat small, frequent meals every 4-5 hours with lean proteins, complex carbs and healthy fats. Minimize simple carbs, high fat foods and processed foods    Muscle spasm Assessment & Plan: Hydrate and monitor    Chronic obstructive pulmonary disease, unspecified COPD type (HCC) Assessment & Plan: No recent exacerbations     Assessment and Plan Assessment & Plan      Harlene Horton, MD     [1] Allergies Allergen Reactions   Meloxicam  Nausea Only and Nausea And Vomiting  "

## 2024-03-31 NOTE — Assessment & Plan Note (Signed)
 No recent exacerbations.

## 2024-03-31 NOTE — Assessment & Plan Note (Signed)
 Her BMI today is 46. Encouraged ongoing weight-loss efforts, as even modest reductions can significantly improve overall health. Encouraged DASH or MIND diet, decrease po intake and increase exercise as tolerated. Needs 7-8 hours of sleep nightly. Avoid trans fats, eat small, frequent meals every 4-5 hours with lean proteins, complex carbs and healthy fats. Minimize simple carbs, high fat foods and processed foods

## 2024-04-01 ENCOUNTER — Ambulatory Visit: Admitting: Family Medicine

## 2024-04-01 DIAGNOSIS — M62838 Other muscle spasm: Secondary | ICD-10-CM

## 2024-04-01 DIAGNOSIS — E66811 Obesity, class 1: Secondary | ICD-10-CM

## 2024-04-01 DIAGNOSIS — N289 Disorder of kidney and ureter, unspecified: Secondary | ICD-10-CM

## 2024-04-01 DIAGNOSIS — E669 Obesity, unspecified: Secondary | ICD-10-CM

## 2024-04-01 DIAGNOSIS — J449 Chronic obstructive pulmonary disease, unspecified: Secondary | ICD-10-CM

## 2024-04-07 ENCOUNTER — Ambulatory Visit (HOSPITAL_BASED_OUTPATIENT_CLINIC_OR_DEPARTMENT_OTHER)
Admission: RE | Admit: 2024-04-07 | Discharge: 2024-04-07 | Disposition: A | Source: Ambulatory Visit | Attending: Family Medicine | Admitting: Family Medicine

## 2024-04-07 ENCOUNTER — Encounter (HOSPITAL_BASED_OUTPATIENT_CLINIC_OR_DEPARTMENT_OTHER): Payer: Self-pay

## 2024-04-07 ENCOUNTER — Ambulatory Visit: Payer: Self-pay | Admitting: Family Medicine

## 2024-04-07 DIAGNOSIS — Z1231 Encounter for screening mammogram for malignant neoplasm of breast: Secondary | ICD-10-CM

## 2024-04-07 DIAGNOSIS — M8580 Other specified disorders of bone density and structure, unspecified site: Secondary | ICD-10-CM

## 2024-04-08 NOTE — Progress Notes (Signed)
 Called pt and no answer left message to return call.

## 2024-04-09 NOTE — Telephone Encounter (Signed)
 Copied from CRM #[id number]. Topic: Clinical - Lab/Test Results >> Apr 09, 2024  1:59 PM Taleah C wrote: Reason for CRM: pt is returning call from [facility] to go over Bone Density results. Please call & advise.

## 2024-04-09 NOTE — Progress Notes (Signed)
 Called pt and no answer left message to return call.

## 2024-06-21 ENCOUNTER — Encounter: Admitting: Student

## 2024-08-18 ENCOUNTER — Ambulatory Visit: Admitting: Pulmonary Disease

## 2024-11-09 ENCOUNTER — Ambulatory Visit
